# Patient Record
Sex: Male | Born: 1950 | Race: Black or African American | Hispanic: No | Marital: Married | Smoking: Former smoker
Health system: Southern US, Community
[De-identification: ages and names within clinical notes are randomized; demographics above are authoritative.]

## PROBLEM LIST (undated history)

## (undated) DIAGNOSIS — N400 Enlarged prostate without lower urinary tract symptoms: Secondary | ICD-10-CM

## (undated) DIAGNOSIS — K219 Gastro-esophageal reflux disease without esophagitis: Secondary | ICD-10-CM

## (undated) DIAGNOSIS — M109 Gout, unspecified: Secondary | ICD-10-CM

## (undated) DIAGNOSIS — F32A Depression, unspecified: Secondary | ICD-10-CM

## (undated) DIAGNOSIS — M1990 Unspecified osteoarthritis, unspecified site: Secondary | ICD-10-CM

## (undated) DIAGNOSIS — I10 Essential (primary) hypertension: Secondary | ICD-10-CM

## (undated) DIAGNOSIS — G4730 Sleep apnea, unspecified: Secondary | ICD-10-CM

## (undated) DIAGNOSIS — F329 Major depressive disorder, single episode, unspecified: Secondary | ICD-10-CM

## (undated) DIAGNOSIS — I8390 Asymptomatic varicose veins of unspecified lower extremity: Secondary | ICD-10-CM

## (undated) DIAGNOSIS — E7800 Pure hypercholesterolemia, unspecified: Secondary | ICD-10-CM

## (undated) DIAGNOSIS — C801 Malignant (primary) neoplasm, unspecified: Secondary | ICD-10-CM

## (undated) DIAGNOSIS — R42 Dizziness and giddiness: Secondary | ICD-10-CM

## (undated) HISTORY — DX: Depression, unspecified: F32.A

## (undated) HISTORY — PX: CATARACT EXTRACTION: SUR2

## (undated) HISTORY — PX: BLADDER SURGERY: SHX569

## (undated) HISTORY — DX: Gastro-esophageal reflux disease without esophagitis: K21.9

## (undated) HISTORY — PX: CARPAL TUNNEL RELEASE: SHX101

## (undated) HISTORY — DX: Major depressive disorder, single episode, unspecified: F32.9

## (undated) HISTORY — PX: COLONOSCOPY: SHX174

---

## 2003-08-19 ENCOUNTER — Emergency Department (HOSPITAL_COMMUNITY): Admission: EM | Admit: 2003-08-19 | Discharge: 2003-08-19 | Payer: Self-pay | Admitting: *Deleted

## 2012-11-11 ENCOUNTER — Emergency Department (HOSPITAL_COMMUNITY): Payer: Federal, State, Local not specified - PPO

## 2012-11-11 ENCOUNTER — Encounter (HOSPITAL_COMMUNITY): Payer: Self-pay | Admitting: *Deleted

## 2012-11-11 ENCOUNTER — Emergency Department (HOSPITAL_COMMUNITY)
Admission: EM | Admit: 2012-11-11 | Discharge: 2012-11-11 | Disposition: A | Discharge status: Home / Self Care | Payer: Federal, State, Local not specified - PPO | Attending: Emergency Medicine | Admitting: Emergency Medicine

## 2012-11-11 DIAGNOSIS — K219 Gastro-esophageal reflux disease without esophagitis: Secondary | ICD-10-CM | POA: Insufficient documentation

## 2012-11-11 DIAGNOSIS — Z862 Personal history of diseases of the blood and blood-forming organs and certain disorders involving the immune mechanism: Secondary | ICD-10-CM | POA: Insufficient documentation

## 2012-11-11 DIAGNOSIS — Z79899 Other long term (current) drug therapy: Secondary | ICD-10-CM | POA: Insufficient documentation

## 2012-11-11 DIAGNOSIS — Z8669 Personal history of other diseases of the nervous system and sense organs: Secondary | ICD-10-CM | POA: Insufficient documentation

## 2012-11-11 DIAGNOSIS — Z8639 Personal history of other endocrine, nutritional and metabolic disease: Secondary | ICD-10-CM | POA: Insufficient documentation

## 2012-11-11 DIAGNOSIS — I10 Essential (primary) hypertension: Secondary | ICD-10-CM | POA: Insufficient documentation

## 2012-11-11 DIAGNOSIS — Z87891 Personal history of nicotine dependence: Secondary | ICD-10-CM | POA: Insufficient documentation

## 2012-11-11 HISTORY — DX: Essential (primary) hypertension: I10

## 2012-11-11 HISTORY — DX: Pure hypercholesterolemia, unspecified: E78.00

## 2012-11-11 HISTORY — DX: Sleep apnea, unspecified: G47.30

## 2012-11-11 LAB — COMPREHENSIVE METABOLIC PANEL
ALT: <5 U/L (ref 0–53)
AST: 15 U/L (ref 0–37)
Albumin: 3.3 g/dL — ABNORMAL LOW (ref 3.5–5.2)
Alkaline Phosphatase: 96 U/L (ref 39–117)
BUN: 11 mg/dL (ref 6–23)
CO2: 22 mEq/L (ref 19–32)
Calcium: 9.3 mg/dL (ref 8.4–10.5)
Chloride: 100 mEq/L (ref 96–112)
Creatinine, Ser: 1.05 mg/dL (ref 0.50–1.35)
GFR calc Af Amer: 87 mL/min — ABNORMAL LOW (ref >90)
GFR calc non Af Amer: 75 mL/min — ABNORMAL LOW (ref >90)
Glucose, Bld: 111 mg/dL — ABNORMAL HIGH (ref 70–99)
Potassium: 4.4 mEq/L (ref 3.5–5.1)
Sodium: 135 mEq/L (ref 135–145)
Total Bilirubin: 0.3 mg/dL (ref 0.3–1.2)
Total Protein: 7.7 g/dL (ref 6.0–8.3)

## 2012-11-11 LAB — CBC WITH DIFFERENTIAL/PLATELET
Basophils Absolute: 0 10*3/uL (ref 0.0–0.1)
Basophils Relative: 0 % (ref 0–1)
Eosinophils Absolute: 0.2 10*3/uL (ref 0.0–0.7)
Eosinophils Relative: 1 % (ref 0–5)
HCT: 40 % (ref 39.0–52.0)
Hemoglobin: 13.7 g/dL (ref 13.0–17.0)
Lymphocytes Relative: 16 % (ref 12–46)
Lymphs Abs: 2 10*3/uL (ref 0.7–4.0)
MCH: 30 pg (ref 26.0–34.0)
MCHC: 34.3 g/dL (ref 30.0–36.0)
MCV: 87.7 fL (ref 78.0–100.0)
Monocytes Absolute: 0.9 10*3/uL (ref 0.1–1.0)
Monocytes Relative: 7 % (ref 3–12)
Neutro Abs: 9.7 10*3/uL — ABNORMAL HIGH (ref 1.7–7.7)
Neutrophils Relative %: 75 % (ref 43–77)
Platelets: 270 10*3/uL (ref 150–400)
RBC: 4.56 MIL/uL (ref 4.22–5.81)
RDW: 13.8 % (ref 11.5–15.5)
WBC: 12.9 10*3/uL — ABNORMAL HIGH (ref 4.0–10.5)

## 2012-11-11 LAB — LIPASE, BLOOD: Lipase: 46 U/L (ref 11–59)

## 2012-11-11 MED ORDER — SODIUM CHLORIDE 0.9 % IV BOLUS (SEPSIS)
500.0000 mL | Freq: Once | INTRAVENOUS | Status: AC
  Administered 2012-11-11: 500 mL via INTRAVENOUS

## 2012-11-11 MED ORDER — GI COCKTAIL ~~LOC~~
30.0000 mL | Freq: Once | ORAL | Status: AC
  Administered 2012-11-11: 30 mL via ORAL
  Filled 2012-11-11: qty 30

## 2012-11-11 MED ORDER — SUCRALFATE 1 G PO TABS: 1.0000 g | ORAL_TABLET | Freq: Four times a day (QID) | ORAL | Status: DC

## 2012-11-11 MED ORDER — PANTOPRAZOLE SODIUM 40 MG IV SOLR
40.0000 mg | Freq: Once | INTRAVENOUS | Status: AC
  Administered 2012-11-11: 40 mg via INTRAVENOUS
  Filled 2012-11-11: qty 40

## 2012-11-11 MED ORDER — FENTANYL CITRATE 0.05 MG/ML IJ SOLN
100.0000 ug | Freq: Once | INTRAMUSCULAR | Status: AC
  Administered 2012-11-11: 100 ug via INTRAVENOUS
  Filled 2012-11-11: qty 2

## 2012-11-11 MED ORDER — SUCRALFATE 1 G PO TABS
1.0000 g | ORAL_TABLET | Freq: Once | ORAL | Status: AC
  Administered 2012-11-11: 1 g via ORAL
  Filled 2012-11-11: qty 1

## 2012-11-11 MED ORDER — PANTOPRAZOLE SODIUM 20 MG PO TBEC: 40.0000 mg | DELAYED_RELEASE_TABLET | Freq: Every day | ORAL | Status: DC

## 2012-11-11 NOTE — ED Provider Notes (Signed)
CSN: [id number]     Arrival date & time 11/11/12  0208 History     First MD Initiated Contact with Patient 11/11/12 [phone]     Chief Complaint  Patient presents with  . Heartburn   (Consider location/radiation/quality/duration/timing/severity/associated sxs/prior Treatment) HPI This is a 62 year old male with a one-week history of epigastric pain that has worsened over the past 2 days. He rates the pain an 8/10 at rest but worse with movement or palpation. His pain is improved with supine, worse when sitting upright. It is exacerbated by eating or drinking anything, even water. He has not been able to eat or drink anything for the past 2 days. He took laxatives with successful purging of his bowels without relief of his pain. He describes the pain as a burning pain and states he has a history of similar pain due to gastroesophageal reflux. He has not been nauseated or vomiting.  Past Medical History  Diagnosis Date  . Hypertension   . High cholesterol   . Sleep apnea    History reviewed. No pertinent past surgical history. History reviewed. No pertinent family history. History  Substance Use Topics  . Smoking status: Former Games developer  . Smokeless tobacco: Not on file  . Alcohol Use: No    Review of Systems  All other systems reviewed and are negative.    Allergies  Review of patient's allergies indicates no known allergies.  Home Medications   Current Outpatient Rx  Name  Route  Sig  Dispense  Refill  . amLODipine-benazepril (LOTREL) 5-10 MG per capsule   Oral   Take 1 capsule by mouth daily.         Marland Kitchen FLUoxetine (PROZAC) 40 MG capsule   Oral   Take 40 mg by mouth every morning.         . furosemide (LASIX) 40 MG tablet   Oral   Take 40 mg by mouth daily.         . tamsulosin (FLOMAX) 0.4 MG CAPS capsule   Oral   Take 0.4 mg by mouth daily after supper.         . venlafaxine (EFFEXOR) 37.5 MG tablet   Oral   Take 75 mg by mouth daily.          BP  131/73  Pulse 71  Temp(Src) 98.9 F (37.2 C) (Oral)  Resp 15  SpO2 95%  Physical Exam General: Well-developed, well-nourished male in no acute distress; appearance consistent with age of record HENT: normocephalic, atraumatic Eyes: pupils equal round and reactive to light; extraocular muscles intact Neck: supple Heart: regular rate and rhythm Lungs: clear to auscultation bilaterally Abdomen: soft; nondistended; upper abdominal tenderness most prominent in the epigastrium; no masses or hepatosplenomegaly; bowel sounds present Extremities: No deformity; full range of motion Neurologic: Awake, alert and oriented; motor function intact in all extremities and symmetric; no facial droop Skin: Warm and dry Psychiatric: Normal mood and affect    ED Course   Procedures (including critical care time)    MDM   Nursing notes and vitals signs, including pulse oximetry, reviewed.  Summary of this visit's results, reviewed by myself:  Labs:  Results for orders placed during the hospital encounter of 11/11/12 (from the past 24 hour(s))  COMPREHENSIVE METABOLIC PANEL     Status: Abnormal   Collection Time    11/11/12  3:44 AM      Result Value Range   Sodium 135  135 - 145 mEq/L   Potassium  4.4  3.5 - 5.1 mEq/L   Chloride 100  96 - 112 mEq/L   CO2 22  19 - 32 mEq/L   Glucose, Bld 111 (*) 70 - 99 mg/dL   BUN 11  6 - 23 mg/dL   Creatinine, Ser 0.86  0.50 - 1.35 mg/dL   Calcium 9.3  8.4 - 57.8 mg/dL   Total Protein 7.7  6.0 - 8.3 g/dL   Albumin 3.3 (*) 3.5 - 5.2 g/dL   AST 15  0 - 37 U/L   ALT <5  0 - 53 U/L   Alkaline Phosphatase 96  39 - 117 U/L   Total Bilirubin 0.3  0.3 - 1.2 mg/dL   GFR calc non Af Amer 75 (*) >90 mL/min   GFR calc Af Amer 87 (*) >90 mL/min  LIPASE, BLOOD     Status: None   Collection Time    11/11/12  3:44 AM      Result Value Range   Lipase 46  11 - 59 U/L  CBC WITH DIFFERENTIAL     Status: Abnormal   Collection Time    11/11/12  3:44 AM      Result  Value Range   WBC 12.9 (*) 4.0 - 10.5 K/uL   RBC 4.56  4.22 - 5.81 MIL/uL   Hemoglobin 13.7  13.0 - 17.0 g/dL   HCT 46.9  62.9 - 52.8 %   MCV 87.7  78.0 - 100.0 fL   MCH 30.0  26.0 - 34.0 pg   MCHC 34.3  30.0 - 36.0 g/dL   RDW 41.3  24.4 - 01.0 %   Platelets 270  150 - 400 K/uL   Neutrophils Relative % 75  43 - 77 %   Neutro Abs 9.7 (*) 1.7 - 7.7 K/uL   Lymphocytes Relative 16  12 - 46 %   Lymphs Abs 2.0  0.7 - 4.0 K/uL   Monocytes Relative 7  3 - 12 %   Monocytes Absolute 0.9  0.1 - 1.0 K/uL   Eosinophils Relative 1  0 - 5 %   Eosinophils Absolute 0.2  0.0 - 0.7 K/uL   Basophils Relative 0  0 - 1 %   Basophils Absolute 0.0  0.0 - 0.1 K/uL    Imaging Studies: Dg Chest 2 View  11/11/2012   *RADIOLOGY REPORT*  Clinical Data: Chest pain.  CHEST - 2 VIEW  Comparison: No priors.  Findings: Lung volumes are normal.  No consolidative airspace disease.  No pleural effusions.  No pneumothorax.  No pulmonary nodule or mass noted.  Pulmonary vasculature and the cardiomediastinal silhouette are within normal limits. Atherosclerosis in the thoracic aorta.  IMPRESSION: 1. No radiographic evidence of acute cardiopulmonary disease. 2.  Atherosclerosis.   Original Report Authenticated By: Trudie Reed, M.D.   6:48 AM Patient feeling better after IV medications and sucralfate and GI cocktail. The patient's history is consistent with gastroesophageal reflux disease. He states he was supposed to have an upper endoscopy year ago but was lost to followup. We will start him on a proton pump inhibitor and Carafate and have him followup with Dr. Loreta Ave, his gastroenterologist.   Hanley Seamen, MD 11/11/12 [phone]

## 2012-11-11 NOTE — ED Notes (Signed)
Pt states that he is having pain in his epigastric region for the past week. Pt states that he cannot drink water without having pain. Pt states that the pain wraps around under his ribs.

## 2012-11-12 ENCOUNTER — Other Ambulatory Visit: Payer: Self-pay | Admitting: Gastroenterology

## 2012-11-12 DIAGNOSIS — R1013 Epigastric pain: Secondary | ICD-10-CM

## 2012-11-13 ENCOUNTER — Ambulatory Visit (HOSPITAL_COMMUNITY)
Admission: RE | Admit: 2012-11-13 | Discharge: 2012-11-13 | Disposition: A | Discharge status: Home / Self Care | Payer: Federal, State, Local not specified - PPO | Source: Ambulatory Visit | Attending: Gastroenterology | Admitting: Gastroenterology

## 2012-11-13 ENCOUNTER — Other Ambulatory Visit (HOSPITAL_COMMUNITY): Payer: Self-pay | Admitting: Cardiology

## 2012-11-13 DIAGNOSIS — R079 Chest pain, unspecified: Secondary | ICD-10-CM

## 2012-11-13 DIAGNOSIS — K8020 Calculus of gallbladder without cholecystitis without obstruction: Secondary | ICD-10-CM | POA: Insufficient documentation

## 2012-11-13 DIAGNOSIS — R1013 Epigastric pain: Secondary | ICD-10-CM | POA: Insufficient documentation

## 2012-11-14 ENCOUNTER — Encounter (INDEPENDENT_AMBULATORY_CARE_PROVIDER_SITE_OTHER): Payer: Self-pay

## 2012-11-14 ENCOUNTER — Encounter (HOSPITAL_COMMUNITY)
Admission: RE | Admit: 2012-11-14 | Discharge: 2012-11-14 | Disposition: A | Discharge status: Home / Self Care | Payer: Federal, State, Local not specified - PPO | Source: Ambulatory Visit | Attending: Cardiology | Admitting: Cardiology

## 2012-11-14 DIAGNOSIS — R079 Chest pain, unspecified: Secondary | ICD-10-CM | POA: Insufficient documentation

## 2012-11-14 LAB — BASIC METABOLIC PANEL
BUN: 11 mg/dL (ref 6–23)
CO2: 26 mEq/L (ref 19–32)
Calcium: 9.8 mg/dL (ref 8.4–10.5)
Chloride: 101 mEq/L (ref 96–112)
Creatinine, Ser: 1.08 mg/dL (ref 0.50–1.35)
GFR calc Af Amer: 84 mL/min — ABNORMAL LOW (ref >90)
GFR calc non Af Amer: 73 mL/min — ABNORMAL LOW (ref >90)
Glucose, Bld: 99 mg/dL (ref 70–99)
Potassium: 4.3 mEq/L (ref 3.5–5.1)
Sodium: 138 mEq/L (ref 135–145)

## 2012-11-14 LAB — HEPATIC FUNCTION PANEL
ALT: <5 U/L (ref 0–53)
AST: 14 U/L (ref 0–37)
Albumin: 3.6 g/dL (ref 3.5–5.2)
Alkaline Phosphatase: 96 U/L (ref 39–117)
Bilirubin, Direct: <0.1 mg/dL (ref 0.0–0.3)
Total Bilirubin: 0.2 mg/dL — ABNORMAL LOW (ref 0.3–1.2)
Total Protein: 7.5 g/dL (ref 6.0–8.3)

## 2012-11-14 LAB — LIPID PANEL
Cholesterol: 204 mg/dL — ABNORMAL HIGH (ref 0–200)
HDL: 56 mg/dL (ref >39)
LDL Cholesterol: 127 mg/dL — ABNORMAL HIGH (ref 0–99)
Total CHOL/HDL Ratio: 3.6 RATIO
Triglycerides: 107 mg/dL (ref <150)
VLDL: 21 mg/dL (ref 0–40)

## 2012-11-15 ENCOUNTER — Other Ambulatory Visit: Payer: Self-pay

## 2012-11-15 ENCOUNTER — Encounter (HOSPITAL_COMMUNITY): Payer: Self-pay | Admitting: Pharmacy Technician

## 2012-11-15 ENCOUNTER — Encounter (INDEPENDENT_AMBULATORY_CARE_PROVIDER_SITE_OTHER): Payer: Self-pay | Admitting: General Surgery

## 2012-11-15 ENCOUNTER — Encounter (HOSPITAL_COMMUNITY)
Admission: RE | Admit: 2012-11-15 | Discharge: 2012-11-15 | Disposition: A | Discharge status: Home / Self Care | Payer: Federal, State, Local not specified - PPO | Source: Ambulatory Visit | Attending: Cardiology | Admitting: Cardiology

## 2012-11-15 ENCOUNTER — Ambulatory Visit (INDEPENDENT_AMBULATORY_CARE_PROVIDER_SITE_OTHER): Payer: Federal, State, Local not specified - PPO | Admitting: General Surgery

## 2012-11-15 VITALS — BP 135/66 | HR 88

## 2012-11-15 VITALS — BP 138/82 | HR 82 | Resp 18 | Ht 67.0 in | Wt 323.0 lb

## 2012-11-15 DIAGNOSIS — K8020 Calculus of gallbladder without cholecystitis without obstruction: Secondary | ICD-10-CM

## 2012-11-15 DIAGNOSIS — R079 Chest pain, unspecified: Secondary | ICD-10-CM

## 2012-11-15 MED ORDER — TECHNETIUM TC 99M SESTAMIBI GENERIC - CARDIOLITE: 30.0000 | Freq: Once | INTRAVENOUS | Status: AC | PRN

## 2012-11-15 MED ORDER — TECHNETIUM TC 99M SESTAMIBI GENERIC - CARDIOLITE
30.0000 | Freq: Once | INTRAVENOUS | Status: AC | PRN
  Administered 2012-11-15: 30 via INTRAVENOUS

## 2012-11-15 MED ORDER — REGADENOSON 0.4 MG/5ML IV SOLN
0.4000 mg | Freq: Once | INTRAVENOUS | Status: AC
  Administered 2012-11-15: 0.4 mg via INTRAVENOUS

## 2012-11-15 NOTE — H&P (Signed)
Eric Cruz is an 62 y.o. male.   Chief Complaint: Lower chest wall pain and upper abdominal pain to the right side. HPI: The patient states that he's had this discomfort over the last several years worsening over the last 2-3 weeks. He went to the emergency room approximately 5 days ago when he was seen early in the morning for this upper abdominal and lower chest wall pain. There diagnoses at that time was ulcer disease. He was referred to his gastroenterologist and subsequently felt as though the symptoms mimic those of patients with gallbladder disease. Because of these findings the patient was sent for an ultrasound which demonstrated that the patient does have gallstones but no evidence of acute cholecystitis. With these findings the patient was referred to surgery for consideration of cholecystectomy.  The patient describes the symptoms as postprandial after things like pizza, hamburgers, and fried chicken. He describes the pain as initiating as food is going down his esophagus and seems to get hung up in the mid chest and the pain starts. This sounds more like some type of distal esophageal discomfort however there are certain foods that make it worse than others. The pain does seem to calm on the right side and occur after eating foods.  Past Medical History  Diagnosis Date  . Hypertension   . High cholesterol   . Sleep apnea   . Depression   . GERD (gastroesophageal reflux disease)     History reviewed. No pertinent past surgical history.  History reviewed. No pertinent family history. Social History:  reports that he has quit smoking. He does not have any smokeless tobacco history on file. He reports that he does not drink alcohol or use illicit drugs.  Allergies: No Known Allergies   (Not in a hospital admission)  Results for orders placed during the hospital encounter of 11/14/12 (from the past 48 hour(s))  LIPID PANEL     Status: Abnormal   Collection Time    11/14/12  12:08 PM      Result Value Range   Cholesterol 204 (*) 0 - 200 mg/dL   Triglycerides 161  <096 mg/dL   HDL 56  >04 mg/dL   Total CHOL/HDL Ratio 3.6     VLDL 21  0 - 40 mg/dL   LDL Cholesterol 540 (*) 0 - 99 mg/dL   Comment:            Total Cholesterol/HDL:CHD Risk     Coronary Heart Disease Risk Table                         Men   Women      1/2 Average Risk   3.4   3.3      Average Risk       5.0   4.4      2 X Average Risk   9.6   7.1      3 X Average Risk  23.4   11.0                Use the calculated Patient Ratio     above and the CHD Risk Table     to determine the patient's CHD Risk.                ATP III CLASSIFICATION (LDL):      <100     mg/dL   Optimal      981-191  mg/dL   Near  or Above                        Optimal      130-159  mg/dL   Borderline      782-956  mg/dL   High      >213     mg/dL   Very High  HEPATIC FUNCTION PANEL     Status: Abnormal   Collection Time    11/14/12 12:08 PM      Result Value Range   Total Protein 7.5  6.0 - 8.3 g/dL   Albumin 3.6  3.5 - 5.2 g/dL   AST 14  0 - 37 U/L   ALT <5  0 - 53 U/L   Alkaline Phosphatase 96  39 - 117 U/L   Total Bilirubin 0.2 (*) 0.3 - 1.2 mg/dL   Bilirubin, Direct <0.8  0.0 - 0.3 mg/dL   Indirect Bilirubin NOT CALCULATED  0.3 - 0.9 mg/dL  BASIC METABOLIC PANEL     Status: Abnormal   Collection Time    11/14/12 12:08 PM      Result Value Range   Sodium 138  135 - 145 mEq/L   Potassium 4.3  3.5 - 5.1 mEq/L   Chloride 101  96 - 112 mEq/L   CO2 26  19 - 32 mEq/L   Glucose, Bld 99  70 - 99 mg/dL   BUN 11  6 - 23 mg/dL   Creatinine, Ser 6.57  0.50 - 1.35 mg/dL   Calcium 9.8  8.4 - 84.6 mg/dL   GFR calc non Af Amer 73 (*) >90 mL/min   GFR calc Af Amer 84 (*) >90 mL/min   Comment: (NOTE)     The eGFR has been calculated using the CKD EPI equation.     This calculation has not been validated in all clinical situations.     eGFR's persistently <90 mL/min signify possible Chronic Kidney     Disease.    Nm Myocar Multi W/spect W/wall Motion / Ef  11/15/2012   *RADIOLOGY REPORT*  Clinical Data:  Chest pain.  MYOCARDIAL IMAGING WITH SPECT (REST AND PHARMACOLOGIC-STRESS - 2 DAY PROTOCOL) GATED LEFT VENTRICULAR WALL MOTION STUDY LEFT VENTRICULAR EJECTION FRACTION  Technique:  Standard myocardial SPECT imaging was performed after intravenous injection of 10 mCi Tc-33m sestamibi at rest.  On a different day, intravenous infusion of  regadenoson was performed under supervision of the Cardiology staff.  At peak effect of the drug, 30 mCi Tc-50m sestamibi was injected intravenously and standard myocardial SPECT imaging was performed.  Quantitative gated imaging was also performed to evaluate left ventricular wall motion and estimate left ventricular ejection fraction.  Comparison:  None.  Findings: The left ventricle myocardial perfusion is within normal limits.  There is slightly decreased uptake along the base of the inferior wall on the stress images but this is thought to be related to adjacent gut activity.  The wall motion is normal.  The calculated ejection fraction is 63%.  Estimated diastolic volume is 107 ml and estimated systolic volume is 40 ml.  IMPRESSION: No evidence for pharmacologically induced ischemia.  Calculated ejection fraction is 63%.   Original Report Authenticated By: Richarda Overlie, M.D.    Review of Systems  Constitutional: Negative for fever and chills.  HENT: Negative.   Eyes: Negative.   Respiratory: Negative.   Cardiovascular: Positive for chest pain (lower chest wall with eating). Negative for palpitations.  Gastrointestinal: Positive for heartburn,  nausea, abdominal pain and diarrhea (incontinence).  Genitourinary: Positive for urgency (incontinence).  Musculoskeletal: Negative.   Skin: Negative.   Neurological: Negative.   Endo/Heme/Allergies: Negative.   Psychiatric/Behavioral: Negative.     Blood pressure 138/82, pulse 82, resp. rate 18, height 5\' 7"  (1.702 m), weight  323 lb (146.512 kg). Physical Exam  Constitutional: He is oriented to person, place, and time. He appears well-nourished.  Obese  HENT:  Head: Normocephalic and atraumatic.  Eyes: Conjunctivae and EOM are normal. Pupils are equal, round, and reactive to light.  Neck: Normal range of motion. Neck supple.  Cardiovascular: Normal rate, regular rhythm and normal heart sounds.   Respiratory: Effort normal and breath sounds normal.  GI: Soft. Bowel sounds are normal. There is tenderness (mild RUQ tenderness).  Musculoskeletal: Normal range of motion.  Neurological: He is alert and oriented to person, place, and time. He has normal reflexes.  Skin: Skin is warm and dry.  Psychiatric: He has a normal mood and affect. His behavior is normal. Judgment and thought content normal.     Assessment/Plan Symptomatic cholelithiasis, but patient may have other problems giving him his lower chest wall pain.  It hurts when you push on his lower chest wall.  He had a stress test which is pending.  Unless his stress test comes back positive I will go ahead with Lap Chole with IOC.  He should get an EGD in the future--his last one was in 2011  Cherylynn Ridges 11/15/2012, 2:15 PM

## 2012-11-15 NOTE — Progress Notes (Signed)
This patient's office visit was dictated and recorded as a preoperative history and physical for future surgery.

## 2012-11-18 ENCOUNTER — Encounter (HOSPITAL_COMMUNITY): Payer: Federal, State, Local not specified - PPO

## 2012-11-20 NOTE — Pre-Procedure Instructions (Signed)
Eric Cruz  11/20/2012   Your procedure is scheduled on: Friday, November 22, 2012  Report to [facility] at 5:30 AM.  Call this number if you have problems the morning of surgery: [phone]   Remember:   Do not eat food or drink liquids after midnight.   Take these medicines the morning of surgery with A SIP OF WATER: FLUoxetine (PROZAC) 40 MG capsule, pantoprazole (PROTONIX) 20 MG tablet, sucralfate (CARAFATE) 1 G tablet, venlafaxine (EFFEXOR) 37.5 MG tablet     Do not wear jewelry, make-up or nail polish.  Do not wear lotions, powders, or perfumes. You may wear deodorant.  Do not shave 48 hours prior to surgery. Men may shave face and neck.  Do not bring valuables to the hospital.  [facility] is not responsible for any belongings or valuables.  Contacts, dentures or bridgework may not be worn into surgery.  Leave suitcase in the car. After surgery it may be brought to your room.  For patients admitted to the hospital, checkout time is 11:00 AM the day of discharge.   Patients discharged the day of surgery will not be allowed to drive home.    Special Instructions: Shower using CHG 2 nights before surgery and the night before surgery.  If you shower the day of surgery use CHG.  Use special wash - you have one bottle of CHG for all showers.  You should use approximately 1/3 of the bottle for each shower.   Please read over the following fact sheets that you were given: Pain Booklet, Coughing and Deep Breathing and Surgical Site Infection Prevention

## 2012-11-21 ENCOUNTER — Encounter (HOSPITAL_COMMUNITY): Payer: Self-pay

## 2012-11-21 ENCOUNTER — Encounter (HOSPITAL_COMMUNITY)
Admission: RE | Admit: 2012-11-21 | Discharge: 2012-11-21 | Disposition: A | Discharge status: Home / Self Care | Payer: Federal, State, Local not specified - PPO | Source: Ambulatory Visit | Attending: General Surgery | Admitting: General Surgery

## 2012-11-21 HISTORY — DX: Gout, unspecified: M10.9

## 2012-11-21 HISTORY — DX: Dizziness and giddiness: R42

## 2012-11-21 HISTORY — DX: Benign prostatic hyperplasia without lower urinary tract symptoms: N40.0

## 2012-11-21 HISTORY — DX: Unspecified osteoarthritis, unspecified site: M19.90

## 2012-11-21 LAB — CBC WITH DIFFERENTIAL/PLATELET
Basophils Absolute: 0 10*3/uL (ref 0.0–0.1)
Basophils Relative: 0 % (ref 0–1)
Eosinophils Absolute: 0.1 10*3/uL (ref 0.0–0.7)
Eosinophils Relative: 1 % (ref 0–5)
HCT: 38.9 % — ABNORMAL LOW (ref 39.0–52.0)
Hemoglobin: 13.2 g/dL (ref 13.0–17.0)
Lymphocytes Relative: 22 % (ref 12–46)
Lymphs Abs: 1.9 10*3/uL (ref 0.7–4.0)
MCH: 29.4 pg (ref 26.0–34.0)
MCHC: 33.9 g/dL (ref 30.0–36.0)
MCV: 86.6 fL (ref 78.0–100.0)
Monocytes Absolute: 0.7 10*3/uL (ref 0.1–1.0)
Monocytes Relative: 8 % (ref 3–12)
Neutro Abs: 5.8 10*3/uL (ref 1.7–7.7)
Neutrophils Relative %: 69 % (ref 43–77)
Platelets: 263 10*3/uL (ref 150–400)
RBC: 4.49 MIL/uL (ref 4.22–5.81)
RDW: 13.8 % (ref 11.5–15.5)
WBC: 8.4 10*3/uL (ref 4.0–10.5)

## 2012-11-21 LAB — COMPREHENSIVE METABOLIC PANEL
ALT: 5 U/L (ref 0–53)
AST: 12 U/L (ref 0–37)
Albumin: 3.4 g/dL — ABNORMAL LOW (ref 3.5–5.2)
Alkaline Phosphatase: 77 U/L (ref 39–117)
BUN: 13 mg/dL (ref 6–23)
CO2: 25 mEq/L (ref 19–32)
Calcium: 9.7 mg/dL (ref 8.4–10.5)
Chloride: 102 mEq/L (ref 96–112)
Creatinine, Ser: 0.98 mg/dL (ref 0.50–1.35)
GFR calc Af Amer: >90 mL/min (ref >90)
GFR calc non Af Amer: 88 mL/min — ABNORMAL LOW (ref >90)
Glucose, Bld: 94 mg/dL (ref 70–99)
Potassium: 3.8 mEq/L (ref 3.5–5.1)
Sodium: 137 mEq/L (ref 135–145)
Total Bilirubin: 0.2 mg/dL — ABNORMAL LOW (ref 0.3–1.2)
Total Protein: 7.5 g/dL (ref 6.0–8.3)

## 2012-11-21 MED ORDER — DEXTROSE 5 % IV SOLN: 2.0000 g | INTRAVENOUS | Status: DC

## 2012-11-21 MED ORDER — CEFOXITIN SODIUM 2 G IV SOLR
2.0000 g | INTRAVENOUS | Status: AC
  Administered 2012-11-22: 2 g via INTRAVENOUS
  Filled 2012-11-21: qty 2

## 2012-11-21 NOTE — Progress Notes (Signed)
Primary physician - Dr. Nehemiah Settle Cardiologist - dr. Sharyn Lull Stress test, ekg in epic from aug 2014

## 2012-11-22 ENCOUNTER — Encounter (HOSPITAL_COMMUNITY): Payer: Self-pay | Admitting: Anesthesiology

## 2012-11-22 ENCOUNTER — Encounter (HOSPITAL_COMMUNITY): Payer: Self-pay | Admitting: *Deleted

## 2012-11-22 ENCOUNTER — Ambulatory Visit (HOSPITAL_COMMUNITY): Payer: Federal, State, Local not specified - PPO | Admitting: Anesthesiology

## 2012-11-22 ENCOUNTER — Encounter (HOSPITAL_COMMUNITY)
Admission: RE | Disposition: A | Discharge status: Home / Self Care | Payer: Self-pay | Source: Ambulatory Visit | Attending: General Surgery

## 2012-11-22 ENCOUNTER — Ambulatory Visit (HOSPITAL_COMMUNITY)
Admission: RE | Admit: 2012-11-22 | Discharge: 2012-11-23 | Disposition: A | Discharge status: Home / Self Care | Payer: Federal, State, Local not specified - PPO | Source: Ambulatory Visit | Attending: General Surgery | Admitting: General Surgery

## 2012-11-22 DIAGNOSIS — F329 Major depressive disorder, single episode, unspecified: Secondary | ICD-10-CM | POA: Insufficient documentation

## 2012-11-22 DIAGNOSIS — G4730 Sleep apnea, unspecified: Secondary | ICD-10-CM | POA: Insufficient documentation

## 2012-11-22 DIAGNOSIS — Z01812 Encounter for preprocedural laboratory examination: Secondary | ICD-10-CM | POA: Insufficient documentation

## 2012-11-22 DIAGNOSIS — K219 Gastro-esophageal reflux disease without esophagitis: Secondary | ICD-10-CM | POA: Insufficient documentation

## 2012-11-22 DIAGNOSIS — K8010 Calculus of gallbladder with chronic cholecystitis without obstruction: Secondary | ICD-10-CM

## 2012-11-22 DIAGNOSIS — E785 Hyperlipidemia, unspecified: Secondary | ICD-10-CM | POA: Insufficient documentation

## 2012-11-22 DIAGNOSIS — F3289 Other specified depressive episodes: Secondary | ICD-10-CM | POA: Insufficient documentation

## 2012-11-22 DIAGNOSIS — K8020 Calculus of gallbladder without cholecystitis without obstruction: Secondary | ICD-10-CM

## 2012-11-22 DIAGNOSIS — I10 Essential (primary) hypertension: Secondary | ICD-10-CM | POA: Insufficient documentation

## 2012-11-22 DIAGNOSIS — Z79899 Other long term (current) drug therapy: Secondary | ICD-10-CM | POA: Insufficient documentation

## 2012-11-22 HISTORY — DX: Asymptomatic varicose veins of unspecified lower extremity: I83.90

## 2012-11-22 HISTORY — PX: CHOLECYSTECTOMY: SHX55

## 2012-11-22 SURGERY — LAPAROSCOPIC CHOLECYSTECTOMY
Anesthesia: General | Site: Abdomen | Wound class: Clean Contaminated

## 2012-11-22 MED ORDER — NEOSTIGMINE METHYLSULFATE 1 MG/ML IJ SOLN
INTRAMUSCULAR | Status: DC | PRN
  Administered 2012-11-22: 4 mg via INTRAVENOUS

## 2012-11-22 MED ORDER — TAMSULOSIN HCL 0.4 MG PO CAPS
0.4000 mg | ORAL_CAPSULE | Freq: Every day | ORAL | Status: DC
  Filled 2012-11-22: qty 1

## 2012-11-22 MED ORDER — BENAZEPRIL HCL 10 MG PO TABS
10.0000 mg | ORAL_TABLET | Freq: Every day | ORAL | Status: DC
  Filled 2012-11-22 (×2): qty 1

## 2012-11-22 MED ORDER — ONDANSETRON HCL 4 MG/2ML IJ SOLN: 4.0000 mg | Freq: Once | INTRAMUSCULAR | Status: DC | PRN

## 2012-11-22 MED ORDER — GLYCOPYRROLATE 0.2 MG/ML IJ SOLN
INTRAMUSCULAR | Status: DC | PRN
  Administered 2012-11-22: .8 mg via INTRAVENOUS

## 2012-11-22 MED ORDER — FLUOXETINE HCL 40 MG PO CAPS: 40.0000 mg | ORAL_CAPSULE | Freq: Every morning | ORAL | Status: DC

## 2012-11-22 MED ORDER — KCL IN DEXTROSE-NACL 20-5-0.45 MEQ/L-%-% IV SOLN
INTRAVENOUS | Status: DC
  Administered 2012-11-22: 12:00:00 via INTRAVENOUS
  Filled 2012-11-22 (×4): qty 1000

## 2012-11-22 MED ORDER — PANTOPRAZOLE SODIUM 40 MG PO TBEC
40.0000 mg | DELAYED_RELEASE_TABLET | Freq: Every day | ORAL | Status: DC
Start: 2012-11-22 — End: 2012-11-23
  Filled 2012-11-22 (×2): qty 1

## 2012-11-22 MED ORDER — FLUOXETINE HCL 20 MG PO CAPS
40.0000 mg | ORAL_CAPSULE | Freq: Every day | ORAL | Status: DC
  Filled 2012-11-22 (×2): qty 2

## 2012-11-22 MED ORDER — OXYCODONE-ACETAMINOPHEN 5-325 MG PO TABS: 1.0000 | ORAL_TABLET | ORAL | Status: DC | PRN

## 2012-11-22 MED ORDER — ACETAMINOPHEN 325 MG PO TABS: 650.0000 mg | ORAL_TABLET | ORAL | Status: DC | PRN

## 2012-11-22 MED ORDER — SUCRALFATE 1 G PO TABS
1.0000 g | ORAL_TABLET | Freq: Four times a day (QID) | ORAL | Status: DC
  Administered 2012-11-22 (×2): 1 g via ORAL
  Filled 2012-11-22 (×6): qty 1

## 2012-11-22 MED ORDER — OXYCODONE HCL 5 MG PO TABS: 5.0000 mg | ORAL_TABLET | Freq: Once | ORAL | Status: DC | PRN

## 2012-11-22 MED ORDER — PROPOFOL 10 MG/ML IV BOLUS
INTRAVENOUS | Status: DC | PRN
  Administered 2012-11-22: 200 mg via INTRAVENOUS

## 2012-11-22 MED ORDER — ROCURONIUM BROMIDE 100 MG/10ML IV SOLN
INTRAVENOUS | Status: DC | PRN
  Administered 2012-11-22: 50 mg via INTRAVENOUS

## 2012-11-22 MED ORDER — HYDROMORPHONE HCL PF 1 MG/ML IJ SOLN
INTRAMUSCULAR | Status: AC
  Administered 2012-11-22: 0.25 mg via INTRAVENOUS
  Filled 2012-11-22: qty 1

## 2012-11-22 MED ORDER — MIDAZOLAM HCL 5 MG/5ML IJ SOLN
INTRAMUSCULAR | Status: DC | PRN
  Administered 2012-11-22 (×2): 1 mg via INTRAVENOUS

## 2012-11-22 MED ORDER — AMLODIPINE BESY-BENAZEPRIL HCL 5-10 MG PO CAPS: 1.0000 | ORAL_CAPSULE | Freq: Every day | ORAL | Status: DC

## 2012-11-22 MED ORDER — HYDROMORPHONE HCL PF 1 MG/ML IJ SOLN
0.2500 mg | INTRAMUSCULAR | Status: DC | PRN
  Administered 2012-11-22: 0.25 mg via INTRAVENOUS

## 2012-11-22 MED ORDER — ENOXAPARIN SODIUM 40 MG/0.4ML ~~LOC~~ SOLN
40.0000 mg | SUBCUTANEOUS | Status: DC
  Administered 2012-11-23: 40 mg via SUBCUTANEOUS
  Filled 2012-11-22 (×2): qty 0.4

## 2012-11-22 MED ORDER — LIDOCAINE HCL (CARDIAC) 20 MG/ML IV SOLN
INTRAVENOUS | Status: DC | PRN
  Administered 2012-11-22: 100 mg via INTRAVENOUS

## 2012-11-22 MED ORDER — LACTATED RINGERS IV SOLN
INTRAVENOUS | Status: DC | PRN
  Administered 2012-11-22 (×2): via INTRAVENOUS

## 2012-11-22 MED ORDER — ONDANSETRON HCL 4 MG PO TABS: 4.0000 mg | ORAL_TABLET | Freq: Four times a day (QID) | ORAL | Status: DC | PRN

## 2012-11-22 MED ORDER — AMLODIPINE BESYLATE 5 MG PO TABS
5.0000 mg | ORAL_TABLET | Freq: Every day | ORAL | Status: DC
  Filled 2012-11-22 (×2): qty 1

## 2012-11-22 MED ORDER — BUPIVACAINE-EPINEPHRINE PF 0.25-1:200000 % IJ SOLN
INTRAMUSCULAR | Status: AC
  Filled 2012-11-22: qty 30

## 2012-11-22 MED ORDER — OXYCODONE HCL 5 MG/5ML PO SOLN: 5.0000 mg | Freq: Once | ORAL | Status: DC | PRN

## 2012-11-22 MED ORDER — FUROSEMIDE 40 MG PO TABS
40.0000 mg | ORAL_TABLET | Freq: Every day | ORAL | Status: DC
  Filled 2012-11-22 (×2): qty 1

## 2012-11-22 MED ORDER — 0.9 % SODIUM CHLORIDE (POUR BTL) OPTIME
TOPICAL | Status: DC | PRN
  Administered 2012-11-22: 1000 mL

## 2012-11-22 MED ORDER — VENLAFAXINE HCL 75 MG PO TABS
75.0000 mg | ORAL_TABLET | Freq: Every day | ORAL | Status: DC
  Filled 2012-11-22 (×2): qty 1

## 2012-11-22 MED ORDER — SODIUM CHLORIDE 0.9 % IR SOLN
Status: DC | PRN
  Administered 2012-11-22: 1

## 2012-11-22 MED ORDER — OXYCODONE-ACETAMINOPHEN 5-325 MG PO TABS
1.0000 | ORAL_TABLET | ORAL | Status: DC | PRN
  Administered 2012-11-22: 2 via ORAL
  Filled 2012-11-22: qty 2

## 2012-11-22 MED ORDER — LABETALOL HCL 5 MG/ML IV SOLN
INTRAVENOUS | Status: DC | PRN
  Administered 2012-11-22 (×4): 2.5 mg via INTRAVENOUS

## 2012-11-22 MED ORDER — BUPIVACAINE-EPINEPHRINE 0.25% -1:200000 IJ SOLN
INTRAMUSCULAR | Status: DC | PRN
  Administered 2012-11-22: 18 mL

## 2012-11-22 MED ORDER — ONDANSETRON HCL 4 MG/2ML IJ SOLN: 4.0000 mg | Freq: Four times a day (QID) | INTRAMUSCULAR | Status: DC | PRN

## 2012-11-22 MED ORDER — ONDANSETRON HCL 4 MG/2ML IJ SOLN
INTRAMUSCULAR | Status: DC | PRN
  Administered 2012-11-22: 4 mg via INTRAVENOUS

## 2012-11-22 MED ORDER — FENTANYL CITRATE 0.05 MG/ML IJ SOLN
INTRAMUSCULAR | Status: DC | PRN
  Administered 2012-11-22 (×7): 50 ug via INTRAVENOUS

## 2012-11-22 MED ORDER — HYDROMORPHONE HCL PF 1 MG/ML IJ SOLN: 1.0000 mg | INTRAMUSCULAR | Status: DC | PRN

## 2012-11-22 SURGICAL SUPPLY — 46 items
APPLIER CLIP 5 13 M/L LIGAMAX5 (MISCELLANEOUS) ×3
APPLIER CLIP ROT 10 11.4 M/L (STAPLE)
BLADE SURG ROTATE 9660 (MISCELLANEOUS) IMPLANT
CANISTER SUCTION 2500CC (MISCELLANEOUS) ×3 IMPLANT
CHLORAPREP W/TINT 26ML (MISCELLANEOUS) ×3 IMPLANT
CLIP APPLIE 5 13 M/L LIGAMAX5 (MISCELLANEOUS) ×2 IMPLANT
CLIP APPLIE ROT 10 11.4 M/L (STAPLE) IMPLANT
CLOTH BEACON ORANGE TIMEOUT ST (SAFETY) ×3 IMPLANT
COVER MAYO STAND STRL (DRAPES) ×3 IMPLANT
COVER SURGICAL LIGHT HANDLE (MISCELLANEOUS) ×3 IMPLANT
DECANTER SPIKE VIAL GLASS SM (MISCELLANEOUS) ×6 IMPLANT
DERMABOND ADVANCED (GAUZE/BANDAGES/DRESSINGS) ×1
DERMABOND ADVANCED .7 DNX12 (GAUZE/BANDAGES/DRESSINGS) ×2 IMPLANT
DRAPE C-ARM 42X72 X-RAY (DRAPES) ×3 IMPLANT
DRAPE UTILITY 15X26 W/TAPE STR (DRAPE) ×6 IMPLANT
ELECT REM PT RETURN 9FT ADLT (ELECTROSURGICAL) ×3
ELECTRODE REM PT RTRN 9FT ADLT (ELECTROSURGICAL) ×2 IMPLANT
GLOVE BIOGEL PI IND STRL 7.0 (GLOVE) ×4 IMPLANT
GLOVE BIOGEL PI IND STRL 7.5 (GLOVE) ×2 IMPLANT
GLOVE BIOGEL PI IND STRL 8 (GLOVE) ×2 IMPLANT
GLOVE BIOGEL PI INDICATOR 7.0 (GLOVE) ×2
GLOVE BIOGEL PI INDICATOR 7.5 (GLOVE) ×1
GLOVE BIOGEL PI INDICATOR 8 (GLOVE) ×1
GLOVE ECLIPSE 7.5 STRL STRAW (GLOVE) ×3 IMPLANT
GLOVE SURG SS PI 6.5 STRL IVOR (GLOVE) ×3 IMPLANT
GLOVE SURG SS PI 7.0 STRL IVOR (GLOVE) ×3 IMPLANT
GLOVE SURG SS PI 7.5 STRL IVOR (GLOVE) ×3 IMPLANT
GOWN STRL NON-REIN LRG LVL3 (GOWN DISPOSABLE) ×6 IMPLANT
KIT BASIN OR (CUSTOM PROCEDURE TRAY) ×3 IMPLANT
KIT ROOM TURNOVER OR (KITS) ×3 IMPLANT
NS IRRIG 1000ML POUR BTL (IV SOLUTION) ×3 IMPLANT
PAD ARMBOARD 7.5X6 YLW CONV (MISCELLANEOUS) ×6 IMPLANT
POUCH SPECIMEN RETRIEVAL 10MM (ENDOMECHANICALS) IMPLANT
SCISSORS LAP 5X35 DISP (ENDOMECHANICALS) IMPLANT
SET CHOLANGIOGRAPH 5 50 .035 (SET/KITS/TRAYS/PACK) ×3 IMPLANT
SET IRRIG TUBING LAPAROSCOPIC (IRRIGATION / IRRIGATOR) ×3 IMPLANT
SLEEVE ENDOPATH XCEL 5M (ENDOMECHANICALS) ×6 IMPLANT
SPECIMEN JAR SMALL (MISCELLANEOUS) ×3 IMPLANT
SUT MNCRL AB 4-0 PS2 18 (SUTURE) ×3 IMPLANT
TOWEL OR 17X24 6PK STRL BLUE (TOWEL DISPOSABLE) ×3 IMPLANT
TOWEL OR 17X26 10 PK STRL BLUE (TOWEL DISPOSABLE) ×3 IMPLANT
TRAY LAPAROSCOPIC (CUSTOM PROCEDURE TRAY) ×3 IMPLANT
TROCAR XCEL BLUNT TIP 100MML (ENDOMECHANICALS) ×3 IMPLANT
TROCAR XCEL NON-BLD 11X100MML (ENDOMECHANICALS) IMPLANT
TROCAR XCEL NON-BLD 5MMX100MML (ENDOMECHANICALS) ×3 IMPLANT
WATER STERILE IRR 1000ML POUR (IV SOLUTION) IMPLANT

## 2012-11-22 NOTE — Op Note (Signed)
OPERATIVE REPORT  DATE OF OPERATION: 11/22/2012  PATIENT:  Eric Cruz  62 y.o. male  PRE-OPERATIVE DIAGNOSIS:  symptomatic gallstones  POST-OPERATIVE DIAGNOSIS:  symptomatic gallstones  PROCEDURE:  Procedure(s): LAPAROSCOPIC CHOLECYSTECTOMY  SURGEON:  Surgeon(s): Cherylynn Ridges, MD  ASSISTANT: None  ANESTHESIA:   general  EBL: <20 ml  BLOOD ADMINISTERED: none  DRAINS: none   SPECIMEN:  Source of Specimen:  Gallbladder with stones  COUNTS CORRECT:  YES  PROCEDURE DETAILS: The patient was taken to the operating room and placed on the table in the supine position.  After an adequate endotracheal anesthetic was administered, he was prepped with ChloroPrep, and then draped in the usual manner exposing the entire abdomen laterally, inferiorly and up  to the costal margins.  After a proper timeout was performed including identifying the patient and the procedure to be performed, a supra-umbilical 1.5cm midline incision was made using a #15 blade.  This was taken down to the fascia which was then incised with a #15 blade.  The edges of the fascia were tented up with Kocher clamps as the preperitoneal space was penetrated with a Kelly clamp into the peritoneum.  Once this was done, a pursestring suture of 0 Vicryl was passed around the fascial opening.  This was subsequently used to secure the [facility] cannula which was passed into the peritoneal cavity.  Once the [facility] cannula was in place, carbon dioxide gas was insufflated into the peritoneal cavity up to a maximal intra-abdominal pressure of 15mm Hg.The laparoscope, with attached camera and light source, was passed into the peritoneal cavity to visualize the direct insertion of two right upper quadrant 5mm cannulas, and a sup-xiphoid 11mm cannula.  Once all cannulas were in place, the dissection was begun.  Two ratcheted graspers were attached to the dome and infundibulum of the gallbladder and retracted towards the anterior  abdominal wall and the right upper quadrant.  Using cautery attached to a dissecting forceps, the peritoneum overlaying the triangle of Chalot and the hepatoduodenal triangle was dissected away exposing the cystic duct and the cystic artery.  A clip was placed on the gallbladder side of the cystic duct, then a cholecytodochotomy made using the laparoscopic scissors. The cystic duct lumen was very small and would not allow the passage of the [facility] catheter easily.  With the patient having normal LFT, the procedure was abandoned.  The distal cystic duct was clipped multiple times then transected.  The gallbladder was then dissected out of the hepatic bed without event.  It was retrieved from the abdomen using an EndoCatch bag without event.  Once the gallbladder was removed, the bed was inspected for hemostasis.  Once excellent hemostasis was obtained all gas and fluids were aspirated from above the liver, then the cannulas were removed.  The supra-umbilical incision was closed using the pursestring suture which was in place.  0.25% bupivicaine with epinephrine was injected at all sites.  All 10mm or greater cannula sites were close using a running subcuticular stitch of 4-0 Monocryl.  5.7mm cannula sites were closed with Dermabond only.Steri-Strips and Tagaderm were used to complete the dressings at all sites.  At this point all needle, sponge, and instrument counts were correct.The patient was awakened from anesthesia and taken to the PACU in stable condition.  PATIENT DISPOSITION:  PACU - hemodynamically stable.   Cherylynn Ridges 8/22/20148:58 AM

## 2012-11-22 NOTE — Interval H&P Note (Signed)
History and Physical Interval Note:  11/22/2012 7:23 AM  Eric Cruz  has presented today for surgery, with the diagnosis of symptomatic gallstones  The various methods of treatment have been discussed with the patient and family. After consideration of risks, benefits and other options for treatment, the patient has consented to  Procedure(s): LAPAROSCOPIC CHOLECYSTECTOMY WITH INTRAOPERATIVE CHOLANGIOGRAM (N/A) as a surgical intervention .  The patient's history has been reviewed, patient examined, no change in status, stable for surgery.  I have reviewed the patient's chart and labs.  Questions were answered to the patient's satisfaction.    Patient is doing well.  Says he has had more symptoms.  Will proceed with procedure.   Fionnuala Hemmerich, Marta Lamas

## 2012-11-22 NOTE — Anesthesia Procedure Notes (Signed)
Procedure Name: Intubation Date/Time: 11/22/2012 7:51 AM Performed by: Armandina Gemma Pre-anesthesia Checklist: Patient identified, Timeout performed, Emergency Drugs available, Suction available and Patient being monitored Patient Re-evaluated:Patient Re-evaluated prior to inductionOxygen Delivery Method: Circle system utilized Preoxygenation: Pre-oxygenation with 100% oxygen Intubation Type: IV induction Ventilation: Mask ventilation without difficulty Laryngoscope Size: Miller and 2 Grade View: Grade I Tube size: 7.5 mm Number of attempts: 1 Airway Equipment and Method: Stylet Placement Confirmation: ETT inserted through vocal cords under direct vision,  positive ETCO2 and breath sounds checked- equal and bilateral Secured at: 22 cm Tube secured with: Tape Dental Injury: Teeth and Oropharynx as per pre-operative assessment  Comments: IV induction Crews intubation AMM CRNA- chipped front tooth prior to laryngoscopy- intubation atraumatic teeth and mouth as preop

## 2012-11-22 NOTE — Transfer of Care (Signed)
Immediate Anesthesia Transfer of Care Note  Patient: Eric Cruz  Procedure(s) Performed: Procedure(s): LAPAROSCOPIC CHOLECYSTECTOMY  Patient Location: PACU  Anesthesia Type:General  Level of Consciousness: sedated  Airway & Oxygen Therapy: Patient Spontanous Breathing and Patient connected to face mask oxygen  Post-op Assessment: Report given to PACU RN and Post -op Vital signs reviewed and stable  Post vital signs: Reviewed and stable  Complications: No apparent anesthesia complications

## 2012-11-22 NOTE — Discharge Summary (Signed)
Physician Discharge Summary  Patient ID: Eric Cruz MRN: [id number] DOB/AGE: 60-Sep-1952 63 y.o.  Admit date: 11/22/2012 Discharge date: 11/22/2012  Admission Diagnoses:  Discharge Diagnoses:  Active Problems:   * No active hospital problems. *   Discharged Condition: good  Hospital Course: Patient admitted after Laparoscopic cholecystectomy for cardiac monitoring and history of sleep apnea.  Surgery was unremarkable and went well. He was seen in the AM of 8/23 by Dr. Corliss Skains, he is ready for discharge. Consults: None  Significant Diagnostic Studies: None  Treatments: IV hydration, analgesia: acetaminophen and Percocet and surgery: Lap Chole  Discharge Exam: Blood pressure 109/53, pulse 71, temperature 97.7 F (36.5 C), temperature source Oral, resp. rate 18, SpO2 96.00%. General appearance: alert, cooperative, appears stated age and no distress Resp: clear to auscultation bilaterally GI: soft, non-tender; bowel sounds normal; no masses,  no organomegaly  Disposition: 01-Home or Self Care  Discharge Orders   Future Appointments Provider Department Dept Phone   12/10/2012 11:40 AM Cherylynn Ridges, MD [facility], [place] [phone]   Future Orders Complete By Expires   Call MD for:  difficulty breathing, headache or visual disturbances  As directed    Call MD for:  extreme fatigue  As directed    Call MD for:  hives  As directed    Call MD for:  persistant dizziness or light-headedness  As directed    Call MD for:  persistant nausea and vomiting  As directed    Call MD for:  redness, tenderness, or signs of infection (pain, swelling, redness, odor or green/yellow discharge around incision site)  As directed    Call MD for:  severe uncontrolled pain  As directed    Call MD for:  temperature >100.4  As directed    Diet - low sodium heart healthy  As directed    Driving Restrictions  As directed    Comments:     One week   Increase activity slowly  As directed    Leave dressing on - Keep it clean, dry, and intact until clinic visit  As directed    Lifting restrictions  As directed    Comments:     Nothing > 20 pounds for 3 weeks       Medication List         amLODipine-benazepril 5-10 MG per capsule  Commonly known as:  LOTREL  Take 1 capsule by mouth daily.     FLUoxetine 40 MG capsule  Commonly known as:  PROZAC  Take 40 mg by mouth every morning.     furosemide 40 MG tablet  Commonly known as:  LASIX  Take 40 mg by mouth daily.     oxyCODONE-acetaminophen 5-325 MG per tablet  Commonly known as:  PERCOCET/ROXICET  Take 1-2 tablets by mouth every 4 (four) hours as needed.     pantoprazole 20 MG tablet  Commonly known as:  PROTONIX  Take 2 tablets (40 mg total) by mouth daily.     sucralfate 1 G tablet  Commonly known as:  CARAFATE  Take 1 tablet (1 g total) by mouth 4 (four) times daily.     tamsulosin 0.4 MG Caps capsule  Commonly known as:  FLOMAX  Take 0.4 mg by mouth daily after supper.     venlafaxine 37.5 MG tablet  Commonly known as:  EFFEXOR  Take 75 mg by mouth daily.         Signed: Cherylynn Ridges 11/22/2012, 3:26 PM

## 2012-11-22 NOTE — H&P (View-Only) (Signed)
This patient's office visit was dictated and recorded as a preoperative history and physical for future surgery. 

## 2012-11-22 NOTE — Anesthesia Preprocedure Evaluation (Signed)
Anesthesia Evaluation  Patient identified by MRN, date of birth, ID band Patient awake    Reviewed: Allergy & Precautions, H&P , NPO status , Patient's Chart, lab work & pertinent test results  Airway Mallampati: I TM Distance: >3 FB Neck ROM: Full    Dental  (+) Teeth Intact and Dental Advisory Given   Pulmonary sleep apnea ,  breath sounds clear to auscultation        Cardiovascular hypertension, Pt. on medications Rhythm:Regular Rate:Normal     Neuro/Psych    GI/Hepatic GERD-  Medicated,  Endo/Other  Morbid obesity  Renal/GU      Musculoskeletal   Abdominal   Peds  Hematology   Anesthesia Other Findings   Reproductive/Obstetrics                           Anesthesia Physical Anesthesia Plan  ASA: III  Anesthesia Plan: General   Post-op Pain Management:    Induction: Intravenous  Airway Management Planned: Oral ETT  Additional Equipment:   Intra-op Plan:   Post-operative Plan: Extubation in OR  Informed Consent: I have reviewed the patients History and Physical, chart, labs and discussed the procedure including the risks, benefits and alternatives for the proposed anesthesia with the patient or authorized representative who has indicated his/her understanding and acceptance.   Dental advisory given  Plan Discussed with: CRNA, Anesthesiologist and Surgeon  Anesthesia Plan Comments:         Anesthesia Quick Evaluation

## 2012-11-22 NOTE — Preoperative (Signed)
Beta Blockers   Reason not to administer Beta Blockers:Not Applicable 

## 2012-11-22 NOTE — Anesthesia Postprocedure Evaluation (Signed)
  Anesthesia Post-op Note  Patient: Eric Cruz  Procedure(s) Performed: Procedure(s): LAPAROSCOPIC CHOLECYSTECTOMY  Patient Location: PACU  Anesthesia Type:General  Level of Consciousness: awake, alert  and oriented  Airway and Oxygen Therapy: Patient Spontanous Breathing and Patient connected to face mask oxygen  Post-op Pain: mild  Post-op Assessment: Post-op Vital signs reviewed  Post-op Vital Signs: Reviewed  Complications: No apparent anesthesia complications

## 2012-11-23 NOTE — Progress Notes (Signed)
Discharge later today.  Wilmon Arms. Corliss Skains, MD, [facility] Surgery  General/ Trauma Surgery  11/23/2012 9:15 AM

## 2012-11-23 NOTE — Progress Notes (Signed)
1 Day Post-Op  Subjective: Very sore, says he's been up walking.  Ready for breakfast. He says he hasn't taken anything for pain.  Objective: Vital signs in last 24 hours: Temp:  [97.4 F (36.3 C)-98.5 F (36.9 C)] 97.9 F (36.6 C) (08/23 0533) Pulse Rate:  [70-84] 70 (08/23 0533) Resp:  [12-18] 18 (08/23 0533) BP: (109-169)/(53-96) 146/71 mmHg (08/23 0533) SpO2:  [90 %-100 %] 100 % (08/23 0533) Last BM Date: 11/21/12 Regular diet, nothing PO recorded. Afebrile, VSS No labs Intake/Output from previous day: 08/22 0701 - 08/23 0700 In: 2209.3 [I.V.:2209.3] Out: 1540 [Urine:1525; Blood:15] Intake/Output this shift:    General appearance: alert, cooperative and no distress Resp: clear to auscultation bilaterally GI: tender, and sore.  No BS, no BM for a couple days, Incisions look fine.  Lab Results:   Recent Labs  11/21/12 1036  WBC 8.4  HGB 13.2  HCT 38.9*  PLT 263    BMET  Recent Labs  11/21/12 1036  NA 137  K 3.8  CL 102  CO2 25  GLUCOSE 94  BUN 13  CREATININE 0.98  CALCIUM 9.7   PT/INR No results found for this basename: LABPROT, INR,  in the last 72 hours   Recent Labs Lab 11/21/12 1036  AST 12  ALT 5  ALKPHOS 77  BILITOT 0.2*  PROT 7.5  ALBUMIN 3.4*     Lipase     Component Value Date/Time   LIPASE 46 11/11/2012 0344     Studies/Results: No results found.  Medications: . benazepril  10 mg Oral Daily   And  . amLODipine  5 mg Oral Daily  . enoxaparin (LOVENOX) injection  40 mg Subcutaneous Q24H  . FLUoxetine  40 mg Oral Daily  . furosemide  40 mg Oral Daily  . pantoprazole  40 mg Oral Daily  . sucralfate  1 g Oral QID  . tamsulosin  0.4 mg Oral QPC supper  . venlafaxine  75 mg Oral Daily   Prior to Admission medications   Medication Sig Start Date End Date Taking? Authorizing Provider  amLODipine-benazepril (LOTREL) 5-10 MG per capsule Take 1 capsule by mouth daily.   Yes Historical Provider, MD  FLUoxetine (PROZAC) 40 MG  capsule Take 40 mg by mouth every morning.    Historical Provider, MD  furosemide (LASIX) 40 MG tablet Take 40 mg by mouth daily.    Historical Provider, MD  oxyCODONE-acetaminophen (PERCOCET/ROXICET) 5-325 MG per tablet Take 1-2 tablets by mouth every 4 (four) hours as needed. 11/22/12   Cherylynn Ridges, MD  pantoprazole (PROTONIX) 20 MG tablet Take 2 tablets (40 mg total) by mouth daily. 11/11/12   John L Molpus, MD  sucralfate (CARAFATE) 1 G tablet Take 1 tablet (1 g total) by mouth 4 (four) times daily. 11/11/12   Carlisle Beers Molpus, MD  tamsulosin (FLOMAX) 0.4 MG CAPS capsule Take 0.4 mg by mouth daily after supper.    Historical Provider, MD  venlafaxine (EFFEXOR) 37.5 MG tablet Take 75 mg by mouth daily.    Historical Provider, MD     Assessment/Plan symptomatic gallstones S/p LAPAROSCOPIC CHOLECYSTECTOMY, 11/22/2012, Cherylynn Ridges, MD  Hypertension Sleep Apnea Depression GERD  Dyslipidemia  Plan;  I told him to take pain meds, eat breakfast and we can send him home around lunch time.      LOS: 1 day    Eric Cruz 11/23/2012

## 2012-11-26 ENCOUNTER — Encounter (HOSPITAL_COMMUNITY): Payer: Self-pay | Admitting: General Surgery

## 2012-12-10 ENCOUNTER — Ambulatory Visit (INDEPENDENT_AMBULATORY_CARE_PROVIDER_SITE_OTHER): Payer: Federal, State, Local not specified - PPO | Admitting: General Surgery

## 2012-12-10 ENCOUNTER — Encounter (INDEPENDENT_AMBULATORY_CARE_PROVIDER_SITE_OTHER): Payer: Self-pay | Admitting: General Surgery

## 2012-12-10 VITALS — BP 133/85 | HR 78 | Temp 97.0°F | Resp 20 | Ht 67.0 in | Wt 325.8 lb

## 2012-12-10 DIAGNOSIS — Z09 Encounter for follow-up examination after completed treatment for conditions other than malignant neoplasm: Secondary | ICD-10-CM

## 2012-12-10 NOTE — Progress Notes (Signed)
The patient is very happy with the surgical outcome. He is doing well with no evidence of infection. He is eating well. He states that he started better now than he has done in years.  His incisions have healed well with no evidence of infection. He's got good bowel sounds and no abdominal tenderness. He is to return to see me on an as-needed basis.

## 2013-01-28 ENCOUNTER — Encounter (INDEPENDENT_AMBULATORY_CARE_PROVIDER_SITE_OTHER): Payer: Self-pay

## 2015-09-23 DIAGNOSIS — I10 Essential (primary) hypertension: Secondary | ICD-10-CM | POA: Diagnosis not present

## 2015-09-23 DIAGNOSIS — N400 Enlarged prostate without lower urinary tract symptoms: Secondary | ICD-10-CM | POA: Diagnosis not present

## 2015-09-23 DIAGNOSIS — G4730 Sleep apnea, unspecified: Secondary | ICD-10-CM | POA: Diagnosis not present

## 2015-09-23 DIAGNOSIS — E7800 Pure hypercholesterolemia, unspecified: Secondary | ICD-10-CM | POA: Diagnosis not present

## 2015-10-12 DIAGNOSIS — M79609 Pain in unspecified limb: Secondary | ICD-10-CM | POA: Diagnosis not present

## 2015-10-12 DIAGNOSIS — B351 Tinea unguium: Secondary | ICD-10-CM | POA: Diagnosis not present

## 2015-10-12 DIAGNOSIS — B353 Tinea pedis: Secondary | ICD-10-CM | POA: Diagnosis not present

## 2016-04-13 DIAGNOSIS — Z125 Encounter for screening for malignant neoplasm of prostate: Secondary | ICD-10-CM | POA: Diagnosis not present

## 2016-04-13 DIAGNOSIS — Z0000 Encounter for general adult medical examination without abnormal findings: Secondary | ICD-10-CM | POA: Diagnosis not present

## 2016-04-13 DIAGNOSIS — I10 Essential (primary) hypertension: Secondary | ICD-10-CM | POA: Diagnosis not present

## 2016-10-12 DIAGNOSIS — E7800 Pure hypercholesterolemia, unspecified: Secondary | ICD-10-CM | POA: Diagnosis not present

## 2016-10-12 DIAGNOSIS — I10 Essential (primary) hypertension: Secondary | ICD-10-CM | POA: Diagnosis not present

## 2016-10-12 DIAGNOSIS — R972 Elevated prostate specific antigen [PSA]: Secondary | ICD-10-CM | POA: Diagnosis not present

## 2016-10-12 DIAGNOSIS — N400 Enlarged prostate without lower urinary tract symptoms: Secondary | ICD-10-CM | POA: Diagnosis not present

## 2016-10-26 DIAGNOSIS — N401 Enlarged prostate with lower urinary tract symptoms: Secondary | ICD-10-CM | POA: Diagnosis not present

## 2016-10-26 DIAGNOSIS — N3000 Acute cystitis without hematuria: Secondary | ICD-10-CM | POA: Diagnosis not present

## 2016-10-26 DIAGNOSIS — N318 Other neuromuscular dysfunction of bladder: Secondary | ICD-10-CM | POA: Diagnosis not present

## 2016-10-31 DIAGNOSIS — N401 Enlarged prostate with lower urinary tract symptoms: Secondary | ICD-10-CM | POA: Diagnosis not present

## 2016-10-31 DIAGNOSIS — C61 Malignant neoplasm of prostate: Secondary | ICD-10-CM | POA: Diagnosis not present

## 2016-10-31 DIAGNOSIS — R972 Elevated prostate specific antigen [PSA]: Secondary | ICD-10-CM | POA: Diagnosis not present

## 2016-11-07 DIAGNOSIS — C61 Malignant neoplasm of prostate: Secondary | ICD-10-CM | POA: Diagnosis not present

## 2017-02-06 DIAGNOSIS — C61 Malignant neoplasm of prostate: Secondary | ICD-10-CM | POA: Diagnosis not present

## 2017-02-06 DIAGNOSIS — R972 Elevated prostate specific antigen [PSA]: Secondary | ICD-10-CM | POA: Diagnosis not present

## 2017-02-06 DIAGNOSIS — N3020 Other chronic cystitis without hematuria: Secondary | ICD-10-CM | POA: Diagnosis not present

## 2017-03-13 DIAGNOSIS — K080 Exfoliation of teeth due to systemic causes: Secondary | ICD-10-CM | POA: Diagnosis not present

## 2017-04-30 DIAGNOSIS — N400 Enlarged prostate without lower urinary tract symptoms: Secondary | ICD-10-CM | POA: Diagnosis not present

## 2017-04-30 DIAGNOSIS — E7800 Pure hypercholesterolemia, unspecified: Secondary | ICD-10-CM | POA: Diagnosis not present

## 2017-04-30 DIAGNOSIS — G4730 Sleep apnea, unspecified: Secondary | ICD-10-CM | POA: Diagnosis not present

## 2017-04-30 DIAGNOSIS — I10 Essential (primary) hypertension: Secondary | ICD-10-CM | POA: Diagnosis not present

## 2017-04-30 DIAGNOSIS — Z0000 Encounter for general adult medical examination without abnormal findings: Secondary | ICD-10-CM | POA: Diagnosis not present

## 2017-05-08 DIAGNOSIS — N318 Other neuromuscular dysfunction of bladder: Secondary | ICD-10-CM | POA: Diagnosis not present

## 2017-05-08 DIAGNOSIS — N3020 Other chronic cystitis without hematuria: Secondary | ICD-10-CM | POA: Diagnosis not present

## 2017-05-08 DIAGNOSIS — N401 Enlarged prostate with lower urinary tract symptoms: Secondary | ICD-10-CM | POA: Diagnosis not present

## 2017-05-18 DIAGNOSIS — C61 Malignant neoplasm of prostate: Secondary | ICD-10-CM | POA: Diagnosis not present

## 2017-05-30 DIAGNOSIS — L738 Other specified follicular disorders: Secondary | ICD-10-CM | POA: Diagnosis not present

## 2017-05-31 DIAGNOSIS — C61 Malignant neoplasm of prostate: Secondary | ICD-10-CM | POA: Diagnosis not present

## 2017-05-31 DIAGNOSIS — L739 Follicular disorder, unspecified: Secondary | ICD-10-CM | POA: Diagnosis not present

## 2017-05-31 DIAGNOSIS — L0292 Furuncle, unspecified: Secondary | ICD-10-CM | POA: Diagnosis not present

## 2017-05-31 DIAGNOSIS — D72829 Elevated white blood cell count, unspecified: Secondary | ICD-10-CM | POA: Diagnosis not present

## 2017-06-01 ENCOUNTER — Other Ambulatory Visit: Payer: Self-pay | Admitting: Internal Medicine

## 2017-06-01 DIAGNOSIS — L0292 Furuncle, unspecified: Secondary | ICD-10-CM

## 2017-06-01 DIAGNOSIS — D72829 Elevated white blood cell count, unspecified: Secondary | ICD-10-CM

## 2017-06-01 DIAGNOSIS — L739 Follicular disorder, unspecified: Secondary | ICD-10-CM

## 2017-06-07 ENCOUNTER — Other Ambulatory Visit: Payer: Self-pay

## 2017-06-07 ENCOUNTER — Ambulatory Visit (HOSPITAL_COMMUNITY): Payer: Federal, State, Local not specified - PPO | Attending: Cardiology

## 2017-06-07 DIAGNOSIS — G4733 Obstructive sleep apnea (adult) (pediatric): Secondary | ICD-10-CM | POA: Diagnosis not present

## 2017-06-07 DIAGNOSIS — Z79899 Other long term (current) drug therapy: Secondary | ICD-10-CM | POA: Diagnosis not present

## 2017-06-07 DIAGNOSIS — L739 Follicular disorder, unspecified: Secondary | ICD-10-CM

## 2017-06-07 DIAGNOSIS — I10 Essential (primary) hypertension: Secondary | ICD-10-CM | POA: Insufficient documentation

## 2017-06-07 DIAGNOSIS — Z8249 Family history of ischemic heart disease and other diseases of the circulatory system: Secondary | ICD-10-CM | POA: Insufficient documentation

## 2017-06-07 DIAGNOSIS — C61 Malignant neoplasm of prostate: Secondary | ICD-10-CM | POA: Diagnosis not present

## 2017-06-07 DIAGNOSIS — Z6841 Body Mass Index (BMI) 40.0 and over, adult: Secondary | ICD-10-CM | POA: Insufficient documentation

## 2017-06-07 DIAGNOSIS — E669 Obesity, unspecified: Secondary | ICD-10-CM | POA: Diagnosis not present

## 2017-06-07 DIAGNOSIS — Z9049 Acquired absence of other specified parts of digestive tract: Secondary | ICD-10-CM | POA: Insufficient documentation

## 2017-06-07 DIAGNOSIS — E785 Hyperlipidemia, unspecified: Secondary | ICD-10-CM | POA: Diagnosis not present

## 2017-06-07 DIAGNOSIS — Z87891 Personal history of nicotine dependence: Secondary | ICD-10-CM | POA: Diagnosis not present

## 2017-06-07 DIAGNOSIS — L0292 Furuncle, unspecified: Secondary | ICD-10-CM

## 2017-06-07 DIAGNOSIS — Z833 Family history of diabetes mellitus: Secondary | ICD-10-CM | POA: Diagnosis not present

## 2017-06-07 DIAGNOSIS — D72829 Elevated white blood cell count, unspecified: Secondary | ICD-10-CM | POA: Insufficient documentation

## 2017-06-07 MED ORDER — PERFLUTREN LIPID MICROSPHERE
1.0000 mL | INTRAVENOUS | Status: AC | PRN
  Administered 2017-06-07: 2 mL via INTRAVENOUS

## 2017-06-08 DIAGNOSIS — R972 Elevated prostate specific antigen [PSA]: Secondary | ICD-10-CM | POA: Diagnosis not present

## 2017-06-15 DIAGNOSIS — L739 Follicular disorder, unspecified: Secondary | ICD-10-CM | POA: Diagnosis not present

## 2017-06-18 DIAGNOSIS — C61 Malignant neoplasm of prostate: Secondary | ICD-10-CM | POA: Diagnosis not present

## 2017-09-07 DIAGNOSIS — R350 Frequency of micturition: Secondary | ICD-10-CM | POA: Diagnosis not present

## 2017-09-07 DIAGNOSIS — R21 Rash and other nonspecific skin eruption: Secondary | ICD-10-CM | POA: Diagnosis not present

## 2017-09-11 DIAGNOSIS — R21 Rash and other nonspecific skin eruption: Secondary | ICD-10-CM | POA: Diagnosis not present

## 2017-09-11 DIAGNOSIS — R350 Frequency of micturition: Secondary | ICD-10-CM | POA: Diagnosis not present

## 2017-10-29 DIAGNOSIS — N400 Enlarged prostate without lower urinary tract symptoms: Secondary | ICD-10-CM | POA: Diagnosis not present

## 2017-10-29 DIAGNOSIS — L739 Follicular disorder, unspecified: Secondary | ICD-10-CM | POA: Diagnosis not present

## 2017-10-29 DIAGNOSIS — Z6841 Body Mass Index (BMI) 40.0 and over, adult: Secondary | ICD-10-CM | POA: Diagnosis not present

## 2017-10-29 DIAGNOSIS — R21 Rash and other nonspecific skin eruption: Secondary | ICD-10-CM | POA: Diagnosis not present

## 2017-11-16 DIAGNOSIS — L739 Follicular disorder, unspecified: Secondary | ICD-10-CM | POA: Diagnosis not present

## 2017-11-16 DIAGNOSIS — R21 Rash and other nonspecific skin eruption: Secondary | ICD-10-CM | POA: Diagnosis not present

## 2017-12-18 DIAGNOSIS — N318 Other neuromuscular dysfunction of bladder: Secondary | ICD-10-CM | POA: Diagnosis not present

## 2017-12-18 DIAGNOSIS — N3020 Other chronic cystitis without hematuria: Secondary | ICD-10-CM | POA: Diagnosis not present

## 2017-12-18 DIAGNOSIS — C61 Malignant neoplasm of prostate: Secondary | ICD-10-CM | POA: Diagnosis not present

## 2018-01-10 ENCOUNTER — Emergency Department (HOSPITAL_BASED_OUTPATIENT_CLINIC_OR_DEPARTMENT_OTHER)
Admission: EM | Admit: 2018-01-10 | Discharge: 2018-01-11 | Disposition: A | Discharge status: Home / Self Care | Payer: Federal, State, Local not specified - PPO | Attending: Emergency Medicine | Admitting: Emergency Medicine

## 2018-01-10 ENCOUNTER — Emergency Department (HOSPITAL_BASED_OUTPATIENT_CLINIC_OR_DEPARTMENT_OTHER): Payer: Federal, State, Local not specified - PPO

## 2018-01-10 ENCOUNTER — Encounter (HOSPITAL_BASED_OUTPATIENT_CLINIC_OR_DEPARTMENT_OTHER): Payer: Self-pay | Admitting: *Deleted

## 2018-01-10 ENCOUNTER — Other Ambulatory Visit: Payer: Self-pay

## 2018-01-10 DIAGNOSIS — R079 Chest pain, unspecified: Secondary | ICD-10-CM | POA: Diagnosis not present

## 2018-01-10 DIAGNOSIS — M7989 Other specified soft tissue disorders: Secondary | ICD-10-CM | POA: Diagnosis not present

## 2018-01-10 DIAGNOSIS — I10 Essential (primary) hypertension: Secondary | ICD-10-CM | POA: Insufficient documentation

## 2018-01-10 DIAGNOSIS — S299XXA Unspecified injury of thorax, initial encounter: Secondary | ICD-10-CM | POA: Diagnosis not present

## 2018-01-10 DIAGNOSIS — S6991XA Unspecified injury of right wrist, hand and finger(s), initial encounter: Secondary | ICD-10-CM | POA: Diagnosis not present

## 2018-01-10 DIAGNOSIS — Z79899 Other long term (current) drug therapy: Secondary | ICD-10-CM | POA: Insufficient documentation

## 2018-01-10 DIAGNOSIS — R52 Pain, unspecified: Secondary | ICD-10-CM

## 2018-01-10 DIAGNOSIS — M542 Cervicalgia: Secondary | ICD-10-CM | POA: Diagnosis not present

## 2018-01-10 DIAGNOSIS — F17200 Nicotine dependence, unspecified, uncomplicated: Secondary | ICD-10-CM | POA: Insufficient documentation

## 2018-01-10 DIAGNOSIS — M4802 Spinal stenosis, cervical region: Secondary | ICD-10-CM

## 2018-01-10 DIAGNOSIS — R509 Fever, unspecified: Secondary | ICD-10-CM | POA: Diagnosis not present

## 2018-01-10 DIAGNOSIS — M79642 Pain in left hand: Secondary | ICD-10-CM | POA: Diagnosis not present

## 2018-01-10 DIAGNOSIS — S6992XA Unspecified injury of left wrist, hand and finger(s), initial encounter: Secondary | ICD-10-CM | POA: Diagnosis not present

## 2018-01-10 DIAGNOSIS — M79641 Pain in right hand: Secondary | ICD-10-CM | POA: Diagnosis not present

## 2018-01-10 DIAGNOSIS — M609 Myositis, unspecified: Secondary | ICD-10-CM | POA: Diagnosis not present

## 2018-01-10 LAB — TROPONIN I
Troponin I: <0.03 ng/mL (ref <0.03)
Troponin I: <0.03 ng/mL (ref <0.03)

## 2018-01-10 LAB — BASIC METABOLIC PANEL
Anion gap: 10 (ref 5–15)
BUN: 15 mg/dL (ref 8–23)
CO2: 25 mmol/L (ref 22–32)
Calcium: 9.2 mg/dL (ref 8.9–10.3)
Chloride: 100 mmol/L (ref 98–111)
Creatinine, Ser: 1.09 mg/dL (ref 0.61–1.24)
GFR calc Af Amer: >60 mL/min (ref >60)
GFR calc non Af Amer: >60 mL/min (ref >60)
Glucose, Bld: 103 mg/dL — ABNORMAL HIGH (ref 70–99)
Potassium: 4.2 mmol/L (ref 3.5–5.1)
Sodium: 135 mmol/L (ref 135–145)

## 2018-01-10 LAB — CBC
HCT: 34.7 % — ABNORMAL LOW (ref 39.0–52.0)
Hemoglobin: 11 g/dL — ABNORMAL LOW (ref 13.0–17.0)
MCH: 28.8 pg (ref 26.0–34.0)
MCHC: 31.7 g/dL (ref 30.0–36.0)
MCV: 90.8 fL (ref 80.0–100.0)
Platelets: 349 10*3/uL (ref 150–400)
RBC: 3.82 MIL/uL — ABNORMAL LOW (ref 4.22–5.81)
RDW: 14.7 % (ref 11.5–15.5)
WBC: 14.2 10*3/uL — ABNORMAL HIGH (ref 4.0–10.5)
nRBC: 0 % (ref 0.0–0.2)

## 2018-01-10 MED ORDER — DEXAMETHASONE SODIUM PHOSPHATE 10 MG/ML IJ SOLN
10.0000 mg | Freq: Once | INTRAMUSCULAR | Status: AC
  Administered 2018-01-10: 10 mg via INTRAVENOUS
  Filled 2018-01-10: qty 1

## 2018-01-10 MED ORDER — DIAZEPAM 5 MG/ML IJ SOLN
5.0000 mg | Freq: Once | INTRAMUSCULAR | Status: AC
  Administered 2018-01-10: 5 mg via INTRAVENOUS
  Filled 2018-01-10: qty 2

## 2018-01-10 MED ORDER — CYCLOBENZAPRINE HCL 10 MG PO TABS
10.0000 mg | ORAL_TABLET | Freq: Once | ORAL | Status: AC
  Administered 2018-01-10: 10 mg via ORAL
  Filled 2018-01-10: qty 1

## 2018-01-10 MED ORDER — MORPHINE SULFATE (PF) 4 MG/ML IV SOLN
4.0000 mg | Freq: Once | INTRAVENOUS | Status: AC
  Administered 2018-01-10: 4 mg via INTRAVENOUS
  Filled 2018-01-10: qty 1

## 2018-01-10 MED ORDER — SODIUM CHLORIDE 0.9 % IV BOLUS
500.0000 mL | Freq: Once | INTRAVENOUS | Status: AC
Start: 2018-01-10 — End: 2018-01-10
  Administered 2018-01-10: 500 mL via INTRAVENOUS

## 2018-01-10 MED ORDER — MORPHINE SULFATE (PF) 2 MG/ML IV SOLN
2.0000 mg | Freq: Once | INTRAVENOUS | Status: AC
  Administered 2018-01-10: 2 mg via INTRAVENOUS
  Filled 2018-01-10: qty 1

## 2018-01-10 NOTE — ED Notes (Signed)
Attempted report to [facility] x1, will call back.

## 2018-01-10 NOTE — ED Provider Notes (Signed)
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  Patient reports pain radiating from his neck down his arms and into his hands, with weakness and pain in his bilateral hands, and severe pain with neck movements.  Of note, patient also reported pain with swallowing.  EKG was done within normal limits, 2 troponins negative, have low suspicion for ACS.  Patient has strong pulses, no sign of mediastinal widening on x-ray, and history is not consistent with dissection or PE.  Do not feel swelling in his bilateral hands is consistent with DVT.  He has strong pulses bilaterally, no sign of arterial thrombus.  Largest concern I have at this time is for history of patient's radicular pain with apparent weakness and paresthesias on exam, concern for possible central cord syndrome or other significant cervical nerve impingement.  Will give patient Decadron and Valium.  Will transfer to [facility] for MRI cervical spine.     Gareth Morgan, MD 01/10/18 2236

## 2018-01-10 NOTE — ED Notes (Signed)
Lifted steel 12 days ago  Then started having back , shoulder pain radiating to arms and hands,  w hand swelling and redness  Tight feeling in throat

## 2018-01-10 NOTE — ED Notes (Signed)
Patient transported to X-ray 

## 2018-01-10 NOTE — ED Triage Notes (Signed)
Pt sent here from [facility] for eval joint swelling x 2 weeks and fever

## 2018-01-10 NOTE — ED Notes (Signed)
Pt reporting no change in pain

## 2018-01-10 NOTE — ED Notes (Signed)
[facility] notified Maudie Mercury) - patient to be transported to ED for MRI

## 2018-01-10 NOTE — ED Provider Notes (Signed)
[facility] EMERGENCY DEPARTMENT Provider Note   CSN: [id number] Arrival date & time: 01/10/18  1733     History   Chief Complaint Chief Complaint  Patient presents with  . Joint Swelling    HPI Eric Cruz is a 67 y.o. male presenting for 2 weeks of bilateral hand pain.  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  Patient states that his hands gradually increased and swelling and pain over the next day with mild erythema to the left hand.  Patient describes his pain is a sharp severe pain that is constant and worsened with any movement of the hand or touching of the hand.  Patient states that he has been using [employer] on his hands without relief. Patient states that pain occasionally radiates from one hand to the other hand.  Additionally during examination patient mentions that he has been having a mild sharp pain to the center of his chest whenever he takes a deep breath or swallows.  States that this has been intermittent for the past 3 days.  Patient states that the pain is only present when he takes a very deep breath.  Of note patient with gout listed on his past medical history however he adamantly denies ever being diagnosed or treated for gout. HPI  Past Medical History:  Diagnosis Date  . Arthritis   . BPH (benign prostatic hyperplasia)   . Depression   . Dizziness   . GERD (gastroesophageal reflux disease)   . Gout   . High cholesterol   . Hypertension   . Sleep apnea    has cpap - not currently wearing  . Varicose veins     Patient Active Problem List   Diagnosis Date Noted  . Postop check 12/10/2012  . Symptomatic cholelithiasis 11/15/2012    Past Surgical History:  Procedure Laterality Date  . BLADDER SURGERY    . CHOLECYSTECTOMY  11/22/2012  . CHOLECYSTECTOMY  11/22/2012   Procedure: LAPAROSCOPIC CHOLECYSTECTOMY;  Surgeon: Gwenyth Ober, MD;  Location: [facility];   Service: General;;        Home Medications    Prior to Admission medications   Medication Sig Start Date End Date Taking? Authorizing Provider  amLODipine-benazepril (LOTREL) 5-10 MG per capsule Take 1 capsule by mouth daily.    [provider]  FLUoxetine (PROZAC) 40 MG capsule Take 40 mg by mouth every morning.    [provider]  furosemide (LASIX) 40 MG tablet Take 40 mg by mouth daily.    [provider]  oxyCODONE-acetaminophen (PERCOCET/ROXICET) 5-325 MG per tablet Take 1-2 tablets by mouth every 4 (four) hours as needed. 11/22/12   Judeth Horn, MD  pantoprazole (PROTONIX) 20 MG tablet Take 2 tablets (40 mg total) by mouth daily. 11/11/12   Molpus, John, MD  sucralfate (CARAFATE) 1 G tablet Take 1 tablet (1 g total) by mouth 4 (four) times daily. 11/11/12   Molpus, John, MD  tamsulosin (FLOMAX) 0.4 MG CAPS capsule Take 0.4 mg by mouth daily after supper.    [provider]  venlafaxine (EFFEXOR) 37.5 MG tablet Take 75 mg by mouth daily.    [provider]    Family History History reviewed. No pertinent family history.  Social History Social History   Tobacco Use  . Smoking status: Former Smoker    Last attempt to quit: 04/22/1998    Years since quitting: 19.7  .  Smokeless tobacco: Never Used  Substance Use Topics  . Alcohol use: No  . Drug use: No     Allergies   Patient has no known allergies.   Review of Systems Review of Systems  Constitutional: Negative.  Negative for chills and fever.  HENT: Negative.  Negative for rhinorrhea and sore throat.   Eyes: Negative.  Negative for visual disturbance.  Respiratory: Negative.  Negative for cough and shortness of breath.   Cardiovascular: Positive for chest pain. Negative for leg swelling.  Gastrointestinal: Negative.  Negative for abdominal pain, blood in stool, diarrhea, nausea and vomiting.  Genitourinary: Negative.  Negative for dysuria and hematuria.    Musculoskeletal: Positive for arthralgias and joint swelling. Negative for myalgias.  Skin: Positive for color change. Negative for rash.  Neurological: Negative.  Negative for dizziness, syncope, weakness and headaches.   Physical Exam Updated Vital Signs BP 123/85   Pulse 99   Temp 98.9 F (37.2 C) (Oral)   Resp 18   Ht 5\' 7"  (1.702 m)   Wt (!) 154.7 kg   SpO2 96%   BMI 53.41 kg/m   Physical Exam  Constitutional: He is oriented to person, place, and time. He appears well-developed and well-nourished. No distress.  Obese  HENT:  Head: Normocephalic and atraumatic.  Right Ear: External ear normal.  Left Ear: External ear normal.  Nose: Nose normal.  Eyes: Pupils are equal, round, and reactive to light. EOM are normal.  Neck: Trachea normal and normal range of motion. No tracheal deviation present.  Cardiovascular: Normal rate, regular rhythm, normal heart sounds and intact distal pulses.  Pulses:      Radial pulses are 2+ on the right side, and 2+ on the left side.       Dorsalis pedis pulses are 2+ on the right side, and 2+ on the left side.       Posterior tibial pulses are 2+ on the right side, and 2+ on the left side.  Pulmonary/Chest: Effort normal and breath sounds normal. No respiratory distress. He has no wheezes. He exhibits no tenderness and no deformity.  Abdominal: Soft. Bowel sounds are normal. There is no tenderness. There is no rigidity, no rebound and no guarding.  Musculoskeletal: Normal range of motion. He exhibits tenderness.       Right wrist: He exhibits tenderness. He exhibits normal range of motion and no swelling.       Left wrist: He exhibits tenderness. He exhibits normal range of motion and no swelling.       Right hand: He exhibits tenderness. He exhibits normal capillary refill, no deformity and no swelling. Normal sensation noted.       Left hand: He exhibits tenderness and swelling. He exhibits normal capillary refill and no deformity. Normal  sensation noted.       Right lower leg: Normal.       Left lower leg: Normal.       Right foot: Normal.       Left foot: Normal.  Left hand: No gross deformities, skin intact. Fingers with mild swelling, difficult to assess due to body habitus. Mild erythema along dorsal sides of 3rd and 4th digits.  Tenderness to palpation over entire hand however primarily over all MCP joints.  Patient refusing to move fingers due to pain.  ROM to flexion/extension at wrist.  Radial artery 2+ with <2sec cap refill in all fingers.  Sensation intact to light-tough in median/ulnar/radial distributions.   Right hand: No gross  deformities, skin intact. Fingers with mild swelling difficult to assess due to body habitus.  Tenderness to palpation over entire hand however primarily over MCP joints..  Patient refuses to move fingers due to pain. Full active and resisted ROM to flexion/extension at wrist..  Radial artery 2+ with <2sec cap refill in all fingers.  Sensation intact to light-tough in median/ulnar/radial distributions.   Feet:  Right Foot:  Protective Sensation: 3 sites tested. 3 sites sensed.  Left Foot:  Protective Sensation: 3 sites tested. 3 sites sensed.  Neurological: He is alert and oriented to person, place, and time. No sensory deficit. GCS eye subscore is 4. GCS verbal subscore is 5. GCS motor subscore is 6.  Speech is clear and goal oriented, follows commands Major Cranial nerves without deficit, no facial droop Normal strength in upper and lower extremities bilaterally including dorsiflexion and plantar flexion, patient reluctant to test grip due to pain. Sensation normal to light touch Moves extremities without ataxia, coordination intact  Skin: Skin is warm and dry.     Psychiatric: He has a normal mood and affect. His behavior is normal.     ED Treatments / Results  Labs (all labs ordered are listed, but only abnormal results are displayed) Labs Reviewed  BASIC METABOLIC  PANEL - Abnormal; Notable for the following components:      Result Value   Glucose, Bld 103 (*)    All other components within normal limits  CBC - Abnormal; Notable for the following components:   WBC 14.2 (*)    RBC 3.82 (*)    Hemoglobin 11.0 (*)    HCT 34.7 (*)    All other components within normal limits  TROPONIN I    EKG EKG Interpretation  Date/Time:  Thursday January 10 2018 19:14:09 EDT Ventricular Rate:  84 PR Interval:    QRS Duration: 81 QT Interval:  360 QTC Calculation: 426 R Axis:   19 Text Interpretation:  Sinus rhythm No significant change since last tracing Confirmed by Gareth Morgan [phone]) on 01/10/2018 8:22:42 PM   Radiology No results found.  Procedures Procedures (including critical care time)  Medications Ordered in ED Medications  morphine 4 MG/ML injection 4 mg (has no administration in time range)  sodium chloride 0.9 % bolus 500 mL (500 mLs Intravenous New Bag/Given 01/10/18 1910)  morphine 2 MG/ML injection 2 mg (2 mg Intravenous Given 01/10/18 1918)     Initial Impression / Assessment and Plan / ED Course  I have reviewed the triage vital signs and the nursing notes.  Pertinent labs & imaging results that were available during my care of the patient were reviewed by me and considered in my medical decision making (see chart for details).  Clinical Course as of Jan 11 2131  Thu Jan 10, 2018  2023 Dr. Billy Fischer to see   [BM]  2048 Patient reevaluated, denying chest pain. Only endorsing bilateral hand pain.   [BM]    Clinical Course User Index [BM] Deliah Boston, PA-C   67 year old male presenting for 2 weeks of bilateral hand pain, pain appears out of proportion, differential includes but not limited to gout vs infection.    Additionally with intermittent chest pain.  Plain films of bilateral hands negative Chest xray negative for acute findings. CBC shows elevated white blood cell count of 14.2 BMP nonacute Initial  troponin negative Second troponin to be collected 3 hours after initial  Patient afebrile, not tachycardic, not hypotensive well-appearing and in no acute distress.  Not tachypneic and not hypoxic on room air.  Patient reevaluated and only endorsing bilateral hand pain, denying chest pain upon reevaluation.   Patient's case has been discussed with Dr. Billy Fischer. Care handoff has been given to Dr. Billy Fischer at shift change.    Note: Portions of this report may have been transcribed using voice recognition software. Every effort was made to ensure accuracy; however, inadvertent computerized transcription errors may still be present.  Final Clinical Impressions(s) / ED Diagnoses   Final diagnoses:  Pain    ED Discharge Orders    None       Gari Crown 01/10/18 2138    Gareth Morgan, MD 01/11/18 1012

## 2018-01-11 ENCOUNTER — Emergency Department (HOSPITAL_COMMUNITY): Payer: Federal, State, Local not specified - PPO

## 2018-01-11 DIAGNOSIS — F17200 Nicotine dependence, unspecified, uncomplicated: Secondary | ICD-10-CM | POA: Diagnosis not present

## 2018-01-11 DIAGNOSIS — M542 Cervicalgia: Secondary | ICD-10-CM | POA: Diagnosis not present

## 2018-01-11 DIAGNOSIS — M4802 Spinal stenosis, cervical region: Secondary | ICD-10-CM | POA: Diagnosis not present

## 2018-01-11 DIAGNOSIS — Z79899 Other long term (current) drug therapy: Secondary | ICD-10-CM | POA: Diagnosis not present

## 2018-01-11 DIAGNOSIS — I10 Essential (primary) hypertension: Secondary | ICD-10-CM | POA: Diagnosis not present

## 2018-01-11 MED ORDER — HYDROCODONE-ACETAMINOPHEN 5-325 MG PO TABS: 1.0000 | ORAL_TABLET | Freq: Four times a day (QID) | ORAL | 0 refills | Status: DC | PRN

## 2018-01-11 MED ORDER — FENTANYL CITRATE (PF) 100 MCG/2ML IJ SOLN
100.0000 ug | Freq: Once | INTRAMUSCULAR | Status: AC
  Administered 2018-01-11: 100 ug via INTRAVENOUS
  Filled 2018-01-11: qty 2

## 2018-01-11 MED ORDER — PREDNISONE 50 MG PO TABS: ORAL_TABLET | ORAL | 0 refills | Status: DC

## 2018-01-11 NOTE — Discharge Instructions (Signed)
You have neck pain, possibly from a cervical strain and/or pinched nerve.  ° °SEEK IMMEDIATE MEDICAL ATTENTION IF: °You develop difficulties swallowing or breathing.  °You have new or worse numbness, weakness, tingling, or movement problems in your arms or legs.  °You develop increasing pain which is uncontrolled with medications.  °You have change in bowel or bladder function, or other concerns. ° ° ° °

## 2018-01-11 NOTE — ED Provider Notes (Signed)
MRI results reviewed, discussed with Dr. Christella Noa with neurosurgery Plan will be to place patient on pain medications and burst of steroids.  Patient is awake and alert and he is ambulating in the department He is able to move both arms, but the right arm has more pain which limits movement He has no emergent requirement for surgery at this time.  Plan will be to follow-up with neurosurgery next week. We discussed strict return precautions.   Narcotic database reviewed and considered in decision making    Ripley Fraise, MD 01/11/18 [phone]

## 2018-01-11 NOTE — ED Notes (Signed)
Dc reviewed with patient, encouraged to pickup prescription and adhere as prescribed, encouraged to call [facility] and make an appointment as ordered.

## 2018-01-11 NOTE — ED Provider Notes (Signed)
I assumed care of patient after he arrived for MRI of the C-spine.  In brief, he was transferred from [facility] for evaluation of neck pain, to evaluate for possible central cord syndrome or other cervical myelopathy/radiculopathy. History is very challenging as patient is sedated from pain medications.  However it appears that he has been having neck pain, weakness in his arms, pain in his arms recently.  Minimal touch of either arm results in significant pain.  No signs of trauma to his extremities.  Labs and imaging results have been reviewed.  EKG reviewed. Currently he is hemodynamically appropriate Will order MRI C-spine.   Ripley Fraise, MD 01/11/18 0127

## 2018-01-15 DIAGNOSIS — M5000 Cervical disc disorder with myelopathy, unspecified cervical region: Secondary | ICD-10-CM | POA: Diagnosis not present

## 2018-01-15 DIAGNOSIS — I10 Essential (primary) hypertension: Secondary | ICD-10-CM | POA: Diagnosis not present

## 2018-01-15 DIAGNOSIS — Z6841 Body Mass Index (BMI) 40.0 and over, adult: Secondary | ICD-10-CM | POA: Diagnosis not present

## 2018-01-16 ENCOUNTER — Other Ambulatory Visit: Payer: Self-pay | Admitting: Neurosurgery

## 2018-01-17 NOTE — Pre-Procedure Instructions (Signed)
Eric Cruz  01/17/2018      [id number] [address], [place] [address] Phone: [phone] Fax: [phone]    Your procedure is scheduled on Fri. Oct. 25, 2019 from 4:07pm-6:59pm  Report to [facility] [place] Entrance "A" at 2:00pm  Call this number if you have problems the morning of surgery:  [phone]   Remember:  Do not eat or drink after midnight on Oct. 24th    Take these medicines the morning of surgery with A SIP OF WATER: FLUoxetine (PROZAC), Pantoprazole (PROTONIX), and Venlafaxine [facility])  If needed HYDROcodone-acetaminophen (NORCO/VICODIN)  As of today, stop taking all Other Aspirin Products, Vitamins, Fish oils, and Herbal medications. Also stop all NSAIDS i.e. Advil, Ibuprofen, Motrin, Aleve, Anaprox, Naproxen, BC, Goody Powders, and all Supplements.   Do not wear jewelry.  Do not wear lotions, powders, colognes, or deodorant.  Do not shave 48 hours prior to surgery.  Men may shave face.  Do not bring valuables to the hospital.  [facility] is not responsible for any belongings or valuables.  Contacts, dentures or bridgework may not be worn into surgery.  Leave your suitcase in the car.  After surgery it may be brought to your room.  For patients admitted to the hospital, discharge time will be determined by your treatment team.  Patients discharged the day of surgery will not be allowed to drive home.   Special instructions:  [facility]- Preparing For Surgery  Before surgery, you can play an important role. Because skin is not sterile, your skin needs to be as free of germs as possible. You can reduce the number of germs on your skin by washing with CHG (chlorahexidine gluconate) Soap before surgery.  CHG is an antiseptic cleaner which kills germs and bonds with the skin to continue killing germs even after washing.    Oral Hygiene is also important to reduce your risk of  infection.  Remember - BRUSH YOUR TEETH THE MORNING OF SURGERY WITH YOUR REGULAR TOOTHPASTE  Please do not use if you have an allergy to CHG or antibacterial soaps. If your skin becomes reddened/irritated stop using the CHG.  Do not shave (including legs and underarms) for at least 48 hours prior to first CHG shower. It is OK to shave your face.  Please follow these instructions carefully.   1. Shower the NIGHT BEFORE SURGERY and the MORNING OF SURGERY with CHG.   2. If you chose to wash your hair, wash your hair first as usual with your normal shampoo.  3. After you shampoo, rinse your hair and body thoroughly to remove the shampoo.  4. Use CHG as you would any other liquid soap. You can apply CHG directly to the skin and wash gently with a scrungie or a clean washcloth.   5. Apply the CHG Soap to your body ONLY FROM THE NECK DOWN.  Do not use on open wounds or open sores. Avoid contact with your eyes, ears, mouth and genitals (private parts). Wash Face and genitals (private parts)  with your normal soap.  6. Wash thoroughly, paying special attention to the area where your surgery will be performed.  7. Thoroughly rinse your body with warm water from the neck down.  8. DO NOT shower/wash with your normal soap after using and rinsing off the CHG Soap.  9. Pat yourself dry with a CLEAN TOWEL.  10. Wear CLEAN PAJAMAS to bed the  night before surgery, wear comfortable clothes the morning of surgery  11. Place CLEAN SHEETS on your bed the night of your first shower and DO NOT SLEEP WITH PETS.  Day of Surgery:  Do not apply any deodorants/lotions.  Please wear clean clothes to the hospital/surgery center.   Remember to brush your teeth WITH YOUR REGULAR TOOTHPASTE.  Please read over the following fact sheets that you were given. Pain Booklet, Coughing and Deep Breathing, MRSA Information and Surgical Site Infection Prevention

## 2018-01-18 ENCOUNTER — Other Ambulatory Visit: Payer: Self-pay

## 2018-01-18 ENCOUNTER — Ambulatory Visit (HOSPITAL_COMMUNITY)
Admission: EM | Admit: 2018-01-18 | Discharge: 2018-01-18 | Disposition: A | Discharge status: Home / Self Care | Payer: Federal, State, Local not specified - PPO | Attending: Family Medicine | Admitting: Family Medicine

## 2018-01-18 ENCOUNTER — Encounter (HOSPITAL_COMMUNITY): Payer: Self-pay

## 2018-01-18 ENCOUNTER — Encounter (HOSPITAL_COMMUNITY)
Admission: RE | Admit: 2018-01-18 | Discharge: 2018-01-18 | Disposition: A | Discharge status: Home / Self Care | Payer: Federal, State, Local not specified - PPO | Source: Ambulatory Visit | Attending: Neurosurgery | Admitting: Neurosurgery

## 2018-01-18 ENCOUNTER — Encounter (HOSPITAL_COMMUNITY): Payer: Self-pay | Admitting: Emergency Medicine

## 2018-01-18 ENCOUNTER — Encounter (HOSPITAL_COMMUNITY): Payer: Self-pay | Admitting: Physician Assistant

## 2018-01-18 DIAGNOSIS — Z888 Allergy status to other drugs, medicaments and biological substances status: Secondary | ICD-10-CM | POA: Insufficient documentation

## 2018-01-18 DIAGNOSIS — M109 Gout, unspecified: Secondary | ICD-10-CM | POA: Insufficient documentation

## 2018-01-18 DIAGNOSIS — Z79899 Other long term (current) drug therapy: Secondary | ICD-10-CM | POA: Insufficient documentation

## 2018-01-18 DIAGNOSIS — I10 Essential (primary) hypertension: Secondary | ICD-10-CM | POA: Insufficient documentation

## 2018-01-18 DIAGNOSIS — L0211 Cutaneous abscess of neck: Secondary | ICD-10-CM | POA: Diagnosis not present

## 2018-01-18 DIAGNOSIS — N400 Enlarged prostate without lower urinary tract symptoms: Secondary | ICD-10-CM | POA: Diagnosis not present

## 2018-01-18 DIAGNOSIS — Z87891 Personal history of nicotine dependence: Secondary | ICD-10-CM | POA: Diagnosis not present

## 2018-01-18 DIAGNOSIS — K219 Gastro-esophageal reflux disease without esophagitis: Secondary | ICD-10-CM | POA: Insufficient documentation

## 2018-01-18 DIAGNOSIS — E7800 Pure hypercholesterolemia, unspecified: Secondary | ICD-10-CM | POA: Diagnosis not present

## 2018-01-18 DIAGNOSIS — G4730 Sleep apnea, unspecified: Secondary | ICD-10-CM | POA: Diagnosis not present

## 2018-01-18 HISTORY — DX: Malignant (primary) neoplasm, unspecified: C80.1

## 2018-01-18 LAB — TYPE AND SCREEN
ABO/RH(D): B POS
Antibody Screen: NEGATIVE

## 2018-01-18 LAB — CBC
HCT: 38.9 % — ABNORMAL LOW (ref 39.0–52.0)
Hemoglobin: 12.2 g/dL — ABNORMAL LOW (ref 13.0–17.0)
MCH: 28.2 pg (ref 26.0–34.0)
MCHC: 31.4 g/dL (ref 30.0–36.0)
MCV: 90 fL (ref 80.0–100.0)
Platelets: 421 10*3/uL — ABNORMAL HIGH (ref 150–400)
RBC: 4.32 MIL/uL (ref 4.22–5.81)
RDW: 14.8 % (ref 11.5–15.5)
WBC: 16.7 10*3/uL — ABNORMAL HIGH (ref 4.0–10.5)
nRBC: 0 % (ref 0.0–0.2)

## 2018-01-18 LAB — BASIC METABOLIC PANEL
Anion gap: 10 (ref 5–15)
BUN: 19 mg/dL (ref 8–23)
CO2: 25 mmol/L (ref 22–32)
Calcium: 9.5 mg/dL (ref 8.9–10.3)
Chloride: 98 mmol/L (ref 98–111)
Creatinine, Ser: 1.06 mg/dL (ref 0.61–1.24)
GFR calc Af Amer: >60 mL/min (ref >60)
GFR calc non Af Amer: >60 mL/min (ref >60)
Glucose, Bld: 109 mg/dL — ABNORMAL HIGH (ref 70–99)
Potassium: 4 mmol/L (ref 3.5–5.1)
Sodium: 133 mmol/L — ABNORMAL LOW (ref 135–145)

## 2018-01-18 LAB — ABO/RH: ABO/RH(D): B POS

## 2018-01-18 LAB — SURGICAL PCR SCREEN
MRSA, PCR: NEGATIVE
Staphylococcus aureus: NEGATIVE

## 2018-01-18 MED ORDER — DOXYCYCLINE HYCLATE 100 MG PO TABS: 100.0000 mg | ORAL_TABLET | Freq: Two times a day (BID) | ORAL | 0 refills | Status: DC

## 2018-01-18 NOTE — Discharge Instructions (Addendum)
Name massage the abscess site several times a day after applying soapy warm water to the area.  Start the antibiotics tonight.  Ask your doctor to recheck this when you are planning the surgical care for the disc.

## 2018-01-18 NOTE — Progress Notes (Addendum)
PCP - Seward Carol MD  Chest x-ray - 01/10/18 EKG - 01/10/18 Stress Test - 2014 ECHO - 2019  CPAP - doesn't use it  Blood Thinner Instructions: N/A Aspirin Instructions: N/A  Anesthesia review: Jeneen Rinks, not sure if you wanted to follow up on him after seeing him.  Patient denies shortness of breath, fever, cough and chest pain at PAT appointment   Patient verbalized understanding of instructions that were given to them at the PAT appointment. Patient was also instructed that they will need to review over the PAT instructions again at home before surgery.

## 2018-01-18 NOTE — Progress Notes (Addendum)
Anesthesia Chart Review:  Case:  [id number] Date/Time:  01/25/18 1552   Procedure:  Cervical 5-6 Cervical 6-7 Anterior cervical decompression/discectomy/fusion (N/A ) - Cervical 5-6 Cervical 6-7 Anterior cervical decompression/discectomy/fusion   Anesthesia type:  General   Pre-op diagnosis:  Herniated nucleus pulposus with myelopathy, Cervical region   Location:  [facility] OR ROOM 18 / [place] OR   Surgeon:  Ashok Pall, MD      DISCUSSION: 67 yo male former smoker. Pertinent hx includes HTN, GERD, Gout, OSA not currently using CPAP, Depression.  I saw Eric Cruz at his PAT appt today for evaluation of new swelling on left neck. He says about 3 days ago he noticed a small painful area on the left side of his neck that was initially about the size of a pin head.  Over the last 3 days it has increased in size and become more painful. On exam today there is an area of induration just below the left mandible that is about the size of an olive.  It is exquisitely TTP.  There is no drainage.  He denies any fever or signs of systemic illness.  Symptoms and exam are concerning for superficial skin infection.  As this infection is close proximity to surgical site for his upcoming ACDF he will need to have this treated expeditiously.  I spoke with his primary care provider Dr. Delfina Redwood at [place] to see if he could be worked in today but unfortunately he had no availability.  Patient plans to go to [facility] urgent care to be evaluated.  I spoke with Dr. Lacy Duverney scheduler Janett Billow to make her aware of the situation.  Unfortunately Dr. Christella Noa is out of the office until Thursday.  The patient will call their office on Monday to update on his condition.  He understands that if this is not improving his surgery may need to be postponed.  Ultimately the decision will be made based on his response to treatment and input from Dr. Christella Noa.   VS: BP 104/75   Pulse 100   Temp 36.8 C   Resp 18   Ht 5\' 8"  (1.727 m)   Wt (!) 155.1 kg    SpO2 97%   BMI 51.99 kg/m   PROVIDERS: Seward Carol, MD is PCP   LABS: Mild leukocytosis. Will be evaluated at urgent care for possible superficial skin infection. (all labs ordered are listed, but only abnormal results are displayed)  Labs Reviewed  CBC - Abnormal; Notable for the following components:      Result Value   WBC 16.7 (*)    Hemoglobin 12.2 (*)    HCT 38.9 (*)    Platelets 421 (*)    All other components within normal limits  BASIC METABOLIC PANEL - Abnormal; Notable for the following components:   Sodium 133 (*)    Glucose, Bld 109 (*)    All other components within normal limits  SURGICAL PCR SCREEN  TYPE AND SCREEN  ABO/RH     IMAGES: MRI Cspine 01/11/2018: IMPRESSION: 1. Moderate C6-7 spinal canal stenosis secondary to intermediate central disc extrusion that indents the ventral spinal cord. No cord signal change. 2. Moderate left C5-6 neural foraminal stenosis. Mild spinal canal stenosis also at this level.  CHEST - 2 VIEW 01/10/18  COMPARISON:  11/11/2012  FINDINGS: Atherosclerotic calcification of the aortic arch. Heart size within normal limits. The lungs appear clear.  Poor visualization of the thoracic vertebra due to underpenetration and body habitus. No obvious fracture or subluxation.  IMPRESSION: 1.  Aortic Atherosclerosis (ICD10-I70.0). 2.  Otherwise, no significant abnormalities are observed.   EKG: 01/10/2018: Sinus rhythm. Rate 84  CV: TTE 06/07/2017: Study Conclusions  - Left ventricle: The cavity size was normal. Systolic function was   normal. The estimated ejection fraction was in the range of 60%   to 65%. Wall motion was normal; there were no regional wall   motion abnormalities. Left ventricular diastolic function   parameters were normal. - Aortic valve: Trileaflet; normal thickness leaflets. There was no   regurgitation. - Mitral valve: There was trivial regurgitation. - Left atrium: The atrium was  moderately dilated. - Right ventricle: The cavity size was normal. Wall thickness was   normal. Systolic function was normal. - Right atrium: The atrium was normal in size. - Tricuspid valve: There was trivial regurgitation. - Pulmonic valve: There was no regurgitation. - Pulmonary arteries: Systolic pressure was mildly increased. - Inferior vena cava: The vessel was normal in size. - Pericardium, extracardiac: There was no pericardial effusion.  Past Medical History:  Diagnosis Date  . Arthritis   . BPH (benign prostatic hyperplasia)   . Cancer [facility])    Prostate: states he had a positive biopsy but had another one a year later and it was gone.   . Depression   . Dizziness   . GERD (gastroesophageal reflux disease)   . Gout   . High cholesterol   . Hypertension   . Sleep apnea    has cpap - not currently wearing  . Varicose veins     Past Surgical History:  Procedure Laterality Date  . BLADDER SURGERY    . CHOLECYSTECTOMY  11/22/2012  . CHOLECYSTECTOMY  11/22/2012   Procedure: LAPAROSCOPIC CHOLECYSTECTOMY;  Surgeon: Gwenyth Ober, MD;  Location: [facility] OR;  Service: General;;  . COLONOSCOPY      MEDICATIONS: . amLODipine-benazepril (LOTREL) 5-10 MG per capsule  . finasteride (PROSCAR) 5 MG tablet  . furosemide (LASIX) 40 MG tablet  . HYDROcodone-acetaminophen (NORCO/VICODIN) 5-325 MG tablet  . pantoprazole (PROTONIX) 20 MG tablet  . tamsulosin (FLOMAX) 0.4 MG CAPS capsule   No current facility-administered medications for this encounter.      Wynonia Musty [facility] Short Stay Center/Anesthesiology Phone [phone] 01/18/2018 4:55 PM

## 2018-01-18 NOTE — ED Provider Notes (Signed)
[place]    CSN: [id number] Arrival date & time: 01/18/18  1544     History   Chief Complaint Chief Complaint  Patient presents with  . Abscess    left neck    HPI Eric Cruz is a 67 y.o. male.   Is a 67 year old man who is planning cervical spine surgery in a week.  He has been having a small area of swelling in his left neck over the last 3 weeks and has been trying to get somebody to attend to it.  This area is becoming more more tender.  The anesthesiologist in the preop visit sent him here today.     Past Medical History:  Diagnosis Date  . Arthritis   . BPH (benign prostatic hyperplasia)   . Cancer [facility])    Prostate: states he had a positive biopsy but had another one a year later and it was gone.   . Depression   . Dizziness   . GERD (gastroesophageal reflux disease)   . Gout   . High cholesterol   . Hypertension   . Sleep apnea    has cpap - not currently wearing  . Varicose veins     Patient Active Problem List   Diagnosis Date Noted  . Postop check 12/10/2012  . Symptomatic cholelithiasis 11/15/2012    Past Surgical History:  Procedure Laterality Date  . BLADDER SURGERY    . CHOLECYSTECTOMY  11/22/2012  . CHOLECYSTECTOMY  11/22/2012   Procedure: LAPAROSCOPIC CHOLECYSTECTOMY;  Surgeon: Gwenyth Ober, MD;  Location: [place];  Service: General;;  . COLONOSCOPY         Home Medications    Prior to Admission medications   Medication Sig Start Date End Date Taking? Authorizing Provider  amLODipine-benazepril (LOTREL) 5-10 MG per capsule Take 1 capsule by mouth daily.   Yes [provider]  finasteride (PROSCAR) 5 MG tablet Take 5 mg by mouth daily.   Yes [provider]  furosemide (LASIX) 40 MG tablet Take 40 mg by mouth daily.   Yes [provider]  HYDROcodone-acetaminophen (NORCO/VICODIN) 5-325 MG tablet Take 1 tablet by mouth every 6 (six) hours as needed for severe pain. 01/11/18  Yes Ripley Fraise, MD  tamsulosin (FLOMAX) 0.4 MG CAPS capsule Take 0.4 mg by mouth daily after supper.   Yes [provider]  doxycycline (VIBRA-TABS) 100 MG tablet Take 1 tablet (100 mg total) by mouth 2 (two) times daily. 01/18/18   Robyn Haber, MD  pantoprazole (PROTONIX) 20 MG tablet Take 2 tablets (40 mg total) by mouth daily. 11/11/12   Molpus, Jenny Reichmann, MD    Family History History reviewed. No pertinent family history.  Social History Social History   Tobacco Use  . Smoking status: Former Smoker    Last attempt to quit: 04/22/1998    Years since quitting: 19.7  . Smokeless tobacco: Never Used  Substance Use Topics  . Alcohol use: No  . Drug use: No     Allergies   Statins and Zocor [simvastatin]   Review of Systems Review of Systems   Physical Exam Triage Vital Signs ED Triage Vitals  Enc Vitals Group     BP 01/18/18 1616 128/75     Pulse Rate 01/18/18 1616 (!) 104     Resp 01/18/18 1616 20     Temp 01/18/18 1616 98.7 F (37.1 C)     Temp Source 01/18/18 1616 Oral     SpO2 01/18/18 1616 100 %  Weight --      Height --      Head Circumference --      Peak Flow --      Pain Score 01/18/18 1620 7     Pain Loc --      Pain Edu? --      Excl. in [place]? --    No data found.  Updated Vital Signs BP 128/75 (BP Location: Left Arm)   Pulse (!) 104   Temp 98.7 F (37.1 C) (Oral)   Resp 20   SpO2 100%    Physical Exam  Constitutional: He is oriented to person, place, and time. He appears well-developed and well-nourished.  Neck: Normal range of motion. Neck supple.  Obvious 1 cm abscess in the left neck  Pulmonary/Chest: Effort normal.  Musculoskeletal: Normal range of motion.  Neurological: He is alert and oriented to person, place, and time.  Skin: Skin is warm and dry.  Nursing note and vitals reviewed.    UC Treatments / Results  Labs (all labs ordered are listed, but only abnormal results are displayed) Labs Reviewed  AEROBIC CULTURE  (SUPERFICIAL SPECIMEN)    EKG None  Radiology No results found.  Procedures Incision and Drainage Date/Time: 01/18/2018 4:40 PM Performed by: Robyn Haber, MD Authorized by: Robyn Haber, MD   Consent:    Consent obtained:  Verbal   Consent given by:  Patient and spouse   Risks discussed:  Incomplete drainage and infection   Alternatives discussed:  No treatment Location:    Type:  Abscess   Location:  Neck   Neck location:  L anterior Pre-procedure details:    Skin preparation:  Betadine Anesthesia (see MAR for exact dosages):    Anesthesia method:  Local infiltration   Local anesthetic:  Lidocaine 2% WITH epi Procedure type:    Complexity:  Simple Procedure details:    Needle aspiration: no     Incision types:  Stab incision   Incision depth:  Subcutaneous   Scalpel blade:  11   Wound management:  Probed and deloculated   Drainage:  Purulent   Drainage amount:  Moderate   Wound treatment:  Wound left open   Packing materials:  None Post-procedure details:    Patient tolerance of procedure:  Tolerated well, no immediate complications   (including critical care time)  Medications Ordered in UC Medications - No data to display  Initial Impression / Assessment and Plan / UC Course  I have reviewed the triage vital signs and the nursing notes.  Pertinent labs & imaging results that were available during my care of the patient were reviewed by me and considered in my medical decision making (see chart for details).    Final Clinical Impressions(s) / UC Diagnoses   Final diagnoses:  Abscess of skin of neck     Discharge Instructions     Name massage the abscess site several times a day after applying soapy warm water to the area.  Start the antibiotics tonight.  Ask your doctor to recheck this when you are planning the surgical care for the disc.   ED Prescriptions    Medication Sig Dispense Auth. Provider   doxycycline (VIBRA-TABS) 100 MG  tablet Take 1 tablet (100 mg total) by mouth 2 (two) times daily. 20 tablet Robyn Haber, MD     Controlled Substance Prescriptions [place] Controlled Substance Registry consulted? Not Applicable   Robyn Haber, MD 01/18/18 [phone]

## 2018-01-18 NOTE — ED Triage Notes (Signed)
Pt here for an abscess to his left neck that he first noticed 3 days ago.  Pt was seen in Pre-Op today, scheduled for spinal surgery next Friday.  The abscess is red, swollen and very painful.  Pt has a WBC count of 16.7 and reports a fever of 101.7 a few days ago.  It has been explained to the pt that we will be happy to see him, and his treatment will be based on what the provider decides after they see him.  I am unable to let him know exactly what they will be able to do for him.

## 2018-01-20 LAB — AEROBIC CULTURE W GRAM STAIN (SUPERFICIAL SPECIMEN)

## 2018-01-21 ENCOUNTER — Telehealth: Payer: Self-pay | Admitting: Emergency Medicine

## 2018-01-21 NOTE — Telephone Encounter (Signed)
Called patient, demographics verified.  Results communicated and patient verbalized understanding.  Patient states he has developed another area and would like to return to see Dr. Carlean Jews. Patient advised Dr. Carlean Jews will be in the clinic at [facility] and back at 10am on 10/23.  Patient states he wants to wait unit the 23rd to see if the medicine is going to work.  States if he is no better by Wednesday he will return to [facility] at that time. Patient very appreciative of the information. nmw

## 2018-01-23 ENCOUNTER — Ambulatory Visit (HOSPITAL_COMMUNITY)
Admission: EM | Admit: 2018-01-23 | Discharge: 2018-01-23 | Disposition: A | Discharge status: Home / Self Care | Payer: Medicare Other | Attending: Family Medicine | Admitting: Family Medicine

## 2018-01-23 ENCOUNTER — Encounter (HOSPITAL_COMMUNITY): Payer: Self-pay | Admitting: Emergency Medicine

## 2018-01-23 DIAGNOSIS — B9562 Methicillin resistant Staphylococcus aureus infection as the cause of diseases classified elsewhere: Secondary | ICD-10-CM | POA: Diagnosis not present

## 2018-01-23 DIAGNOSIS — L02439 Carbuncle of limb, unspecified: Secondary | ICD-10-CM

## 2018-01-23 DIAGNOSIS — L02436 Carbuncle of left lower limb: Secondary | ICD-10-CM | POA: Diagnosis not present

## 2018-01-23 DIAGNOSIS — Z22322 Carrier or suspected carrier of Methicillin resistant Staphylococcus aureus: Secondary | ICD-10-CM

## 2018-01-23 MED ORDER — HYDROCODONE-ACETAMINOPHEN 5-325 MG PO TABS: 1.0000 | ORAL_TABLET | Freq: Four times a day (QID) | ORAL | 0 refills | Status: DC | PRN

## 2018-01-23 MED ORDER — SULFAMETHOXAZOLE-TRIMETHOPRIM 800-160 MG PO TABS: 1.0000 | ORAL_TABLET | Freq: Two times a day (BID) | ORAL | 0 refills | Status: AC

## 2018-01-23 NOTE — ED Provider Notes (Signed)
[place]    CSN: [id number] Arrival date & time: 01/23/18  2725     History   Chief Complaint Chief Complaint  Patient presents with  . Follow-up    HPI Eric Cruz is a 67 y.o. male.   This very nice 67 year old man comes in for follow-up of an abscess in his left neck.  This was drained 5 days ago and the wound is healing nicely.  Nevertheless, his culture came back MRSA, resistant to tetracycline.  He is noted to new carbuncles, one on his left knee which is draining, and the other one on his left posterior thigh which is tender but dry.  Patient is anticipating cervical spine surgery with Dr. Christella Noa.  He has a appointment with Dr. Christella Noa tomorrow to discuss planning.     Past Medical History:  Diagnosis Date  . Arthritis   . BPH (benign prostatic hyperplasia)   . Cancer [facility])    Prostate: states he had a positive biopsy but had another one a year later and it was gone.   . Depression   . Dizziness   . GERD (gastroesophageal reflux disease)   . Gout   . High cholesterol   . Hypertension   . Sleep apnea    has cpap - not currently wearing  . Varicose veins     Patient Active Problem List   Diagnosis Date Noted  . Postop check 12/10/2012  . Symptomatic cholelithiasis 11/15/2012    Past Surgical History:  Procedure Laterality Date  . BLADDER SURGERY    . CHOLECYSTECTOMY  11/22/2012  . CHOLECYSTECTOMY  11/22/2012   Procedure: LAPAROSCOPIC CHOLECYSTECTOMY;  Surgeon: Gwenyth Ober, MD;  Location: [facility];  Service: General;;  . COLONOSCOPY         Home Medications    Prior to Admission medications   Medication Sig Start Date End Date Taking? Authorizing Provider  amLODipine-benazepril (LOTREL) 5-10 MG per capsule Take 1 capsule by mouth daily.   Yes [provider]  finasteride (PROSCAR) 5 MG tablet Take 5 mg by mouth daily.   Yes [provider]  furosemide (LASIX) 40 MG tablet Take 40 mg by mouth daily.   Yes  [provider]  pantoprazole (PROTONIX) 20 MG tablet Take 2 tablets (40 mg total) by mouth daily. 11/11/12  Yes Molpus, John, MD  tamsulosin (FLOMAX) 0.4 MG CAPS capsule Take 0.4 mg by mouth daily after supper.   Yes [provider]  HYDROcodone-acetaminophen (NORCO/VICODIN) 5-325 MG tablet Take 1 tablet by mouth every 6 (six) hours as needed for severe pain. 01/23/18   Robyn Haber, MD  sulfamethoxazole-trimethoprim (BACTRIM DS,SEPTRA DS) 800-160 MG tablet Take 1 tablet by mouth 2 (two) times daily for 14 days. 01/23/18 02/06/18  Robyn Haber, MD    Family History History reviewed. No pertinent family history.  Social History Social History   Tobacco Use  . Smoking status: Former Smoker    Last attempt to quit: 04/22/1998    Years since quitting: 19.7  . Smokeless tobacco: Never Used  Substance Use Topics  . Alcohol use: No  . Drug use: No     Allergies   Statins and Zocor [simvastatin]   Review of Systems Review of Systems  Constitutional: Negative.   Skin: Positive for rash and wound.     Physical Exam Triage Vital Signs ED Triage Vitals  Enc Vitals Group     BP --      Pulse Rate 01/23/18 1037 87  Resp --      Temp 01/23/18 1037 98.1 F (36.7 C)     Temp Source 01/23/18 1037 Oral     SpO2 01/23/18 1037 95 %     Weight --      Height --      Head Circumference --      Peak Flow --      Pain Score 01/23/18 1046 0     Pain Loc --      Pain Edu? --      Excl. in [place]? --    No data found.  Updated Vital Signs Pulse 87   Temp 98.1 F (36.7 C) (Oral)   SpO2 95%    Physical Exam  Constitutional: He is oriented to person, place, and time. He appears well-developed and well-nourished.  HENT:  Right Ear: External ear normal.  Left Ear: External ear normal.  Mouth/Throat: Oropharynx is clear and moist.  Eyes: Pupils are equal, round, and reactive to light. Conjunctivae are normal.  Neck: Normal range of motion. Neck supple.    Pulmonary/Chest: Effort normal.  Neurological: He is alert and oriented to person, place, and time.  Skin: Skin is warm.  Draining 1/2 cm carbuncle on left knee, superior patella  1-1/2 cm swollen subcutaneous lesion posterior right mid thigh  Dry incision with some induration, 6 mm, left neck.  Nursing note and vitals reviewed.    UC Treatments / Results  Labs (all labs ordered are listed, but only abnormal results are displayed) Labs Reviewed - No data to display  EKG None  Radiology No results found.  Procedures Procedures (including critical care time)  Medications Ordered in UC Medications - No data to display  Initial Impression / Assessment and Plan / UC Course  I have reviewed the triage vital signs and the nursing notes.  Pertinent labs & imaging results that were available during my care of the patient were reviewed by me and considered in my medical decision making (see chart for details).    Final Clinical Impressions(s) / UC Diagnoses   Final diagnoses:  MRSA (methicillin resistant staph aureus) culture positive  Carbuncle of knee   Discharge Instructions   None    ED Prescriptions    Medication Sig Dispense Auth. Provider   HYDROcodone-acetaminophen (NORCO/VICODIN) 5-325 MG tablet Take 1 tablet by mouth every 6 (six) hours as needed for severe pain. 20 tablet Robyn Haber, MD   sulfamethoxazole-trimethoprim (BACTRIM DS,SEPTRA DS) 800-160 MG tablet Take 1 tablet by mouth 2 (two) times daily for 14 days. 14 tablet Robyn Haber, MD     Controlled Substance Prescriptions [place] Controlled Substance Registry consulted? Not Applicable   Robyn Haber, MD 01/23/18 1210

## 2018-01-23 NOTE — ED Triage Notes (Signed)
Pt here for follow up of treatment for abscess on his left neck.  Pt states it drained significantly on Monday morning. On Saturday, he reports developing new abscesses in his right axilla and behind his right knee.  The axilla abscess drained on its own and the one behind his knee has been shrinking.  Pt tested positive for MRSA from his wound culture on Friday, but has continued to take his Doxycyline as prescribed.    Pt needs a repeat CBC to check his WBC for improvement.

## 2018-01-25 ENCOUNTER — Ambulatory Visit (HOSPITAL_COMMUNITY)
Admission: RE | Admit: 2018-01-25 | Payer: Federal, State, Local not specified - PPO | Source: Ambulatory Visit | Admitting: Neurosurgery

## 2018-01-25 ENCOUNTER — Encounter (HOSPITAL_COMMUNITY): Admission: RE | Payer: Self-pay | Source: Ambulatory Visit

## 2018-01-25 SURGERY — ANTERIOR CERVICAL DECOMPRESSION/DISCECTOMY FUSION 2 LEVELS
Anesthesia: General

## 2018-01-29 ENCOUNTER — Telehealth (HOSPITAL_COMMUNITY): Payer: Self-pay

## 2018-02-12 ENCOUNTER — Other Ambulatory Visit: Payer: Self-pay | Admitting: Neurosurgery

## 2018-02-18 NOTE — Pre-Procedure Instructions (Signed)
Eric Cruz  02/18/2018      [facility] [address], [place] [address] Phone: [phone] Fax: [phone]    Your procedure is scheduled on November 21st.  Report to [facility] Admitting at 1200 P.M.  Call this number if you have problems the morning of surgery:  [phone]   Remember:  Do not eat or drink after midnight.    Take these medicines the morning of surgery with A SIP OF WATER   amLODipine-benazepril (LOTREL)   finasteride (PROSCAR)   7 days prior to surgery STOP taking any Aspirin(unless otherwise instructed by your surgeon), Aleve, Naproxen, Ibuprofen, Motrin, Advil, Goody's, BC's, all herbal medications, fish oil, and all vitamins     Do not wear jewelry.  Do not wear lotions, powders, or colognes, or deodorant.  Men may shave face and neck.  Do not bring valuables to the hospital.  [facility] is not responsible for any belongings or valuables.  Contacts, dentures or bridgework may not be worn into surgery.  Leave your suitcase in the car.  After surgery it may be brought to your room.  For patients admitted to the hospital, discharge time will be determined by your treatment team.  Patients discharged the day of surgery will not be allowed to drive home.    [facility]- Preparing For Surgery  Before surgery, you can play an important role. Because skin is not sterile, your skin needs to be as free of germs as possible. You can reduce the number of germs on your skin by washing with CHG (chlorahexidine gluconate) Soap before surgery.  CHG is an antiseptic cleaner which kills germs and bonds with the skin to continue killing germs even after washing.    Oral Hygiene is also important to reduce your risk of infection.  Remember - BRUSH YOUR TEETH THE MORNING OF SURGERY WITH YOUR REGULAR TOOTHPASTE  Please do not use if you have an allergy to CHG or  antibacterial soaps. If your skin becomes reddened/irritated stop using the CHG.  Do not shave (including legs and underarms) for at least 48 hours prior to first CHG shower. It is OK to shave your face.  Please follow these instructions carefully.   1. Shower the NIGHT BEFORE SURGERY and the MORNING OF SURGERY with CHG.   2. If you chose to wash your hair, wash your hair first as usual with your normal shampoo.  3. After you shampoo, rinse your hair and body thoroughly to remove the shampoo.  4. Use CHG as you would any other liquid soap. You can apply CHG directly to the skin and wash gently with a scrungie or a clean washcloth.   5. Apply the CHG Soap to your body ONLY FROM THE NECK DOWN.  Do not use on open wounds or open sores. Avoid contact with your eyes, ears, mouth and genitals (private parts). Wash Face and genitals (private parts)  with your normal soap.  6. Wash thoroughly, paying special attention to the area where your surgery will be performed.  7. Thoroughly rinse your body with warm water from the neck down.  8. DO NOT shower/wash with your normal soap after using and rinsing off the CHG Soap.  9. Pat yourself dry with a CLEAN TOWEL.  10. Wear CLEAN PAJAMAS to bed the night before surgery, wear comfortable clothes the morning of surgery  11. Place CLEAN SHEETS on your  bed the night of your first shower and DO NOT SLEEP WITH PETS.    Day of Surgery:  Do not apply any deodorants/lotions.  Please wear clean clothes to the hospital/surgery center.   Remember to brush your teeth WITH YOUR REGULAR TOOTHPASTE.    Please read over the following fact sheets that you were given.

## 2018-02-19 ENCOUNTER — Encounter (HOSPITAL_COMMUNITY): Payer: Self-pay

## 2018-02-19 ENCOUNTER — Encounter (HOSPITAL_COMMUNITY)
Admission: RE | Admit: 2018-02-19 | Discharge: 2018-02-19 | Disposition: A | Discharge status: Home / Self Care | Payer: Federal, State, Local not specified - PPO | Source: Ambulatory Visit | Attending: Neurosurgery | Admitting: Neurosurgery

## 2018-02-19 ENCOUNTER — Other Ambulatory Visit: Payer: Self-pay

## 2018-02-19 DIAGNOSIS — Z01812 Encounter for preprocedural laboratory examination: Secondary | ICD-10-CM | POA: Insufficient documentation

## 2018-02-19 LAB — BASIC METABOLIC PANEL
Anion gap: 6 (ref 5–15)
BUN: 13 mg/dL (ref 8–23)
CO2: 26 mmol/L (ref 22–32)
Calcium: 9.3 mg/dL (ref 8.9–10.3)
Chloride: 105 mmol/L (ref 98–111)
Creatinine, Ser: 1.02 mg/dL (ref 0.61–1.24)
GFR calc Af Amer: >60 mL/min (ref >60)
GFR calc non Af Amer: >60 mL/min (ref >60)
Glucose, Bld: 104 mg/dL — ABNORMAL HIGH (ref 70–99)
Potassium: 3.9 mmol/L (ref 3.5–5.1)
Sodium: 137 mmol/L (ref 135–145)

## 2018-02-19 LAB — TYPE AND SCREEN
ABO/RH(D): B POS
Antibody Screen: NEGATIVE

## 2018-02-19 LAB — CBC
HCT: 34.2 % — ABNORMAL LOW (ref 39.0–52.0)
Hemoglobin: 10.3 g/dL — ABNORMAL LOW (ref 13.0–17.0)
MCH: 27.3 pg (ref 26.0–34.0)
MCHC: 30.1 g/dL (ref 30.0–36.0)
MCV: 90.7 fL (ref 80.0–100.0)
Platelets: 379 10*3/uL (ref 150–400)
RBC: 3.77 MIL/uL — ABNORMAL LOW (ref 4.22–5.81)
RDW: 15.1 % (ref 11.5–15.5)
WBC: 12.5 10*3/uL — ABNORMAL HIGH (ref 4.0–10.5)
nRBC: 0 % (ref 0.0–0.2)

## 2018-02-19 LAB — SURGICAL PCR SCREEN
MRSA, PCR: NEGATIVE
Staphylococcus aureus: NEGATIVE

## 2018-02-19 NOTE — Pre-Procedure Instructions (Signed)
Eric Cruz  02/19/2018      [facility] - [place], [place] [address] Phone: [phone] Fax: [phone]    Your procedure is scheduled on November 21st.  Report to [facility] Admitting at 1200 P.M.  Call this number if you have problems the morning of surgery:  [phone]   Remember:  Do not eat or drink after midnight.    Take these medicines the morning of surgery with A SIP OF WATER   finasteride (PROSCAR)   7 days prior to surgery STOP taking any Aspirin(unless otherwise instructed by your surgeon), Aleve, Naproxen, Ibuprofen, Motrin, Advil, Goody's, BC's, all herbal medications, fish oil, and all vitamins     Do not wear jewelry.  Do not wear lotions, powders, or colognes, or deodorant.  Men may shave face and neck.  Do not bring valuables to the hospital.  [facility] is not responsible for any belongings or valuables.  Contacts, dentures or bridgework may not be worn into surgery.  Leave your suitcase in the car.  After surgery it may be brought to your room.  For patients admitted to the hospital, discharge time will be determined by your treatment team.  Patients discharged the day of surgery will not be allowed to drive home.    [facility] Preparing For Surgery  Before surgery, you can play an important role. Because skin is not sterile, your skin needs to be as free of germs as possible. You can reduce the number of germs on your skin by washing with CHG (chlorahexidine gluconate) Soap before surgery.  CHG is an antiseptic cleaner which kills germs and bonds with the skin to continue killing germs even after washing.    Oral Hygiene is also important to reduce your risk of infection.  Remember - BRUSH YOUR TEETH THE MORNING OF SURGERY WITH YOUR REGULAR TOOTHPASTE  Please do not use if you have an allergy to CHG or antibacterial soaps. If your skin becomes  reddened/irritated stop using the CHG.  Do not shave (including legs and underarms) for at least 48 hours prior to first CHG shower. It is OK to shave your face.  Please follow these instructions carefully.   1. Shower the NIGHT BEFORE SURGERY and the MORNING OF SURGERY with CHG.   2. If you chose to wash your hair, wash your hair first as usual with your normal shampoo.  3. After you shampoo, rinse your hair and body thoroughly to remove the shampoo.  4. Use CHG as you would any other liquid soap. You can apply CHG directly to the skin and wash gently with a scrungie or a clean washcloth.   5. Apply the CHG Soap to your body ONLY FROM THE NECK DOWN.  Do not use on open wounds or open sores. Avoid contact with your eyes, ears, mouth and genitals (private parts). Wash Face and genitals (private parts)  with your normal soap.  6. Wash thoroughly, paying special attention to the area where your surgery will be performed.  7. Thoroughly rinse your body with warm water from the neck down.  8. DO NOT shower/wash with your normal soap after using and rinsing off the CHG Soap.  9. Pat yourself dry with a CLEAN TOWEL.  10. Wear CLEAN PAJAMAS to bed the night before surgery, wear comfortable clothes the morning of surgery  11. Place CLEAN SHEETS on your bed the night of  your first shower and DO NOT SLEEP WITH PETS.    Day of Surgery:  Do not apply any deodorants/lotions.  Please wear clean clothes to the hospital/surgery center.   Remember to brush your teeth WITH YOUR REGULAR TOOTHPASTE.    Please read over the following fact sheets that you were given.

## 2018-02-19 NOTE — Progress Notes (Addendum)
PCP - Seward Carol Cardiologist - denies  Chest x-ray - 01/10/18 EKG - 01/10/18 Stress Test - 2014 ECHO - 06/07/17 Cardiac Cath - denies  Sleep Study - OSA positive; denies use of CPAP nightly  Aspirin Instructions: N/A  Anesthesia review: Yes, see note from Marty on 01/18/18.   Patient denies shortness of breath, fever, cough and chest pain at PAT appointment   Patient verbalized understanding of instructions that were given to them at the PAT appointment. Patient was also instructed that they will need to review over the PAT instructions again at home before surgery.

## 2018-02-20 NOTE — Anesthesia Preprocedure Evaluation (Addendum)
Anesthesia Evaluation  Patient identified by MRN, date of birth, ID band Patient awake    Reviewed: Allergy & Precautions, NPO status , Patient's Chart, lab work & pertinent test results  Airway Mallampati: II  TM Distance: >3 FB     Dental  (+) Dental Advisory Given   Pulmonary sleep apnea , former smoker,    breath sounds clear to auscultation       Cardiovascular hypertension, Pt. on medications  Rhythm:Regular Rate:Normal  CV: TTE 06/07/2017: Study Conclusions  - Left ventricle: The cavity size was normal. Systolic function was normal. The estimated ejection fraction was in the range of 60% to 65%. Wall motion was normal; there were no regional wall motion abnormalities. Left ventricular diastolic function parameters were normal. - Aortic valve: Trileaflet; normal thickness leaflets. There was no regurgitation. - Mitral valve: There was trivial regurgitation. - Left atrium: The atrium was moderately dilated. - Right ventricle: The cavity size was normal. Wall thickness was normal. Systolic function was normal. - Right atrium: The atrium was normal in size. - Tricuspid valve: There was trivial regurgitation. - Pulmonic valve: There was no regurgitation. - Pulmonary arteries: Systolic pressure was mildly increased. - Inferior vena cava: The vessel was normal in size. - Pericardium, extracardiac: There was no pericardial effusion.   Neuro/Psych Depression negative neurological ROS     GI/Hepatic Neg liver ROS, GERD  ,  Endo/Other  negative endocrine ROS  Renal/GU negative Renal ROS     Musculoskeletal  (+) Arthritis ,   Abdominal   Peds  Hematology  (+) anemia ,   Anesthesia Other Findings   Reproductive/Obstetrics                           Lab Results  Component Value Date   WBC 12.5 (H) 02/19/2018   HGB 10.3 (L) 02/19/2018   HCT 34.2 (L) 02/19/2018   MCV 90.7  02/19/2018   PLT 379 02/19/2018   Lab Results  Component Value Date   CREATININE 1.02 02/19/2018   BUN 13 02/19/2018   NA 137 02/19/2018   K 3.9 02/19/2018   CL 105 02/19/2018   CO2 26 02/19/2018    Anesthesia Physical Anesthesia Plan  ASA: III  Anesthesia Plan: General   Post-op Pain Management:    Induction: Intravenous  PONV Risk Score and Plan: 2 and Ondansetron, Dexamethasone and Treatment may vary due to age or medical condition  Airway Management Planned: Oral ETT and Video Laryngoscope Planned  Additional Equipment:   Intra-op Plan:   Post-operative Plan: Extubation in OR  Informed Consent: I have reviewed the patients History and Physical, chart, labs and discussed the procedure including the risks, benefits and alternatives for the proposed anesthesia with the patient or authorized representative who has indicated his/her understanding and acceptance.   Dental advisory given  Plan Discussed with: CRNA  Anesthesia Plan Comments: ( )       Anesthesia Quick Evaluation

## 2018-02-21 ENCOUNTER — Ambulatory Visit (HOSPITAL_COMMUNITY): Payer: Federal, State, Local not specified - PPO | Admitting: Anesthesiology

## 2018-02-21 ENCOUNTER — Ambulatory Visit (HOSPITAL_COMMUNITY): Payer: Federal, State, Local not specified - PPO

## 2018-02-21 ENCOUNTER — Ambulatory Visit (HOSPITAL_COMMUNITY): Payer: Federal, State, Local not specified - PPO | Admitting: Physician Assistant

## 2018-02-21 ENCOUNTER — Inpatient Hospital Stay (HOSPITAL_COMMUNITY)
Admission: AD | Disposition: A | Discharge status: Home / Self Care | Payer: Self-pay | Source: Home / Self Care | Attending: Neurosurgery

## 2018-02-21 ENCOUNTER — Encounter (HOSPITAL_COMMUNITY): Payer: Self-pay

## 2018-02-21 ENCOUNTER — Observation Stay (HOSPITAL_COMMUNITY)
Admission: AD | Admit: 2018-02-21 | Discharge: 2018-02-24 | DRG: 473 | Disposition: A | Discharge status: Home / Self Care | Payer: Federal, State, Local not specified - PPO | Attending: Neurosurgery | Admitting: Neurosurgery

## 2018-02-21 DIAGNOSIS — M5000 Cervical disc disorder with myelopathy, unspecified cervical region: Secondary | ICD-10-CM | POA: Diagnosis present

## 2018-02-21 DIAGNOSIS — Z981 Arthrodesis status: Secondary | ICD-10-CM | POA: Diagnosis not present

## 2018-02-21 DIAGNOSIS — Z87891 Personal history of nicotine dependence: Secondary | ICD-10-CM | POA: Insufficient documentation

## 2018-02-21 DIAGNOSIS — K219 Gastro-esophageal reflux disease without esophagitis: Secondary | ICD-10-CM | POA: Insufficient documentation

## 2018-02-21 DIAGNOSIS — M50023 Cervical disc disorder at C6-C7 level with myelopathy: Secondary | ICD-10-CM | POA: Diagnosis not present

## 2018-02-21 DIAGNOSIS — I10 Essential (primary) hypertension: Secondary | ICD-10-CM | POA: Diagnosis not present

## 2018-02-21 DIAGNOSIS — Z8546 Personal history of malignant neoplasm of prostate: Secondary | ICD-10-CM | POA: Insufficient documentation

## 2018-02-21 DIAGNOSIS — M4802 Spinal stenosis, cervical region: Secondary | ICD-10-CM | POA: Diagnosis not present

## 2018-02-21 DIAGNOSIS — M50022 Cervical disc disorder at C5-C6 level with myelopathy: Principal | ICD-10-CM | POA: Insufficient documentation

## 2018-02-21 DIAGNOSIS — Z419 Encounter for procedure for purposes other than remedying health state, unspecified: Secondary | ICD-10-CM

## 2018-02-21 DIAGNOSIS — Z79899 Other long term (current) drug therapy: Secondary | ICD-10-CM | POA: Insufficient documentation

## 2018-02-21 HISTORY — PX: ANTERIOR CERVICAL DECOMP/DISCECTOMY FUSION: SHX1161

## 2018-02-21 SURGERY — ANTERIOR CERVICAL DECOMPRESSION/DISCECTOMY FUSION 2 LEVELS
Anesthesia: General

## 2018-02-21 MED ORDER — DEXAMETHASONE SODIUM PHOSPHATE 4 MG/ML IJ SOLN: 4.0000 mg | Freq: Four times a day (QID) | INTRAMUSCULAR | Status: AC

## 2018-02-21 MED ORDER — ROCURONIUM BROMIDE 50 MG/5ML IV SOSY
PREFILLED_SYRINGE | INTRAVENOUS | Status: AC
  Filled 2018-02-21: qty 10

## 2018-02-21 MED ORDER — FENTANYL CITRATE (PF) 250 MCG/5ML IJ SOLN
INTRAMUSCULAR | Status: AC
  Filled 2018-02-21: qty 5

## 2018-02-21 MED ORDER — SUCCINYLCHOLINE CHLORIDE 200 MG/10ML IV SOSY
PREFILLED_SYRINGE | INTRAVENOUS | Status: DC | PRN
  Administered 2018-02-21: 160 mg via INTRAVENOUS

## 2018-02-21 MED ORDER — ROCURONIUM BROMIDE 10 MG/ML (PF) SYRINGE
PREFILLED_SYRINGE | INTRAVENOUS | Status: DC | PRN
  Administered 2018-02-21 (×3): 20 mg via INTRAVENOUS
  Administered 2018-02-21: 80 mg via INTRAVENOUS

## 2018-02-21 MED ORDER — CHLORHEXIDINE GLUCONATE CLOTH 2 % EX PADS: 6.0000 | MEDICATED_PAD | Freq: Once | CUTANEOUS | Status: DC

## 2018-02-21 MED ORDER — PROPOFOL 10 MG/ML IV BOLUS
INTRAVENOUS | Status: DC | PRN
  Administered 2018-02-21: 250 mg via INTRAVENOUS

## 2018-02-21 MED ORDER — DOCUSATE SODIUM 100 MG PO CAPS
100.0000 mg | ORAL_CAPSULE | Freq: Two times a day (BID) | ORAL | Status: DC
  Administered 2018-02-22 – 2018-02-24 (×5): 100 mg via ORAL
  Filled 2018-02-21 (×5): qty 1

## 2018-02-21 MED ORDER — MORPHINE SULFATE (PF) 2 MG/ML IV SOLN
2.0000 mg | INTRAVENOUS | Status: DC | PRN
  Administered 2018-02-22: 2 mg via INTRAVENOUS
  Filled 2018-02-21: qty 1

## 2018-02-21 MED ORDER — SODIUM CHLORIDE 0.9 % IV SOLN: 250.0000 mL | INTRAVENOUS | Status: DC

## 2018-02-21 MED ORDER — ACETAMINOPHEN 650 MG RE SUPP: 650.0000 mg | RECTAL | Status: DC | PRN

## 2018-02-21 MED ORDER — OXYCODONE HCL 5 MG PO TABS
10.0000 mg | ORAL_TABLET | ORAL | Status: DC | PRN
  Administered 2018-02-22 (×2): 10 mg via ORAL
  Filled 2018-02-21 (×2): qty 2

## 2018-02-21 MED ORDER — BISACODYL 5 MG PO TBEC
5.0000 mg | DELAYED_RELEASE_TABLET | Freq: Every day | ORAL | Status: DC | PRN
  Administered 2018-02-23: 5 mg via ORAL
  Filled 2018-02-21: qty 1

## 2018-02-21 MED ORDER — THROMBIN (RECOMBINANT) 5000 UNITS EX SOLR
CUTANEOUS | Status: AC
  Filled 2018-02-21: qty 5000

## 2018-02-21 MED ORDER — DEXAMETHASONE SODIUM PHOSPHATE 10 MG/ML IJ SOLN
INTRAMUSCULAR | Status: DC | PRN
  Administered 2018-02-21: 10 mg via INTRAVENOUS

## 2018-02-21 MED ORDER — OXYCODONE HCL 5 MG PO TABS
5.0000 mg | ORAL_TABLET | ORAL | Status: DC | PRN
  Administered 2018-02-23: 5 mg via ORAL
  Filled 2018-02-21: qty 1

## 2018-02-21 MED ORDER — FENTANYL CITRATE (PF) 100 MCG/2ML IJ SOLN
INTRAMUSCULAR | Status: AC
  Filled 2018-02-21: qty 2

## 2018-02-21 MED ORDER — LIDOCAINE 2% (20 MG/ML) 5 ML SYRINGE
INTRAMUSCULAR | Status: DC | PRN
  Administered 2018-02-21: 100 mg via INTRAVENOUS

## 2018-02-21 MED ORDER — GABAPENTIN 300 MG PO CAPS
300.0000 mg | ORAL_CAPSULE | Freq: Three times a day (TID) | ORAL | Status: DC
  Administered 2018-02-21 – 2018-02-24 (×9): 300 mg via ORAL
  Filled 2018-02-21 (×9): qty 1

## 2018-02-21 MED ORDER — DIAZEPAM 5 MG PO TABS
5.0000 mg | ORAL_TABLET | Freq: Four times a day (QID) | ORAL | Status: DC | PRN
  Administered 2018-02-23: 5 mg via ORAL
  Filled 2018-02-21: qty 1

## 2018-02-21 MED ORDER — SODIUM CHLORIDE 0.9% FLUSH: 3.0000 mL | INTRAVENOUS | Status: DC | PRN

## 2018-02-21 MED ORDER — POTASSIUM CHLORIDE IN NACL 20-0.9 MEQ/L-% IV SOLN
INTRAVENOUS | Status: DC
  Administered 2018-02-21 – 2018-02-22 (×2): via INTRAVENOUS
  Filled 2018-02-21 (×2): qty 1000

## 2018-02-21 MED ORDER — SUCCINYLCHOLINE CHLORIDE 200 MG/10ML IV SOSY
PREFILLED_SYRINGE | INTRAVENOUS | Status: AC
  Filled 2018-02-21: qty 10

## 2018-02-21 MED ORDER — BENAZEPRIL HCL 20 MG PO TABS
10.0000 mg | ORAL_TABLET | Freq: Every day | ORAL | Status: DC
  Administered 2018-02-21 – 2018-02-24 (×4): 10 mg via ORAL
  Filled 2018-02-21 (×4): qty 1

## 2018-02-21 MED ORDER — LACTATED RINGERS IV SOLN
INTRAVENOUS | Status: DC | PRN
  Administered 2018-02-21 (×2): via INTRAVENOUS

## 2018-02-21 MED ORDER — HEMOSTATIC AGENTS (NO CHARGE) OPTIME
TOPICAL | Status: DC | PRN
  Administered 2018-02-21: 1 via TOPICAL

## 2018-02-21 MED ORDER — LIDOCAINE 2% (20 MG/ML) 5 ML SYRINGE
INTRAMUSCULAR | Status: AC
  Filled 2018-02-21: qty 5

## 2018-02-21 MED ORDER — THROMBIN 5000 UNITS EX SOLR
CUTANEOUS | Status: AC
  Filled 2018-02-21: qty 10000

## 2018-02-21 MED ORDER — FUROSEMIDE 40 MG PO TABS
40.0000 mg | ORAL_TABLET | Freq: Every day | ORAL | Status: DC
  Administered 2018-02-22 – 2018-02-24 (×3): 40 mg via ORAL
  Filled 2018-02-21 (×3): qty 1

## 2018-02-21 MED ORDER — SUGAMMADEX SODIUM 500 MG/5ML IV SOLN
INTRAVENOUS | Status: AC
  Filled 2018-02-21: qty 5

## 2018-02-21 MED ORDER — PROPOFOL 10 MG/ML IV BOLUS
INTRAVENOUS | Status: AC
  Filled 2018-02-21: qty 20

## 2018-02-21 MED ORDER — SUGAMMADEX SODIUM 200 MG/2ML IV SOLN
INTRAVENOUS | Status: DC | PRN
  Administered 2018-02-21: 500 mg via INTRAVENOUS

## 2018-02-21 MED ORDER — SENNOSIDES-DOCUSATE SODIUM 8.6-50 MG PO TABS
1.0000 | ORAL_TABLET | Freq: Every evening | ORAL | Status: DC | PRN
  Administered 2018-02-22: 1 via ORAL
  Filled 2018-02-21: qty 1

## 2018-02-21 MED ORDER — ONDANSETRON HCL 4 MG/2ML IJ SOLN
INTRAMUSCULAR | Status: DC | PRN
  Administered 2018-02-21: 4 mg via INTRAVENOUS

## 2018-02-21 MED ORDER — FENTANYL CITRATE (PF) 250 MCG/5ML IJ SOLN
INTRAMUSCULAR | Status: DC | PRN
  Administered 2018-02-21 (×3): 50 ug via INTRAVENOUS
  Administered 2018-02-21: 100 ug via INTRAVENOUS
  Administered 2018-02-21: 50 ug via INTRAVENOUS

## 2018-02-21 MED ORDER — OXYCODONE HCL ER 10 MG PO T12A
10.0000 mg | EXTENDED_RELEASE_TABLET | Freq: Two times a day (BID) | ORAL | Status: DC
  Administered 2018-02-21 – 2018-02-24 (×6): 10 mg via ORAL
  Filled 2018-02-21 (×6): qty 1

## 2018-02-21 MED ORDER — DEXTROSE 5 % IV SOLN
3.0000 g | INTRAVENOUS | Status: AC
  Administered 2018-02-21: 3 g via INTRAVENOUS
  Filled 2018-02-21: qty 3

## 2018-02-21 MED ORDER — SODIUM CHLORIDE 0.9% FLUSH
3.0000 mL | Freq: Two times a day (BID) | INTRAVENOUS | Status: DC
  Administered 2018-02-21 – 2018-02-24 (×4): 3 mL via INTRAVENOUS

## 2018-02-21 MED ORDER — AMLODIPINE BESY-BENAZEPRIL HCL 5-10 MG PO CAPS: 1.0000 | ORAL_CAPSULE | Freq: Every day | ORAL | Status: DC

## 2018-02-21 MED ORDER — ONDANSETRON HCL 4 MG/2ML IJ SOLN
INTRAMUSCULAR | Status: AC
  Filled 2018-02-21: qty 2

## 2018-02-21 MED ORDER — CELECOXIB 200 MG PO CAPS
200.0000 mg | ORAL_CAPSULE | Freq: Two times a day (BID) | ORAL | Status: DC
  Administered 2018-02-21 – 2018-02-24 (×6): 200 mg via ORAL
  Filled 2018-02-21 (×6): qty 1

## 2018-02-21 MED ORDER — 0.9 % SODIUM CHLORIDE (POUR BTL) OPTIME
TOPICAL | Status: DC | PRN
  Administered 2018-02-21: 1000 mL

## 2018-02-21 MED ORDER — MENTHOL 3 MG MT LOZG: 1.0000 | LOZENGE | OROMUCOSAL | Status: DC | PRN

## 2018-02-21 MED ORDER — THROMBIN 5000 UNITS EX SOLR
CUTANEOUS | Status: DC | PRN
  Administered 2018-02-21 (×2): 5000 [IU] via TOPICAL

## 2018-02-21 MED ORDER — LIDOCAINE-EPINEPHRINE 0.5 %-1:200000 IJ SOLN
INTRAMUSCULAR | Status: DC | PRN
  Administered 2018-02-21: 4 mL

## 2018-02-21 MED ORDER — MAGNESIUM CITRATE PO SOLN: 1.0000 | Freq: Once | ORAL | Status: DC | PRN

## 2018-02-21 MED ORDER — DEXAMETHASONE SODIUM PHOSPHATE 10 MG/ML IJ SOLN
INTRAMUSCULAR | Status: AC
  Filled 2018-02-21: qty 1

## 2018-02-21 MED ORDER — ONDANSETRON HCL 4 MG PO TABS: 4.0000 mg | ORAL_TABLET | Freq: Four times a day (QID) | ORAL | Status: DC | PRN

## 2018-02-21 MED ORDER — PHENOL 1.4 % MT LIQD: 1.0000 | OROMUCOSAL | Status: DC | PRN

## 2018-02-21 MED ORDER — ONDANSETRON HCL 4 MG/2ML IJ SOLN: 4.0000 mg | Freq: Once | INTRAMUSCULAR | Status: DC | PRN

## 2018-02-21 MED ORDER — LIDOCAINE-EPINEPHRINE 0.5 %-1:200000 IJ SOLN
INTRAMUSCULAR | Status: AC
  Filled 2018-02-21: qty 1

## 2018-02-21 MED ORDER — ONDANSETRON HCL 4 MG/2ML IJ SOLN: 4.0000 mg | Freq: Four times a day (QID) | INTRAMUSCULAR | Status: DC | PRN

## 2018-02-21 MED ORDER — FENTANYL CITRATE (PF) 100 MCG/2ML IJ SOLN
25.0000 ug | INTRAMUSCULAR | Status: DC | PRN
  Administered 2018-02-21 (×3): 50 ug via INTRAVENOUS

## 2018-02-21 MED ORDER — ACETAMINOPHEN 325 MG PO TABS
650.0000 mg | ORAL_TABLET | ORAL | Status: DC | PRN
  Administered 2018-02-23: 650 mg via ORAL
  Filled 2018-02-21: qty 2

## 2018-02-21 MED ORDER — THROMBIN 5000 UNITS EX SOLR
OROMUCOSAL | Status: DC | PRN
  Administered 2018-02-21: 18:00:00

## 2018-02-21 MED ORDER — FINASTERIDE 5 MG PO TABS
5.0000 mg | ORAL_TABLET | Freq: Every day | ORAL | Status: DC
  Administered 2018-02-22 – 2018-02-24 (×3): 5 mg via ORAL
  Filled 2018-02-21 (×3): qty 1

## 2018-02-21 MED ORDER — TAMSULOSIN HCL 0.4 MG PO CAPS
0.4000 mg | ORAL_CAPSULE | Freq: Every day | ORAL | Status: DC
  Administered 2018-02-21 – 2018-02-24 (×4): 0.4 mg via ORAL
  Filled 2018-02-21 (×4): qty 1

## 2018-02-21 MED ORDER — MIDAZOLAM HCL 2 MG/2ML IJ SOLN
INTRAMUSCULAR | Status: DC | PRN
  Administered 2018-02-21: 2 mg via INTRAVENOUS

## 2018-02-21 MED ORDER — DEXAMETHASONE 4 MG PO TABS
4.0000 mg | ORAL_TABLET | Freq: Four times a day (QID) | ORAL | Status: AC
  Administered 2018-02-22 (×3): 4 mg via ORAL
  Filled 2018-02-21 (×3): qty 1

## 2018-02-21 MED ORDER — AMLODIPINE BESYLATE 5 MG PO TABS
5.0000 mg | ORAL_TABLET | Freq: Every day | ORAL | Status: DC
  Administered 2018-02-21 – 2018-02-24 (×4): 5 mg via ORAL
  Filled 2018-02-21 (×4): qty 1

## 2018-02-21 MED ORDER — MIDAZOLAM HCL 2 MG/2ML IJ SOLN
INTRAMUSCULAR | Status: AC
  Filled 2018-02-21: qty 2

## 2018-02-21 SURGICAL SUPPLY — 48 items
BUR DRUM 4.0 (BURR) ×2 IMPLANT
BUR MATCHSTICK NEURO 3.0 LAGG (BURR) ×2 IMPLANT
CANISTER SUCT 3000ML PPV (MISCELLANEOUS) ×2 IMPLANT
CARTRIDGE OIL MAESTRO DRILL (MISCELLANEOUS) ×1 IMPLANT
COVER WAND RF STERILE (DRAPES) ×2 IMPLANT
DECANTER SPIKE VIAL GLASS SM (MISCELLANEOUS) ×2 IMPLANT
DERMABOND ADVANCED (GAUZE/BANDAGES/DRESSINGS) ×1
DERMABOND ADVANCED .7 DNX12 (GAUZE/BANDAGES/DRESSINGS) ×1 IMPLANT
DIFFUSER DRILL AIR PNEUMATIC (MISCELLANEOUS) ×2 IMPLANT
DRAPE C-ARM 35X43 STRL (DRAPES) ×4 IMPLANT
DRAPE HALF SHEET 40X57 (DRAPES) ×2 IMPLANT
DRAPE LAPAROTOMY 100X72 PEDS (DRAPES) ×2 IMPLANT
DRAPE MICROSCOPE LEICA (MISCELLANEOUS) ×2 IMPLANT
DURAPREP 6ML APPLICATOR 50/CS (WOUND CARE) ×2 IMPLANT
ELECT COATED BLADE 2.86 ST (ELECTRODE) ×2 IMPLANT
ELECT REM PT RETURN 9FT ADLT (ELECTROSURGICAL) ×2
ELECTRODE REM PT RTRN 9FT ADLT (ELECTROSURGICAL) ×1 IMPLANT
GAUZE 4X4 16PLY RFD (DISPOSABLE) IMPLANT
GLOVE ECLIPSE 6.5 STRL STRAW (GLOVE) ×4 IMPLANT
GLOVE EXAM NITRILE XL STR (GLOVE) IMPLANT
GLOVE SURG SS PI 6.5 STRL IVOR (GLOVE) ×4 IMPLANT
GOWN STRL REUS W/ TWL LRG LVL3 (GOWN DISPOSABLE) ×3 IMPLANT
GOWN STRL REUS W/ TWL XL LVL3 (GOWN DISPOSABLE) IMPLANT
GOWN STRL REUS W/TWL 2XL LVL3 (GOWN DISPOSABLE) IMPLANT
GOWN STRL REUS W/TWL LRG LVL3 (GOWN DISPOSABLE) ×3
GOWN STRL REUS W/TWL XL LVL3 (GOWN DISPOSABLE)
KIT BASIN OR (CUSTOM PROCEDURE TRAY) ×2 IMPLANT
KIT TURNOVER KIT B (KITS) ×2 IMPLANT
NEEDLE HYPO 25X1 1.5 SAFETY (NEEDLE) ×2 IMPLANT
NEEDLE SPNL 22GX3.5 QUINCKE BK (NEEDLE) ×6 IMPLANT
NS IRRIG 1000ML POUR BTL (IV SOLUTION) ×2 IMPLANT
OIL CARTRIDGE MAESTRO DRILL (MISCELLANEOUS) ×2
PACK LAMINECTOMY NEURO (CUSTOM PROCEDURE TRAY) ×2 IMPLANT
PAD ARMBOARD 7.5X6 YLW CONV (MISCELLANEOUS) ×6 IMPLANT
PLATE HELIX R 42MM 2LVL (Plate) ×2 IMPLANT
RUBBERBAND STERILE (MISCELLANEOUS) ×4 IMPLANT
SCREW 4.0X13 (Screw) ×6 IMPLANT
SCREW 4.0X13MM (Screw) ×6 IMPLANT
SPACER ACF PARALLEL 7MM (Bone Implant) ×2 IMPLANT
SPACER CC-ACF 8MM PARALLEL (Bone Implant) ×2 IMPLANT
SPONGE INTESTINAL PEANUT (DISPOSABLE) ×2 IMPLANT
SPONGE SURGIFOAM ABS GEL SZ50 (HEMOSTASIS) ×2 IMPLANT
SUT VIC AB 0 CT1 27 (SUTURE)
SUT VIC AB 0 CT1 27XBRD ANTBC (SUTURE) IMPLANT
SUT VIC AB 3-0 SH 8-18 (SUTURE) ×4 IMPLANT
TOWEL GREEN STERILE (TOWEL DISPOSABLE) ×2 IMPLANT
TOWEL GREEN STERILE FF (TOWEL DISPOSABLE) ×2 IMPLANT
WATER STERILE IRR 1000ML POUR (IV SOLUTION) ×2 IMPLANT

## 2018-02-21 NOTE — H&P (Signed)
BP (!) 147/71   Pulse 70   Temp 98.8 F (37.1 C) (Oral)   Resp 20   Ht 5\' 8"  (1.727 m)   Wt (!) 153.6 kg   SpO2 98%   BMI 51.49 kg/m   Eric Cruz has been having problems with pain in his upper extremities and neck now for 2 weeks.  He said he was lifting some steel from his pickup and then he had acute onset of pain in his neck and left upper extremity, which then extended into the right upper extremity.  He is 67 years old and states that he has had problems with his neck ever since he was wrestling in high school.  He has been seen by a chiropractor in the past, but that was of no great benefit.  But he also was not in severe pain as he has been, however, over the last 2 weeks.  He describes the pain as slightly hyperpathic.  He says he can barely stand the right hand and right upper extremity being touched.  He is 5 feet 7 inches.  His weighs 349 pounds.  Temperature is 96.4, blood pressure is 130/75, pulse is 86.  He has had a history of prostate cancer, hypertension.  He used to smoke, currently no longer.  He is a retired Cabin crew, right-handed.  He is not taking any medication that he lists.  His mother is 28.  She has cancer.  Father is deceased, he had diabetes.  He says the pain is worse now.   REVIEW OF SYSTEMS: Positive for fever, night sweats, infections, balance problems, sinus problems, sore throat, mouth sores, hypercholesterolemia, swelling in the feet, leg pain with walking, neck pain, arm weakness, leg weakness, back pain, arm pain, leg pain, joint pain, depression, excessive thirst.   EXAM: He is alert, oriented by 4, answering all questions appropriately.  Memory, language, attention span, and fund of knowledge normal.  Speech is clear, it is also fluent.  Hearing is intact to voice bilaterally.  Uvula elevates in midline.  Shoulder shrug is normal.  Tongue protrudes in midline.  He is hyperreflexic at the knees, ankles, biceps, triceps, and brachioradialis.  He has spread  with finger flexion on the brachioradialis reflex.  Mild Hoffmann sign bilaterally.  No clonus elicited.  Romberg is negative.  He has some difficulties tandem walking.      On MRI, he has two levels, C5-6 and C6-7 where there is canal compromise to a degree. At C6-7 he has a large disc eccentric to the left side and I would presume that this is the reason for the left upper extremity pain.  He is myelopathic and this has only been progressing.  The discs, however, do not look that big, but you cannot deny the physical exam.      He would simply like to get this treated.  Given that, we can take him to the operating room next week Friday for an ACDF at C5-6 and 6-7.  Risks and benefits, bleeding, infection, paralysis, fusion failure, hardware failure, degenerative changes at the adjacent levels were discussed along with other risks.  He received a detailed instruction sheet and knows to contact me about any further questions he has.  But, as of now, we will plan on Friday of next week.

## 2018-02-21 NOTE — Op Note (Signed)
02/21/2018  6:56 PM  PATIENT:  Eric Cruz  67 y.o. 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 He was positioned supine with his head in slight extension on a horseshoe headrest. The neck was prepped and draped in a sterile manner. I infiltrated 4 cc's 1/2%lidocaine/1:200,000 strength epinephrine into the planned incision starting from the midline to the medial border of the left sternocleidomastoid muscle. I opened the incision with a 10 blade and dissected sharply through soft tissue to the platysma. I dissected in the plane superior to the platysma both rostrally and caudally. I then opened the platysma in a horizontal fashion with Metzenbaum scissors, and dissected in the inferior plane rostrally and caudally. With both blunt and sharp technique I created an avascular corridor to the cervical spine. I placed a spinal needle(s) in the disc space at C4/5 . I then reflected the longus colli from C5 to C7 and placed self retaining retractors. I opened the disc space(s) at C5/6,6/7 with a 15 blade. I removed disc with curettes,  Kerrison punches, and the drill. Using the drill I removed osteophytes and prepared for the decompression.  I decompressed the spinal canal and the C6, and 7 root(s) with the drill, Kerrison punches, and the curettes. I used the microscope to aid in microdissection. I removed the posterior longitudinal ligament to fully expose and decompress the thecal sac. I exposed the roots laterally taking down the 5/6,6/7 uncovertebral joints. With the decompression complete we moved on to the arthrodesis. We used the drill to level the surfaces of C5,6,and 7. I removed soft tissue to prepare the disc space and the bony surfaces. I measured the space and placed a 75mm structural allograft into the C5/6 disc space, and an 30mm graft at C6/7.  We then placed the anterior instrumentation. I placed 2 screws in each vertebral body through the plate. I locked the screws into place. Intraoperative xray showed the graft, plate, and screws to be in good position. I irrigated the wound, achieved hemostasis, and closed the wound in layers. I approximated the platysma, and the subcuticular plane with vicryl sutures. I used Dermabond for a sterile dressing.   PLAN OF CARE: Admit to inpatient   PATIENT DISPOSITION:  PACU - hemodynamically stable.   Delay start of Pharmacological VTE agent (>24hrs) due to surgical blood loss or risk of bleeding:  yes

## 2018-02-21 NOTE — Anesthesia Procedure Notes (Signed)
Procedure Name: Intubation Date/Time: 02/21/2018 3:56 PM Performed by: Teressa Lower., CRNA Pre-anesthesia Checklist: Patient identified, Emergency Drugs available, Suction available, Patient being monitored and Timeout performed Patient Re-evaluated:Patient Re-evaluated prior to induction Oxygen Delivery Method: Circle system utilized Preoxygenation: Pre-oxygenation with 100% oxygen Induction Type: IV induction Ventilation: Mask ventilation without difficulty Laryngoscope Size: Glidescope and 4 Grade View: Grade I Tube type: Oral Tube size: 7.5 mm Number of attempts: 1 Airway Equipment and Method: Stylet,  Video-laryngoscopy and Rigid stylet Placement Confirmation: ETT inserted through vocal cords under direct vision,  positive ETCO2 and breath sounds checked- equal and bilateral Secured at: 23 cm Tube secured with: Tape Dental Injury: Teeth and Oropharynx as per pre-operative assessment

## 2018-02-21 NOTE — Progress Notes (Signed)
Report received from PACU RN. Awaiting pt transfer from PACU to 4NP03.

## 2018-02-21 NOTE — Transfer of Care (Signed)
Immediate Anesthesia Transfer of Care Note  Patient: Eric Cruz  Procedure(s) Performed: Cervical Five-Six Cervical Six-Seven Anterior cervical decompression/discectomy/fusion (N/A )  Patient Location: PACU  Anesthesia Type:General  Level of Consciousness: awake, alert  and oriented  Airway & Oxygen Therapy: Patient Spontanous Breathing and Patient connected to face mask oxygen  Post-op Assessment: Report given to RN and Post -op Vital signs reviewed and stable  Post vital signs: Reviewed and stable  Last Vitals:  Vitals Value Taken Time  BP 166/78 02/21/2018  7:01 PM  Temp    Pulse 108 02/21/2018  7:01 PM  Resp 25 02/21/2018  7:02 PM  SpO2 98 % 02/21/2018  7:01 PM  Vitals shown include unvalidated device data.  Last Pain:  Vitals:   02/21/18 1229  TempSrc:   PainSc: 0-No pain      Patients Stated Pain Goal: 3 (15/05/69 7948)  Complications: No apparent anesthesia complications

## 2018-02-21 NOTE — Anesthesia Postprocedure Evaluation (Signed)
Anesthesia Post Note  Patient: Eric Cruz  Procedure(s) Performed: Cervical Five-Six Cervical Six-Seven Anterior cervical decompression/discectomy/fusion (N/A )     Patient location during evaluation: PACU Anesthesia Type: General Level of consciousness: sedated and patient cooperative Pain management: pain level controlled Vital Signs Assessment: post-procedure vital signs reviewed and stable Respiratory status: spontaneous breathing Cardiovascular status: stable Anesthetic complications: no    Last Vitals:  Vitals:   02/21/18 2100 02/21/18 2115  BP: (!) 158/76 (!) 160/84  Pulse: 87 87  Resp: 18 (!) 23  Temp:    SpO2: 96% 97%    Last Pain:  Vitals:   02/21/18 2005  TempSrc:   PainSc: [place]

## 2018-02-21 NOTE — Progress Notes (Signed)
Pt transfer from PACU to 4NP03 in bed on cardiac monitor, A/Ox4, 1 PIV in place with NSw/ 20 K infusing. Pt reporting pain at incision site. Care assumed at this time.

## 2018-02-22 ENCOUNTER — Encounter (HOSPITAL_COMMUNITY): Payer: Self-pay | Admitting: Neurosurgery

## 2018-02-22 ENCOUNTER — Other Ambulatory Visit: Payer: Self-pay

## 2018-02-22 DIAGNOSIS — M50022 Cervical disc disorder at C5-C6 level with myelopathy: Secondary | ICD-10-CM | POA: Diagnosis not present

## 2018-02-22 DIAGNOSIS — K219 Gastro-esophageal reflux disease without esophagitis: Secondary | ICD-10-CM | POA: Diagnosis not present

## 2018-02-22 DIAGNOSIS — I10 Essential (primary) hypertension: Secondary | ICD-10-CM | POA: Diagnosis not present

## 2018-02-22 DIAGNOSIS — M4802 Spinal stenosis, cervical region: Secondary | ICD-10-CM | POA: Diagnosis not present

## 2018-02-22 DIAGNOSIS — Z8546 Personal history of malignant neoplasm of prostate: Secondary | ICD-10-CM | POA: Diagnosis not present

## 2018-02-22 DIAGNOSIS — Z87891 Personal history of nicotine dependence: Secondary | ICD-10-CM | POA: Diagnosis not present

## 2018-02-22 MED ORDER — FAMOTIDINE 20 MG PO TABS
20.0000 mg | ORAL_TABLET | Freq: Two times a day (BID) | ORAL | Status: DC
  Administered 2018-02-22 – 2018-02-24 (×4): 20 mg via ORAL
  Filled 2018-02-22 (×4): qty 1

## 2018-02-22 MED FILL — Thrombin (Recombinant) For Soln 5000 Unit: CUTANEOUS | Qty: 5000 | Status: AC

## 2018-02-22 NOTE — Progress Notes (Signed)
Patient ID: Eric Cruz, male   DOB: [birth year], 67 y.o.   MRN: [id number] BP 126/78 (BP Location: Left Arm)   Pulse 68   Temp 97.7 F (36.5 C) (Oral)   Resp (!) 23   Ht 5\' 8"  (1.727 m)   Wt (!) 154.2 kg   SpO2 98%   BMI 51.69 kg/m  Alert and oriented x 4, speech is clear and fluent Moving all extremities well Painful swallowing, will start pepcid Wound is clean dry, no signs of infection

## 2018-02-22 NOTE — Evaluation (Signed)
Occupational Therapy Evaluation Patient Details Name: Eric Cruz MRN: [id number] DOB: [birth year] Today's Date: 02/22/2018    History of Present Illness 67 yo admitted with LUE and neck pain for ACDF C5-7   Clinical Impression   PTA Pt independent in ADL and mobility (reports pre-op BUE weakness and decreased function). 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 Pt is currently reporting improved strength and function in BUE strength grossly 4/5 - able to perform sink level grooming at supervision level. Pt educated on cervical biomechanics and precautions - however required mod cues throughout session to maintain. Pt will benefit from skilled OT in the acute setting prior to dc home with intermittent supervision to maximize safety and independence in ADL and functional transfers.     Follow Up Recommendations  Supervision - Intermittent;No OT follow up    Equipment Recommendations  Other (comment)(AE - sock donner (provided))    Recommendations for Other Services       Precautions / Restrictions Precautions Precautions: Cervical Precaution Booklet Issued: Yes (comment) Precaution Comments: reviewed handout with ADL application Restrictions Weight Bearing Restrictions: No      Mobility Bed Mobility               General bed mobility comments: NT this session; Pt OOB in recliner at beginning and end of session  Transfers Overall transfer level: Needs assistance Equipment used: None Transfers: Sit to/from Stand Sit to Stand: Supervision         General transfer comment: good hand placement, supervision for safety    Balance Overall balance assessment: Mild deficits observed, not formally tested                                         ADL either performed or assessed with clinical judgement   ADL Overall ADL's : Needs assistance/impaired Eating/Feeding: Independent   Grooming: Min guard;Standing;Wash/dry hands Grooming Details  (indicate cue type and reason): cues for cervical precautions during functional grooming tasks - education provided for compensatory strategies Upper Body Bathing: Set up;Standing   Lower Body Bathing: Minimal assistance;Sit to/from stand   Upper Body Dressing : Set up;Sitting   Lower Body Dressing: Moderate assistance;With adaptive equipment;Sit to/from stand Lower Body Dressing Details (indicate cue type and reason): able to perform sit<>stand without assist, educated on sock donner - Pt provided with bariatric. Pt has a long handle shoe horn at home that he uses Toilet Transfer: Supervision/safety;Ambulation   Toileting- Clothing Manipulation and Hygiene: Supervision/safety;Sit to/from stand Toileting - Clothing Manipulation Details (indicate cue type and reason): Pt reports that he applies cream to his rear peri area due to skin sensitivity due to decreased skin integrity Tub/ Shower Transfer: Supervision/safety;Ambulation   Functional mobility during ADLs: Supervision/safety(functional distances within room) General ADL Comments: decreased safety awareness for precautions - requiring mod cues. Pt educated on compensatory strategies for ADL to maintain cervical precautions     Vision Patient Visual Report: No change from baseline       Perception     Praxis      Pertinent Vitals/Pain Pain Assessment: Faces Faces Pain Scale: Hurts a little bit Pain Location: sore throat Pain Descriptors / Indicators: Sore Pain Intervention(s): Limited activity within patient's tolerance;Monitored during session     Hand Dominance Right   Extremity/Trunk Assessment Upper Extremity Assessment Upper Extremity Assessment: Generalized weakness(grossly 4/5 for BUE - Pt states this is improved from  PTA)   Lower Extremity Assessment Lower Extremity Assessment: Defer to PT evaluation   Cervical / Trunk Assessment Cervical / Trunk Assessment: Other exceptions Cervical / Trunk Exceptions: post  cervical fusion   Communication Communication Communication: No difficulties   Cognition Arousal/Alertness: Awake/alert Behavior During Therapy: WFL for tasks assessed/performed Overall Cognitive Status: Within Functional Limits for tasks assessed                                     General Comments  Pt on 2L O2 when OT arrived, removed [place] and Pt's saturations remains above 92 throughout    Exercises     Shoulder Instructions      Home Living Family/patient expects to be discharged to:: Private residence Living Arrangements: Parent(performs IADL for mother ) Available Help at Discharge: Family;Available PRN/intermittently Type of Home: House Home Access: Level entry     Home Layout: One level     Bathroom Shower/Tub: Teacher, early years/pre: Handicapped height     Home Equipment: Environmental consultant - 2 wheels;Cane - single point;Crutches;Bedside commode          Prior Functioning/Environment Level of Independence: Independent        Comments: pt is caregiver 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        OT Problem List: Decreased activity tolerance;Impaired balance (sitting and/or standing);Decreased safety awareness;Decreased knowledge of use of DME or AE;Decreased knowledge of precautions;Obesity;Pain;Impaired UE functional use      OT Treatment/Interventions: Self-care/ADL training;DME and/or AE instruction;Therapeutic activities;Patient/family education;Balance training    OT Goals(Current goals can be found in the care plan section) Acute Rehab OT Goals Patient Stated Goal: to get independent to take care of mother again` OT Goal Formulation: With patient Time For Goal Achievement: 03/08/18 Potential to Achieve Goals: Good ADL Goals Pt Will Perform Grooming: with modified independence;standing Pt Will Perform Upper Body Dressing: with modified independence;standing Pt Will Perform Lower Body Dressing: with modified independence;sit to/from stand Pt Will  Perform Tub/Shower Transfer: with modified independence;ambulating;Tub transfer Additional ADL Goal #1: Pt will perform bed mobility at mod I level prior to engaging in ADL activity  OT Frequency: Min 2X/week   Barriers to D/C:            Co-evaluation              AM-PAC PT "6 Clicks" Daily Activity     Outcome Measure Help from another person eating meals?: None Help from another person taking care of personal grooming?: A Little Help from another person toileting, which includes using toliet, bedpan, or urinal?: A Little Help from another person bathing (including washing, rinsing, drying)?: A Little Help from another person to put on and taking off regular upper body clothing?: None Help from another person to put on and taking off regular lower body clothing?: A Little 6 Click Score: 20   End of Session Nurse Communication: Mobility status;Other (comment)(O2 removed and remained off due to good saturations)  Activity Tolerance: Patient tolerated treatment well Patient left: in chair;with call bell/phone within reach  OT Visit Diagnosis: Unsteadiness on feet (R26.81);Pain Pain - part of body: (cervical/neck)                Time: 1501-1530 OT Time Calculation (min): 29 min Charges:  OT General Charges $OT Visit: 1 Visit OT Evaluation $OT Eval Moderate Complexity: 1 Mod OT Treatments $Self Care/Home Management : 8-22 mins  Hulda Humphrey  OTR/L Acute Rehabilitation Services Pager: [phone] Office: [place] 02/22/2018, 4:04 PM

## 2018-02-22 NOTE — Evaluation (Signed)
Clinical/Bedside Swallow Evaluation Patient Details  Name: Eric Cruz MRN: [id number] Date of Birth: 08/13/1950  Today's Date: 02/22/2018 Time: SLP Start Time (ACUTE ONLY): 0930 SLP Stop Time (ACUTE ONLY): 1020 SLP Time Calculation (min) (ACUTE ONLY): 50 min  Past Medical History:  Past Medical History:  Diagnosis Date  . Arthritis   . BPH (benign prostatic hyperplasia)   . Cancer [facility])    Prostate: states he had a positive biopsy but had another one a year later and it was gone.   . Depression   . Dizziness   . GERD (gastroesophageal reflux disease)   . Gout   . High cholesterol   . Hypertension   . Sleep apnea    has cpap - not currently wearing  . Varicose veins    Past Surgical History:  Past Surgical History:  Procedure Laterality Date  . BLADDER SURGERY    . CHOLECYSTECTOMY  11/22/2012  . CHOLECYSTECTOMY  11/22/2012   Procedure: LAPAROSCOPIC CHOLECYSTECTOMY;  Surgeon: Gwenyth Ober, MD;  Location: [place];  Service: General;;  . COLONOSCOPY     HPI:  Eric Cruz  67 y.o. 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 Dx of herniated nucleus pulposus with myelopathy. Underwent ACDF on C5-7, arthrodesis C5/6, 6/7 with 7 mmstructural allograft at C5/6, 65mm at C6/7, anterior instrumentation C5/7 on 11/21. Dr. Lacy Duverney order states "Patient states he had problems swallowing before surgery, c/o something being stuck in throat, stated it's 2x worse after surgery and states he can't swallow."   Assessment / Plan / Recommendation Clinical Impression  Pt demonstrates adequate oral function, given subjective assessment, pharyngeal function also appears adequate; there are no signs of residue or aspiration. Pt does report pain in throat with swallow, which is to be expected given surgical site.   Primary problem appears to be a baseline, quite significant, report of GER. Pt is not taking anything more than Tums 3x a day despite report of  burning in his chest, food getting stuck, waking up coughing on fluid coming up into his mouth at night. Pt is at high risk of post prandial aspiration and aspiration of night-time reflux given obesity and sleep apnea. For now, we discussed avoiding dry, difficult to chew foods until his throat feels better. Otherwise pt would benefit from medical management of GER/GERD to reduce discomfort and reduce risk of complications. Referral to GI may be appropriate given severity. No acute SLP interventions needed at this time.    SLP Visit Diagnosis: Dysphagia, unspecified (R13.10)    Aspiration Risk  Moderate aspiration risk    Diet Recommendation Regular;Thin liquid   Liquid Administration via: Cup;Straw Medication Administration: Whole meds with liquid Supervision: Patient able to self feed Compensations: Follow solids with liquid Postural Changes: Seated upright at 90 degrees;Remain upright for at least 30 minutes after po intake    Other  Recommendations Recommended Consults: Consider GI evaluation;Consider esophageal assessment Oral Care Recommendations: Oral care BID   Follow up Recommendations        Frequency and Duration            Prognosis        Swallow Study   General HPI: Eric Cruz  67 y.o. 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 Dx of herniated nucleus pulposus with myelopathy. Underwent ACDF on C5-7, arthrodesis C5/6, 6/7 with 7 mmstructural allograft at C5/6, 31mm at C6/7, anterior instrumentation C5/7 on  11/21. Dr. Lacy Duverney order states "Patient states he had problems swallowing before surgery, c/o something being stuck in throat, stated it's 2x worse after surgery and states he can't swallow." Type of Study: Bedside Swallow Evaluation Previous Swallow Assessment: none Diet Prior to this Study: Thin liquids Temperature Spikes Noted: No Respiratory Status: Room air History of Recent Intubation: No Behavior/Cognition:  Alert;Cooperative;Pleasant mood Oral Cavity Assessment: Within Functional Limits Oral Care Completed by SLP: No Oral Cavity - Dentition: Missing dentition Vision: Functional for self-feeding Self-Feeding Abilities: Able to feed self Patient Positioning: Upright in chair Baseline Vocal Quality: Low vocal intensity;Hoarse Volitional Cough: Strong Volitional Swallow: Able to elicit    Oral/Motor/Sensory Function Overall Oral Motor/Sensory Function: Within functional limits   Ice Chips     Thin Liquid Thin Liquid: Within functional limits Presentation: Cup;Straw;Self Fed    Nectar Thick Nectar Thick Liquid: Not tested   Honey Thick Honey Thick Liquid: Not tested   Puree Puree: Within functional limits   Solid     Other Comments: globus, pain     Eric [place], MA [place] Pager [phone] Office [phone]  Katniss Weedman, Katherene Ponto 02/22/2018,10:37 AM

## 2018-02-22 NOTE — Evaluation (Signed)
Physical Therapy Evaluation/ Discharge Patient Details Name: Eric Cruz MRN: [id number] DOB: 25-Nov-1950 Today's Date: 02/22/2018   History of Present Illness  67 yo admitted with LUE and neck pain for ACDF C5-7  Clinical Impression  Pt very pleasant and reports neck pain with difficulty swallowing. Pt able to grip with 5/5 strength, lift bil UE to shoulder height and perform basic mobility and gait. Pt reports bil UE pain and weakness and will defer to OT . 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 Pt educated for all precautions, encouraged to walk daily with nursing and perform side<> with bed mobility. Pt verbalized understanding of education and agreeable to no further needs at this time.     Follow Up Recommendations No PT follow up    Equipment Recommendations  None recommended by PT    Recommendations for Other Services OT consult     Precautions / Restrictions Precautions Precautions: Cervical Precaution Booklet Issued: Yes (comment)      Mobility  Bed Mobility Overal bed mobility: Needs Assistance Bed Mobility: Rolling;Sidelying to Sit Rolling: Min assist Sidelying to sit: Min assist       General bed mobility comments: cues for sequence with assist to elevate trunk   Transfers Overall transfer level: Modified independent                  Ambulation/Gait Ambulation/Gait assistance: Modified independent (Device/Increase time) Gait Distance (Feet): 600 Feet Assistive device: None Gait Pattern/deviations: Step-through pattern;Wide base of support   Gait velocity interpretation: >4.37 ft/sec, indicative of normal walking speed General Gait Details: wide BOS but stable with gait with good speed  Stairs            Wheelchair Mobility    Modified Rankin (Stroke Patients Only)       Balance Overall balance assessment: Mild deficits observed, not formally tested                                            Pertinent Vitals/Pain Pain Assessment: 0-10 Pain Score: 4  Pain Location: sore throat Pain Descriptors / Indicators: Sore Pain Intervention(s): Limited activity within patient's tolerance;Repositioned;Monitored during session    Home Living Family/patient expects to be discharged to:: Private residence Living Arrangements: Parent Available Help at Discharge: Family;Available PRN/intermittently Type of Home: House Home Access: Level entry     Home Layout: One level Home Equipment: Walker - 2 wheels;Cane - single point;Crutches;Bedside commode      Prior Function Level of Independence: Independent         Comments: 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     Hand Dominance        Extremity/Trunk Assessment   Upper Extremity Assessment Upper Extremity Assessment: Defer to OT evaluation    Lower Extremity Assessment Lower Extremity Assessment: Overall WFL for tasks assessed    Cervical / Trunk Assessment Cervical / Trunk Assessment: Other exceptions Cervical / Trunk Exceptions: post cervical fusion  Communication   Communication: No difficulties  Cognition Arousal/Alertness: Awake/alert Behavior During Therapy: WFL for tasks assessed/performed Overall Cognitive Status: Within Functional Limits for tasks assessed                                        General Comments  Exercises     Assessment/Plan    PT Assessment Patent does not need any further PT services(will defer to OT)  PT Problem List         PT Treatment Interventions      PT Goals (Current goals can be found in the Care Plan section)  Acute Rehab PT Goals PT Goal Formulation: All assessment and education complete, DC therapy    Frequency     Barriers to discharge        Co-evaluation               AM-PAC PT "6 Clicks" Daily Activity  Outcome Measure Difficulty turning over in bed (including adjusting bedclothes, sheets and blankets)?: A  Lot Difficulty moving from lying on back to sitting on the side of the bed? : Unable Difficulty sitting down on and standing up from a chair with arms (e.g., wheelchair, bedside commode, etc,.)?: A Little Help needed moving to and from a bed to chair (including a wheelchair)?: A Little Help needed walking in hospital room?: None Help needed climbing 3-5 steps with a railing? : None 6 Click Score: 17    End of Session Equipment Utilized During Treatment: Gait belt Activity Tolerance: Patient tolerated treatment well Patient left: in chair;with call bell/phone within reach Nurse Communication: Mobility status;Precautions PT Visit Diagnosis: Other abnormalities of gait and mobility (R26.89)    Time: 1188-6773 PT Time Calculation (min) (ACUTE ONLY): 23 min   Charges:   PT Evaluation $PT Eval Moderate Complexity: [place] Pager: [phone] Office: [phone]   Nachelle Negrette B Kollen Armenti 02/22/2018, 9:23 AM

## 2018-02-23 DIAGNOSIS — Z8546 Personal history of malignant neoplasm of prostate: Secondary | ICD-10-CM | POA: Diagnosis not present

## 2018-02-23 DIAGNOSIS — M4802 Spinal stenosis, cervical region: Secondary | ICD-10-CM | POA: Diagnosis not present

## 2018-02-23 DIAGNOSIS — M50022 Cervical disc disorder at C5-C6 level with myelopathy: Secondary | ICD-10-CM | POA: Diagnosis not present

## 2018-02-23 DIAGNOSIS — Z87891 Personal history of nicotine dependence: Secondary | ICD-10-CM | POA: Diagnosis not present

## 2018-02-23 DIAGNOSIS — K219 Gastro-esophageal reflux disease without esophagitis: Secondary | ICD-10-CM | POA: Diagnosis not present

## 2018-02-23 DIAGNOSIS — I10 Essential (primary) hypertension: Secondary | ICD-10-CM | POA: Diagnosis not present

## 2018-02-23 NOTE — Progress Notes (Signed)
Patient ID: Eric Cruz, male   DOB: 11-12-1950, 67 y.o.   MRN: [id number] Doing well.  Walking well.  Not much pain.  Dressing dry.  Incision flat.  Good strength throughout.  Doing well with therapy.

## 2018-02-24 DIAGNOSIS — M4802 Spinal stenosis, cervical region: Secondary | ICD-10-CM | POA: Diagnosis not present

## 2018-02-24 DIAGNOSIS — Z8546 Personal history of malignant neoplasm of prostate: Secondary | ICD-10-CM | POA: Diagnosis not present

## 2018-02-24 DIAGNOSIS — Z87891 Personal history of nicotine dependence: Secondary | ICD-10-CM | POA: Diagnosis not present

## 2018-02-24 DIAGNOSIS — K219 Gastro-esophageal reflux disease without esophagitis: Secondary | ICD-10-CM | POA: Diagnosis not present

## 2018-02-24 DIAGNOSIS — I10 Essential (primary) hypertension: Secondary | ICD-10-CM | POA: Diagnosis not present

## 2018-02-24 DIAGNOSIS — M50022 Cervical disc disorder at C5-C6 level with myelopathy: Secondary | ICD-10-CM | POA: Diagnosis not present

## 2018-02-24 MED ORDER — OXYCODONE HCL 5 MG PO TABS: 5.0000 mg | ORAL_TABLET | ORAL | 0 refills | Status: DC | PRN

## 2018-02-24 MED ORDER — FLEET ENEMA 7-19 GM/118ML RE ENEM: 1.0000 | ENEMA | Freq: Every day | RECTAL | Status: DC | PRN

## 2018-02-24 MED ORDER — METHOCARBAMOL 500 MG PO TABS: 500.0000 mg | ORAL_TABLET | Freq: Four times a day (QID) | ORAL | 0 refills | Status: DC

## 2018-02-24 NOTE — Progress Notes (Signed)
Discharge instructions given to patient. All questions answered. Discharged home with family. Lianne Bushy RN BSN.

## 2018-02-24 NOTE — Discharge Instructions (Signed)

## 2018-02-24 NOTE — Discharge Summary (Signed)
Physician Discharge Summary  Patient ID: Eric Cruz MRN: [id number] DOB/AGE: 08/03/1950 67 y.o.  Admit date: 02/21/2018 Discharge date: 02/24/2018  Admission Diagnoses:  Discharge Diagnoses:  Active Problems:   HNP (herniated nucleus pulposus) with myelopathy, cervical   Discharged Condition: good  Hospital Course: Patient admitted to the hospital where he underwent uncomplicated anterior cervical decompression and fusion.  Postoperative patient doing well.  Preoperative neck and upper extremity symptoms improved.  Standing and ambulating without difficulty.  Patient ready for discharge home.  Consults:   Significant Diagnostic Studies:   Treatments:   Discharge Exam: Blood pressure 136/78, pulse 72, temperature 97.7 F (36.5 C), temperature source Oral, resp. rate (!) 23, height 5\' 8"  (1.727 m), weight (!) 154.2 kg, SpO2 97 %. Awake and alert.  Oriented and appropriate.  Speech fluent.  Neck soft.  Wound clean and dry.  Motor and sensory function stable.  Chest and abdomen benign.  Disposition: Discharge disposition: 01-Home or Self Care        Allergies as of 02/24/2018      Reactions   Other    Can only tolerate oxygen on a low level   Statins    Arms ache      Medication List    STOP taking these medications   HYDROcodone-acetaminophen 5-325 MG tablet Commonly known as:  NORCO/VICODIN     TAKE these medications   amLODipine-benazepril 5-10 MG capsule Commonly known as:  LOTREL Take 1 capsule by mouth daily.   finasteride 5 MG tablet Commonly known as:  PROSCAR Take 5 mg by mouth daily.   furosemide 40 MG tablet Commonly known as:  LASIX Take 40 mg by mouth daily.   methocarbamol 500 MG tablet Commonly known as:  ROBAXIN Take 1 tablet (500 mg total) by mouth 4 (four) times daily.   oxyCODONE 5 MG immediate release tablet Commonly known as:  Oxy IR/ROXICODONE Take 1 tablet (5 mg total) by mouth every 3 (three) hours as needed for  moderate pain ((score 4 to 6)).   pantoprazole 20 MG tablet Commonly known as:  PROTONIX Take 2 tablets (40 mg total) by mouth daily.   tamsulosin 0.4 MG Caps capsule Commonly known as:  FLOMAX Take 0.4 mg by mouth daily after supper.        Signed: Cooper Render Nahla Lukin 02/24/2018, 10:13 AM

## 2018-03-10 ENCOUNTER — Encounter (HOSPITAL_COMMUNITY): Payer: Self-pay

## 2018-03-10 ENCOUNTER — Emergency Department (HOSPITAL_COMMUNITY)
Admission: EM | Admit: 2018-03-10 | Discharge: 2018-03-11 | Disposition: A | Discharge status: Home / Self Care | Payer: Federal, State, Local not specified - PPO | Attending: Emergency Medicine | Admitting: Emergency Medicine

## 2018-03-10 ENCOUNTER — Other Ambulatory Visit: Payer: Self-pay

## 2018-03-10 DIAGNOSIS — Z87891 Personal history of nicotine dependence: Secondary | ICD-10-CM | POA: Diagnosis not present

## 2018-03-10 DIAGNOSIS — R600 Localized edema: Secondary | ICD-10-CM | POA: Insufficient documentation

## 2018-03-10 DIAGNOSIS — J984 Other disorders of lung: Secondary | ICD-10-CM | POA: Diagnosis not present

## 2018-03-10 DIAGNOSIS — Z9889 Other specified postprocedural states: Secondary | ICD-10-CM | POA: Diagnosis not present

## 2018-03-10 DIAGNOSIS — L02415 Cutaneous abscess of right lower limb: Secondary | ICD-10-CM | POA: Diagnosis not present

## 2018-03-10 DIAGNOSIS — Z79899 Other long term (current) drug therapy: Secondary | ICD-10-CM | POA: Insufficient documentation

## 2018-03-10 DIAGNOSIS — L0291 Cutaneous abscess, unspecified: Secondary | ICD-10-CM

## 2018-03-10 DIAGNOSIS — Z8614 Personal history of Methicillin resistant Staphylococcus aureus infection: Secondary | ICD-10-CM | POA: Diagnosis not present

## 2018-03-10 DIAGNOSIS — I10 Essential (primary) hypertension: Secondary | ICD-10-CM | POA: Diagnosis not present

## 2018-03-10 NOTE — ED Provider Notes (Signed)
[facility] EMERGENCY DEPARTMENT Provider Note   CSN: [id number] Arrival date & time: 03/10/18  2139     History   Chief Complaint Chief Complaint  Patient presents with  . Abscess    HPI Eric Cruz is a 67 y.o. male presenting with an abscess on his right lateral thigh onset yesterday morning. Patient reports pain is intermittent and describes pain as pressure. Patient states he has not tried anything for the pain. Patient reports he also noticed bilateral leg edema today. Patient reports he has had abdominal distention for a year. Patient states he has had abscess in the past and has a history of MRSA. Patient states he had cervical spine surgery 3 weeks ago. Patient states he has been ambulating without difficulty.   HPI  Past Medical History:  Diagnosis Date  . Arthritis   . BPH (benign prostatic hyperplasia)   . Cancer [facility])    Prostate: states he had a positive biopsy but had another one a year later and it was gone.   . Depression   . Dizziness   . GERD (gastroesophageal reflux disease)   . Gout   . High cholesterol   . Hypertension   . Sleep apnea    has cpap - not currently wearing  . Varicose veins     Patient Active Problem List   Diagnosis Date Noted  . HNP (herniated nucleus pulposus) with myelopathy, cervical 02/21/2018  . Postop check 12/10/2012  . Symptomatic cholelithiasis 11/15/2012    Past Surgical History:  Procedure Laterality Date  . ANTERIOR CERVICAL DECOMP/DISCECTOMY FUSION N/A 02/21/2018   Procedure: Cervical Five-Six Cervical Six-Seven Anterior cervical decompression/discectomy/fusion;  Surgeon: Ashok Pall, MD;  Location: [place];  Service: Neurosurgery;  Laterality: N/A;  Cervical Five-Six Cervical Six-Seven Anterior cervical decompression/discectomy/fusion  . BLADDER SURGERY    . CHOLECYSTECTOMY  11/22/2012  . CHOLECYSTECTOMY  11/22/2012   Procedure: LAPAROSCOPIC CHOLECYSTECTOMY;  Surgeon: Gwenyth Ober, MD;   Location: [place];  Service: General;;  . COLONOSCOPY          Home Medications    Prior to Admission medications   Medication Sig Start Date End Date Taking? Authorizing Provider  amLODipine-benazepril (LOTREL) 5-10 MG per capsule Take 1 capsule by mouth daily.   Yes [provider]  finasteride (PROSCAR) 5 MG tablet Take 5 mg by mouth daily.   Yes [provider]  furosemide (LASIX) 40 MG tablet Take 40 mg by mouth daily.   Yes [provider]  methocarbamol (ROBAXIN) 500 MG tablet Take 1 tablet (500 mg total) by mouth 4 (four) times daily. 02/24/18  Yes Pool, Mallie Mussel, MD  oxyCODONE (OXY IR/ROXICODONE) 5 MG immediate release tablet Take 1 tablet (5 mg total) by mouth every 3 (three) hours as needed for moderate pain ((score 4 to 6)). 02/24/18  Yes Pool, Mallie Mussel, MD  tamsulosin (FLOMAX) 0.4 MG CAPS capsule Take 0.4 mg by mouth daily after supper.   Yes [provider]  Vitamins A & D (VITAMIN A & D) ointment Apply 1 application topically daily as needed for dry skin.   Yes [provider]  doxycycline (VIBRAMYCIN) 100 MG capsule Take 1 capsule (100 mg total) by mouth 2 (two) times daily. 03/11/18   Darlin Drop P, PA-C  pantoprazole (PROTONIX) 20 MG tablet Take 2 tablets (40 mg total) by mouth daily. Patient not taking: Reported on 02/13/2018 11/11/12   Molpus, Jenny Reichmann, MD    Family History History reviewed. No pertinent family  history.  Social History Social History   Tobacco Use  . Smoking status: Former Smoker    Last attempt to quit: 04/22/1998    Years since quitting: 19.8  . Smokeless tobacco: Never Used  Substance Use Topics  . Alcohol use: No  . Drug use: No     Allergies   Other and Statins   Review of Systems Review of Systems  Constitutional: Negative for chills, diaphoresis and fever.  Respiratory: Negative for cough and shortness of breath.   Cardiovascular: Positive for leg swelling. Negative for chest pain.    Gastrointestinal: Negative for abdominal pain, nausea and vomiting.  Endocrine: Negative for cold intolerance and heat intolerance.  Genitourinary: Negative for dysuria.  Musculoskeletal: Negative for back pain.  Skin: Positive for color change. Negative for rash.       Pt reports abscess on right thigh.  Allergic/Immunologic: Negative for immunocompromised state.  Hematological: Negative for adenopathy.     Physical Exam Updated Vital Signs BP 126/69   Pulse 84   Temp 98.9 F (37.2 C) (Oral)   Resp 14   Ht 5\' 9"  (1.753 m)   Wt (!) 154.2 kg   SpO2 97%   BMI 50.20 kg/m   Physical Exam  Constitutional: He appears well-developed and well-nourished. No distress.  HENT:  Head: Normocephalic and atraumatic.  Cardiovascular: Normal rate, regular rhythm, normal heart sounds and intact distal pulses. Exam reveals no gallop and no friction rub.  No murmur heard. Pulmonary/Chest: Effort normal and breath sounds normal. No respiratory distress. He has no wheezes. He has no rales.  Abdominal: Soft. He exhibits distension. There is no tenderness. There is no guarding.  Musculoskeletal: Normal range of motion.  Neurological: He is alert.  Skin: Skin is warm. No rash noted. He is not diaphoretic. There is erythema.     Psychiatric: He has a normal mood and affect.  Nursing note and vitals reviewed.    ED Treatments / Results  Labs (all labs ordered are listed, but only abnormal results are displayed) Labs Reviewed  BASIC METABOLIC PANEL - Abnormal; Notable for the following components:      Result Value   Glucose, Bld 117 (*)    All other components within normal limits  CBC WITH DIFFERENTIAL/PLATELET - Abnormal; Notable for the following components:   WBC 11.4 (*)    RBC 3.44 (*)    Hemoglobin 9.6 (*)    HCT 31.2 (*)    RDW 15.9 (*)    Neutro Abs 8.6 (*)    All other components within normal limits  HEPATIC FUNCTION PANEL - Abnormal; Notable for the following components:    Albumin 2.9 (*)    AST 11 (*)    All other components within normal limits  BRAIN NATRIURETIC PEPTIDE    EKG EKG Interpretation  Date/Time:  Monday March 11 2018 01:22:09 EST Ventricular Rate:  77 PR Interval:    QRS Duration: 82 QT Interval:  389 QTC Calculation: 441 R Axis:   21 Text Interpretation:  Sinus rhythm Abnormal R-wave progression, early transition Baseline wander in lead(s) I II aVR No significant change was found Confirmed by Ezequiel Essex [phone]) on 03/11/2018 1:27:19 AM   Radiology Dg Chest 2 View  Result Date: 03/11/2018 CLINICAL DATA:  Bilateral leg swelling. EXAM: CHEST - 2 VIEW COMPARISON:  01/10/2018. FINDINGS: Normal sized heart. Clear lungs with normal vascularity. Mild diffuse peribronchial thickening. Thoracic spine degenerative changes and cervical spine fixation hardware. Cholecystectomy clips. IMPRESSION: No acute abnormality.  Mild chronic bronchitic changes. Electronically Signed   By: Claudie Revering M.D.   On: 03/11/2018 01:14    Procedures .Marland KitchenIncision and Drainage Date/Time: 03/11/2018 2:33 AM Performed by: Arville Lime, PA-C Authorized by: Arville Lime, PA-C   Consent:    Consent obtained:  Verbal   Consent given by:  Patient   Risks discussed:  Bleeding, incomplete drainage and infection   Alternatives discussed:  No treatment Location:    Type:  Abscess   Location:  Lower extremity   Lower extremity location: Thigh. Pre-procedure details:    Skin preparation:  Betadine Anesthesia (see MAR for exact dosages):    Anesthesia method:  Local infiltration   Local anesthetic:  Lidocaine 1% w/o epi Procedure type:    Complexity:  Simple Procedure details:    Needle aspiration: no     Incision types:  Stab incision   Incision depth:  Dermal   Scalpel blade:  11   Wound management:  Irrigated with saline   Drainage:  Bloody   Drainage amount:  Moderate   Wound treatment:  Wound left open   Packing materials:   None Post-procedure details:    Patient tolerance of procedure:  Tolerated well, no immediate complications   (including critical care time)  EMERGENCY DEPARTMENT US SOFT TISSUE INTERPRETATION "Study: Limited Soft Tissue Ultrasound"  INDICATIONS: Soft tissue infection Multiple views of the body part were obtained in real-time with a multi-frequency linear probe  PERFORMED BY: Myself IMAGES ARCHIVED?: Yes SIDE:Right  BODY PART:thigh INTERPRETATION:  Cellulitis present and small abscess noted.   Medications Ordered in ED Medications  lidocaine (PF) (XYLOCAINE) 1 % injection 30 mL (30 mLs Infiltration Given by Other 03/11/18 0216)     Initial Impression / Assessment and Plan / ED Course  I have reviewed the triage vital signs and the nursing notes.  Pertinent labs & imaging results that were available during my care of the patient were reviewed by me and considered in my medical decision making (see chart for details).  Clinical Course as of Mar 11 357  Mon Mar 11, 2018  0123 No acute abnormality noted on CXR.   DG Chest 2 View [AH]  0124 Leukocytosis noted at 11.4 likely due to skin abscess and cellulitis on right thigh.   WBC(!): 11.4 [AH]    Clinical Course User Index [AH] Arville Lime, PA-C   Patient presents with complaint of an abscess. Patient nontoxic appearing, in no apparent distress, vitals WNL, stable.  Assessment/Plan: Ordered CXR, BNP, and EKG to evaluate for heart failure due to fluid retention. Findings were negative. Provided I&D of abscess. Will prescribe doxycycline due to history of MRSA for cellulitis. Do not suspect DVT at this time since patient has been ambulating without difficulty and bedside ultrasound revealed patent femoral veins bilaterally.  Discussed findings with patient and he agrees with plan. Instructed patient to return to ER if he experiences new or worsening symptoms.   Doubt need for further emergent work up at this time. I  discussed results, treatment plan, need for PCP follow-up, and return precautions to return to the ER including for any other new or worsening symptoms with the patient. Provided opportunity for questions, patient confirmed understanding and is in agreement with plan. I have answered their questions. Discharge instructions concerning home care and prescriptions have been given. The patient is STABLE and is discharged to home in good condition. Encouraged patient to follow up with PCP and have PCP obtain results of  this visit in 2 or sooner if needed.   Findings and plan of care discussed with supervising physician Dr. Leonette Monarch who personally evaluated and examined this patient.  Final Clinical Impressions(s) / ED Diagnoses   Final diagnoses:  Abscess    ED Discharge Orders         Ordered    sulfamethoxazole-trimethoprim (BACTRIM DS,SEPTRA DS) 800-160 MG tablet  2 times daily,   Status:  Discontinued     03/11/18 0255    doxycycline (VIBRAMYCIN) 100 MG capsule  2 times daily     03/11/18 0330           Arville Lime, PA-C 03/11/18 0358    Fatima Blank, MD 03/11/18 [phone]

## 2018-03-10 NOTE — ED Notes (Signed)
Pt c/o abscess to back of right thigh.  Onset earlier today.  Pt denies drainage

## 2018-03-10 NOTE — ED Triage Notes (Signed)
Pt from home w/ a c/o of pain to the posterior side of his thigh. The pain is constant and began today. It does not increase with movement. Pt had recent surgery (3 weeks ago) on his cervical spine where C5-C7 were removed and a plate and screws were placed. Pt contacted the hospital, spoke with a RN who instructed him to come to the hospital for [place] and evaluation of a blood clot.

## 2018-03-11 ENCOUNTER — Emergency Department (HOSPITAL_COMMUNITY): Payer: Federal, State, Local not specified - PPO

## 2018-03-11 DIAGNOSIS — J984 Other disorders of lung: Secondary | ICD-10-CM | POA: Diagnosis not present

## 2018-03-11 DIAGNOSIS — L02415 Cutaneous abscess of right lower limb: Secondary | ICD-10-CM | POA: Diagnosis not present

## 2018-03-11 LAB — CBC WITH DIFFERENTIAL/PLATELET
Abs Immature Granulocytes: 0.05 10*3/uL (ref 0.00–0.07)
Basophils Absolute: 0 10*3/uL (ref 0.0–0.1)
Basophils Relative: 0 %
Eosinophils Absolute: 0.5 10*3/uL (ref 0.0–0.5)
Eosinophils Relative: 4 %
HCT: 31.2 % — ABNORMAL LOW (ref 39.0–52.0)
Hemoglobin: 9.6 g/dL — ABNORMAL LOW (ref 13.0–17.0)
Immature Granulocytes: 0 %
Lymphocytes Relative: 14 %
Lymphs Abs: 1.6 10*3/uL (ref 0.7–4.0)
MCH: 27.9 pg (ref 26.0–34.0)
MCHC: 30.8 g/dL (ref 30.0–36.0)
MCV: 90.7 fL (ref 80.0–100.0)
Monocytes Absolute: 0.7 10*3/uL (ref 0.1–1.0)
Monocytes Relative: 6 %
Neutro Abs: 8.6 10*3/uL — ABNORMAL HIGH (ref 1.7–7.7)
Neutrophils Relative %: 76 %
Platelets: 233 10*3/uL (ref 150–400)
RBC: 3.44 MIL/uL — ABNORMAL LOW (ref 4.22–5.81)
RDW: 15.9 % — ABNORMAL HIGH (ref 11.5–15.5)
WBC: 11.4 10*3/uL — ABNORMAL HIGH (ref 4.0–10.5)
nRBC: 0 % (ref 0.0–0.2)

## 2018-03-11 LAB — BASIC METABOLIC PANEL
Anion gap: 10 (ref 5–15)
BUN: 10 mg/dL (ref 8–23)
CO2: 25 mmol/L (ref 22–32)
Calcium: 9 mg/dL (ref 8.9–10.3)
Chloride: 101 mmol/L (ref 98–111)
Creatinine, Ser: 0.99 mg/dL (ref 0.61–1.24)
GFR calc Af Amer: >60 mL/min (ref >60)
GFR calc non Af Amer: >60 mL/min (ref >60)
Glucose, Bld: 117 mg/dL — ABNORMAL HIGH (ref 70–99)
Potassium: 4 mmol/L (ref 3.5–5.1)
Sodium: 136 mmol/L (ref 135–145)

## 2018-03-11 LAB — HEPATIC FUNCTION PANEL
ALT: 8 U/L (ref 0–44)
AST: 11 U/L — ABNORMAL LOW (ref 15–41)
Albumin: 2.9 g/dL — ABNORMAL LOW (ref 3.5–5.0)
Alkaline Phosphatase: 75 U/L (ref 38–126)
Bilirubin, Direct: <0.1 mg/dL (ref 0.0–0.2)
Total Bilirubin: 0.4 mg/dL (ref 0.3–1.2)
Total Protein: 7.6 g/dL (ref 6.5–8.1)

## 2018-03-11 LAB — BRAIN NATRIURETIC PEPTIDE: B Natriuretic Peptide: 19.9 pg/mL (ref 0.0–100.0)

## 2018-03-11 MED ORDER — SULFAMETHOXAZOLE-TRIMETHOPRIM 800-160 MG PO TABS: 1.0000 | ORAL_TABLET | Freq: Two times a day (BID) | ORAL | 0 refills | Status: DC

## 2018-03-11 MED ORDER — LIDOCAINE HCL (PF) 1 % IJ SOLN
30.0000 mL | Freq: Once | INTRAMUSCULAR | Status: AC
  Administered 2018-03-11: 30 mL
  Filled 2018-03-11: qty 30

## 2018-03-11 MED ORDER — DOXYCYCLINE HYCLATE 100 MG PO CAPS: 100.0000 mg | ORAL_CAPSULE | Freq: Two times a day (BID) | ORAL | 0 refills | Status: DC

## 2018-03-11 NOTE — Discharge Instructions (Addendum)
You have been seen today for an abscess. Please read and follow all provided instructions.   1. Medications: doxycycline (antibiotic) for skin infection, usual home medications 2. Treatment: rest, drink plenty of fluids 3. Follow Up: Please follow up with your primary doctor in 2 days for discussion of your diagnoses and further evaluation after today's visit; if you do not have a primary care doctor use the resource guide provided to find one; Please return to the ER for any new or worsening symptoms. Please obtain all of your results from medical records or have your doctors office obtain the results - share them with your doctor - you should be seen at your doctors office. Call today to arrange your follow up.   Take medications as prescribed. Please review all of the medicines and only take them if you do not have an allergy to them. Return to the emergency room for worsening condition or new concerning symptoms. Follow up with your regular doctor. If you don't have a regular doctor use one of the numbers below to establish a primary care doctor.  Please be aware that if you are taking birth control pills, taking other prescriptions, ESPECIALLY ANTIBIOTICS may make the birth control ineffective - if this is the case, either do not engage in sexual activity or use alternative methods of birth control such as condoms until you have finished the medicine and your family doctor says it is OK to restart them. If you are on a blood thinner such as COUMADIN, be aware that any other medicine that you take may cause the coumadin to either work too much, or not enough - you should have your coumadin level rechecked in next 7 days if this is the case.  ?  It is also a possibility that you have an allergic reaction to any of the medicines that you have been prescribed - Everybody reacts differently to medications and while MOST people have no trouble with most medicines, you may have a reaction such as nausea,  vomiting, rash, swelling, shortness of breath. If this is the case, please stop taking the medicine immediately and contact your physician.  ?  You should return to the ER if you develop severe or worsening symptoms.   Emergency Department Resource Guide 1) Find a Doctor and Pay Out of Pocket Although you won't have to find out who is covered by your insurance plan, it is a good idea to ask around and get recommendations. You will then need to call the office and see if the doctor you have chosen will accept you as a new patient and what types of options they offer for patients who are self-pay. Some doctors offer discounts or will set up payment plans for their patients who do not have insurance, but you will need to ask so you aren't surprised when you get to your appointment.  2) Contact Your Local Health Department Not all health departments have doctors that can see patients for sick visits, but many do, so it is worth a call to see if yours does. If you don't know where your local health department is, you can check in your phone book. The CDC also has a tool to help you locate your state's health department, and many state websites also have listings of all of their local health departments.  3) Find a [facility] If your illness is not likely to be very severe or complicated, you may want to try a walk in clinic. These  are popping up all over the country in pharmacies, drugstores, and shopping centers. They're usually staffed by nurse practitioners or physician assistants that have been trained to treat common illnesses and complaints. They're usually fairly quick and inexpensive. However, if you have serious medical issues or chronic medical problems, these are probably not your best option.  No Primary Care Doctor: Call Health Connect at  [phone] - they can help you locate a primary care doctor that  accepts your insurance, provides certain services, etc. Physician Referral [phone]  Emergency Department Resource Guide 1) Find a Doctor and Pay Out of Pocket Although you won't have to find out who is covered by your insurance plan, it is a good idea to ask around and get recommendations. You will then need to call the office and see if the doctor you have chosen will accept you as a new patient and what types of options they offer for patients who are self-pay. Some doctors offer discounts or will set up payment plans for their patients who do not have insurance, but you will need to ask so you aren't surprised when you get to your appointment.  2) Contact Your Local Health Department Not all health departments have doctors that can see patients for sick visits, but many do, so it is worth a call to see if yours does. If you don't know where your local health department is, you can check in your phone book. The CDC also has a tool to help you locate your state's health department, and many state websites also have listings of all of their local health departments.  3) Find a [facility] If your illness is not likely to be very severe or complicated, you may want to try a walk in clinic. These are popping up all over the country in pharmacies, drugstores, and shopping centers. They're usually staffed by nurse practitioners or physician assistants that have been trained to treat common illnesses and complaints. They're usually fairly quick and inexpensive. However, if you have serious medical issues or chronic medical problems, these are probably not your best option.  No Primary Care Doctor: Call Health Connect at  [phone] - they can help you locate a primary care doctor that  accepts your insurance, provides certain services, etc. Physician Referral Service- [phone]  Chronic Pain Problems: Organization         Address  Phone   Notes  [facility]  [phone] Patients need to be referred by their primary care doctor.    Medication Assistance: Organization         Address  Phone   Notes  [facility] Medication [facility] [address] [place], [place] [address] [phone] --Must be a resident of [facility] -- Must have NO insurance coverage whatsoever (no Medicaid/ Medicare, etc.) -- The pt. MUST have a primary care doctor that directs their care regularly and follows them in the community   [facility]  [phone]   [employer]  [phone]    Agencies that provide inexpensive medical care: Organization         Address  Phone   Notes  [place]  [phone]   [facility]    [phone]   [facility] [place],  [phone]   [place] [address] [place] [phone]   [facility]anned Parenthood    (  [phone]   [facility]    [phone]   [facility]  [address], [employer] [place] Phone:  [phone], Fax:  [phone] Hours of Operation:  9 am - 6 pm, M-F.  Also accepts Medicaid/Medicare and self-pay.  [facility] for [place], [address], [place] Phone: [phone], Fax: [phone]. Hours of Operation:  8:30 am - 5:30 pm, M-F.  Also accepts Medicaid and self-pay.  [facility] [place] [address], [place] Phone: [phone]   [place], [place], [place] [phone], Ext. [phone] Mondays & Thursdays: 7-9 AM.  First 15 patients are seen on a first come, first serve basis.    [place] Providers:  Organization         Address  Phone   Notes  [facility] [address], [place] [phone] Also accepts self-pay patients.  [facility] [address], [place]  [phone]   [place], [address], [place] [phone]   [facility] [address] [place] [phone]   Lucianne Lei [address], [place]   [phone] Only accepts [place] [facility] [place] patients after they have their name applied to their card.   Self-Pay (no insurance) in [facility]:  Organization         Address  Phone   Notes  Sickle Cell Patients, [facility] [place] [phone]   [facility] [place] [phone]   [facility] [place]  [place], [place], [place] [phone]   [facility]/Dr. Osei-Bonsu  [address], [place] or [address], [place] [phone] Phone number for both [place] and [place] locations is the same.  [facility] [address], [place] [phone]   [facility] [address] [place] or [address] [phone]   [facility] [address] [place] [phone], phone; [phone], fax Sees patients 1st and 3rd Saturday of every month.  Must not qualify for public or private insurance (i.e. Medicaid, Medicare, [place] Health Choice, Veterans' Benefits)  Household income should be no more than 200% of the poverty level The clinic cannot treat you if you are pregnant or think you are pregnant  Sexually transmitted diseases are not treated at the clinic.

## 2018-03-11 NOTE — ED Notes (Signed)
Patient transported to X-ray 

## 2018-03-11 NOTE — ED Notes (Signed)
ED Provider at bedside. 

## 2018-03-14 DIAGNOSIS — M5000 Cervical disc disorder with myelopathy, unspecified cervical region: Secondary | ICD-10-CM | POA: Diagnosis not present

## 2018-03-22 DIAGNOSIS — M5000 Cervical disc disorder with myelopathy, unspecified cervical region: Secondary | ICD-10-CM | POA: Diagnosis not present

## 2018-03-22 DIAGNOSIS — E7800 Pure hypercholesterolemia, unspecified: Secondary | ICD-10-CM | POA: Diagnosis not present

## 2018-03-22 DIAGNOSIS — I10 Essential (primary) hypertension: Secondary | ICD-10-CM | POA: Diagnosis not present

## 2019-02-11 ENCOUNTER — Ambulatory Visit (HOSPITAL_COMMUNITY): Payer: Medicare Other | Admitting: Anesthesiology

## 2019-02-11 ENCOUNTER — Observation Stay (HOSPITAL_COMMUNITY)
Admission: RE | Admit: 2019-02-11 | Discharge: 2019-02-12 | Disposition: A | Discharge status: Home / Self Care | Payer: Medicare Other | Source: Other Acute Inpatient Hospital | Attending: Neurosurgery | Admitting: Neurosurgery

## 2019-02-11 ENCOUNTER — Other Ambulatory Visit (HOSPITAL_COMMUNITY)
Admission: RE | Admit: 2019-02-11 | Discharge: 2019-02-11 | Disposition: A | Discharge status: Home / Self Care | Payer: Medicare Other | Source: Ambulatory Visit | Attending: Neurosurgery | Admitting: Neurosurgery

## 2019-02-11 ENCOUNTER — Encounter (HOSPITAL_COMMUNITY): Admission: RE | Disposition: A | Discharge status: Home / Self Care | Payer: Self-pay | Attending: Neurosurgery

## 2019-02-11 ENCOUNTER — Other Ambulatory Visit: Payer: Self-pay | Admitting: Neurosurgery

## 2019-02-11 DIAGNOSIS — Z1629 Resistance to other single specified antibiotic: Secondary | ICD-10-CM | POA: Diagnosis not present

## 2019-02-11 DIAGNOSIS — Z87891 Personal history of nicotine dependence: Secondary | ICD-10-CM | POA: Diagnosis not present

## 2019-02-11 DIAGNOSIS — F329 Major depressive disorder, single episode, unspecified: Secondary | ICD-10-CM | POA: Diagnosis not present

## 2019-02-11 DIAGNOSIS — Z888 Allergy status to other drugs, medicaments and biological substances status: Secondary | ICD-10-CM | POA: Diagnosis not present

## 2019-02-11 DIAGNOSIS — N400 Enlarged prostate without lower urinary tract symptoms: Secondary | ICD-10-CM | POA: Diagnosis not present

## 2019-02-11 DIAGNOSIS — M1990 Unspecified osteoarthritis, unspecified site: Secondary | ICD-10-CM | POA: Diagnosis not present

## 2019-02-11 DIAGNOSIS — Z1611 Resistance to penicillins: Secondary | ICD-10-CM | POA: Insufficient documentation

## 2019-02-11 DIAGNOSIS — Z20828 Contact with and (suspected) exposure to other viral communicable diseases: Secondary | ICD-10-CM | POA: Diagnosis not present

## 2019-02-11 DIAGNOSIS — E7800 Pure hypercholesterolemia, unspecified: Secondary | ICD-10-CM | POA: Insufficient documentation

## 2019-02-11 DIAGNOSIS — G5601 Carpal tunnel syndrome, right upper limb: Secondary | ICD-10-CM | POA: Diagnosis present

## 2019-02-11 DIAGNOSIS — T8149XA Infection following a procedure, other surgical site, initial encounter: Secondary | ICD-10-CM

## 2019-02-11 DIAGNOSIS — M109 Gout, unspecified: Secondary | ICD-10-CM | POA: Diagnosis not present

## 2019-02-11 DIAGNOSIS — T8141XA Infection following a procedure, superficial incisional surgical site, initial encounter: Principal | ICD-10-CM | POA: Insufficient documentation

## 2019-02-11 DIAGNOSIS — X58XXXA Exposure to other specified factors, initial encounter: Secondary | ICD-10-CM | POA: Insufficient documentation

## 2019-02-11 DIAGNOSIS — G4730 Sleep apnea, unspecified: Secondary | ICD-10-CM | POA: Diagnosis not present

## 2019-02-11 DIAGNOSIS — K219 Gastro-esophageal reflux disease without esophagitis: Secondary | ICD-10-CM | POA: Diagnosis not present

## 2019-02-11 DIAGNOSIS — Z79899 Other long term (current) drug therapy: Secondary | ICD-10-CM | POA: Diagnosis not present

## 2019-02-11 DIAGNOSIS — I10 Essential (primary) hypertension: Secondary | ICD-10-CM | POA: Insufficient documentation

## 2019-02-11 DIAGNOSIS — B9562 Methicillin resistant Staphylococcus aureus infection as the cause of diseases classified elsewhere: Secondary | ICD-10-CM | POA: Diagnosis not present

## 2019-02-11 DIAGNOSIS — Z981 Arthrodesis status: Secondary | ICD-10-CM | POA: Diagnosis not present

## 2019-02-11 DIAGNOSIS — Z8546 Personal history of malignant neoplasm of prostate: Secondary | ICD-10-CM | POA: Diagnosis not present

## 2019-02-11 DIAGNOSIS — Z9049 Acquired absence of other specified parts of digestive tract: Secondary | ICD-10-CM | POA: Diagnosis not present

## 2019-02-11 HISTORY — PX: CARPAL TUNNEL RELEASE: SHX101

## 2019-02-11 LAB — BASIC METABOLIC PANEL
Anion gap: 11 (ref 5–15)
BUN: 13 mg/dL (ref 8–23)
CO2: 22 mmol/L (ref 22–32)
Calcium: 9.2 mg/dL (ref 8.9–10.3)
Chloride: 102 mmol/L (ref 98–111)
Creatinine, Ser: 1.11 mg/dL (ref 0.61–1.24)
GFR calc Af Amer: >60 mL/min (ref >60)
GFR calc non Af Amer: >60 mL/min (ref >60)
Glucose, Bld: 103 mg/dL — ABNORMAL HIGH (ref 70–99)
Potassium: 4.2 mmol/L (ref 3.5–5.1)
Sodium: 135 mmol/L (ref 135–145)

## 2019-02-11 LAB — SARS CORONAVIRUS 2 BY RT PCR (HOSPITAL ORDER, PERFORMED IN ~~LOC~~ HOSPITAL LAB): SARS Coronavirus 2: NEGATIVE

## 2019-02-11 LAB — CBC
HCT: 30.4 % — ABNORMAL LOW (ref 39.0–52.0)
Hemoglobin: 9.2 g/dL — ABNORMAL LOW (ref 13.0–17.0)
MCH: 26.7 pg (ref 26.0–34.0)
MCHC: 30.3 g/dL (ref 30.0–36.0)
MCV: 88.1 fL (ref 80.0–100.0)
Platelets: 375 10*3/uL (ref 150–400)
RBC: 3.45 MIL/uL — ABNORMAL LOW (ref 4.22–5.81)
RDW: 17.8 % — ABNORMAL HIGH (ref 11.5–15.5)
WBC: 10.8 10*3/uL — ABNORMAL HIGH (ref 4.0–10.5)
nRBC: 0 % (ref 0.0–0.2)

## 2019-02-11 LAB — SURGICAL PCR SCREEN
MRSA, PCR: POSITIVE — AB
Staphylococcus aureus: POSITIVE — AB

## 2019-02-11 SURGERY — CARPAL TUNNEL RELEASE
Anesthesia: Regional | Site: Wrist | Laterality: Right

## 2019-02-11 MED ORDER — LIDOCAINE 2% (20 MG/ML) 5 ML SYRINGE
INTRAMUSCULAR | Status: AC
  Filled 2019-02-11: qty 5

## 2019-02-11 MED ORDER — PROPOFOL 500 MG/50ML IV EMUL
INTRAVENOUS | Status: DC | PRN
  Administered 2019-02-11: 50 ug/kg/min via INTRAVENOUS

## 2019-02-11 MED ORDER — ACETAMINOPHEN 500 MG PO TABS: 1000.0000 mg | ORAL_TABLET | Freq: Once | ORAL | Status: DC

## 2019-02-11 MED ORDER — KETAMINE HCL 10 MG/ML IJ SOLN
INTRAMUSCULAR | Status: DC | PRN
  Administered 2019-02-11: 10 mg via INTRAVENOUS
  Administered 2019-02-11: 15 mg via INTRAVENOUS
  Administered 2019-02-11: 25 mg via INTRAVENOUS

## 2019-02-11 MED ORDER — LIDOCAINE-EPINEPHRINE (PF) 1.5 %-1:200000 IJ SOLN
INTRAMUSCULAR | Status: DC | PRN
  Administered 2019-02-11: 30 mL via PERINEURAL

## 2019-02-11 MED ORDER — PROPOFOL 10 MG/ML IV BOLUS
INTRAVENOUS | Status: DC | PRN
  Administered 2019-02-11: 50 mg via INTRAVENOUS
  Administered 2019-02-11 (×2): 20 mg via INTRAVENOUS

## 2019-02-11 MED ORDER — CEFAZOLIN SODIUM-DEXTROSE 2-4 GM/100ML-% IV SOLN
INTRAVENOUS | Status: AC
  Filled 2019-02-11: qty 100

## 2019-02-11 MED ORDER — MIDAZOLAM HCL 2 MG/2ML IJ SOLN
INTRAMUSCULAR | Status: AC
  Filled 2019-02-11: qty 2

## 2019-02-11 MED ORDER — ONDANSETRON HCL 4 MG/2ML IJ SOLN: 4.0000 mg | Freq: Once | INTRAMUSCULAR | Status: DC | PRN

## 2019-02-11 MED ORDER — PHENYLEPHRINE HCL-NACL 10-0.9 MG/250ML-% IV SOLN
INTRAVENOUS | Status: DC | PRN
  Administered 2019-02-11: 30 ug/min via INTRAVENOUS

## 2019-02-11 MED ORDER — LACTATED RINGERS IV SOLN
INTRAVENOUS | Status: DC
  Administered 2019-02-11: 15:00:00 via INTRAVENOUS

## 2019-02-11 MED ORDER — PHENYLEPHRINE HCL (PRESSORS) 10 MG/ML IV SOLN
INTRAVENOUS | Status: DC | PRN
  Administered 2019-02-11: 80 ug via INTRAVENOUS

## 2019-02-11 MED ORDER — CHLORHEXIDINE GLUCONATE CLOTH 2 % EX PADS: 6.0000 | MEDICATED_PAD | Freq: Once | CUTANEOUS | Status: DC

## 2019-02-11 MED ORDER — KETAMINE HCL 50 MG/5ML IJ SOSY
PREFILLED_SYRINGE | INTRAMUSCULAR | Status: AC
  Filled 2019-02-11: qty 5

## 2019-02-11 MED ORDER — 0.9 % SODIUM CHLORIDE (POUR BTL) OPTIME
TOPICAL | Status: DC | PRN
  Administered 2019-02-11: 1000 mL

## 2019-02-11 MED ORDER — FENTANYL CITRATE (PF) 100 MCG/2ML IJ SOLN: 25.0000 ug | INTRAMUSCULAR | Status: DC | PRN

## 2019-02-11 MED ORDER — GLYCOPYRROLATE PF 0.2 MG/ML IJ SOSY
PREFILLED_SYRINGE | INTRAMUSCULAR | Status: AC
  Filled 2019-02-11: qty 1

## 2019-02-11 MED ORDER — LIDOCAINE-EPINEPHRINE 0.5 %-1:200000 IJ SOLN
INTRAMUSCULAR | Status: AC
  Filled 2019-02-11: qty 1

## 2019-02-11 MED ORDER — FENTANYL CITRATE (PF) 250 MCG/5ML IJ SOLN
INTRAMUSCULAR | Status: AC
  Filled 2019-02-11: qty 5

## 2019-02-11 MED ORDER — CEFAZOLIN SODIUM-DEXTROSE 2-4 GM/100ML-% IV SOLN
2.0000 g | Freq: Three times a day (TID) | INTRAVENOUS | Status: DC
  Administered 2019-02-12 (×2): 2 g via INTRAVENOUS
  Filled 2019-02-11 (×3): qty 100

## 2019-02-11 MED ORDER — BACITRACIN ZINC 500 UNIT/GM EX OINT
TOPICAL_OINTMENT | CUTANEOUS | Status: AC
  Filled 2019-02-11: qty 28.35

## 2019-02-11 MED ORDER — CEFAZOLIN SODIUM-DEXTROSE 2-4 GM/100ML-% IV SOLN: 2.0000 g | INTRAVENOUS | Status: DC

## 2019-02-11 MED ORDER — MUPIROCIN 2 % EX OINT
1.0000 "application " | TOPICAL_OINTMENT | Freq: Two times a day (BID) | CUTANEOUS | Status: DC
  Administered 2019-02-11 (×2): 1 via NASAL
  Filled 2019-02-11: qty 22

## 2019-02-11 MED ORDER — GLYCOPYRROLATE 0.2 MG/ML IJ SOLN
INTRAMUSCULAR | Status: DC | PRN
  Administered 2019-02-11: .1 mg via INTRAVENOUS

## 2019-02-11 MED ORDER — FENTANYL CITRATE (PF) 250 MCG/5ML IJ SOLN
INTRAMUSCULAR | Status: DC | PRN
  Administered 2019-02-11: 50 ug via INTRAVENOUS
  Administered 2019-02-11: 25 ug via INTRAVENOUS

## 2019-02-11 MED ORDER — PROPOFOL 10 MG/ML IV BOLUS
INTRAVENOUS | Status: AC
  Filled 2019-02-11: qty 40

## 2019-02-11 MED ORDER — CEFAZOLIN SODIUM 1 G IJ SOLR
INTRAMUSCULAR | Status: AC
  Filled 2019-02-11: qty 10

## 2019-02-11 MED ORDER — MIDAZOLAM HCL 5 MG/5ML IJ SOLN
INTRAMUSCULAR | Status: DC | PRN
  Administered 2019-02-11 (×2): 1 mg via INTRAVENOUS

## 2019-02-11 MED ORDER — PHENYLEPHRINE 40 MCG/ML (10ML) SYRINGE FOR IV PUSH (FOR BLOOD PRESSURE SUPPORT)
PREFILLED_SYRINGE | INTRAVENOUS | Status: AC
  Filled 2019-02-11: qty 10

## 2019-02-11 MED ORDER — MUPIROCIN 2 % EX OINT
TOPICAL_OINTMENT | CUTANEOUS | Status: AC
  Administered 2019-02-11: 1 via NASAL
  Filled 2019-02-11: qty 22

## 2019-02-11 MED ORDER — LIDOCAINE 2% (20 MG/ML) 5 ML SYRINGE
INTRAMUSCULAR | Status: DC | PRN
  Administered 2019-02-11: 20 mg via INTRAVENOUS

## 2019-02-11 MED ORDER — CEFAZOLIN SODIUM-DEXTROSE 2-3 GM-%(50ML) IV SOLR
INTRAVENOUS | Status: DC | PRN
  Administered 2019-02-11: 3 g via INTRAVENOUS

## 2019-02-11 MED ORDER — SODIUM CHLORIDE (PF) 0.9 % IJ SOLN
INTRAMUSCULAR | Status: AC
  Filled 2019-02-11: qty 10

## 2019-02-11 SURGICAL SUPPLY — 31 items
BLADE SURG 15 STRL LF DISP TIS (BLADE) ×1 IMPLANT
BLADE SURG 15 STRL SS (BLADE) ×1
BNDG ELASTIC 3X5.8 VLCR STR LF (GAUZE/BANDAGES/DRESSINGS) IMPLANT
BNDG GAUZE ELAST 4 BULKY (GAUZE/BANDAGES/DRESSINGS) IMPLANT
CABLE BIPOLOR RESECTION CORD (MISCELLANEOUS) ×2 IMPLANT
DRAPE EXTREMITY T 121X128X90 (DISPOSABLE) ×2 IMPLANT
DRAPE HALF SHEET 40X57 (DRAPES) ×2 IMPLANT
DRSG OPSITE POSTOP 3X4 (GAUZE/BANDAGES/DRESSINGS) ×2 IMPLANT
DURAPREP 26ML APPLICATOR (WOUND CARE) ×2 IMPLANT
GAUZE 4X4 16PLY RFD (DISPOSABLE) ×2 IMPLANT
GAUZE PACKING IODOFORM 1/4X15 (GAUZE/BANDAGES/DRESSINGS) ×2 IMPLANT
GLOVE ECLIPSE 6.5 STRL STRAW (GLOVE) ×2 IMPLANT
GOWN STRL REUS W/ TWL LRG LVL3 (GOWN DISPOSABLE) ×2 IMPLANT
GOWN STRL REUS W/ TWL XL LVL3 (GOWN DISPOSABLE) ×1 IMPLANT
GOWN STRL REUS W/TWL 2XL LVL3 (GOWN DISPOSABLE) IMPLANT
GOWN STRL REUS W/TWL LRG LVL3 (GOWN DISPOSABLE) ×2
GOWN STRL REUS W/TWL XL LVL3 (GOWN DISPOSABLE) ×1
KIT BASIN OR (CUSTOM PROCEDURE TRAY) ×2 IMPLANT
KIT TURNOVER KIT B (KITS) ×2 IMPLANT
NEEDLE HYPO 25X1 1.5 SAFETY (NEEDLE) ×2 IMPLANT
NS IRRIG 1000ML POUR BTL (IV SOLUTION) ×2 IMPLANT
PACK SURGICAL SETUP 50X90 (CUSTOM PROCEDURE TRAY) ×2 IMPLANT
STOCKINETTE 4X48 STRL (DRAPES) ×2 IMPLANT
SUT ETHILON 3 0 PS 1 (SUTURE) ×2 IMPLANT
SYR BULB 3OZ (MISCELLANEOUS) ×2 IMPLANT
SYR CONTROL 10ML LL (SYRINGE) ×2 IMPLANT
TOWEL GREEN STERILE (TOWEL DISPOSABLE) ×2 IMPLANT
TOWEL GREEN STERILE FF (TOWEL DISPOSABLE) ×2 IMPLANT
TUBE CONNECTING 12X1/4 (SUCTIONS) ×2 IMPLANT
UNDERPAD 30X30 (UNDERPADS AND DIAPERS) ×2 IMPLANT
WATER STERILE IRR 1000ML POUR (IV SOLUTION) ×2 IMPLANT

## 2019-02-11 NOTE — H&P (Signed)
Eric Cruz is an 68 y.o. male.   Chief Complaint: wound infection, status post carpal tunnel release HPI: Eric Cruz had an uncomplicated right carpal tunnel release. I removed the sutures last week. He came to the office today with a draining wound. I recommended he undergo operative debridement of the wound.   Past Medical History:  Diagnosis Date  . Arthritis   . BPH (benign prostatic hyperplasia)   . Cancer [facility])    Prostate: states he had a positive biopsy but had another one a year later and it was gone.   . Depression   . Dizziness   . GERD (gastroesophageal reflux disease)   . Gout   . High cholesterol   . Hypertension   . Sleep apnea    has cpap - not currently wearing  . Varicose veins     Past Surgical History:  Procedure Laterality Date  . ANTERIOR CERVICAL DECOMP/DISCECTOMY FUSION N/A 02/21/2018   Procedure: Cervical Five-Six Cervical Six-Seven Anterior cervical decompression/discectomy/fusion;  Surgeon: Ashok Pall, MD;  Location: [place];  Service: Neurosurgery;  Laterality: N/A;  Cervical Five-Six Cervical Six-Seven Anterior cervical decompression/discectomy/fusion  . BLADDER SURGERY    . CHOLECYSTECTOMY  11/22/2012  . CHOLECYSTECTOMY  11/22/2012   Procedure: LAPAROSCOPIC CHOLECYSTECTOMY;  Surgeon: Gwenyth Ober, MD;  Location: [place];  Service: General;;  . COLONOSCOPY      No family history on file. Social History:  reports that he quit smoking about 20 years ago. He has never used smokeless tobacco. He reports that he does not drink alcohol or use drugs.  Allergies:  Allergies  Allergen Reactions  . Other     Can only tolerate oxygen on a low level  . Statins     Arms ache    Medications Prior to Admission  Medication Sig Dispense Refill  . finasteride (PROSCAR) 5 MG tablet Take 5 mg by mouth daily.    . furosemide (LASIX) 40 MG tablet Take 40 mg by mouth daily.    [place] Kitchen oxyCODONE (OXY IR/ROXICODONE) 5 MG immediate release tablet Take 1 tablet (5  mg total) by mouth every 3 (three) hours as needed for moderate pain ((score 4 to 6)). 30 tablet 0  . tamsulosin (FLOMAX) 0.4 MG CAPS capsule Take 0.4 mg by mouth daily after supper.    . Vitamins A & D (VITAMIN A & D) ointment Apply 1 application topically daily as needed for dry skin.      Results for orders placed or performed during the hospital encounter of 02/11/19 (from the past 48 hour(s))  Basic metabolic panel     Status: Abnormal   Collection Time: 02/11/19  2:29 PM  Result Value Ref Range   Sodium 135 135 - 145 mmol/L   Potassium 4.2 3.5 - 5.1 mmol/L   Chloride 102 98 - 111 mmol/L   CO2 22 22 - 32 mmol/L   Glucose, Bld 103 (H) 70 - 99 mg/dL   BUN 13 8 - 23 mg/dL   Creatinine, Ser 1.11 0.61 - 1.24 mg/dL   Calcium 9.2 8.9 - 10.3 mg/dL   GFR calc non Af Amer >60 >60 mL/min   GFR calc Af Amer >60 >60 mL/min   Anion gap 11 5 - 15    Comment: Performed at [facility] [address] [place], [place] [address]  CBC     Status: Abnormal   Collection Time: 02/11/19  2:29 PM  Result Value Ref Range   WBC 10.8 (  H) 4.0 - 10.5 K/uL   RBC 3.45 (L) 4.22 - 5.81 MIL/uL   Hemoglobin 9.2 (L) 13.0 - 17.0 g/dL   HCT 30.4 (L) 39.0 - 52.0 %   MCV 88.1 80.0 - 100.0 fL   MCH 26.7 26.0 - 34.0 pg   MCHC 30.3 30.0 - 36.0 g/dL   RDW 17.8 (H) 11.5 - 15.5 %   Platelets 375 150 - 400 K/uL   nRBC 0.0 0.0 - 0.2 %    Comment: Performed at [facility] [address] [place], [place] [address]  Surgical PCR screen     Status: Abnormal   Collection Time: 02/11/19  2:29 PM   Specimen: Nasal Mucosa; Nasal Swab  Result Value Ref Range   MRSA, PCR POSITIVE (A) NEGATIVE   Staphylococcus aureus POSITIVE (A) NEGATIVE    Comment: (NOTE) The Xpert SA Assay (FDA approved for NASAL specimens in patients 83 years of age and older), is one component of a comprehensive surveillance program. It is not intended to diagnose infection nor to guide or monitor treatment. Performed at [facility], [place] [address] [place], [place] [address]   SARS Coronavirus 2 by RT PCR (hospital order, performed in [facility] hospital lab) Nasopharyngeal Nasopharyngeal Swab     Status: None   Collection Time: 02/11/19  2:35 PM   Specimen: Nasopharyngeal Swab  Result Value Ref Range   SARS Coronavirus 2 NEGATIVE NEGATIVE    Comment: (NOTE) If result is NEGATIVE SARS-CoV-2 target nucleic acids are NOT DETECTED. The SARS-CoV-2 RNA is generally detectable in upper and lower  respiratory specimens during the acute phase of infection. The lowest  concentration of SARS-CoV-2 viral copies this assay can detect is 250  copies / mL. A negative result does not preclude SARS-CoV-2 infection  and should not be used as the sole basis for treatment or other  patient management decisions.  A negative result may occur with  improper specimen collection / handling, submission of specimen other  than nasopharyngeal swab, presence of viral mutation(s) within the  areas targeted by this assay, and inadequate number of viral copies  (<250 copies / mL). A negative result must be combined with clinical  observations, patient history, and epidemiological information. If result is POSITIVE SARS-CoV-2 target nucleic acids are DETECTED. The SARS-CoV-2 RNA is generally detectable in upper and lower  respiratory specimens dur ing the acute phase of infection.  Positive  results are indicative of active infection with SARS-CoV-2.  Clinical  correlation with patient history and other diagnostic information is  necessary to determine patient infection status.  Positive results do  not rule out bacterial infection or co-infection with other viruses. If result is PRESUMPTIVE POSTIVE SARS-CoV-2 nucleic acids MAY BE PRESENT.   A presumptive positive result was obtained on the submitted specimen  and confirmed on repeat testing.  While 2019 novel coronavirus  (SARS-CoV-2) nucleic acids may be present in the submitted  sample  additional confirmatory testing may be necessary for epidemiological  and / or clinical management purposes  to differentiate between  SARS-CoV-2 and other Sarbecovirus currently known to infect humans.  If clinically indicated additional testing with an alternate test  methodology [phone]) is advised. The SARS-CoV-2 RNA is generally  detectable in upper and lower respiratory sp ecimens during the acute  phase of infection. The expected result is Negative. Fact Sheet for Patients:  StrictlyIdeas.no Fact Sheet for Healthcare Providers: BankingDealers.co.za This test is not yet approved or cleared by  the [employer] and has been authorized for detection and/or diagnosis of SARS-CoV-2 by FDA under an Emergency Use Authorization (EUA).  This EUA will remain in effect (meaning this test can be used) for the duration of the COVID-19 declaration under Section 564(b)(1) of the Act, 21 U.S.C. section 360bbb-3(b)(1), unless the authorization is terminated or revoked sooner. Performed at [facility], [place] [address] [place], [place] [address]    No results found.  Review of Systems  Constitutional: Negative.   HENT: Negative.   Eyes: Negative.   Respiratory: Negative.   Cardiovascular: Negative.   Genitourinary: Negative.   Musculoskeletal: Negative.   Skin:       Open wound right carpal tunnel release  Neurological: Negative.   Endo/Heme/Allergies: Negative.   Psychiatric/Behavioral: Negative.     Blood pressure 131/83, pulse 93, temperature 98.9 F (37.2 C), temperature source Oral, resp. rate 20, height 5\' 8"  (1.727 m), weight (!) 136.7 kg, SpO2 96 %. Physical Exam  Constitutional: He is oriented to person, place, and time. He appears well-developed and well-nourished.  HENT:  Head: Normocephalic and atraumatic.  Eyes: Pupils are equal, round, and reactive to light. Conjunctivae and EOM are normal.   Neck: Normal range of motion. Neck supple.  Cardiovascular: Normal rate, regular rhythm and normal heart sounds.  Respiratory: Effort normal and breath sounds normal.  GI: Soft. Bowel sounds are normal.  Neurological: He is alert and oriented to person, place, and time. He has normal reflexes. He displays normal reflexes. No cranial nerve deficit. He exhibits normal muscle tone. Coordination normal.  Skin:  Open wound  Psychiatric: He has a normal mood and affect. His behavior is normal. Judgment and thought content normal.     Assessment/Plan Operative debridement right carpal tunnel wound. Risks and benefits explained including continued infection, nerve damage, weakness in hand, numbness in hand, no improvement. He understands and wishes to proceed  Ashok Pall, MD 02/11/2019, 5:26 PM

## 2019-02-11 NOTE — Anesthesia Procedure Notes (Signed)
Anesthesia Regional Block: Supraclavicular block   Pre-Anesthetic Checklist: ,, timeout performed, Correct Patient, Correct Site, Correct Laterality, Correct Procedure, Correct Position, site marked, Risks and benefits discussed,  Surgical consent,  Pre-op evaluation,  At surgeon's request and post-op pain management  Laterality: Right  Prep: chloraprep       Needles:  Injection technique: Single-shot  Needle Type: Echogenic Stimulator Needle     Needle Length: 10cm  Needle Gauge: 21     Additional Needles:   Procedures:,,,, ultrasound used (permanent image in chart),,,,  Narrative:  Start time: 02/11/2019 5:20 PM End time: 02/11/2019 5:30 PM Injection made incrementally with aspirations every 5 mL.  Performed by: Personally  Anesthesiologist: Murvin Natal, MD  Additional Notes: Functioning IV was confirmed and monitors were applied.  A timeout was performed. Sterile prep, hand hygiene and sterile gloves were used. A 138mm 21ga [place] echogenic stimulator needle was used. Negative aspiration and negative test dose prior to incremental administration of local anesthetic. The patient tolerated the procedure well.  Ultrasound guidance: relevent anatomy identified, needle position confirmed, local anesthetic spread visualized around nerve(s), vascular puncture avoided.  Image printed for medical record.

## 2019-02-11 NOTE — Anesthesia Preprocedure Evaluation (Addendum)
Anesthesia Evaluation  Patient identified by MRN, date of birth, ID band Patient awake    Reviewed: Allergy & Precautions, NPO status , Patient's Chart, lab work & pertinent test results  Airway Mallampati: III  TM Distance: >3 FB Neck ROM: Full    Dental  (+) Missing   Pulmonary sleep apnea , former smoker,    Pulmonary exam normal breath sounds clear to auscultation       Cardiovascular hypertension, Pt. on medications Normal cardiovascular exam Rhythm:Regular Rate:Normal  ECG: SR, rate 77  ECHO: Left ventricle: The cavity size was normal. Systolic function was normal. The estimated ejection fraction was in the range of 60% to 65%. Wall motion was normal; there were no regional wall motion abnormalities. Left ventricular diastolic function parameters were normal. Aortic valve: Trileaflet; normal thickness leaflets. There was no regurgitation. Mitral valve: There was trivial regurgitation. Left atrium: The atrium was moderately dilated. Right ventricle: The cavity size was normal. Wall thickness was normal. Systolic function was normal. Right atrium: The atrium was normal in size. Tricuspid valve: There was trivial regurgitation. Pulmonic valve: There was no regurgitation. Pulmonary arteries: Systolic pressure was mildly increased. Inferior vena cava: The vessel was normal in size. Pericardium, extracardiac: There was no pericardial effusion.   Neuro/Psych PSYCHIATRIC DISORDERS Depression negative neurological ROS     GI/Hepatic Neg liver ROS, GERD  Medicated and Controlled,  Endo/Other  Morbid obesity  Renal/GU negative Renal ROS     Musculoskeletal Gout   Abdominal (+) + obese,   Peds  Hematology  (+) anemia ,   Anesthesia Other Findings Carpal tunnel wound infection  Reproductive/Obstetrics                            Anesthesia Physical Anesthesia Plan  ASA: III  Anesthesia Plan:  Regional   Post-op Pain Management:    Induction: Intravenous  PONV Risk Score and Plan: 1 and Ondansetron, Dexamethasone, Midazolam and Treatment may vary due to age or medical condition  Airway Management Planned: Simple Face Mask  Additional Equipment:   Intra-op Plan:   Post-operative Plan:   Informed Consent: I have reviewed the patients History and Physical, chart, labs and discussed the procedure including the risks, benefits and alternatives for the proposed anesthesia with the patient or authorized representative who has indicated his/her understanding and acceptance.     Dental advisory given  Plan Discussed with: CRNA  Anesthesia Plan Comments:         Anesthesia Quick Evaluation

## 2019-02-11 NOTE — Anesthesia Postprocedure Evaluation (Signed)
Anesthesia Post Note  Patient: Eric Cruz  Procedure(s) Performed: Right Carpal Tunnel Wound Irrigation (Right Wrist)     Patient location during evaluation: PACU Anesthesia Type: Regional Level of consciousness: awake and alert Pain management: pain level controlled Vital Signs Assessment: post-procedure vital signs reviewed and stable Respiratory status: spontaneous breathing, nonlabored ventilation, respiratory function stable and patient connected to nasal cannula oxygen Cardiovascular status: stable and blood pressure returned to baseline Postop Assessment: no apparent nausea or vomiting Anesthetic complications: no    Last Vitals:  Vitals:   02/11/19 1834 02/11/19 1849  BP: 126/71 126/73  Pulse: 80 80  Resp: 19 19  Temp:    SpO2: 100% 99%    Last Pain:  Vitals:   02/11/19 1849  TempSrc:   PainSc: 0-No pain                 Coralynn Gaona DANIEL

## 2019-02-11 NOTE — Op Note (Signed)
   6:39 PM  PATIENT:  Eric Cruz  68 y.o. male  PRE-OPERATIVE DIAGNOSIS:  right Carpal Tunnel Syndrome, wound infection  POST-OPERATIVE DIAGNOSIS:  right Carpal Tunnel Syndrome, wound infection  PROCEDURE:  Procedure(s): Right Carpal Tunnel Wound Irrigation  SURGEON: Surgeon(s): Ashok Pall, MD  ANESTHESIA:   regional and IV sedation  EBL:  Total I/O In: 700 [I.V.:700] Out: 1 [Blood:1]  COUNT:correct  DICTATION: Eric Cruz was taken to the operating room, given IV sedation, and positioned on the operating room table. male had their right upper extremity prepped and draped in a sterile manner. I opened the skin with a hemostat and expressed a small amount of purulent material through the suture holes. I probed the incision and there was no collection of fluid, nor abscess.  I used the bipolar cautery to control the subcutaneous bleeding.  I irrigated the wound. I packed the wound with packing tape, and covered it with an occlusive bandage.  PLAN OF CARE: observation to the floor  PATIENT DISPOSITION:  PACU - hemodynamically stable.   Delay start of Pharmacological VTE agent (>24hrs) due to surgical blood loss or risk of bleeding:  yes

## 2019-02-11 NOTE — Transfer of Care (Signed)
Immediate Anesthesia Transfer of Care Note  Patient: Eric Cruz  Procedure(s) Performed: Right Carpal tunnel wound exploration/wash out (Right )  Patient Location: PACU  Anesthesia Type:MAC combined with regional for post-op pain  Level of Consciousness: awake, alert , oriented and patient cooperative  Airway & Oxygen Therapy: Patient Spontanous Breathing and Patient connected to face mask oxygen  Post-op Assessment: Report given to RN and Post -op Vital signs reviewed and stable  Post vital signs: Reviewed and stable  Last Vitals:  Vitals Value Taken Time  BP 116/64 02/11/19 1819  Temp    Pulse 79 02/11/19 1820  Resp 17 02/11/19 1820  SpO2 100 % 02/11/19 1820  Vitals shown include unvalidated device data.  Last Pain:  Vitals:   02/11/19 1503  TempSrc:   PainSc: 0-No pain      Patients Stated Pain Goal: 3 (86/16/83 7290)  Complications: No apparent anesthesia complications

## 2019-02-12 ENCOUNTER — Encounter (HOSPITAL_COMMUNITY): Payer: Self-pay | Admitting: Neurosurgery

## 2019-02-12 DIAGNOSIS — T8141XA Infection following a procedure, superficial incisional surgical site, initial encounter: Secondary | ICD-10-CM | POA: Diagnosis not present

## 2019-02-12 LAB — HIV ANTIBODY (ROUTINE TESTING W REFLEX): HIV Screen 4th Generation wRfx: NONREACTIVE

## 2019-02-12 MED ORDER — POTASSIUM CHLORIDE IN NACL 20-0.9 MEQ/L-% IV SOLN
INTRAVENOUS | Status: DC
  Administered 2019-02-12: 01:00:00 via INTRAVENOUS
  Filled 2019-02-12 (×2): qty 1000

## 2019-02-12 MED ORDER — HYDROCODONE-ACETAMINOPHEN 7.5-325 MG PO TABS: 1.0000 | ORAL_TABLET | ORAL | Status: DC | PRN

## 2019-02-12 MED ORDER — HYDROCODONE-ACETAMINOPHEN 5-325 MG PO TABS: 1.0000 | ORAL_TABLET | ORAL | Status: DC | PRN

## 2019-02-12 MED ORDER — FUROSEMIDE 40 MG PO TABS
40.0000 mg | ORAL_TABLET | Freq: Every day | ORAL | Status: DC
  Administered 2019-02-12: 40 mg via ORAL
  Filled 2019-02-12: qty 1

## 2019-02-12 MED ORDER — ONDANSETRON HCL 4 MG PO TABS: 4.0000 mg | ORAL_TABLET | Freq: Four times a day (QID) | ORAL | Status: DC | PRN

## 2019-02-12 MED ORDER — TRAMADOL HCL 50 MG PO TABS
50.0000 mg | ORAL_TABLET | Freq: Four times a day (QID) | ORAL | Status: DC
  Administered 2019-02-12 (×3): 50 mg via ORAL
  Filled 2019-02-12 (×3): qty 1

## 2019-02-12 MED ORDER — OXYCODONE HCL 5 MG PO TABS: 5.0000 mg | ORAL_TABLET | ORAL | Status: DC | PRN

## 2019-02-12 MED ORDER — VITAMINS A & D EX OINT
1.0000 "application " | TOPICAL_OINTMENT | Freq: Every day | CUTANEOUS | Status: DC | PRN
  Filled 2019-02-12: qty 56.7

## 2019-02-12 MED ORDER — KETOROLAC TROMETHAMINE 15 MG/ML IJ SOLN
7.5000 mg | Freq: Four times a day (QID) | INTRAMUSCULAR | Status: DC
  Administered 2019-02-12 (×3): 7.5 mg via INTRAVENOUS
  Filled 2019-02-12 (×3): qty 1

## 2019-02-12 MED ORDER — ACETAMINOPHEN 500 MG PO TABS
500.0000 mg | ORAL_TABLET | Freq: Four times a day (QID) | ORAL | Status: DC
  Administered 2019-02-12 (×3): 500 mg via ORAL
  Filled 2019-02-12 (×3): qty 1

## 2019-02-12 MED ORDER — CIPROFLOXACIN HCL 500 MG PO TABS: 500.0000 mg | ORAL_TABLET | Freq: Two times a day (BID) | ORAL | 0 refills | Status: AC

## 2019-02-12 MED ORDER — ZOLPIDEM TARTRATE 5 MG PO TABS: 5.0000 mg | ORAL_TABLET | Freq: Every evening | ORAL | Status: DC | PRN

## 2019-02-12 MED ORDER — ACETAMINOPHEN 325 MG PO TABS: 325.0000 mg | ORAL_TABLET | Freq: Four times a day (QID) | ORAL | Status: DC | PRN

## 2019-02-12 MED ORDER — ONDANSETRON HCL 4 MG/2ML IJ SOLN: 4.0000 mg | Freq: Four times a day (QID) | INTRAMUSCULAR | Status: DC | PRN

## 2019-02-12 MED ORDER — FINASTERIDE 5 MG PO TABS
5.0000 mg | ORAL_TABLET | Freq: Every day | ORAL | Status: DC
  Administered 2019-02-12: 5 mg via ORAL
  Filled 2019-02-12: qty 1

## 2019-02-12 MED ORDER — MORPHINE SULFATE (PF) 2 MG/ML IV SOLN: 0.5000 mg | INTRAVENOUS | Status: DC | PRN

## 2019-02-12 MED ORDER — TAMSULOSIN HCL 0.4 MG PO CAPS: 0.4000 mg | ORAL_CAPSULE | Freq: Every day | ORAL | Status: DC

## 2019-02-12 MED ORDER — TRAMADOL HCL 50 MG PO TABS: 50.0000 mg | ORAL_TABLET | Freq: Four times a day (QID) | ORAL | 0 refills | Status: DC

## 2019-02-12 MED ORDER — GABAPENTIN 300 MG PO CAPS
300.0000 mg | ORAL_CAPSULE | Freq: Three times a day (TID) | ORAL | Status: DC
  Administered 2019-02-12: 300 mg via ORAL
  Filled 2019-02-12: qty 1

## 2019-02-12 MED ORDER — HEPARIN SODIUM (PORCINE) 5000 UNIT/ML IJ SOLN
5000.0000 [IU] | Freq: Three times a day (TID) | INTRAMUSCULAR | Status: DC
  Filled 2019-02-12: qty 1

## 2019-02-12 NOTE — Plan of Care (Signed)
  Problem: Education: Goal: Required Educational Video(s) Outcome: Adequate for Discharge   Problem: Clinical Measurements: Goal: Postoperative complications will be avoided or minimized Outcome: Adequate for Discharge   Problem: Skin Integrity: Goal: Demonstration of wound healing without infection will improve Outcome: Adequate for Discharge   

## 2019-02-12 NOTE — Discharge Summary (Signed)
Physician Discharge Summary  Patient ID: Eric Cruz MRN: [id number] DOB/AGE: [birth year] 68 y.o.  Admit date: 02/11/2019 Discharge date: 02/12/2019  Admission Diagnoses: Wound infection after surgery  Discharge Diagnoses:  Active Problems:   Wound infection after surgery   Discharged Condition: good  Hospital Course: Patient was admitted on 02/11/2019 following a right carpal tunnel wound irrigation by Dr. Christella Noa. He has done well since surgery. He is ready to discharge home.  Consults: None  Significant Diagnostic Studies: labs:   Results for orders placed or performed during the hospital encounter of 02/11/19 (from the past 24 hour(s))  Basic metabolic panel     Status: Abnormal   Collection Time: 02/11/19  2:29 PM  Result Value Ref Range   Sodium 135 135 - 145 mmol/L   Potassium 4.2 3.5 - 5.1 mmol/L   Chloride 102 98 - 111 mmol/L   CO2 22 22 - 32 mmol/L   Glucose, Bld 103 (H) 70 - 99 mg/dL   BUN 13 8 - 23 mg/dL   Creatinine, Ser 1.11 0.61 - 1.24 mg/dL   Calcium 9.2 8.9 - 10.3 mg/dL   GFR calc non Af Amer >60 >60 mL/min   GFR calc Af Amer >60 >60 mL/min   Anion gap 11 5 - 15  CBC     Status: Abnormal   Collection Time: 02/11/19  2:29 PM  Result Value Ref Range   WBC 10.8 (H) 4.0 - 10.5 K/uL   RBC 3.45 (L) 4.22 - 5.81 MIL/uL   Hemoglobin 9.2 (L) 13.0 - 17.0 g/dL   HCT 30.4 (L) 39.0 - 52.0 %   MCV 88.1 80.0 - 100.0 fL   MCH 26.7 26.0 - 34.0 pg   MCHC 30.3 30.0 - 36.0 g/dL   RDW 17.8 (H) 11.5 - 15.5 %   Platelets 375 150 - 400 K/uL   nRBC 0.0 0.0 - 0.2 %  Surgical PCR screen     Status: Abnormal   Collection Time: 02/11/19  2:29 PM   Specimen: Nasal Mucosa; Nasal Swab  Result Value Ref Range   MRSA, PCR POSITIVE (A) NEGATIVE   Staphylococcus aureus POSITIVE (A) NEGATIVE  SARS Coronavirus 2 by RT PCR (hospital order, performed in [place] hospital lab) Nasopharyngeal Nasopharyngeal Swab     Status: None   Collection Time: 02/11/19  2:35 PM   Specimen:  Nasopharyngeal Swab  Result Value Ref Range   SARS Coronavirus 2 NEGATIVE NEGATIVE  Aerobic/Anaerobic Culture (surgical/deep wound)     Status: None (Preliminary result)   Collection Time: 02/11/19  6:23 PM   Specimen: Wound  Result Value Ref Range   Specimen Description WOUND    Special Requests CARPAL TUNNEL    Gram Stain      RARE WBC PRESENT, PREDOMINANTLY PMN FEW GRAM POSITIVE COCCI Performed at [facility], [place] [address] [place], [place] [address]    Culture PENDING    Report Status PENDING   HIV Antibody (routine testing w rflx)     Status: None   Collection Time: 02/12/19  3:15 AM  Result Value Ref Range   HIV Screen 4th Generation wRfx NON REACTIVE NON REACTIVE     Treatments: surgery: right carpal tunnel wound irrigation  Discharge Exam: Blood pressure 97/60, pulse 68, temperature 97.8 F (36.6 C), temperature source Oral, resp. rate 16, height 5\' 8"  (1.727 m), weight (!) 138.8 kg, SpO2 98 %.   Alert and oriented x 4 PERRLA CN II-XII grossly intact MAE, Strength and  sensation intact Incision is covered with Honeycomb dressing; Dressing is with sanguinous drainage   Disposition: Discharge disposition: 01-Home or Self Care       Discharge Instructions    Face-to-face encounter (required for Medicare/Medicaid patients)   Complete by: As directed    I Patricia Nettle certify that this patient is under my care and that I, or a nurse practitioner or physician's assistant working with me, had a face-to-face encounter that meets the physician face-to-face encounter requirements with this patient on 02/12/2019. The encounter with the patient was in whole, or in part for the following medical condition(s) which is the primary reason for home health care (List medical condition): Right carpal tunnel release wound infection   The encounter with the patient was in whole, or in part, for the following medical condition, which is the primary reason for home health  care: Right carpal tunnel release wound infection   I certify that, based on my findings, the following services are medically necessary home health services: Nursing   Reason for Medically Necessary Home Health Services: Skilled Nursing- Post-Surgical Wound Assessment and Care   My clinical findings support the need for the above services: Unable to leave home safely without assistance and/or assistive device   Further, I certify that my clinical findings support that this patient is homebound due to: Unable to leave home safely without assistance   Home Health   Complete by: As directed    To provide the following care/treatments: RN     Allergies as of 02/12/2019      Reactions   Other    Can only tolerate oxygen on a low level   Statins    Arms ache      Medication List    TAKE these medications   ciprofloxacin 500 MG tablet Commonly known as: Cipro Take 1 tablet (500 mg total) by mouth 2 (two) times daily for 10 days.   finasteride 5 MG tablet Commonly known as: PROSCAR Take 5 mg by mouth daily.   furosemide 40 MG tablet Commonly known as: LASIX Take 40 mg by mouth daily.   oxyCODONE 5 MG immediate release tablet Commonly known as: Oxy IR/ROXICODONE Take 1 tablet (5 mg total) by mouth every 3 (three) hours as needed for moderate pain ((score 4 to 6)).   tamsulosin 0.4 MG Caps capsule Commonly known as: FLOMAX Take 0.4 mg by mouth daily after supper.   traMADol 50 MG tablet Commonly known as: ULTRAM Take 1 tablet (50 mg total) by mouth every 6 (six) hours.   vitamin A & D ointment Apply 1 application topically daily as needed for dry skin.      Follow-up Information    Ashok Pall, MD. Schedule an appointment as soon as possible for a visit in 10 day(s).   Specialty: Neurosurgery Contact information: [address] [place] [address] [phone]           Signed: Patricia Nettle 02/12/2019, 10:13 AM

## 2019-02-12 NOTE — Discharge Instructions (Signed)
Home health nurse should change dressing daily with 1/4 inch packing tape and an occlusive bandage.   *No hydrogen peroxide*

## 2019-02-12 NOTE — Progress Notes (Signed)
Wound on the Right hand packed with moistened saline strip with occlusive petroluem dressing applied after that,  followed by dry gauze with tape.  Wife is at the bedside while dressing changed,  RN demonstrated with a written copy of steps on daily dressing change given to paitent's wife.  Home health nurse will be following on dressing change after discharge.

## 2019-02-12 NOTE — TOC Transition Note (Addendum)
Transition of Care [facility]) - CM/SW Discharge Note   Patient Details  Name: Eric Cruz MRN: [id number] Date of Birth: Nov 14, 1950  Transition of Care [facility]) CM/SW Contact:  Pollie Friar, RN Phone Number: 02/12/2019, 10:44 AM   Clinical Narrative:    Pt discharging home with [facility] services through [facility] for wound care. CM has reached out to NP for frequency of dressing changes. Bedside RN instructing pt and wife on dressing changes.  Pt has hospital f/u.  Pt has transportation home.    Final next level of care: Home/Self Care Barriers to Discharge: No Barriers Identified   Patient Goals and CMS Choice        Discharge Placement                       Discharge Plan and Services                                     Social Determinants of Health (SDOH) Interventions     Readmission Risk Interventions No flowsheet data found.

## 2019-02-12 NOTE — Plan of Care (Signed)
  Problem: Education: Goal: Required Educational Video(s) 02/12/2019 1623 by Myriam Forehand, RN Outcome: Completed/Met 02/12/2019 1623 by Myriam Forehand, RN Outcome: Adequate for Discharge   Problem: Clinical Measurements: Goal: Postoperative complications will be avoided or minimized 02/12/2019 1623 by Myriam Forehand, RN Outcome: Completed/Met 02/12/2019 1623 by Myriam Forehand, RN Outcome: Adequate for Discharge   Problem: Skin Integrity: Goal: Demonstration of wound healing without infection will improve 02/12/2019 1623 by Myriam Forehand, RN Outcome: Completed/Met 02/12/2019 1623 by Myriam Forehand, RN Outcome: Adequate for Discharge

## 2019-02-16 LAB — AEROBIC/ANAEROBIC CULTURE W GRAM STAIN (SURGICAL/DEEP WOUND)

## 2019-05-14 ENCOUNTER — Ambulatory Visit: Payer: Self-pay

## 2019-05-14 ENCOUNTER — Other Ambulatory Visit: Payer: Self-pay

## 2019-05-14 ENCOUNTER — Encounter: Payer: Self-pay | Admitting: Orthopaedic Surgery

## 2019-05-14 ENCOUNTER — Ambulatory Visit: Payer: Medicare Other | Admitting: Orthopaedic Surgery

## 2019-05-14 DIAGNOSIS — M25511 Pain in right shoulder: Secondary | ICD-10-CM

## 2019-05-14 DIAGNOSIS — G8929 Other chronic pain: Secondary | ICD-10-CM | POA: Diagnosis not present

## 2019-05-14 DIAGNOSIS — M25512 Pain in left shoulder: Secondary | ICD-10-CM

## 2019-05-14 DIAGNOSIS — M170 Bilateral primary osteoarthritis of knee: Secondary | ICD-10-CM | POA: Diagnosis not present

## 2019-05-14 MED ORDER — METHYLPREDNISOLONE ACETATE 40 MG/ML IJ SUSP
13.3300 mg | INTRAMUSCULAR | Status: AC | PRN
  Administered 2019-05-14: 13.33 mg via INTRA_ARTICULAR

## 2019-05-14 MED ORDER — BUPIVACAINE HCL 0.25 % IJ SOLN
0.6600 mL | INTRAMUSCULAR | Status: AC | PRN
  Administered 2019-05-14: .66 mL via INTRA_ARTICULAR

## 2019-05-14 MED ORDER — LIDOCAINE HCL 1 % IJ SOLN
3.0000 mL | INTRAMUSCULAR | Status: AC | PRN
  Administered 2019-05-14: 3 mL

## 2019-05-14 NOTE — Progress Notes (Signed)
Office Visit Note   Patient: Eric Cruz           Date of Birth: 1950-09-01           MRN: [id number] Visit Date: 05/14/2019              Requested by: Seward Carol, MD [address] [place],   [address] PCP: Seward Carol, MD   Assessment & Plan: Visit Diagnoses:  1. Bilateral primary osteoarthritis of knee   2. Chronic pain of both shoulders     Plan: Impression is bilateral knee osteoarthritis and bilateral shoulder subacromial bursitis as well as total body deconditioning. We proceeded with bilateral knee cortisone injections today. We will follow up with [place] in 1 to 2 weeks for bilateral shoulder subacromial cortisone injections. In the meantime, I have sent in an internal referral for physical therapy. I discussed the entire visit with his wife Stanton Kidney over the phone. Call with concerns or questions in the meantime.   Follow-Up Instructions: Return in about 1 week (around 05/21/2019).   Orders:  Orders Placed This Encounter  Procedures  . Large Joint Inj: bilateral knee  . XR KNEE 3 VIEW LEFT  . XR KNEE 3 VIEW RIGHT  . XR Shoulder Right  . XR Shoulder Left  . Ambulatory referral to Physical Therapy   No orders of the defined types were placed in this encounter.     Procedures: Large Joint Inj: bilateral knee on 05/14/2019 2:31 PM Indications: pain Details: 22 G needle, anterolateral approach Medications (Right): 0.66 mL bupivacaine 0.25 %; 3 mL lidocaine 1 %; 13.33 mg methylPREDNISolone acetate 40 MG/ML Medications (Left): 0.66 mL bupivacaine 0.25 %; 3 mL lidocaine 1 %; 13.33 mg methylPREDNISolone acetate 40 MG/ML      Clinical Data: No additional findings.   Subjective: Chief Complaint  Patient presents with  . Right Knee - Pain  . Left Knee - Pain  . Right Shoulder - Pain  . Left Shoulder - Pain    HPI patient is a pleasant 69 year old gentleman who comes in today for evaluation of bilateral knee and bilateral shoulder  pain. In regards to his knees the left is worse than the right. He has had pain for the past 3 to 4 weeks without any known injury or change in activity. He notes that his legs have been getting weaker and has been ambulating in a wheelchair at times. The pain he has is to the medial and lateral joint lines. He describes this as a constant tooth ache worse with ambulation as well as flexion of the knee. He denies any mechanical symptoms. He has been taking Tylenol with mild relief of symptoms. He does have numbness and tingling to both feet. No previous cortisone injection or surgical intervention to the knees. In regards to his shoulders, his pain is worse on the right. Pain has been ongoing for the past 4 days without any known injury or change in activity. The pain is to the entire shoulder worse when sleeping on either side as well as with any movement of the shoulder. He does note numbness and tingling and burning to all 10 fingers. He is status post bilateral carpal tunnel and cubital tunnel release as well as cervical spine surgery by Dr. Herold Harms. No previous injection or surgical intervention to either shoulder.  Review of Systems as detailed in HPI. All others reviewed and are negative.   Objective: Vital Signs: There were no vitals taken for this  visit.  Physical Exam well-developed well-nourished gentleman in no acute distress. Alert oriented x3.  Ortho Exam examination of both knees shows range of motion of 0 to 90 degrees. Lateral greater than medial joint line tenderness on the left. Medial greater than lateral joint line tenderness on the right. Mild patellofemoral crepitus. Ligaments are stable. He is neurovascular intact distally. Left shoulder exam shows partially 75% active range of motion all planes. Positive empty can. Marked tenderness over the [facility] joint. Right shoulder approximately 50% active range of motion. Positive empty can. No tenderness to the [facility] joint. He has approximately 3  out of 5 strength throughout. His entire body does seem to be fairly deconditioned.  Specialty Comments:  No specialty comments available.  Imaging: XR KNEE 3 VIEW LEFT  Result Date: 05/14/2019 Moderate tricompartmental degenerative changes  XR KNEE 3 VIEW RIGHT  Result Date: 05/14/2019 Moderate tricompartmental degenerative changes  XR Shoulder Left  Result Date: 05/14/2019 Moderate degenerative changes the [facility] joint  XR Shoulder Right  Result Date: 05/14/2019 Moderate degenerative changes the [facility] joint    PMFS History: Patient Active Problem List   Diagnosis Date Noted  . Wound infection after surgery 02/11/2019  . HNP (herniated nucleus pulposus) with myelopathy, cervical 02/21/2018  . Postop check 12/10/2012  . Symptomatic cholelithiasis 11/15/2012   Past Medical History:  Diagnosis Date  . Arthritis   . BPH (benign prostatic hyperplasia)   . Cancer [facility])    Prostate: states he had a positive biopsy but had another one a year later and it was gone.   . Depression   . Dizziness   . GERD (gastroesophageal reflux disease)   . Gout   . High cholesterol   . Hypertension   . Sleep apnea    has cpap - not currently wearing  . Varicose veins     History reviewed. No pertinent family history.  Past Surgical History:  Procedure Laterality Date  . ANTERIOR CERVICAL DECOMP/DISCECTOMY FUSION N/A 02/21/2018   Procedure: Cervical Five-Six Cervical Six-Seven Anterior cervical decompression/discectomy/fusion;  Surgeon: Ashok Pall, MD;  Location: [place];  Service: Neurosurgery;  Laterality: N/A;  Cervical Five-Six Cervical Six-Seven Anterior cervical decompression/discectomy/fusion  . BLADDER SURGERY    . CARPAL TUNNEL RELEASE Right 02/11/2019   Procedure: Right Carpal Tunnel Wound Irrigation;  Surgeon: Ashok Pall, MD;  Location: [place];  Service: Neurosurgery;  Laterality: Right;  Right Carpal tunnel wound exploration/wash out  . CHOLECYSTECTOMY  11/22/2012  .  CHOLECYSTECTOMY  11/22/2012   Procedure: LAPAROSCOPIC CHOLECYSTECTOMY;  Surgeon: Gwenyth Ober, MD;  Location: [place];  Service: General;;  . COLONOSCOPY     Social History   Occupational History  . Not on file  Tobacco Use  . Smoking status: Former Smoker    Quit date: 04/22/1998    Years since quitting: 21.0  . Smokeless tobacco: Never Used  Substance and Sexual Activity  . Alcohol use: No  . Drug use: No  . Sexual activity: Not on file

## 2019-05-28 ENCOUNTER — Encounter: Payer: Self-pay | Admitting: Orthopaedic Surgery

## 2019-05-28 ENCOUNTER — Telehealth: Payer: Self-pay

## 2019-05-28 ENCOUNTER — Other Ambulatory Visit: Payer: Self-pay

## 2019-05-28 ENCOUNTER — Ambulatory Visit: Payer: Medicare Other | Admitting: Orthopaedic Surgery

## 2019-05-28 VITALS — Ht 68.0 in | Wt 306.0 lb

## 2019-05-28 DIAGNOSIS — M25512 Pain in left shoulder: Secondary | ICD-10-CM | POA: Diagnosis not present

## 2019-05-28 DIAGNOSIS — G8929 Other chronic pain: Secondary | ICD-10-CM | POA: Diagnosis not present

## 2019-05-28 DIAGNOSIS — M25511 Pain in right shoulder: Secondary | ICD-10-CM | POA: Diagnosis not present

## 2019-05-28 MED ORDER — METHYLPREDNISOLONE ACETATE 40 MG/ML IJ SUSP
13.3300 mg | INTRAMUSCULAR | Status: AC | PRN
  Administered 2019-05-28: 13.33 mg via INTRA_ARTICULAR

## 2019-05-28 MED ORDER — TRAMADOL HCL 50 MG PO TABS: 50.0000 mg | ORAL_TABLET | Freq: Three times a day (TID) | ORAL | 1 refills | Status: DC | PRN

## 2019-05-28 MED ORDER — BUPIVACAINE HCL 0.25 % IJ SOLN
0.6600 mL | INTRAMUSCULAR | Status: AC | PRN
  Administered 2019-05-28: 16:00:00 .66 mL via INTRA_ARTICULAR

## 2019-05-28 MED ORDER — LIDOCAINE HCL 1 % IJ SOLN
0.5000 mL | INTRAMUSCULAR | Status: AC | PRN
  Administered 2019-05-28: .5 mL

## 2019-05-28 MED ORDER — BUPIVACAINE HCL 0.25 % IJ SOLN
0.6600 mL | INTRAMUSCULAR | Status: AC | PRN
  Administered 2019-05-28: .66 mL via INTRA_ARTICULAR

## 2019-05-28 NOTE — Progress Notes (Signed)
Office Visit Note   Patient: Eric Cruz           Date of Birth: January 12, 1951           MRN: [id number] Visit Date: 05/28/2019              Requested by: Seward Carol, MD [address] [place],  [place] [address] PCP: Seward Carol, MD   Assessment & Plan: Visit Diagnoses:  1. Chronic pain of both shoulders     Plan: Impression is bilateral shoulder subacromial bursitis.  Both shoulders were injected with cortisone today.  He will follow-up with [place] as needed for his shoulders.  Follow-Up Instructions: Return if symptoms worsen or fail to improve.   Orders:  Orders Placed This Encounter  Procedures  . Large Joint Inj: bilateral subacromial bursa   Meds ordered this encounter  Medications  . traMADol (ULTRAM) 50 MG tablet    Sig: Take 1 tablet (50 mg total) by mouth 3 (three) times daily as needed.    Dispense:  30 tablet    Refill:  1      Procedures: Large Joint Inj: bilateral subacromial bursa on 05/28/2019 3:53 PM Indications: pain Details: 22 G needle Medications (Right): 0.5 mL lidocaine 1 %; 0.66 mL bupivacaine 0.25 %; 13.33 mg methylPREDNISolone acetate 40 MG/ML Medications (Left): 0.5 mL lidocaine 1 %; 0.66 mL bupivacaine 0.25 %; 13.33 mg methylPREDNISolone acetate 40 MG/ML Outcome: tolerated well, no immediate complications Patient was prepped and draped in the usual sterile fashion.       Clinical Data: No additional findings.   Subjective: Chief Complaint  Patient presents with  . Right Shoulder - Pain  . Left Shoulder - Pain    HPI patient is a pleasant 69 year old gentleman who comes in today for bilateral shoulder cortisone injection.  He was seen in our office a couple weeks ago for this as well as for bilateral knee osteoarthritis.  At that visit, we injected both knees with cortisone so he wanted to wait a couple weeks before injecting both shoulders.  His shoulders with continued to bother him.     Objective: Vital  Signs: Ht 5\' 8"  (1.727 m)   Wt (!) 306 lb (138.8 kg)   BMI 46.53 kg/m    Ortho Exam stable exam of the shoulders.  Specialty Comments:  No specialty comments available.  Imaging: No new imaging   PMFS History: Patient Active Problem List   Diagnosis Date Noted  . Wound infection after surgery 02/11/2019  . HNP (herniated nucleus pulposus) with myelopathy, cervical 02/21/2018  . Postop check 12/10/2012  . Symptomatic cholelithiasis 11/15/2012   Past Medical History:  Diagnosis Date  . Arthritis   . BPH (benign prostatic hyperplasia)   . Cancer [facility])    Prostate: states he had a positive biopsy but had another one a year later and it was gone.   . Depression   . Dizziness   . GERD (gastroesophageal reflux disease)   . Gout   . High cholesterol   . Hypertension   . Sleep apnea    has cpap - not currently wearing  . Varicose veins     History reviewed. No pertinent family history.  Past Surgical History:  Procedure Laterality Date  . ANTERIOR CERVICAL DECOMP/DISCECTOMY FUSION N/A 02/21/2018   Procedure: Cervical Five-Six Cervical Six-Seven Anterior cervical decompression/discectomy/fusion;  Surgeon: Ashok Pall, MD;  Location: [place];  Service: Neurosurgery;  Laterality: N/A;  Cervical Five-Six Cervical Six-Seven  Anterior cervical decompression/discectomy/fusion  . BLADDER SURGERY    . CARPAL TUNNEL RELEASE Right 02/11/2019   Procedure: Right Carpal Tunnel Wound Irrigation;  Surgeon: Ashok Pall, MD;  Location: [place];  Service: Neurosurgery;  Laterality: Right;  Right Carpal tunnel wound exploration/wash out  . CHOLECYSTECTOMY  11/22/2012  . CHOLECYSTECTOMY  11/22/2012   Procedure: LAPAROSCOPIC CHOLECYSTECTOMY;  Surgeon: Gwenyth Ober, MD;  Location: [place];  Service: General;;  . COLONOSCOPY     Social History   Occupational History  . Not on file  Tobacco Use  . Smoking status: Former Smoker    Quit date: 04/22/1998    Years since quitting: 21.1  . Smokeless  tobacco: Never Used  Substance and Sexual Activity  . Alcohol use: No  . Drug use: No  . Sexual activity: Not on file

## 2019-05-28 NOTE — Telephone Encounter (Signed)
Please get precert for left knee gel injections for patient. This is Dr.Xu's patient. Thanks!

## 2019-05-29 ENCOUNTER — Telehealth: Payer: Self-pay

## 2019-05-29 NOTE — Telephone Encounter (Signed)
Submitted for VOB for Synvisc one- Left knee

## 2019-05-29 NOTE — Telephone Encounter (Signed)
Submitted for VOB for durolane-Left knee

## 2019-05-30 ENCOUNTER — Telehealth: Payer: Self-pay

## 2019-05-30 NOTE — Telephone Encounter (Signed)
Called and scheduled pt for Monday Pt was informed of copay and stated understanding

## 2019-05-30 NOTE — Telephone Encounter (Signed)
Approved for Durolane-Left knee Dr. Frederik Pear and Bill $35 copay [id number] OOP No precert required

## 2019-06-02 ENCOUNTER — Other Ambulatory Visit: Payer: Self-pay

## 2019-06-02 ENCOUNTER — Encounter: Payer: Self-pay | Admitting: Orthopaedic Surgery

## 2019-06-02 ENCOUNTER — Ambulatory Visit: Payer: Medicare Other | Admitting: Orthopaedic Surgery

## 2019-06-02 DIAGNOSIS — M1712 Unilateral primary osteoarthritis, left knee: Secondary | ICD-10-CM | POA: Diagnosis not present

## 2019-06-02 MED ORDER — SODIUM HYALURONATE 60 MG/3ML IX PRSY
60.0000 mg | PREFILLED_SYRINGE | INTRA_ARTICULAR | Status: AC | PRN
  Administered 2019-06-02: 60 mg via INTRA_ARTICULAR

## 2019-06-02 MED ORDER — BUPIVACAINE HCL 0.25 % IJ SOLN
2.0000 mL | INTRAMUSCULAR | Status: AC | PRN
  Administered 2019-06-02: 2 mL via INTRA_ARTICULAR

## 2019-06-02 MED ORDER — LIDOCAINE HCL 1 % IJ SOLN
2.0000 mL | INTRAMUSCULAR | Status: AC | PRN
  Administered 2019-06-02: 2 mL

## 2019-06-02 NOTE — Progress Notes (Signed)
   Procedure Note  Patient: Eric Cruz             Date of Birth: [birth year]           MRN: [id number]             Visit Date: 06/02/2019  Procedures: Visit Diagnoses:  1. Unilateral primary osteoarthritis, left knee     Large Joint Inj: L knee on 06/02/2019 9:47 AM Indications: pain Details: 22 G needle, anterolateral approach Medications: 2 mL lidocaine 1 %; 2 mL bupivacaine 0.25 %; 60 mg Sodium Hyaluronate 60 MG/3ML

## 2019-06-09 ENCOUNTER — Encounter: Payer: Self-pay | Admitting: Physical Therapy

## 2019-06-09 ENCOUNTER — Ambulatory Visit (INDEPENDENT_AMBULATORY_CARE_PROVIDER_SITE_OTHER): Payer: Medicare Other | Admitting: Physical Therapy

## 2019-06-09 DIAGNOSIS — M25512 Pain in left shoulder: Secondary | ICD-10-CM

## 2019-06-09 DIAGNOSIS — R293 Abnormal posture: Secondary | ICD-10-CM

## 2019-06-09 DIAGNOSIS — M25562 Pain in left knee: Secondary | ICD-10-CM

## 2019-06-09 DIAGNOSIS — M6281 Muscle weakness (generalized): Secondary | ICD-10-CM | POA: Diagnosis not present

## 2019-06-09 DIAGNOSIS — M25511 Pain in right shoulder: Secondary | ICD-10-CM

## 2019-06-09 DIAGNOSIS — G8929 Other chronic pain: Secondary | ICD-10-CM

## 2019-06-09 DIAGNOSIS — R2681 Unsteadiness on feet: Secondary | ICD-10-CM

## 2019-06-09 NOTE — Therapy (Signed)
[facility] Physical Therapy [address] [place], [place], [address] Phone: [phone]   Fax:  [phone]  Physical Therapy Evaluation  Patient Details  Name: Eric Cruz MRN: [id number] Date of Birth: 1950/11/12 Referring Provider (PT): Aundra Dubin, [place]   Encounter Date: 06/09/2019  PT End of Session - 06/09/19 1556    Visit Number  1    Number of Visits  16    Date for PT Re-Evaluation  08/04/19    PT Start Time  1500   pt arrived late   PT Stop Time  1550    PT Time Calculation (min)  50 min    Equipment Utilized During Treatment  Gait belt    Activity Tolerance  Patient tolerated treatment well    Behavior During Therapy  [facility] for tasks assessed/performed       Past Medical History:  Diagnosis Date  . Arthritis   . BPH (benign prostatic hyperplasia)   . Cancer [facility])    Prostate: states he had a positive biopsy but had another one a year later and it was gone.   . Depression   . Dizziness   . GERD (gastroesophageal reflux disease)   . Gout   . High cholesterol   . Hypertension   . Sleep apnea    has cpap - not currently wearing  . Varicose veins     Past Surgical History:  Procedure Laterality Date  . ANTERIOR CERVICAL DECOMP/DISCECTOMY FUSION N/A 02/21/2018   Procedure: Cervical Five-Six Cervical Six-Seven Anterior cervical decompression/discectomy/fusion;  Surgeon: Ashok Pall, MD;  Location: [place];  Service: Neurosurgery;  Laterality: N/A;  Cervical Five-Six Cervical Six-Seven Anterior cervical decompression/discectomy/fusion  . BLADDER SURGERY    . CARPAL TUNNEL RELEASE Right 02/11/2019   Procedure: Right Carpal Tunnel Wound Irrigation;  Surgeon: Ashok Pall, MD;  Location: [place];  Service: Neurosurgery;  Laterality: Right;  Right Carpal tunnel wound exploration/wash out  . CHOLECYSTECTOMY  11/22/2012  . CHOLECYSTECTOMY  11/22/2012   Procedure: LAPAROSCOPIC CHOLECYSTECTOMY;  Surgeon: Gwenyth Ober, MD;  Location: [place];   Service: General;;  . COLONOSCOPY      There were no vitals filed for this visit.   Subjective Assessment - 06/09/19 1505    Subjective  Pt is a 69 y/o male who presents to OPPT for bil knee pain and bil shoulder pain.  Pt has had bil knee injections, bil shoulder injections and a Lt knee gel injection within the past 5-6 weeks.  Pt also reports 6 surgeries within the past 13 months.  Prior to surgeries pt was ambulatory.    Patient is accompained by:  Family member    Limitations  Standing;Walking    How long can you walk comfortably?  few steps - with axillary crutches    Patient Stated Goals  improve pain in Lt knee, improve mobility    Currently in Pain?  Yes    Pain Score  7    at best 2/10; up to 12/10   Pain Location  Knee    Pain Orientation  Left    Pain Descriptors / Indicators  Stabbing;Sharp   "like an ice pick"   Pain Type  Chronic pain    Pain Onset  More than a month ago    Pain Frequency  Constant    Aggravating Factors   standing, walking    Pain Relieving Factors  rest, medication         [facility] PT Assessment - 06/09/19 1511  Assessment   Medical Diagnosis  Bilateral knee and shoulder pain as well as total body deconditioning    Referring Provider (PT)  Aundra Dubin, PA-C    Onset Date/Surgical Date  --   ~ 13 months ago   Hand Dominance  Right    Next MD Visit  PRN    Prior Therapy  none      Precautions   Precautions  Fall      Restrictions   Weight Bearing Restrictions  No      Balance Screen   Has the patient fallen in the past 6 months  No    Has the patient had a decrease in activity level because of a fear of falling?   No    Is the patient reluctant to leave their home because of a fear of falling?   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    Leisure  watch TV; no regular exercise  - working on Equities trader   Overall Cognitive Status  Within Functional Limits for tasks assessed      Posture/Postural Control   Posture/Postural Control  Postural limitations    Postural Limitations  Rounded Shoulders;Forward head;Flexed trunk      ROM / Strength   AROM / PROM / Strength  Strength;AROM      AROM   Overall AROM Comments  grossly WFL      Strength   Overall Strength Comments  decreased LE and core strength; tested in sitting RLE 3+/5; LLE 3/5      Transfers   Transfers  Sit to Stand;Stand to Sit    Sit to Stand  3: Mod assist    Sit to Stand Details  Manual facilitation for weight shifting    Stand to Sit  4: Min assist;With upper extremity assist      Ambulation/Gait   Ambulation/Gait  Yes    Ambulation/Gait Assistance  4: Min assist    Ambulation/Gait Assistance Details  cues for posture and sequencing    Ambulation Distance (Feet)  50 Feet    Assistive device  R Forearm Crutch;L Forearm Crutch    Gait Pattern  Decreased stance time - left;Decreased step length - right;Step-to pattern      High Level Balance   High Level Balance Comments  min A to stand unsupported                Objective measurements completed on examination: See above findings.              PT Education - 06/09/19 1556    Education Details  different types of AD, ramp services    Person(s) Educated  Patient;Spouse    Methods  Explanation;Demonstration    Comprehension  Verbalized understanding;Returned demonstration       PT Short Term Goals - 06/09/19 1603      PT SHORT TERM GOAL #1  Title  BERG and gait velocity to be performed with LTG to be written    Status  New    Target Date   06/23/19      PT SHORT TERM GOAL #2   Title  amb > 150' with forearm crutches without increase in pain for improved function    Status  New    Target Date  07/07/19      PT SHORT TERM GOAL #3   Title  report Lt knee pain < 4/10    Status  New    Target Date  07/07/19        PT Long Term Goals - 06/09/19 1604      PT LONG TERM GOAL #1   Title  independent with HEP    Status  New    Target Date  08/04/19      PT LONG TERM GOAL #2   Title  amb > 350' with LRAD without increase in pain for improved function    Status  New    Target Date  08/04/19      PT LONG TERM GOAL #3   Title  negotiate 2-3 stairs without handrail and LRAD with supervision for improved function    Status  New    Target Date  08/04/19             Plan - 06/09/19 1557    Clinical Impression Statement  Pt is a 69 y/o male who presents to OPPT for bil knee pain and shoulder pain as well as general deconditioning.  Pt reports 6 surgeries over past 13 months, most significant ACDF and appears to have some Lt sided residual weakness.  Overall pt's pain is minimal except in Lt knee.  Pt with functional decline due to recent medical issues and demonstrates decreased strength, balance and gait abnormalities affecting functional mobility.  Pt will benefit from PT to address deficits listed.    Personal Factors and Comorbidities  Comorbidity 3+;Transportation    Comorbidities  arthritis, prostate cancer, depression, HTN, ACDF, Rt CTR    Examination-Activity Limitations  Sit;Bed Mobility;Squat;Stairs;Stand;Transfers;Locomotion Level;Lift;Reach Overhead    Examination-Participation Restrictions  Other   hobbies   Stability/Clinical Decision Making  Evolving/Moderate complexity    Clinical Decision Making  Moderate    Rehab Potential  Good    PT Frequency  2x / week    PT Duration  8 weeks    PT Treatment/Interventions  ADLs/Self Care Home Management;Cryotherapy;Electrical Stimulation;Moist Heat;Iontophoresis  4mg /ml Dexamethasone;Balance training;Therapeutic exercise;Therapeutic activities;Functional mobility training;Stair training;Gait training;DME Instruction;Ultrasound;Neuromuscular re-education;Patient/family education;Manual techniques;Vasopneumatic Device;Taping;Dry needling    PT Next Visit Plan  establish HEP for LE/UE strengthening (seated/standing if pt can tolerate), needs a BERG and gait velocity, gait training with forearm crutches    Consulted and Agree with Plan of Care  Patient       Patient will benefit from skilled therapeutic intervention in order to improve the following deficits and impairments:  Abnormal gait, Decreased endurance, Obesity, Decreased knowledge of use of DME, Decreased activity tolerance, Decreased strength, Pain, Impaired UE functional use, Difficulty walking, Decreased mobility, Decreased balance, Postural dysfunction, Impaired flexibility  Visit Diagnosis: Chronic left shoulder pain - Plan: PT plan of care cert/re-cert  Chronic right shoulder pain - Plan: PT plan of care cert/re-cert  Abnormal posture - Plan: PT plan of care cert/re-cert  Muscle weakness (generalized) - Plan: PT plan of care cert/re-cert  Unsteadiness on feet - Plan: PT plan of care cert/re-cert  Chronic pain of left knee -  Plan: PT plan of care cert/re-cert     Problem List Patient Active Problem List   Diagnosis Date Noted  . Wound infection after surgery 02/11/2019  . HNP (herniated nucleus pulposus) with myelopathy, cervical 02/21/2018  . Postop check 12/10/2012  . Symptomatic cholelithiasis 11/15/2012      Laureen Abrahams, PT, DPT 06/09/19 4:07 PM    [facility] Physical Therapy [address] [place], [place], [address] Phone: [phone]   Fax:  [phone]  Name: Eric Cruz MRN: [id number] Date of Birth: [birth year]

## 2019-06-10 ENCOUNTER — Ambulatory Visit: Payer: Medicare Other | Admitting: Physical Therapy

## 2019-06-13 ENCOUNTER — Encounter: Payer: Medicare Other | Admitting: Physical Therapy

## 2019-06-18 ENCOUNTER — Ambulatory Visit: Payer: Medicare Other | Admitting: Physical Therapy

## 2019-06-18 ENCOUNTER — Encounter: Payer: Self-pay | Admitting: Rehabilitative and Restorative Service Providers"

## 2019-06-18 ENCOUNTER — Other Ambulatory Visit: Payer: Self-pay

## 2019-06-18 DIAGNOSIS — G8929 Other chronic pain: Secondary | ICD-10-CM

## 2019-06-18 DIAGNOSIS — M6281 Muscle weakness (generalized): Secondary | ICD-10-CM

## 2019-06-18 DIAGNOSIS — M25512 Pain in left shoulder: Secondary | ICD-10-CM | POA: Diagnosis not present

## 2019-06-18 DIAGNOSIS — R2681 Unsteadiness on feet: Secondary | ICD-10-CM

## 2019-06-18 DIAGNOSIS — M25511 Pain in right shoulder: Secondary | ICD-10-CM | POA: Diagnosis not present

## 2019-06-18 DIAGNOSIS — R293 Abnormal posture: Secondary | ICD-10-CM

## 2019-06-18 DIAGNOSIS — M25562 Pain in left knee: Secondary | ICD-10-CM

## 2019-06-18 NOTE — Therapy (Signed)
[facility] Physical Therapy [address] [place], [place], [address] Phone: [phone]   Fax:  [phone]  Physical Therapy Treatment  Patient Details  Name: Eric Cruz MRN: [id number] Date of Birth: [birth year] Referring Provider (PT): Aundra Dubin, [place]   Encounter Date: 06/18/2019  PT End of Session - 06/18/19 1524    Visit Number  2    Number of Visits  16    Date for PT Re-Evaluation  08/04/19    PT Start Time  [id number]    PT Stop Time  1526    PT Time Calculation (min)  41 min    Equipment Utilized During Treatment  Gait belt    Activity Tolerance  Patient tolerated treatment well    Behavior During Therapy  [facility] for tasks assessed/performed       Past Medical History:  Diagnosis Date  . Arthritis   . BPH (benign prostatic hyperplasia)   . Cancer [facility])    Prostate: states he had a positive biopsy but had another one a year later and it was gone.   . Depression   . Dizziness   . GERD (gastroesophageal reflux disease)   . Gout   . High cholesterol   . Hypertension   . Sleep apnea    has cpap - not currently wearing  . Varicose veins     Past Surgical History:  Procedure Laterality Date  . ANTERIOR CERVICAL DECOMP/DISCECTOMY FUSION N/A 02/21/2018   Procedure: Cervical Five-Six Cervical Six-Seven Anterior cervical decompression/discectomy/fusion;  Surgeon: Ashok Pall, MD;  Location: [place];  Service: Neurosurgery;  Laterality: N/A;  Cervical Five-Six Cervical Six-Seven Anterior cervical decompression/discectomy/fusion  . BLADDER SURGERY    . CARPAL TUNNEL RELEASE Right 02/11/2019   Procedure: Right Carpal Tunnel Wound Irrigation;  Surgeon: Ashok Pall, MD;  Location: [place];  Service: Neurosurgery;  Laterality: Right;  Right Carpal tunnel wound exploration/wash out  . CHOLECYSTECTOMY  11/22/2012  . CHOLECYSTECTOMY  11/22/2012   Procedure: LAPAROSCOPIC CHOLECYSTECTOMY;  Surgeon: Gwenyth Ober, MD;  Location: [place];  Service: General;;   . COLONOSCOPY      There were no vitals filed for this visit.  Subjective Assessment - 06/18/19 1454    Subjective  Pt arriving to therapy reporting fatigue after walking to the clinic using a straight cane. Pt reporting that he has a set of loft strand crutches.    Limitations  Standing;Walking    How long can you walk comfortably?  few steps - with axillary crutches    Patient Stated Goals  improve pain in Lt knee, improve mobility    Currently in Pain?  Yes    Pain Score  8     Pain Location  Knee    Pain Orientation  Left    Pain Descriptors / Indicators  Aching         [facility] PT Assessment - 06/18/19 0001      Standardized Balance Assessment   Standardized Balance Assessment  Berg Balance Test      Berg Balance Test   Sit to Stand  Able to stand using hands after several tries    Standing Unsupported  Able to stand safely 2 minutes    Sitting with Back Unsupported but Feet Supported on Floor or Stool  Able to sit safely and securely 2 minutes    Stand to Sit  Controls descent by using hands    Transfers  Able to transfer safely, definite need of hands    Standing  Unsupported with Eyes Closed  Able to stand 10 seconds with supervision    Standing Unsupported with Feet Together  Able to place feet together independently and stand for 1 minute with supervision    From Standing, Reach Forward with Outstretched Arm  Can reach forward >5 cm safely (2")    From Standing Position, Pick up Object from Floor  Unable to try/needs assist to keep balance    From Standing Position, Turn to Look Behind Over each Shoulder  Turn sideways only but maintains balance    Turn 360 Degrees  Able to turn 360 degrees safely but slowly    Standing Unsupported, Alternately Place Feet on Step/Stool  Able to complete >2 steps/needs minimal assist    Standing Unsupported, One Foot in [place] to take small step independently and hold 30 seconds    Standing on One Leg  Unable to try or needs assist  to prevent fall    Total Score  31                   [facility] Adult PT Treatment/Exercise - 06/18/19 0001      Exercises   Exercises  Knee/Hip      Knee/Hip Exercises: Stretches   Active Hamstring Stretch  Both;2 reps;30 seconds      Knee/Hip Exercises: Aerobic   Nustep  L3 x 6 minutes      Knee/Hip Exercises: Seated   Long Arc Quad  Strengthening;Both;10 reps    Ball Squeeze  x 15 holding 5 seconds    Marching  AROM;Strengthening;Both;15 reps    Sit to [employer]  5 reps;with UE support   sitting on [employer] to add height for easier lift off              PT Short Term Goals - 06/09/19 1603      PT SHORT TERM GOAL #1   Title  BERG and gait velocity to be performed with LTG to be written    Status  New    Target Date  06/23/19      PT SHORT TERM GOAL #2   Title  amb > 150' with forearm crutches without increase in pain for improved function    Status  New    Target Date  07/07/19      PT SHORT TERM GOAL #3   Title  report Lt knee pain < 4/10    Status  New    Target Date  07/07/19        PT Long Term Goals - 06/18/19 1528      PT LONG TERM GOAL #1   Title  independent with HEP    Status  On-going      PT LONG TERM GOAL #2   Title  amb > 350' with LRAD without increase in pain for improved function    Status  On-going      PT LONG TERM GOAL #3   Title  negotiate 2-3 stairs without handrail and LRAD with supervision for improved function    Status  On-going      PT LONG TERM GOAL #4   Title  Pt will improve his BERG balance score from 31/56 to >/= 39/56.    Status  New            Plan - 06/18/19 1525    Clinical Impression Statement  Pt presenting today with pain in L knee of 8/10. BERG balance assessment performed pt with score of  31/56. Discussion of using an assistive device for safety. Pt tolerating sitting exericss well and reports he really likes the Nustep. Continue to progress toward goals set. New BERG balance goal added.     Personal Factors and Comorbidities  Comorbidity 3+;Transportation    Comorbidities  arthritis, prostate cancer, depression, HTN, ACDF, Rt CTR    Examination-Activity Limitations  Sit;Bed Mobility;Squat;Stairs;Stand;Transfers;Locomotion Level;Lift;Reach Overhead    Examination-Participation Restrictions  Other    Stability/Clinical Decision Making  Evolving/Moderate complexity    Rehab Potential  Good    PT Frequency  2x / week    PT Duration  8 weeks    PT Treatment/Interventions  ADLs/Self Care Home Management;Cryotherapy;Electrical Stimulation;Moist Heat;Iontophoresis 4mg /ml Dexamethasone;Balance training;Therapeutic exercise;Therapeutic activities;Functional mobility training;Stair training;Gait training;DME Instruction;Ultrasound;Neuromuscular re-education;Patient/family education;Manual techniques;Vasopneumatic Device;Taping;Dry needling    PT Next Visit Plan  establish HEP for LE/UE strengthening (seated/standing if pt can tolerate), needs a BERG and gait velocity, gait training with forearm crutches    Consulted and Agree with Plan of Care  Patient       Patient will benefit from skilled therapeutic intervention in order to improve the following deficits and impairments:  Abnormal gait, Decreased endurance, Obesity, Decreased knowledge of use of DME, Decreased activity tolerance, Decreased strength, Pain, Impaired UE functional use, Difficulty walking, Decreased mobility, Decreased balance, Postural dysfunction, Impaired flexibility  Visit Diagnosis: Chronic left shoulder pain  Chronic right shoulder pain  Abnormal posture  Muscle weakness (generalized)  Unsteadiness on feet  Chronic pain of left knee     Problem List Patient Active Problem List   Diagnosis Date Noted  . Wound infection after surgery 02/11/2019  . HNP (herniated nucleus pulposus) with myelopathy, cervical 02/21/2018  . Postop check 12/10/2012  . Symptomatic cholelithiasis 11/15/2012    Oretha Caprice, PT 06/18/2019, 3:31 PM  [facility] Physical Therapy [address] [place], [place], [address] Phone: [phone]   Fax:  [phone]  Name: JAWAAN LANFEAR MRN: [id number] Date of Birth: May 18, 1950

## 2019-06-23 ENCOUNTER — Ambulatory Visit: Payer: Medicare Other | Admitting: Physical Therapy

## 2019-06-23 ENCOUNTER — Encounter: Payer: Self-pay | Admitting: Physical Therapy

## 2019-06-23 ENCOUNTER — Other Ambulatory Visit: Payer: Self-pay

## 2019-06-23 DIAGNOSIS — M25562 Pain in left knee: Secondary | ICD-10-CM

## 2019-06-23 DIAGNOSIS — R2681 Unsteadiness on feet: Secondary | ICD-10-CM

## 2019-06-23 DIAGNOSIS — M6281 Muscle weakness (generalized): Secondary | ICD-10-CM

## 2019-06-23 DIAGNOSIS — M25511 Pain in right shoulder: Secondary | ICD-10-CM | POA: Diagnosis not present

## 2019-06-23 DIAGNOSIS — M25512 Pain in left shoulder: Secondary | ICD-10-CM

## 2019-06-23 DIAGNOSIS — R293 Abnormal posture: Secondary | ICD-10-CM

## 2019-06-23 DIAGNOSIS — G8929 Other chronic pain: Secondary | ICD-10-CM

## 2019-06-23 NOTE — Therapy (Signed)
[facility] Physical Therapy [address] [place], [place], [address] Phone: [phone]   Fax:  [phone]  Physical Therapy Treatment  Patient Details  Name: Eric Cruz MRN: [id number] Date of Birth: [birth year] Referring Provider (PT): Aundra Dubin, [place]   Encounter Date: 06/23/2019  PT End of Session - 06/23/19 1618    Visit Number  3    Number of Visits  16    Date for PT Re-Evaluation  08/04/19    PT Start Time  [id number]    PT Stop Time  1610    PT Time Calculation (min)  40 min    Activity Tolerance  Patient tolerated treatment well    Behavior During Therapy  [facility] for tasks assessed/performed       Past Medical History:  Diagnosis Date  . Arthritis   . BPH (benign prostatic hyperplasia)   . Cancer [facility])    Prostate: states he had a positive biopsy but had another one a year later and it was gone.   . Depression   . Dizziness   . GERD (gastroesophageal reflux disease)   . Gout   . High cholesterol   . Hypertension   . Sleep apnea    has cpap - not currently wearing  . Varicose veins     Past Surgical History:  Procedure Laterality Date  . ANTERIOR CERVICAL DECOMP/DISCECTOMY FUSION N/A 02/21/2018   Procedure: Cervical Five-Six Cervical Six-Seven Anterior cervical decompression/discectomy/fusion;  Surgeon: Ashok Pall, MD;  Location: [place];  Service: Neurosurgery;  Laterality: N/A;  Cervical Five-Six Cervical Six-Seven Anterior cervical decompression/discectomy/fusion  . BLADDER SURGERY    . CARPAL TUNNEL RELEASE Right 02/11/2019   Procedure: Right Carpal Tunnel Wound Irrigation;  Surgeon: Ashok Pall, MD;  Location: [place];  Service: Neurosurgery;  Laterality: Right;  Right Carpal tunnel wound exploration/wash out  . CHOLECYSTECTOMY  11/22/2012  . CHOLECYSTECTOMY  11/22/2012   Procedure: LAPAROSCOPIC CHOLECYSTECTOMY;  Surgeon: Gwenyth Ober, MD;  Location: [place];  Service: General;;  . COLONOSCOPY      There were no vitals filed  for this visit.  Subjective Assessment - 06/23/19 1617    Subjective  Pt arriving to therpay reporting 7/10 pain in his left knee. Pt amb with loft strand crutches.    Limitations  Standing;Walking    How long can you walk comfortably?  few steps - with axillary crutches    Patient Stated Goals  improve pain in Lt knee, improve mobility    Currently in Pain?  Yes    Pain Score  7     Pain Location  Knee    Pain Orientation  Left    Pain Descriptors / Indicators  Aching    Pain Type  Chronic pain    Pain Onset  More than a month ago                       [facility] Adult PT Treatment/Exercise - 06/23/19 0001      Ambulation/Gait   Gait Comments  Gait training using loft strands working on sequencing and posture.       Exercises   Exercises  Knee/Hip      Knee/Hip Exercises: Stretches   Active Hamstring Stretch  Both;2 reps;30 seconds      Knee/Hip Exercises: Aerobic   Nustep  L4 x 10 minutes      Knee/Hip Exercises: Seated   Marching  AROM;Strengthening;Both;15 reps    Sit to Sand  5  reps;with UE support   sitting on Airex to add height for easier lift off     Knee/Hip Exercises: Supine   Short Arc Quad Sets  Strengthening;Left;2 sets;10 reps    Heel Slides  AROM;Strengthening;Left;5 reps    Straight Leg Raises Limitations  unable to perform SLR on left    Other Supine Knee/Hip Exercises  ball squeezes x 10     Other Supine Knee/Hip Exercises  clams x 10 using blue theraband               PT Short Term Goals - 06/09/19 1603      PT SHORT TERM GOAL #1   Title  BERG and gait velocity to be performed with LTG to be written    Status  New    Target Date  06/23/19      PT SHORT TERM GOAL #2   Title  amb > 150' with forearm crutches without increase in pain for improved function    Status  New    Target Date  07/07/19      PT SHORT TERM GOAL #3   Title  report Lt knee pain < 4/10    Status  New    Target Date  07/07/19        PT Long Term  Goals - 06/23/19 1625      PT LONG TERM GOAL #1   Title  independent with HEP    Status  On-going      PT LONG TERM GOAL #2   Title  amb > 350' with LRAD without increase in pain for improved function    Status  On-going      PT LONG TERM GOAL #3   Title  negotiate 2-3 stairs without handrail and LRAD with supervision for improved function    Status  On-going      PT LONG TERM GOAL #4   Title  Pt will improve his BERG balance score from 31/56 to >/= 39/56.    Status  New            Plan - 06/23/19 1622    Clinical Impression Statement  Pt tolerating exercises well with no increased pain reproted. Pt was edu in sequencing using loft strand crutches will continue to need further instructions.  Concentration on LE strengthening. Continue skilled PT.    Personal Factors and Comorbidities  Comorbidity 3+;Transportation    Comorbidities  arthritis, prostate cancer, depression, HTN, ACDF, Rt CTR    Examination-Activity Limitations  Sit;Bed Mobility;Squat;Stairs;Stand;Transfers;Locomotion Level;Lift;Reach Overhead    Examination-Participation Restrictions  Other    Stability/Clinical Decision Making  Evolving/Moderate complexity    Rehab Potential  Good    PT Frequency  2x / week    PT Duration  8 weeks    PT Treatment/Interventions  ADLs/Self Care Home Management;Cryotherapy;Electrical Stimulation;Moist Heat;Iontophoresis 4mg /ml Dexamethasone;Balance training;Therapeutic exercise;Therapeutic activities;Functional mobility training;Stair training;Gait training;DME Instruction;Ultrasound;Neuromuscular re-education;Patient/family education;Manual techniques;Vasopneumatic Device;Taping;Dry needling    PT Next Visit Plan  establish HEP for LE/UE strengthening (seated/standing if pt can tolerate), needs a BERG and gait velocity, gait training with forearm crutches    Consulted and Agree with Plan of Care  Patient       Patient will benefit from skilled therapeutic intervention in order to  improve the following deficits and impairments:  Abnormal gait, Decreased endurance, Obesity, Decreased knowledge of use of DME, Decreased activity tolerance, Decreased strength, Pain, Impaired UE functional use, Difficulty walking, Decreased mobility, Decreased balance, Postural dysfunction, Impaired flexibility  Visit  Diagnosis: Chronic left shoulder pain  Chronic right shoulder pain  Abnormal posture  Muscle weakness (generalized)  Unsteadiness on feet  Chronic pain of left knee     Problem List Patient Active Problem List   Diagnosis Date Noted  . Wound infection after surgery 02/11/2019  . HNP (herniated nucleus pulposus) with myelopathy, cervical 02/21/2018  . Postop check 12/10/2012  . Symptomatic cholelithiasis 11/15/2012    Oretha Caprice, MPT 06/23/2019, 4:26 PM  [facility] Physical Therapy [address] [place], [place], [address] Phone: [phone]   Fax:  [phone]  Name: Eric Cruz MRN: [id number] Date of Birth: 05-Nov-1950

## 2019-06-27 ENCOUNTER — Encounter: Payer: Self-pay | Admitting: Rehabilitative and Restorative Service Providers"

## 2019-06-27 ENCOUNTER — Other Ambulatory Visit: Payer: Self-pay

## 2019-06-27 ENCOUNTER — Ambulatory Visit: Payer: Medicare Other | Admitting: Rehabilitative and Restorative Service Providers"

## 2019-06-27 DIAGNOSIS — M25562 Pain in left knee: Secondary | ICD-10-CM

## 2019-06-27 DIAGNOSIS — R293 Abnormal posture: Secondary | ICD-10-CM | POA: Diagnosis not present

## 2019-06-27 DIAGNOSIS — G8929 Other chronic pain: Secondary | ICD-10-CM

## 2019-06-27 DIAGNOSIS — M25511 Pain in right shoulder: Secondary | ICD-10-CM | POA: Diagnosis not present

## 2019-06-27 DIAGNOSIS — R2681 Unsteadiness on feet: Secondary | ICD-10-CM

## 2019-06-27 DIAGNOSIS — M6281 Muscle weakness (generalized): Secondary | ICD-10-CM

## 2019-06-27 DIAGNOSIS — M25512 Pain in left shoulder: Secondary | ICD-10-CM | POA: Diagnosis not present

## 2019-06-27 NOTE — Therapy (Signed)
[facility] Physical Therapy [address] [place], [place], [address] Phone: [phone]   Fax:  [phone]  Physical Therapy Treatment  Patient Details  Name: Eric Cruz MRN: [id number] Date of Birth: 1950-06-30 Referring Provider (PT): Aundra Dubin, [place]   Encounter Date: 06/27/2019  PT End of Session - 06/27/19 1525    Visit Number  4    Number of Visits  16    Date for PT Re-Evaluation  08/04/19    PT Start Time  1525    PT Stop Time  1604    PT Time Calculation (min)  39 min    Activity Tolerance  Patient tolerated treatment well    Behavior During Therapy  [facility] for tasks assessed/performed       Past Medical History:  Diagnosis Date  . Arthritis   . BPH (benign prostatic hyperplasia)   . Cancer [facility])    Prostate: states he had a positive biopsy but had another one a year later and it was gone.   . Depression   . Dizziness   . GERD (gastroesophageal reflux disease)   . Gout   . High cholesterol   . Hypertension   . Sleep apnea    has cpap - not currently wearing  . Varicose veins     Past Surgical History:  Procedure Laterality Date  . ANTERIOR CERVICAL DECOMP/DISCECTOMY FUSION N/A 02/21/2018   Procedure: Cervical Five-Six Cervical Six-Seven Anterior cervical decompression/discectomy/fusion;  Surgeon: Ashok Pall, MD;  Location: [place];  Service: Neurosurgery;  Laterality: N/A;  Cervical Five-Six Cervical Six-Seven Anterior cervical decompression/discectomy/fusion  . BLADDER SURGERY    . CARPAL TUNNEL RELEASE Right 02/11/2019   Procedure: Right Carpal Tunnel Wound Irrigation;  Surgeon: Ashok Pall, MD;  Location: [place];  Service: Neurosurgery;  Laterality: Right;  Right Carpal tunnel wound exploration/wash out  . CHOLECYSTECTOMY  11/22/2012  . CHOLECYSTECTOMY  11/22/2012   Procedure: LAPAROSCOPIC CHOLECYSTECTOMY;  Surgeon: Gwenyth Ober, MD;  Location: [place];  Service: General;;  . COLONOSCOPY      There were no vitals filed  for this visit.  Subjective Assessment - 06/27/19 1529    Subjective  Pt. indicated feeling a bit more today than in last few weeks.    Limitations  Standing;Walking    How long can you walk comfortably?  few steps - with axillary crutches    Patient Stated Goals  improve pain in Lt knee, improve mobility    Currently in Pain?  Yes    Pain Score  7     Pain Location  Knee    Pain Orientation  Left    Pain Onset  More than a month ago                       [facility] Adult PT Treatment/Exercise - 06/27/19 0001      Ambulation/Gait   Gait Comments  Continued ambulation tolerance training c cues at times for improved posture c forearm crutches      Knee/Hip Exercises: Aerobic   Nustep  L4 10 mins      Knee/Hip Exercises: Seated   Sit to Sand  20 reps;without UE support   22 inch table     Knee/Hip Exercises: Supine   Heel Slides  AROM;Strengthening;Left;15 reps    Other Supine Knee/Hip Exercises  supine hip add ball squeeze 5 seconds x 10    Other Supine Knee/Hip Exercises  supine clam blue band 2 x 10 each  Manual Therapy   Manual therapy comments  g2-g3 ap mobs Lt tibia for knee complaints.              PT Education - 06/27/19 1559    Education Details  Intervention cues throughout    [employer]) Educated  Patient    Methods  Explanation;Demonstration;Verbal cues    Comprehension  Verbalized understanding;Returned demonstration       PT Short Term Goals - 06/09/19 1603      PT SHORT TERM GOAL #1   Title  BERG and gait velocity to be performed with LTG to be written    Status  New    Target Date  06/23/19      PT SHORT TERM GOAL #2   Title  amb > 150' with forearm crutches without increase in pain for improved function    Status  New    Target Date  07/07/19      PT SHORT TERM GOAL #3   Title  report Lt knee pain < 4/10    Status  New    Target Date  07/07/19        PT Long Term Goals - 06/23/19 1625      PT LONG TERM GOAL #1   Title   independent with HEP    Status  On-going      PT LONG TERM GOAL #2   Title  amb > 350' with LRAD without increase in pain for improved function    Status  On-going      PT LONG TERM GOAL #3   Title  negotiate 2-3 stairs without handrail and LRAD with supervision for improved function    Status  On-going      PT LONG TERM GOAL #4   Title  Pt will improve his BERG balance score from 31/56 to >/= 39/56.    Status  New            Plan - 06/27/19 1559    Clinical Impression Statement  Lt knee mobility lacking in flexion c pain which impairs many functional movements at this time, as well as pain in WB.  Forearm crutch sequencing still lacking overall.  Continued skilled PT services indicated at this time.    Personal Factors and Comorbidities  Comorbidity 3+;Transportation    Comorbidities  arthritis, prostate cancer, depression, HTN, ACDF, Rt CTR    Examination-Activity Limitations  Sit;Bed Mobility;Squat;Stairs;Stand;Transfers;Locomotion Level;Lift;Reach Overhead    Examination-Participation Restrictions  Other    Stability/Clinical Decision Making  Evolving/Moderate complexity    Rehab Potential  Good    PT Frequency  2x / week    PT Duration  8 weeks    PT Treatment/Interventions  ADLs/Self Care Home Management;Cryotherapy;Electrical Stimulation;Moist Heat;Iontophoresis 4mg /ml Dexamethasone;Balance training;Therapeutic exercise;Therapeutic activities;Functional mobility training;Stair training;Gait training;DME Instruction;Ultrasound;Neuromuscular re-education;Patient/family education;Manual techniques;Vasopneumatic Device;Taping;Dry needling    PT Next Visit Plan  --    Consulted and Agree with Plan of Care  Patient       Patient will benefit from skilled therapeutic intervention in order to improve the following deficits and impairments:  Abnormal gait, Decreased endurance, Obesity, Decreased knowledge of use of DME, Decreased activity tolerance, Decreased strength, Pain,  Impaired UE functional use, Difficulty walking, Decreased mobility, Decreased balance, Postural dysfunction, Impaired flexibility  Visit Diagnosis: Chronic left shoulder pain  Chronic right shoulder pain  Abnormal posture  Muscle weakness (generalized)  Unsteadiness on feet  Chronic pain of left knee     Problem List Patient Active Problem List  Diagnosis Date Noted  . Wound infection after surgery 02/11/2019  . HNP (herniated nucleus pulposus) with myelopathy, cervical 02/21/2018  . Postop check 12/10/2012  . Symptomatic cholelithiasis 11/15/2012   Scot Jun, PT, DPT, OCS, ATC 06/27/19  4:05 PM    [facility] Therapy [address] [place], [place], [address] Phone: [phone]   Fax:  [phone]  Name: JULIENNE BERNSON MRN: [id number] Date of Birth: Jul 21, 1950

## 2019-06-30 ENCOUNTER — Other Ambulatory Visit: Payer: Self-pay

## 2019-06-30 ENCOUNTER — Encounter: Payer: Self-pay | Admitting: Physical Therapy

## 2019-06-30 ENCOUNTER — Ambulatory Visit: Payer: Medicare Other | Admitting: Physical Therapy

## 2019-06-30 DIAGNOSIS — R293 Abnormal posture: Secondary | ICD-10-CM

## 2019-06-30 DIAGNOSIS — M6281 Muscle weakness (generalized): Secondary | ICD-10-CM

## 2019-06-30 DIAGNOSIS — M25512 Pain in left shoulder: Secondary | ICD-10-CM | POA: Diagnosis not present

## 2019-06-30 DIAGNOSIS — M25562 Pain in left knee: Secondary | ICD-10-CM

## 2019-06-30 DIAGNOSIS — R2681 Unsteadiness on feet: Secondary | ICD-10-CM

## 2019-06-30 DIAGNOSIS — M25511 Pain in right shoulder: Secondary | ICD-10-CM

## 2019-06-30 DIAGNOSIS — G8929 Other chronic pain: Secondary | ICD-10-CM

## 2019-06-30 NOTE — Therapy (Signed)
[facility] Physical Therapy [address] [place], [place], [address] Phone: [phone]   Fax:  [phone]  Physical Therapy Treatment  Patient Details  Name: Eric Cruz MRN: [id number] Date of Birth: [birth year] Referring Provider (PT): Aundra Dubin, [place]   Encounter Date: 06/30/2019  PT End of Session - 06/30/19 1446    Visit Number  5    Number of Visits  16    Date for PT Re-Evaluation  08/04/19    PT Start Time  1326   pt arrived late   PT Stop Time  1400    PT Time Calculation (min)  34 min    Activity Tolerance  Patient tolerated treatment well    Behavior During Therapy  [facility] for tasks assessed/performed       Past Medical History:  Diagnosis Date  . Arthritis   . BPH (benign prostatic hyperplasia)   . Cancer [facility])    Prostate: states he had a positive biopsy but had another one a year later and it was gone.   . Depression   . Dizziness   . GERD (gastroesophageal reflux disease)   . Gout   . High cholesterol   . Hypertension   . Sleep apnea    has cpap - not currently wearing  . Varicose veins     Past Surgical History:  Procedure Laterality Date  . ANTERIOR CERVICAL DECOMP/DISCECTOMY FUSION N/A 02/21/2018   Procedure: Cervical Five-Six Cervical Six-Seven Anterior cervical decompression/discectomy/fusion;  Surgeon: Ashok Pall, MD;  Location: [place];  Service: Neurosurgery;  Laterality: N/A;  Cervical Five-Six Cervical Six-Seven Anterior cervical decompression/discectomy/fusion  . BLADDER SURGERY    . CARPAL TUNNEL RELEASE Right 02/11/2019   Procedure: Right Carpal Tunnel Wound Irrigation;  Surgeon: Ashok Pall, MD;  Location: [place];  Service: Neurosurgery;  Laterality: Right;  Right Carpal tunnel wound exploration/wash out  . CHOLECYSTECTOMY  11/22/2012  . CHOLECYSTECTOMY  11/22/2012   Procedure: LAPAROSCOPIC CHOLECYSTECTOMY;  Surgeon: Gwenyth Ober, MD;  Location: [place];  Service: General;;  . COLONOSCOPY      There  were no vitals filed for this visit.  Subjective Assessment - 06/30/19 1328    Subjective  Lt knee "is killing me today."    Limitations  Standing;Walking    How long can you walk comfortably?  few steps - with axillary crutches    Patient Stated Goals  improve pain in Lt knee, improve mobility    Currently in Pain?  Yes    Pain Score  10-Worst pain ever   down to 5/10 on NuStep   Pain Location  Knee    Pain Orientation  Left    Pain Descriptors / Indicators  Aching    Pain Type  Chronic pain    Pain Onset  More than a month ago    Pain Frequency  Constant    Aggravating Factors   standing, walking    Pain Relieving Factors  rest, medication                       [facility] Adult PT Treatment/Exercise - 06/30/19 1329      Ambulation/Gait   Gait velocity  1.05 ft/sec   19m = 31.22 sec   Gait Comments  Continued ambulation tolerance training c cues at times for improved posture c forearm crutches      Knee/Hip Exercises: Aerobic   Nustep  L4 10 mins      Knee/Hip Exercises: Seated  Sit to Sand  --   1 rep from 20" surface - relied on momentum to stand     Knee/Hip Exercises: Supine   Quad Sets  Left;20 reps    Short Arc Quad Sets  Left;20 reps    Hip Adduction Isometric  Both;20 reps    Bridges Limitations  pelvic tilt with gentle glute activation x 20 reps      Modalities   Modalities  Moist Heat      Moist Heat Therapy   Number Minutes Moist Heat  10 Minutes   with supine exercises   Moist Heat Location  Knee               PT Short Term Goals - 06/30/19 1447      PT SHORT TERM GOAL #1   Title  BERG and gait velocity to be performed with LTG to be written    Status  Achieved    Target Date  06/23/19      PT SHORT TERM GOAL #2   Title  amb > 150' with forearm crutches without increase in pain for improved function    Status  On-going    Target Date  07/07/19      PT SHORT TERM GOAL #3   Title  report Lt knee pain < 4/10    Status   On-going    Target Date  07/07/19        PT Long Term Goals - 06/30/19 1447      PT LONG TERM GOAL #1   Title  independent with HEP    Status  On-going    Target Date  08/04/19      PT LONG TERM GOAL #2   Title  amb > 350' with LRAD without increase in pain for improved function    Status  On-going      PT LONG TERM GOAL #3   Title  negotiate 2-3 stairs without handrail and LRAD with supervision for improved function    Status  On-going      PT LONG TERM GOAL #4   Title  Pt will improve his BERG balance score from 31/56 to >/= 39/56.    Status  New      PT LONG TERM GOAL #5   Title  improve gait velocity to > 1.8 ft/sec for decreased fall risk    Status  New    Target Date  08/04/19            Plan - 06/30/19 1534    Clinical Impression Statement  Pt with 1.05 f/tsec gait velocity indicating high fall risk.  Pain decreased with nustep today but any end range flexion increases pain.  Will continue to benefit from PT to maximize function.    Personal Factors and Comorbidities  Comorbidity 3+;Transportation    Comorbidities  arthritis, prostate cancer, depression, HTN, ACDF, Rt CTR    Examination-Activity Limitations  Sit;Bed Mobility;Squat;Stairs;Stand;Transfers;Locomotion Level;Lift;Reach Overhead    Examination-Participation Restrictions  Other    Stability/Clinical Decision Making  Evolving/Moderate complexity    Rehab Potential  Good    PT Frequency  2x / week    PT Duration  8 weeks    PT Treatment/Interventions  ADLs/Self Care Home Management;Cryotherapy;Electrical Stimulation;Moist Heat;Iontophoresis 4mg /ml Dexamethasone;Balance training;Therapeutic exercise;Therapeutic activities;Functional mobility training;Stair training;Gait training;DME Instruction;Ultrasound;Neuromuscular re-education;Patient/family education;Manual techniques;Vasopneumatic Device;Taping;Dry needling    PT Next Visit Plan  needs HEP - has not been given one yet, continue strengthening and  balance/endurance work  Consulted and Agree with Plan of Care  Patient       Patient will benefit from skilled therapeutic intervention in order to improve the following deficits and impairments:  Abnormal gait, Decreased endurance, Obesity, Decreased knowledge of use of DME, Decreased activity tolerance, Decreased strength, Pain, Impaired UE functional use, Difficulty walking, Decreased mobility, Decreased balance, Postural dysfunction, Impaired flexibility  Visit Diagnosis: Chronic left shoulder pain  Chronic right shoulder pain  Muscle weakness (generalized)  Abnormal posture  Unsteadiness on feet  Chronic pain of left knee     Problem List Patient Active Problem List   Diagnosis Date Noted  . Wound infection after surgery 02/11/2019  . HNP (herniated nucleus pulposus) with myelopathy, cervical 02/21/2018  . Postop check 12/10/2012  . Symptomatic cholelithiasis 11/15/2012      Laureen Abrahams, PT, DPT 06/30/19 3:46 PM    [facility] Physical Therapy [address] [place], [place], [address] Phone: [phone]   Fax:  [phone]  Name: Eric Cruz MRN: [id number] Date of Birth: [birth year]

## 2019-07-01 ENCOUNTER — Encounter: Payer: Medicare Other | Admitting: Rehabilitative and Restorative Service Providers"

## 2019-07-03 ENCOUNTER — Ambulatory Visit: Payer: Medicare Other | Admitting: Physical Therapy

## 2019-07-03 ENCOUNTER — Encounter: Payer: Self-pay | Admitting: Physical Therapy

## 2019-07-03 ENCOUNTER — Other Ambulatory Visit: Payer: Self-pay

## 2019-07-03 VITALS — BP 137/87 | HR 126

## 2019-07-03 DIAGNOSIS — M25511 Pain in right shoulder: Secondary | ICD-10-CM

## 2019-07-03 DIAGNOSIS — G8929 Other chronic pain: Secondary | ICD-10-CM

## 2019-07-03 DIAGNOSIS — M25512 Pain in left shoulder: Secondary | ICD-10-CM

## 2019-07-03 DIAGNOSIS — R293 Abnormal posture: Secondary | ICD-10-CM

## 2019-07-03 DIAGNOSIS — R2681 Unsteadiness on feet: Secondary | ICD-10-CM

## 2019-07-03 DIAGNOSIS — M25562 Pain in left knee: Secondary | ICD-10-CM

## 2019-07-03 DIAGNOSIS — M6281 Muscle weakness (generalized): Secondary | ICD-10-CM

## 2019-07-03 NOTE — Therapy (Signed)
[facility] [address] [place], [place], [address] Phone: [phone]   Fax:  [phone]  Physical Therapy Treatment (No charge visit - see below)  Patient Details  Name: Eric Cruz MRN: [id number] Date of Birth: 02/07/1951 Referring Provider (PT): Aundra Dubin, [place]   Encounter Date: 07/03/2019  PT End of Session - 07/03/19 1517    Visit Number  5    Number of Visits  16    Date for PT Re-Evaluation  08/04/19    PT Start Time  [id number]    PT Stop Time  1535    PT Time Calculation (min)  50 min    Activity Tolerance  Patient tolerated treatment well    Behavior During Therapy  [facility] for tasks assessed/performed       Past Medical History:  Diagnosis Date  . Arthritis   . BPH (benign prostatic hyperplasia)   . Cancer [facility])    Prostate: states he had a positive biopsy but had another one a year later and it was gone.   . Depression   . Dizziness   . GERD (gastroesophageal reflux disease)   . Gout   . High cholesterol   . Hypertension   . Sleep apnea    has cpap - not currently wearing  . Varicose veins     Past Surgical History:  Procedure Laterality Date  . ANTERIOR CERVICAL DECOMP/DISCECTOMY FUSION N/A 02/21/2018   Procedure: Cervical Five-Six Cervical Six-Seven Anterior cervical decompression/discectomy/fusion;  Surgeon: Ashok Pall, MD;  Location: [place];  Service: Neurosurgery;  Laterality: N/A;  Cervical Five-Six Cervical Six-Seven Anterior cervical decompression/discectomy/fusion  . BLADDER SURGERY    . CARPAL TUNNEL RELEASE Right 02/11/2019   Procedure: Right Carpal Tunnel Wound Irrigation;  Surgeon: Ashok Pall, MD;  Location: [place];  Service: Neurosurgery;  Laterality: Right;  Right Carpal tunnel wound exploration/wash out  . CHOLECYSTECTOMY  11/22/2012  . CHOLECYSTECTOMY  11/22/2012   Procedure: LAPAROSCOPIC CHOLECYSTECTOMY;  Surgeon: Gwenyth Ober, MD;  Location: [place];  Service: General;;  . COLONOSCOPY       Vitals:   07/03/19 1453  BP: 137/87  Pulse: (!) 126  SpO2: 97%    Subjective Assessment - 07/03/19 1452    Subjective  "I feel like I'm getting worse."  C/o weakness and fatigue.  Lt knee is still very painful.    Limitations  Standing;Walking    How long can you walk comfortably?  few steps - with axillary crutches    Patient Stated Goals  improve pain in Lt knee, improve mobility    Currently in Pain?  Yes    Pain Score  5     Pain Location  Knee    Pain Orientation  Left    Pain Descriptors / Indicators  Aching    Pain Type  Chronic pain    Pain Onset  More than a month ago    Pain Frequency  Constant    Aggravating Factors   standing, walking    Pain Relieving Factors  rest, medication                                 PT Short Term Goals - 06/30/19 1447      PT SHORT TERM GOAL #1   Title  BERG and gait velocity to be performed with LTG to be written    Status  Achieved    Target Date  06/23/19      PT SHORT TERM GOAL #2   Title  amb > 150' with forearm crutches without increase in pain for improved function    Status  On-going    Target Date  07/07/19      PT SHORT TERM GOAL #3   Title  report Lt knee pain < 4/10    Status  On-going    Target Date  07/07/19        PT Long Term Goals - 06/30/19 1447      PT LONG TERM GOAL #1   Title  independent with HEP    Status  On-going    Target Date  08/04/19      PT LONG TERM GOAL #2   Title  amb > 350' with LRAD without increase in pain for improved function    Status  On-going      PT LONG TERM GOAL #3   Title  negotiate 2-3 stairs without handrail and LRAD with supervision for improved function    Status  On-going      PT LONG TERM GOAL #4   Title  Pt will improve his BERG balance score from 31/56 to >/= 39/56.    Status  New      PT LONG TERM GOAL #5   Title  improve gait velocity to > 1.8 ft/sec for decreased fall risk    Status  New    Target Date  08/04/19             Plan - 07/03/19 1610    Clinical Impression Statement  Pt arrived to session with c/o increased fatigue and general malaise.  Vitals assessed and mostly stable with noted elevated resting HR.  Reassessed after 5 min of rest and still elevated at 120 bpm.  After 5 additional min HR 112 bpm.   Walked ~ 30' with HR up to 135 bpm.  Recommended he call PCP, pt declined as he didn't Cruz to potentially go to ED.  Stated if he refused to go, instructed in how to monitor HR at home, and if any change in symptoms or HR remained elevated, he needed to seek medical attention.  Wife instructed as well.    Personal Factors and Comorbidities  Comorbidity 3+;Transportation    Comorbidities  arthritis, prostate cancer, depression, HTN, ACDF, Rt CTR    Examination-Activity Limitations  Sit;Bed Mobility;Squat;Stairs;Stand;Transfers;Locomotion Level;Lift;Reach Overhead    Examination-Participation Restrictions  Other    Stability/Clinical Decision Making  Evolving/Moderate complexity    Rehab Potential  Good    PT Frequency  2x / week    PT Duration  8 weeks    PT Treatment/Interventions  ADLs/Self Care Home Management;Cryotherapy;Electrical Stimulation;Moist Heat;Iontophoresis 4mg /ml Dexamethasone;Balance training;Therapeutic exercise;Therapeutic activities;Functional mobility training;Stair training;Gait training;DME Instruction;Ultrasound;Neuromuscular re-education;Patient/family education;Manual techniques;Vasopneumatic Device;Taping;Dry needling    PT Next Visit Plan  needs HEP - has not been given one yet, continue strengthening and balance/endurance work; see how HR is    Consulted and Agree with Plan of Care  Patient       Patient will benefit from skilled therapeutic intervention in order to improve the following deficits and impairments:  Abnormal gait, Decreased endurance, Obesity, Decreased knowledge of use of DME, Decreased activity tolerance, Decreased strength, Pain, Impaired UE  functional use, Difficulty walking, Decreased mobility, Decreased balance, Postural dysfunction, Impaired flexibility  Visit Diagnosis: Chronic left shoulder pain  Chronic right shoulder pain  Muscle weakness (generalized)  Abnormal posture  Unsteadiness on feet  Chronic pain  of left knee     Problem List Patient Active Problem List   Diagnosis Date Noted  . Wound infection after surgery 02/11/2019  . HNP (herniated nucleus pulposus) with myelopathy, cervical 02/21/2018  . Postop check 12/10/2012  . Symptomatic cholelithiasis 11/15/2012      Laureen Abrahams, PT, DPT 07/03/19 4:13 PM    [facility] [address] [place], [place], [address] Phone: [phone]   Fax:  [phone]  Name: Eric Cruz MRN: [id number] Date of Birth: 03-Aug-1950

## 2019-07-07 ENCOUNTER — Other Ambulatory Visit: Payer: Self-pay

## 2019-07-07 ENCOUNTER — Encounter: Payer: Self-pay | Admitting: Physical Therapy

## 2019-07-07 ENCOUNTER — Ambulatory Visit (INDEPENDENT_AMBULATORY_CARE_PROVIDER_SITE_OTHER): Payer: Medicare Other | Admitting: Physical Therapy

## 2019-07-07 VITALS — BP 125/91 | HR 135

## 2019-07-07 DIAGNOSIS — M6281 Muscle weakness (generalized): Secondary | ICD-10-CM

## 2019-07-07 DIAGNOSIS — M25511 Pain in right shoulder: Secondary | ICD-10-CM

## 2019-07-07 DIAGNOSIS — R293 Abnormal posture: Secondary | ICD-10-CM

## 2019-07-07 DIAGNOSIS — R2681 Unsteadiness on feet: Secondary | ICD-10-CM

## 2019-07-07 DIAGNOSIS — G8929 Other chronic pain: Secondary | ICD-10-CM

## 2019-07-07 DIAGNOSIS — M25562 Pain in left knee: Secondary | ICD-10-CM

## 2019-07-07 DIAGNOSIS — M25512 Pain in left shoulder: Secondary | ICD-10-CM

## 2019-07-07 NOTE — Therapy (Signed)
[facility] [address] [place], [place], [address] Phone: [phone]   Fax:  [phone]  Physical Therapy Note (No charge visit)  Patient Details  Name: Eric Cruz MRN: [id number] Date of Birth: [birth year] Referring Provider (PT): Aundra Dubin, [place]   Encounter Date: 07/07/2019  PT End of Session - 07/07/19 1605    Visit Number  5    Number of Visits  16    Date for PT Re-Evaluation  08/04/19    PT Start Time  1520    PT Stop Time  1600    PT Time Calculation (min)  40 min    Activity Tolerance  Treatment limited secondary to medical complications (Comment)    Behavior During Therapy  [facility] for tasks assessed/performed       Past Medical History:  Diagnosis Date  . Arthritis   . BPH (benign prostatic hyperplasia)   . Cancer [facility])    Prostate: states he had a positive biopsy but had another one a year later and it was gone.   . Depression   . Dizziness   . GERD (gastroesophageal reflux disease)   . Gout   . High cholesterol   . Hypertension   . Sleep apnea    has cpap - not currently wearing  . Varicose veins     Past Surgical History:  Procedure Laterality Date  . ANTERIOR CERVICAL DECOMP/DISCECTOMY FUSION N/A 02/21/2018   Procedure: Cervical Five-Six Cervical Six-Seven Anterior cervical decompression/discectomy/fusion;  Surgeon: Ashok Pall, MD;  Location: [place];  Service: Neurosurgery;  Laterality: N/A;  Cervical Five-Six Cervical Six-Seven Anterior cervical decompression/discectomy/fusion  . BLADDER SURGERY    . CARPAL TUNNEL RELEASE Right 02/11/2019   Procedure: Right Carpal Tunnel Wound Irrigation;  Surgeon: Ashok Pall, MD;  Location: [place];  Service: Neurosurgery;  Laterality: Right;  Right Carpal tunnel wound exploration/wash out  . CHOLECYSTECTOMY  11/22/2012  . CHOLECYSTECTOMY  11/22/2012   Procedure: LAPAROSCOPIC CHOLECYSTECTOMY;  Surgeon: Gwenyth Ober, MD;  Location: [place];  Service: General;;  .  COLONOSCOPY      Vitals:   07/07/19 1531  BP: (!) 125/91  Pulse: (!) 135    Subjective Assessment - 07/07/19 1527    Subjective  Lt knee "is giving me a fit."  over the weekend HR at rest 99-125 bpm    Limitations  Standing;Walking    How long can you walk comfortably?  few steps - with axillary crutches    Patient Stated Goals  improve pain in Lt knee, improve mobility    Pain Onset  More than a month ago                       [facility] Adult PT Treatment/Exercise - 07/07/19 1527      Transfers   Sit to Stand  3: Mod assist;With upper extremity assist;With armrests;From chair/3-in-1    Sit to Stand Details  Manual facilitation for weight shifting    Sit to Stand Details (indicate cue type and reason)  multiple attempts with mod cues for technique               PT Short Term Goals - 07/07/19 1605      PT SHORT TERM GOAL #1   Title  BERG and gait velocity to be performed with LTG to be written    Status  Achieved    Target Date  06/23/19      PT SHORT TERM GOAL #2  Title  amb > 150' with forearm crutches without increase in pain for improved function    Baseline  4/5: unable to assess due to medical complications    Status  On-going    Target Date  07/07/19      PT SHORT TERM GOAL #3   Title  report Lt knee pain < 4/10    Baseline  4/5: unable to assess due to medical complications    Status  On-going    Target Date  07/07/19        PT Long Term Goals - 06/30/19 1447      PT LONG TERM GOAL #1   Title  independent with HEP    Status  On-going    Target Date  08/04/19      PT LONG TERM GOAL #2   Title  amb > 350' with LRAD without increase in pain for improved function    Status  On-going      PT LONG TERM GOAL #3   Title  negotiate 2-3 stairs without handrail and LRAD with supervision for improved function    Status  On-going      PT LONG TERM GOAL #4   Title  Pt will improve his BERG balance score from 31/56 to >/= 39/56.    Status   New      PT LONG TERM GOAL #5   Title  improve gait velocity to > 1.8 ft/sec for decreased fall risk    Status  New    Target Date  08/04/19            Plan - 07/07/19 1606    Clinical Impression Statement  Pt still with elevated HR at rest today, and pt agreeable to call PCP today.  Appt set up for tomorrow 4/6 @ 8:45 am.  Pt and wife verbalized understanding and need for MD clearance to return to PT.    Personal Factors and Comorbidities  Comorbidity 3+;Transportation    Comorbidities  arthritis, prostate cancer, depression, HTN, ACDF, Rt CTR    Examination-Activity Limitations  Sit;Bed Mobility;Squat;Stairs;Stand;Transfers;Locomotion Level;Lift;Reach Overhead    Examination-Participation Restrictions  Other    Stability/Clinical Decision Making  Evolving/Moderate complexity    Rehab Potential  Good    PT Frequency  2x / week    PT Duration  8 weeks    PT Treatment/Interventions  ADLs/Self Care Home Management;Cryotherapy;Electrical Stimulation;Moist Heat;Iontophoresis 4mg /ml Dexamethasone;Balance training;Therapeutic exercise;Therapeutic activities;Functional mobility training;Stair training;Gait training;DME Instruction;Ultrasound;Neuromuscular re-education;Patient/family education;Manual techniques;Vasopneumatic Device;Taping;Dry needling    PT Next Visit Plan  needs HEP - has not been given one yet, continue strengthening and balance/endurance work; see how HR is    Consulted and Agree with Plan of Care  Patient       Patient will benefit from skilled therapeutic intervention in order to improve the following deficits and impairments:  Abnormal gait, Decreased endurance, Obesity, Decreased knowledge of use of DME, Decreased activity tolerance, Decreased strength, Pain, Impaired UE functional use, Difficulty walking, Decreased mobility, Decreased balance, Postural dysfunction, Impaired flexibility  Visit Diagnosis: Chronic left shoulder pain  Chronic right shoulder  pain  Muscle weakness (generalized)  Abnormal posture  Unsteadiness on feet  Chronic pain of left knee     Problem List Patient Active Problem List   Diagnosis Date Noted  . Wound infection after surgery 02/11/2019  . HNP (herniated nucleus pulposus) with myelopathy, cervical 02/21/2018  . Postop check 12/10/2012  . Symptomatic cholelithiasis 11/15/2012      Laureen Abrahams,  PT, DPT 07/07/19 4:08 PM    [facility] [address] [place], [place], [address] Phone: [phone]   Fax:  [phone]  Name: Eric Cruz MRN: [id number] Date of Birth: December 21, 1950

## 2019-07-08 ENCOUNTER — Other Ambulatory Visit: Payer: Self-pay | Admitting: Internal Medicine

## 2019-07-08 ENCOUNTER — Encounter: Payer: Medicare Other | Admitting: Rehabilitative and Restorative Service Providers"

## 2019-07-08 ENCOUNTER — Other Ambulatory Visit: Payer: Medicare Other

## 2019-07-08 DIAGNOSIS — R002 Palpitations: Secondary | ICD-10-CM

## 2019-07-09 ENCOUNTER — Ambulatory Visit
Admission: RE | Admit: 2019-07-09 | Discharge: 2019-07-09 | Disposition: A | Discharge status: Home / Self Care | Payer: Medicare Other | Source: Ambulatory Visit | Attending: Internal Medicine | Admitting: Internal Medicine

## 2019-07-09 DIAGNOSIS — R002 Palpitations: Secondary | ICD-10-CM

## 2019-07-09 MED ORDER — IOPAMIDOL (ISOVUE-370) INJECTION 76%
75.0000 mL | Freq: Once | INTRAVENOUS | Status: AC | PRN
  Administered 2019-07-09: 75 mL via INTRAVENOUS

## 2019-07-10 ENCOUNTER — Encounter: Payer: Self-pay | Admitting: Rehabilitative and Restorative Service Providers"

## 2019-07-10 ENCOUNTER — Ambulatory Visit (INDEPENDENT_AMBULATORY_CARE_PROVIDER_SITE_OTHER): Payer: Medicare Other | Admitting: Rehabilitative and Restorative Service Providers"

## 2019-07-10 ENCOUNTER — Other Ambulatory Visit: Payer: Self-pay

## 2019-07-10 VITALS — HR 121

## 2019-07-10 DIAGNOSIS — M25512 Pain in left shoulder: Secondary | ICD-10-CM | POA: Diagnosis not present

## 2019-07-10 DIAGNOSIS — M6281 Muscle weakness (generalized): Secondary | ICD-10-CM

## 2019-07-10 DIAGNOSIS — R2681 Unsteadiness on feet: Secondary | ICD-10-CM

## 2019-07-10 DIAGNOSIS — M25511 Pain in right shoulder: Secondary | ICD-10-CM | POA: Diagnosis not present

## 2019-07-10 DIAGNOSIS — R293 Abnormal posture: Secondary | ICD-10-CM

## 2019-07-10 DIAGNOSIS — G8929 Other chronic pain: Secondary | ICD-10-CM

## 2019-07-10 DIAGNOSIS — M25562 Pain in left knee: Secondary | ICD-10-CM

## 2019-07-10 NOTE — Therapy (Signed)
[facility] Physical Therapy [address] [place], [place], [address] Phone: [phone]   Fax:  [phone]  Physical Therapy Treatment  Patient Details  Name: Eric Cruz MRN: [id number] Date of Birth: 1951-01-22 Referring Provider (PT): Aundra Dubin, [place]   Encounter Date: 07/10/2019  PT End of Session - 07/10/19 1538    Visit Number  6    Number of Visits  16    Date for PT Re-Evaluation  08/04/19    PT Start Time  1526    PT Stop Time  1606    PT Time Calculation (min)  40 min    Activity Tolerance  Treatment limited secondary to medical complications (Comment)    Behavior During Therapy  [facility] for tasks assessed/performed       Past Medical History:  Diagnosis Date  . Arthritis   . BPH (benign prostatic hyperplasia)   . Cancer [facility])    Prostate: states he had a positive biopsy but had another one a year later and it was gone.   . Depression   . Dizziness   . GERD (gastroesophageal reflux disease)   . Gout   . High cholesterol   . Hypertension   . Sleep apnea    has cpap - not currently wearing  . Varicose veins     Past Surgical History:  Procedure Laterality Date  . ANTERIOR CERVICAL DECOMP/DISCECTOMY FUSION N/A 02/21/2018   Procedure: Cervical Five-Six Cervical Six-Seven Anterior cervical decompression/discectomy/fusion;  Surgeon: Ashok Pall, MD;  Location: [place];  Service: Neurosurgery;  Laterality: N/A;  Cervical Five-Six Cervical Six-Seven Anterior cervical decompression/discectomy/fusion  . BLADDER SURGERY    . CARPAL TUNNEL RELEASE Right 02/11/2019   Procedure: Right Carpal Tunnel Wound Irrigation;  Surgeon: Ashok Pall, MD;  Location: [place];  Service: Neurosurgery;  Laterality: Right;  Right Carpal tunnel wound exploration/wash out  . CHOLECYSTECTOMY  11/22/2012  . CHOLECYSTECTOMY  11/22/2012   Procedure: LAPAROSCOPIC CHOLECYSTECTOMY;  Surgeon: Gwenyth Ober, MD;  Location: [place];  Service: General;;  . COLONOSCOPY       Vitals:   07/10/19 0330  Pulse: (!) 121    Subjective Assessment - 07/10/19 1534    Subjective  Pt. indicated Lt knee continues to hurt moderate to severe at times.  Pt. stated MD tested for PE and was negative.    Limitations  Standing;Walking    How long can you walk comfortably?  few steps - with axillary crutches    Patient Stated Goals  improve pain in Lt knee, improve mobility    Currently in Pain?  Yes    Pain Score  6     Pain Location  Knee    Pain Orientation  Left    Pain Descriptors / Indicators  Aching    Pain Type  Chronic pain    Pain Onset  More than a month ago    Pain Frequency  Constant    Aggravating Factors   standing, walking, squat, transfers    Pain Relieving Factors  avoid those activity, light movement.                       [place] Adult PT Treatment/Exercise - 07/10/19 0001      Ambulation/Gait   Ambulation/Gait  Yes    Ambulation/Gait Assistance  5: Supervision   c cues   Ambulation/Gait Assistance Details  Cues for sequencing of RUE only forearm crutch    Ambulation Distance (Feet)  100 Feet  Gait Comments  Improved gait speed c single crutch but reduced stability noted.      Knee/Hip Exercises: Stretches   Gastroc Stretch  3 reps   15 seconds     Knee/Hip Exercises: Aerobic   Nustep  L4 10 mins      Knee/Hip Exercises: Standing   Other Standing Knee Exercises  church pew fwd/rev postural sway x 20      Knee/Hip Exercises: Seated   Other Seated Knee/Hip Exercises  isometric 5 sec on/off for knee extension Lt in 90 deg, 45 deg      Manual Therapy   Manual therapy comments  seated knee distraction, IR, flexion for mobility             PT Education - 07/10/19 1535    Education Details  Cues for intervention    Person(s) Educated  Patient    Methods  Explanation    Comprehension  Verbalized understanding;Returned demonstration;Verbal cues required       PT Short Term Goals - 07/07/19 1605      PT SHORT  TERM GOAL #1   Title  BERG and gait velocity to be performed with LTG to be written    Status  Achieved    Target Date  06/23/19      PT SHORT TERM GOAL #2   Title  amb > 150' with forearm crutches without increase in pain for improved function    Baseline  4/5: unable to assess due to medical complications    Status  On-going    Target Date  07/07/19      PT SHORT TERM GOAL #3   Title  report Lt knee pain < 4/10    Baseline  4/5: unable to assess due to medical complications    Status  On-going    Target Date  07/07/19        PT Long Term Goals - 06/30/19 1447      PT LONG TERM GOAL #1   Title  independent with HEP    Status  On-going    Target Date  08/04/19      PT LONG TERM GOAL #2   Title  amb > 350' with LRAD without increase in pain for improved function    Status  On-going      PT LONG TERM GOAL #3   Title  negotiate 2-3 stairs without handrail and LRAD with supervision for improved function    Status  On-going      PT LONG TERM GOAL #4   Title  Pt will improve his BERG balance score from 31/56 to >/= 39/56.    Status  New      PT LONG TERM GOAL #5   Title  improve gait velocity to > 1.8 ft/sec for decreased fall risk    Status  New    Target Date  08/04/19            Plan - 07/10/19 1536    Clinical Impression Statement  Presentation today c HR around 115-125 at times with minimal variability in sitting activity.  Lt kne continues to show pain in WB activity, overall BLE weakness that impairs daily activity/functional movement.    Personal Factors and Comorbidities  Comorbidity 3+;Transportation    Comorbidities  arthritis, prostate cancer, depression, HTN, ACDF, Rt CTR    Examination-Activity Limitations  Sit;Bed Mobility;Squat;Stairs;Stand;Transfers;Locomotion Level;Lift;Reach Overhead    Examination-Participation Restrictions  Other    Stability/Clinical Decision Making  Evolving/Moderate complexity  Rehab Potential  Good    PT Frequency  2x /  week    PT Duration  8 weeks    PT Treatment/Interventions  ADLs/Self Care Home Management;Cryotherapy;Electrical Stimulation;Moist Heat;Iontophoresis 4mg /ml Dexamethasone;Balance training;Therapeutic exercise;Therapeutic activities;Functional mobility training;Stair training;Gait training;DME Instruction;Ultrasound;Neuromuscular re-education;Patient/family education;Manual techniques;Vasopneumatic Device;Taping;Dry needling    PT Next Visit Plan  Continue to promote improved Lt knee mobility, strength for functional movement.    Consulted and Agree with Plan of Care  Patient       Patient will benefit from skilled therapeutic intervention in order to improve the following deficits and impairments:  Abnormal gait, Decreased endurance, Obesity, Decreased knowledge of use of DME, Decreased activity tolerance, Decreased strength, Pain, Impaired UE functional use, Difficulty walking, Decreased mobility, Decreased balance, Postural dysfunction, Impaired flexibility  Visit Diagnosis: Chronic left shoulder pain  Chronic right shoulder pain  Muscle weakness (generalized)  Abnormal posture  Unsteadiness on feet  Chronic pain of left knee     Problem List Patient Active Problem List   Diagnosis Date Noted  . Wound infection after surgery 02/11/2019  . HNP (herniated nucleus pulposus) with myelopathy, cervical 02/21/2018  . Postop check 12/10/2012  . Symptomatic cholelithiasis 11/15/2012   Scot Jun, PT, DPT, OCS, ATC 07/10/19  4:07 PM    [facility] [address] [place], [place], [address] Phone: [phone]   Fax:  [phone]  Name: TRUSTON GENTRY MRN: [id number] Date of Birth: 1951-03-23

## 2019-07-18 ENCOUNTER — Encounter: Payer: Self-pay | Admitting: Rehabilitative and Restorative Service Providers"

## 2019-07-18 ENCOUNTER — Ambulatory Visit: Payer: Medicare Other | Admitting: Rehabilitative and Restorative Service Providers"

## 2019-07-18 ENCOUNTER — Other Ambulatory Visit: Payer: Self-pay

## 2019-07-18 DIAGNOSIS — R293 Abnormal posture: Secondary | ICD-10-CM

## 2019-07-18 DIAGNOSIS — M25512 Pain in left shoulder: Secondary | ICD-10-CM

## 2019-07-18 DIAGNOSIS — M6281 Muscle weakness (generalized): Secondary | ICD-10-CM

## 2019-07-18 DIAGNOSIS — M25511 Pain in right shoulder: Secondary | ICD-10-CM | POA: Diagnosis not present

## 2019-07-18 DIAGNOSIS — G8929 Other chronic pain: Secondary | ICD-10-CM

## 2019-07-18 DIAGNOSIS — M25562 Pain in left knee: Secondary | ICD-10-CM

## 2019-07-18 DIAGNOSIS — R2681 Unsteadiness on feet: Secondary | ICD-10-CM

## 2019-07-18 NOTE — Therapy (Addendum)
[facility] [address] [place], [place], [address] Phone: [phone]   Fax:  [phone]  Physical Therapy Treatment  Patient Details  Name: Eric Cruz MRN: [id number] Date of Birth: Jun 05, 1950 Referring Provider (PT): Aundra Dubin, [place]   Encounter Date: 07/18/2019  PT End of Session - 07/18/19 1539    Visit Number  7    Number of Visits  16    Date for PT Re-Evaluation  08/04/19    PT Start Time  [id number]    PT Stop Time  1610    PT Time Calculation (min)  40 min    Activity Tolerance  Treatment limited secondary to medical complications (Comment)    Behavior During Therapy  [facility] for tasks assessed/performed       Past Medical History:  Diagnosis Date  . Arthritis   . BPH (benign prostatic hyperplasia)   . Cancer [facility])    Prostate: states he had a positive biopsy but had another one a year later and it was gone.   . Depression   . Dizziness   . GERD (gastroesophageal reflux disease)   . Gout   . High cholesterol   . Hypertension   . Sleep apnea    has cpap - not currently wearing  . Varicose veins     Past Surgical History:  Procedure Laterality Date  . ANTERIOR CERVICAL DECOMP/DISCECTOMY FUSION N/A 02/21/2018   Procedure: Cervical Five-Six Cervical Six-Seven Anterior cervical decompression/discectomy/fusion;  Surgeon: Ashok Pall, MD;  Location: [place];  Service: Neurosurgery;  Laterality: N/A;  Cervical Five-Six Cervical Six-Seven Anterior cervical decompression/discectomy/fusion  . BLADDER SURGERY    . CARPAL TUNNEL RELEASE Right 02/11/2019   Procedure: Right Carpal Tunnel Wound Irrigation;  Surgeon: Ashok Pall, MD;  Location: [place];  Service: Neurosurgery;  Laterality: Right;  Right Carpal tunnel wound exploration/wash out  . CHOLECYSTECTOMY  11/22/2012  . CHOLECYSTECTOMY  11/22/2012   Procedure: LAPAROSCOPIC CHOLECYSTECTOMY;  Surgeon: Gwenyth Ober, MD;  Location: [place];  Service: General;;  . COLONOSCOPY       There were no vitals filed for this visit.                    [facility] Adult PT Treatment/Exercise - 07/18/19 0001      Ambulation/Gait   Ambulation/Gait  Yes    Ambulation/Gait Assistance  5: Supervision    Ambulation/Gait Assistance Details  Sequencing cues   100 ft x 2     Knee/Hip Exercises: Aerobic   Nustep  lvl 5 12 mins      Knee/Hip Exercises: Standing   Other Standing Knee Exercises  church pew fwd/rev postural sway x 20    Other Standing Knee Exercises  step up/down 6 inch in // bars c CGA c sequencing cues for good leg up, Lt LE down first for household use      Knee/Hip Exercises: Seated   Long Arc Quad  3 sets;10 reps;Left    Other Seated Knee/Hip Exercises  isometric 5 sec on/off for knee extension Lt in 90 deg, 45 deg    Sit to Sand  10 reps   22 inch table      Manual Therapy   Manual therapy comments  seated knee distraction, IR, flexion for mobility               PT Short Term Goals - 07/07/19 1605      PT SHORT TERM GOAL #1   Title  BERG  and gait velocity to be performed with LTG to be written    Status  Achieved    Target Date  06/23/19      PT SHORT TERM GOAL #2   Title  amb > 150' with forearm crutches without increase in pain for improved function    Baseline  4/5: unable to assess due to medical complications    Status  On-going    Target Date  07/07/19      PT SHORT TERM GOAL #3   Title  report Lt knee pain < 4/10    Baseline  4/5: unable to assess due to medical complications    Status  On-going    Target Date  07/07/19        PT Long Term Goals - 06/30/19 1447      PT LONG TERM GOAL #1   Title  independent with HEP    Status  On-going    Target Date  08/04/19      PT LONG TERM GOAL #2   Title  amb > 350' with LRAD without increase in pain for improved function    Status  On-going      PT LONG TERM GOAL #3   Title  negotiate 2-3 stairs without handrail and LRAD with supervision for improved function     Status  On-going      PT LONG TERM GOAL #4   Title  Pt will improve his BERG balance score from 31/56 to >/= 39/56.    Status  New      PT LONG TERM GOAL #5   Title  improve gait velocity to > 1.8 ft/sec for decreased fall risk    Status  New    Target Date  08/04/19          07/18/19 1400  Plan  Clinical Impression Statement Reports of short term relief from manual intervention and mobility training in clinic but still reporting difficulty c chair tranfers (poor technique, strength limitations).   Personal Factors and Comorbidities Comorbidity 3+  Comorbidities arthritis, prostate cancer, depression, HTN, ACDF, Rt CTR  Examination-Activity Limitations Sit;Bed Mobility;Squat;Stairs;Stand;Transfers;Locomotion Level;Lift;Reach Overhead  Pt will benefit from skilled therapeutic intervention in order to improve on the following deficits Abnormal gait;Decreased endurance;Obesity;Decreased knowledge of use of DME;Decreased activity tolerance;Decreased strength;Pain;Impaired UE functional use;Difficulty walking;Decreased mobility;Decreased balance;Postural dysfunction;Impaired flexibility  Stability/Clinical Decision Making Evolving/Moderate complexity  PT Frequency 2x / week  PT Duration 8 weeks  PT Treatment/Interventions ADLs/Self Care Home Management;Cryotherapy;Electrical Stimulation;Moist Heat;Iontophoresis 4mg /ml Dexamethasone;Balance training;Therapeutic exercise;Therapeutic activities;Functional mobility training;Stair training;Gait training;DME Instruction;Ultrasound;Neuromuscular re-education;Patient/family education;Manual techniques;Vasopneumatic Device;Taping;Dry needling  PT Next Visit Plan Mobility training  Consulted and Agree with Plan of Care Patient          Patient will benefit from skilled therapeutic intervention in order to improve the following deficits and impairments:     Visit Diagnosis: Chronic left shoulder pain  Chronic right shoulder pain  Muscle  weakness (generalized)  Abnormal posture  Unsteadiness on feet  Chronic pain of left knee     Problem List Patient Active Problem List   Diagnosis Date Noted  . Wound infection after surgery 02/11/2019  . HNP (herniated nucleus pulposus) with myelopathy, cervical 02/21/2018  . Postop check 12/10/2012  . Symptomatic cholelithiasis 11/15/2012   Scot Jun, PT, DPT, OCS, ATC 07/21/19  2:04 PM      [facility] [address] [place], [place], [address] Phone: [phone]   Fax:  [phone]  Name: Eric Cruz  MRN: [id number] Date of Birth: 11-Aug-1950

## 2019-07-21 ENCOUNTER — Other Ambulatory Visit: Payer: Self-pay

## 2019-07-21 ENCOUNTER — Encounter: Payer: Medicare Other | Admitting: Rehabilitative and Restorative Service Providers"

## 2019-07-21 ENCOUNTER — Ambulatory Visit: Payer: Medicare Other | Admitting: Rehabilitative and Restorative Service Providers"

## 2019-07-21 ENCOUNTER — Encounter: Payer: Self-pay | Admitting: Rehabilitative and Restorative Service Providers"

## 2019-07-21 DIAGNOSIS — R2681 Unsteadiness on feet: Secondary | ICD-10-CM

## 2019-07-21 DIAGNOSIS — M25511 Pain in right shoulder: Secondary | ICD-10-CM | POA: Diagnosis not present

## 2019-07-21 DIAGNOSIS — R293 Abnormal posture: Secondary | ICD-10-CM | POA: Diagnosis not present

## 2019-07-21 DIAGNOSIS — M25562 Pain in left knee: Secondary | ICD-10-CM

## 2019-07-21 DIAGNOSIS — G8929 Other chronic pain: Secondary | ICD-10-CM

## 2019-07-21 DIAGNOSIS — M25512 Pain in left shoulder: Secondary | ICD-10-CM

## 2019-07-21 DIAGNOSIS — M6281 Muscle weakness (generalized): Secondary | ICD-10-CM | POA: Diagnosis not present

## 2019-07-21 NOTE — Therapy (Signed)
[facility] Therapy [address] [place], [place], [address] Phone: [phone]   Fax:  [phone]  Physical Therapy Treatment  Patient Details  Name: Eric Cruz MRN: [id number] Date of Birth: [birth year] Referring Provider (PT): Aundra Dubin, [place]   Encounter Date: 07/21/2019  PT End of Session - 07/21/19 1318    Visit Number  8    Number of Visits  16    Date for PT Re-Evaluation  08/04/19    PT Start Time  1310    PT Stop Time  1353    PT Time Calculation (min)  43 min    Activity Tolerance  Treatment limited secondary to medical complications (Comment)    Behavior During Therapy  [facility] for tasks assessed/performed       Past Medical History:  Diagnosis Date  . Arthritis   . BPH (benign prostatic hyperplasia)   . Cancer [facility])    Prostate: states he had a positive biopsy but had another one a year later and it was gone.   . Depression   . Dizziness   . GERD (gastroesophageal reflux disease)   . Gout   . High cholesterol   . Hypertension   . Sleep apnea    has cpap - not currently wearing  . Varicose veins     Past Surgical History:  Procedure Laterality Date  . ANTERIOR CERVICAL DECOMP/DISCECTOMY FUSION N/A 02/21/2018   Procedure: Cervical Five-Six Cervical Six-Seven Anterior cervical decompression/discectomy/fusion;  Surgeon: Ashok Pall, MD;  Location: [place];  Service: Neurosurgery;  Laterality: N/A;  Cervical Five-Six Cervical Six-Seven Anterior cervical decompression/discectomy/fusion  . BLADDER SURGERY    . CARPAL TUNNEL RELEASE Right 02/11/2019   Procedure: Right Carpal Tunnel Wound Irrigation;  Surgeon: Ashok Pall, MD;  Location: [place];  Service: Neurosurgery;  Laterality: Right;  Right Carpal tunnel wound exploration/wash out  . CHOLECYSTECTOMY  11/22/2012  . CHOLECYSTECTOMY  11/22/2012   Procedure: LAPAROSCOPIC CHOLECYSTECTOMY;  Surgeon: Gwenyth Ober, MD;  Location: [place];  Service: General;;  . COLONOSCOPY       There were no vitals filed for this visit.  Subjective Assessment - 07/21/19 1316    Subjective  Pt. indicated feeling like his knee was temporarily improved for a short time after last visit.  Pt. indicated pain 6-7/10 with walking today.    Limitations  Standing;Walking    How long can you walk comfortably?  few steps - with axillary crutches    Patient Stated Goals  improve pain in Lt knee, improve mobility    Currently in Pain?  Yes    Pain Score  6     Pain Location  Knee    Pain Orientation  Left    Pain Descriptors / Indicators  Aching    Pain Type  Chronic pain    Pain Onset  More than a month ago    Pain Frequency  Intermittent    Aggravating Factors   standing, walking, stairs    Pain Relieving Factors  resting not standing                       [facility] Adult PT Treatment/Exercise - 07/21/19 0001      Ambulation/Gait   Ambulation/Gait  Yes    Ambulation/Gait Assistance  5: Supervision    Ambulation/Gait Assistance Details  occasional sequencing cues.     Gait Comments  100 ft x 2, 50 ft   fatigue noted, marked reduced gait speed  Knee/Hip Exercises: Aerobic   Nustep  lvl 5 12 mins      Knee/Hip Exercises: Standing   Other Standing Knee Exercises  standing hip abd x 15 bilateral c HHA on parallel bars    Other Standing Knee Exercises  step up/down 6 inch in // bars x4  c CGA c sequencing cues for good leg up, Lt LE down first for household use      Knee/Hip Exercises: Seated   Sit to Sand  without UE support;15 reps   22 inch height c UE anterior pull band and min A     Manual Therapy   Manual therapy comments  seated knee distraction, IR, flexion for mobility               PT Short Term Goals - 07/07/19 1605      PT SHORT TERM GOAL #1   Title  BERG and gait velocity to be performed with LTG to be written    Status  Achieved    Target Date  06/23/19      PT SHORT TERM GOAL #2   Title  amb > 150' with forearm crutches without  increase in pain for improved function    Baseline  4/5: unable to assess due to medical complications    Status  On-going    Target Date  07/07/19      PT SHORT TERM GOAL #3   Title  report Lt knee pain < 4/10    Baseline  4/5: unable to assess due to medical complications    Status  On-going    Target Date  07/07/19        PT Long Term Goals - 06/30/19 1447      PT LONG TERM GOAL #1   Title  independent with HEP    Status  On-going    Target Date  08/04/19      PT LONG TERM GOAL #2   Title  amb > 350' with LRAD without increase in pain for improved function    Status  On-going      PT LONG TERM GOAL #3   Title  negotiate 2-3 stairs without handrail and LRAD with supervision for improved function    Status  On-going      PT LONG TERM GOAL #4   Title  Pt will improve his BERG balance score from 31/56 to >/= 39/56.    Status  New      PT LONG TERM GOAL #5   Title  improve gait velocity to > 1.8 ft/sec for decreased fall risk    Status  New    Target Date  08/04/19            Plan - 07/21/19 1354    Clinical Impression Statement  Extensive cues for progressive mobility improvements in transfers including weight shift and positioning.  Pt. still relied on HHA and min A from 22 inches but improved in quality following cues.  Lt knee pain persistent and present in daily progressive mobility, impairing and limiting activity and independence.    Personal Factors and Comorbidities  Comorbidity 3+;Transportation    Comorbidities  arthritis, prostate cancer, depression, HTN, ACDF, Rt CTR    Examination-Activity Limitations  Sit;Bed Mobility;Squat;Stairs;Stand;Transfers;Locomotion Level;Lift;Reach Overhead    Examination-Participation Restrictions  Other    Stability/Clinical Decision Making  Evolving/Moderate complexity    Rehab Potential  Good    PT Frequency  2x / week    PT Duration  8  weeks    PT Treatment/Interventions  ADLs/Self Care Home  Management;Cryotherapy;Electrical Stimulation;Moist Heat;Iontophoresis 4mg /ml Dexamethasone;Balance training;Therapeutic exercise;Therapeutic activities;Functional mobility training;Stair training;Gait training;DME Instruction;Ultrasound;Neuromuscular re-education;Patient/family education;Manual techniques;Vasopneumatic Device;Taping;Dry needling    PT Next Visit Plan  Progressive mobility improvements, Lt knee strength    Consulted and Agree with Plan of Care  Patient       Patient will benefit from skilled therapeutic intervention in order to improve the following deficits and impairments:  Abnormal gait, Decreased endurance, Obesity, Decreased knowledge of use of DME, Decreased activity tolerance, Decreased strength, Pain, Impaired UE functional use, Difficulty walking, Decreased mobility, Decreased balance, Postural dysfunction, Impaired flexibility  Visit Diagnosis: Chronic left shoulder pain  Chronic right shoulder pain  Muscle weakness (generalized)  Abnormal posture  Unsteadiness on feet  Chronic pain of left knee     Problem List Patient Active Problem List   Diagnosis Date Noted  . Wound infection after surgery 02/11/2019  . HNP (herniated nucleus pulposus) with myelopathy, cervical 02/21/2018  . Postop check 12/10/2012  . Symptomatic cholelithiasis 11/15/2012    Scot Jun, PT, DPT, OCS, ATC 07/21/19  1:56 PM    [facility] [address] [place], [place], [address] Phone: [phone]   Fax:  [phone]  Name: Eric Cruz MRN: [id number] Date of Birth: 17-Jun-1950

## 2019-07-23 ENCOUNTER — Encounter: Payer: Medicare Other | Admitting: Physical Therapy

## 2019-07-25 ENCOUNTER — Other Ambulatory Visit: Payer: Self-pay

## 2019-07-25 ENCOUNTER — Encounter: Payer: Self-pay | Admitting: Rehabilitative and Restorative Service Providers"

## 2019-07-25 ENCOUNTER — Ambulatory Visit: Payer: Medicare Other | Admitting: Rehabilitative and Restorative Service Providers"

## 2019-07-25 DIAGNOSIS — M6281 Muscle weakness (generalized): Secondary | ICD-10-CM

## 2019-07-25 DIAGNOSIS — M25512 Pain in left shoulder: Secondary | ICD-10-CM | POA: Diagnosis not present

## 2019-07-25 DIAGNOSIS — R293 Abnormal posture: Secondary | ICD-10-CM | POA: Diagnosis not present

## 2019-07-25 DIAGNOSIS — M25511 Pain in right shoulder: Secondary | ICD-10-CM | POA: Diagnosis not present

## 2019-07-25 DIAGNOSIS — G8929 Other chronic pain: Secondary | ICD-10-CM

## 2019-07-25 DIAGNOSIS — R2681 Unsteadiness on feet: Secondary | ICD-10-CM

## 2019-07-25 DIAGNOSIS — M25562 Pain in left knee: Secondary | ICD-10-CM

## 2019-07-25 NOTE — Therapy (Addendum)
[facility] Physical Therapy [address] [place], [place], [address] Phone: [phone]   Fax:  [phone]  Physical Therapy Treatment/Discharge Summary  Patient Details  Name: Eric Cruz MRN: [id number] Date of Birth: 1950/10/13 Referring Provider (PT): Aundra Dubin, [place]   Encounter Date: 07/25/2019  PT End of Session - 07/25/19 1539    Visit Number  9    Number of Visits  16    Date for PT Re-Evaluation  08/04/19    PT Start Time  7412    PT Stop Time  1605    PT Time Calculation (min)  36 min    Equipment Utilized During Treatment  Gait belt    Activity Tolerance  Treatment limited secondary to medical complications (Comment)    Behavior During Therapy  [facility] for tasks assessed/performed       Past Medical History:  Diagnosis Date  . Arthritis   . BPH (benign prostatic hyperplasia)   . Cancer [facility])    Prostate: states he had a positive biopsy but had another one a year later and it was gone.   . Depression   . Dizziness   . GERD (gastroesophageal reflux disease)   . Gout   . High cholesterol   . Hypertension   . Sleep apnea    has cpap - not currently wearing  . Varicose veins     Past Surgical History:  Procedure Laterality Date  . ANTERIOR CERVICAL DECOMP/DISCECTOMY FUSION N/A 02/21/2018   Procedure: Cervical Five-Six Cervical Six-Seven Anterior cervical decompression/discectomy/fusion;  Surgeon: Ashok Pall, MD;  Location: [place];  Service: Neurosurgery;  Laterality: N/A;  Cervical Five-Six Cervical Six-Seven Anterior cervical decompression/discectomy/fusion  . BLADDER SURGERY    . CARPAL TUNNEL RELEASE Right 02/11/2019   Procedure: Right Carpal Tunnel Wound Irrigation;  Surgeon: Ashok Pall, MD;  Location: [place];  Service: Neurosurgery;  Laterality: Right;  Right Carpal tunnel wound exploration/wash out  . CHOLECYSTECTOMY  11/22/2012  . CHOLECYSTECTOMY  11/22/2012   Procedure: LAPAROSCOPIC CHOLECYSTECTOMY;  Surgeon: Gwenyth Ober, MD;  Location: [place];  Service: General;;  . COLONOSCOPY      There were no vitals filed for this visit.  Subjective Assessment - 07/25/19 1537    Subjective  Pt. indicated continued pain and trouble from Lt knee at this time.  Feeling like he just can't seem to get it better.    Limitations  Standing;Walking    How long can you walk comfortably?  few steps - with axillary crutches    Patient Stated Goals  improve pain in Lt knee, improve mobility    Currently in Pain?  Yes    Pain Score  6     Pain Location  Knee    Pain Orientation  Left    Pain Descriptors / Indicators  Aching    Pain Onset  More than a month ago    Pain Frequency  Constant    Aggravating Factors   any standing, walking, bending of Lt knee    Pain Relieving Factors  rest    Effect of Pain on Daily Activities  Limitation in standing activity in home and community                       [facility] Adult PT Treatment/Exercise - 07/25/19 0001      Ambulation/Gait   Ambulation/Gait  Yes    Ambulation/Gait Assistance  5: Supervision   occasional CGA   Ambulation/Gait Assistance Details  posture upright cues and sequencing    Gait Comments  100 ft, 50 ft x 3      Knee/Hip Exercises: Seated   Sit to Sand  Other (comment)   sit to stand c min A c cues for sequencing/weight shift x 10     Modalities   Modalities  Vasopneumatic      Vasopneumatic   Number Minutes Vasopneumatic   6 minutes    Vasopnuematic Location   Knee    Vasopneumatic Pressure  Low    Vasopneumatic Temperature   34               PT Short Term Goals - 07/07/19 1605      PT SHORT TERM GOAL #1   Title  BERG and gait velocity to be performed with LTG to be written    Status  Achieved    Target Date  06/23/19      PT SHORT TERM GOAL #2   Title  amb > 150' with forearm crutches without increase in pain for improved function    Baseline  4/5: unable to assess due to medical complications    Status  On-going     Target Date  07/07/19      PT SHORT TERM GOAL #3   Title  report Lt knee pain < 4/10    Baseline  4/5: unable to assess due to medical complications    Status  On-going    Target Date  07/07/19        PT Long Term Goals - 06/30/19 1447      PT LONG TERM GOAL #1   Title  independent with HEP    Status  On-going    Target Date  08/04/19      PT LONG TERM GOAL #2   Title  amb > 350' with LRAD without increase in pain for improved function    Status  On-going      PT LONG TERM GOAL #3   Title  negotiate 2-3 stairs without handrail and LRAD with supervision for improved function    Status  On-going      PT LONG TERM GOAL #4   Title  Pt will improve his BERG balance score from 31/56 to >/= 39/56.    Status  New      PT LONG TERM GOAL #5   Title  improve gait velocity to > 1.8 ft/sec for decreased fall risk    Status  New    Target Date  08/04/19            Plan - 07/25/19 1541    Clinical Impression Statement  Standing activity, transfers, and other progressive mobility moderate to severely limited 2/2 Lt knee complaints at this time.  UE complaints partially due to increased WB pressure during transfer and walking activity.  Overall pain level severity and irritability continue to impact ability to perform intervention for strength/mobility gains.    Personal Factors and Comorbidities  Comorbidity 3+;Transportation    Comorbidities  arthritis, prostate cancer, depression, HTN, ACDF, Rt CTR    Examination-Activity Limitations  Sit;Bed Mobility;Squat;Stairs;Stand;Transfers;Locomotion Level;Lift;Reach Overhead    Examination-Participation Restrictions  Other    Stability/Clinical Decision Making  Evolving/Moderate complexity    Rehab Potential  Good    PT Frequency  2x / week    PT Duration  8 weeks    PT Treatment/Interventions  ADLs/Self Care Home Management;Cryotherapy;Electrical Stimulation;Moist Heat;Iontophoresis 92m/ml Dexamethasone;Balance training;Therapeutic  exercise;Therapeutic activities;Functional mobility training;Stair training;Gait training;DME  Instruction;Ultrasound;Neuromuscular re-education;Patient/family education;Manual techniques;Vasopneumatic Device;Taping;Dry needling    PT Next Visit Plan  Recommend follow up with ortho on Lt knee status    Consulted and Agree with Plan of Care  Patient       Patient will benefit from skilled therapeutic intervention in order to improve the following deficits and impairments:  Abnormal gait, Decreased endurance, Obesity, Decreased knowledge of use of DME, Decreased activity tolerance, Decreased strength, Pain, Impaired UE functional use, Difficulty walking, Decreased mobility, Decreased balance, Postural dysfunction, Impaired flexibility  Visit Diagnosis: Chronic left shoulder pain  Chronic right shoulder pain  Muscle weakness (generalized)  Abnormal posture  Unsteadiness on feet  Chronic pain of left knee     Problem List Patient Active Problem List   Diagnosis Date Noted  . Wound infection after surgery 02/11/2019  . HNP (herniated nucleus pulposus) with myelopathy, cervical 02/21/2018  . Postop check 12/10/2012  . Symptomatic cholelithiasis 11/15/2012   Scot Jun, PT, DPT, OCS, ATC 07/25/19  4:03 PM    [facility] [address] [place], [place], [address] Phone: [phone]   Fax:  [phone]  Name: YIDEL TEUSCHER MRN: [id number] Date of Birth: Jan 07, 1951     PHYSICAL THERAPY DISCHARGE SUMMARY  Visits from Start of Care: 9  Current functional level related to goals / functional outcomes: See above   Remaining deficits: See above   Education / Equipment: HEP  Plan: Patient agrees to discharge.  Patient goals were partially met. Patient is being discharged due to a change in medical status.  ?????    Pt referred back to MD due to continued knee pain.  Laureen Abrahams, PT, DPT 09/09/19 9:56 AM  [facility] [address] [place], [place], [address] Phone: [phone]   Fax:  [phone]

## 2019-07-28 ENCOUNTER — Encounter: Payer: Medicare Other | Admitting: Physical Therapy

## 2019-07-30 ENCOUNTER — Ambulatory Visit: Payer: Medicare Other | Admitting: Orthopaedic Surgery

## 2019-07-30 ENCOUNTER — Encounter: Payer: Self-pay | Admitting: Orthopaedic Surgery

## 2019-07-30 ENCOUNTER — Other Ambulatory Visit: Payer: Self-pay

## 2019-07-30 DIAGNOSIS — M1712 Unilateral primary osteoarthritis, left knee: Secondary | ICD-10-CM

## 2019-07-30 NOTE — Progress Notes (Signed)
Office Visit Note   Patient: Eric Cruz           Date of Birth: April 22, 1950           MRN: [id number] Visit Date: 07/30/2019              Requested by: Seward Carol, MD [address] [place],  [place] [address] PCP: Seward Carol, MD   Assessment & Plan: Visit Diagnoses:  1. Unilateral primary osteoarthritis, left knee     Plan: Impression is chronic left knee pain.  At this point we will need to obtain an MRI to rule out structural abnormalities or stress fracture or meniscal pathology.  Follow-up after the MRI.  Follow-Up Instructions: Return in about 2 weeks (around 08/13/2019).   Orders:  Orders Placed This Encounter  Procedures  . MR Knee Left w/o contrast   No orders of the defined types were placed in this encounter.     Procedures: No procedures performed   Clinical Data: No additional findings.   Subjective: Chief Complaint  Patient presents with  . Left Knee - Pain, Follow-up    Mr. Gulbrandsen returns today for continued left knee pain.  He has been going to physical therapy.  Previous Visco injection gave him no relief.  Continues to hurt on the medial side.   Review of Systems   Objective: Vital Signs: There were no vitals taken for this visit.  Physical Exam  Ortho Exam Left knee shows medial sided knee pain.  No joint effusion.  Bony tenderness of the medial side. Specialty Comments:  No specialty comments available.  Imaging: No results found.   PMFS History: Patient Active Problem List   Diagnosis Date Noted  . Wound infection after surgery 02/11/2019  . HNP (herniated nucleus pulposus) with myelopathy, cervical 02/21/2018  . Postop check 12/10/2012  . Symptomatic cholelithiasis 11/15/2012   Past Medical History:  Diagnosis Date  . Arthritis   . BPH (benign prostatic hyperplasia)   . Cancer [facility])    Prostate: states he had a positive biopsy but had another one a year later and it was gone.   . Depression     . Dizziness   . GERD (gastroesophageal reflux disease)   . Gout   . High cholesterol   . Hypertension   . Sleep apnea    has cpap - not currently wearing  . Varicose veins     No family history on file.  Past Surgical History:  Procedure Laterality Date  . ANTERIOR CERVICAL DECOMP/DISCECTOMY FUSION N/A 02/21/2018   Procedure: Cervical Five-Six Cervical Six-Seven Anterior cervical decompression/discectomy/fusion;  Surgeon: Ashok Pall, MD;  Location: [place];  Service: Neurosurgery;  Laterality: N/A;  Cervical Five-Six Cervical Six-Seven Anterior cervical decompression/discectomy/fusion  . BLADDER SURGERY    . CARPAL TUNNEL RELEASE Right 02/11/2019   Procedure: Right Carpal Tunnel Wound Irrigation;  Surgeon: Ashok Pall, MD;  Location: [place];  Service: Neurosurgery;  Laterality: Right;  Right Carpal tunnel wound exploration/wash out  . CHOLECYSTECTOMY  11/22/2012  . CHOLECYSTECTOMY  11/22/2012   Procedure: LAPAROSCOPIC CHOLECYSTECTOMY;  Surgeon: Gwenyth Ober, MD;  Location: [place];  Service: General;;  . COLONOSCOPY     Social History   Occupational History  . Not on file  Tobacco Use  . Smoking status: Former Smoker    Quit date: 04/22/1998    Years since quitting: 21.2  . Smokeless tobacco: Never Used  Substance and Sexual Activity  . Alcohol use:  No  . Drug use: No  . Sexual activity: Not on file

## 2019-08-01 ENCOUNTER — Encounter: Payer: Medicare Other | Admitting: Rehabilitative and Restorative Service Providers"

## 2019-08-04 ENCOUNTER — Encounter: Payer: Medicare Other | Admitting: Rehabilitative and Restorative Service Providers"

## 2019-08-11 ENCOUNTER — Encounter: Payer: Medicare Other | Admitting: Rehabilitative and Restorative Service Providers"

## 2019-08-13 ENCOUNTER — Ambulatory Visit: Payer: Medicare Other | Admitting: Orthopaedic Surgery

## 2019-08-27 ENCOUNTER — Other Ambulatory Visit: Payer: Self-pay | Admitting: Gastroenterology

## 2019-08-28 ENCOUNTER — Ambulatory Visit
Admission: RE | Admit: 2019-08-28 | Discharge: 2019-08-28 | Disposition: A | Discharge status: Home / Self Care | Payer: Medicare Other | Source: Ambulatory Visit | Attending: Orthopaedic Surgery | Admitting: Orthopaedic Surgery

## 2019-08-28 ENCOUNTER — Other Ambulatory Visit: Payer: Self-pay

## 2019-08-28 DIAGNOSIS — M1712 Unilateral primary osteoarthritis, left knee: Secondary | ICD-10-CM

## 2019-08-29 ENCOUNTER — Telehealth: Payer: Self-pay | Admitting: Radiology

## 2019-08-29 NOTE — Telephone Encounter (Signed)
Left another voice mail, this time at the home number, to call back; and, if he does not get this message before 5 pm., we will just see him at his already scheduled appointment with Dr. Erlinda Hong on 09/02/19.

## 2019-08-29 NOTE — Telephone Encounter (Signed)
Received stat call report of MRI knee due to findings. Dr. Erlinda Hong not in office this afternoon, sent copy of report to Dr. Junius Roads for review.

## 2019-08-29 NOTE — Telephone Encounter (Signed)
It appears that the knee has been a problem for a while now, making infection less likely.  Is he having fevers or chills?  If so, we should bring him in this afternoon for joint aspiration and labs.  If he otherwise feels fine, we can get him in next week with Eric Cruz.

## 2019-08-29 NOTE — Telephone Encounter (Signed)
Left message on the mobile voice mail for patient to call me back.

## 2019-08-30 ENCOUNTER — Other Ambulatory Visit: Payer: Medicare Other

## 2019-09-02 ENCOUNTER — Other Ambulatory Visit: Payer: Self-pay

## 2019-09-02 ENCOUNTER — Encounter: Payer: Self-pay | Admitting: Orthopaedic Surgery

## 2019-09-02 ENCOUNTER — Ambulatory Visit: Payer: Medicare Other | Admitting: Orthopaedic Surgery

## 2019-09-02 DIAGNOSIS — M25562 Pain in left knee: Secondary | ICD-10-CM | POA: Diagnosis not present

## 2019-09-02 DIAGNOSIS — G8929 Other chronic pain: Secondary | ICD-10-CM

## 2019-09-02 MED ORDER — METHYLPREDNISOLONE ACETATE 40 MG/ML IJ SUSP
40.0000 mg | INTRAMUSCULAR | Status: AC | PRN
  Administered 2019-09-02: 40 mg via INTRA_ARTICULAR

## 2019-09-02 MED ORDER — BUPIVACAINE HCL 0.5 % IJ SOLN
2.0000 mL | INTRAMUSCULAR | Status: AC | PRN
  Administered 2019-09-02: 2 mL via INTRA_ARTICULAR

## 2019-09-02 MED ORDER — TRAMADOL HCL 50 MG PO TABS: 50.0000 mg | ORAL_TABLET | Freq: Every day | ORAL | 0 refills | Status: DC | PRN

## 2019-09-02 MED ORDER — LIDOCAINE HCL 1 % IJ SOLN
2.0000 mL | INTRAMUSCULAR | Status: AC | PRN
  Administered 2019-09-02: 2 mL

## 2019-09-02 NOTE — Progress Notes (Signed)
Office Visit Note   Patient: Eric Cruz           Date of Birth: [birth year]           MRN: [id number] Visit Date: 09/02/2019              Requested by: Seward Carol, MD [address] [place],  [place] [address] PCP: Seward Carol, MD   Assessment & Plan: Visit Diagnoses:  1. Chronic pain of left knee     Plan: MRI of the left knee shows a severe synovitis consistent with inflammatory changes but cannot rule out infection therefore we aspirated the knee today obtained about 5 cc of blood-tinged fluid without any evidence of infection.  This was sent to the lab for cell count and cultures to rule out infection.  Thus far he has not had any relief from previous cortisone or Visco injections.  We will notify the patient of the fluid results once they are available.  Follow-Up Instructions: Return if symptoms worsen or fail to improve.   Orders:  Orders Placed This Encounter  Procedures  . Anaerobic and Aerobic Culture  . Cell count + diff,  w/ cryst-synvl fld   Meds ordered this encounter  Medications  . traMADol (ULTRAM) 50 MG tablet    Sig: Take 1-2 tablets (50-100 mg total) by mouth daily as needed.    Dispense:  20 tablet    Refill:  0      Procedures: Large Joint Inj: L knee on 09/02/2019 4:56 PM Details: 22 G needle Medications: 2 mL bupivacaine 0.5 %; 2 mL lidocaine 1 %; 40 mg methylPREDNISolone acetate 40 MG/ML Outcome: tolerated well, no immediate complications Patient was prepped and draped in the usual sterile fashion.       Clinical Data: No additional findings.   Subjective: Chief Complaint  Patient presents with  . Left Knee - Pain    Eric Cruz returns today for follow-up of left knee MRI.  He continues to have severe pain.  He would like a refill of his tramadol.  Denies any constitutional symptoms.   Review of Systems   Objective: Vital Signs: There were no vitals taken for this visit.  Physical Exam  Ortho Exam  Left knee shows a trace joint effusion.  No warmth or cellulitis.  Painful range of motion. Specialty Comments:  No specialty comments available.  Imaging: No results found.   PMFS History: Patient Active Problem List   Diagnosis Date Noted  . Wound infection after surgery 02/11/2019  . HNP (herniated nucleus pulposus) with myelopathy, cervical 02/21/2018  . Postop check 12/10/2012  . Symptomatic cholelithiasis 11/15/2012   Past Medical History:  Diagnosis Date  . Arthritis   . BPH (benign prostatic hyperplasia)   . Cancer [facility])    Prostate: states he had a positive biopsy but had another one a year later and it was gone.   . Depression   . Dizziness   . GERD (gastroesophageal reflux disease)   . Gout   . High cholesterol   . Hypertension   . Sleep apnea    has cpap - not currently wearing  . Varicose veins     History reviewed. No pertinent family history.  Past Surgical History:  Procedure Laterality Date  . ANTERIOR CERVICAL DECOMP/DISCECTOMY FUSION N/A 02/21/2018   Procedure: Cervical Five-Six Cervical Six-Seven Anterior cervical decompression/discectomy/fusion;  Surgeon: Ashok Pall, MD;  Location: [place];  Service: Neurosurgery;  Laterality: N/A;  Cervical  Five-Six Cervical Six-Seven Anterior cervical decompression/discectomy/fusion  . BLADDER SURGERY    . CARPAL TUNNEL RELEASE Right 02/11/2019   Procedure: Right Carpal Tunnel Wound Irrigation;  Surgeon: Ashok Pall, MD;  Location: [place];  Service: Neurosurgery;  Laterality: Right;  Right Carpal tunnel wound exploration/wash out  . CHOLECYSTECTOMY  11/22/2012  . CHOLECYSTECTOMY  11/22/2012   Procedure: LAPAROSCOPIC CHOLECYSTECTOMY;  Surgeon: Gwenyth Ober, MD;  Location: [place];  Service: General;;  . COLONOSCOPY     Social History   Occupational History  . Not on file  Tobacco Use  . Smoking status: Former Smoker    Quit date: 04/22/1998    Years since quitting: 21.3  . Smokeless tobacco: Never Used   Substance and Sexual Activity  . Alcohol use: No  . Drug use: No  . Sexual activity: Not on file

## 2019-09-08 LAB — SYNOVIAL CELL COUNT + DIFF, W/ CRYSTALS
Basophils, %: 0 %
Eosinophils-Synovial: 0 % (ref 0–2)
Lymphocytes-Synovial Fld: 16 % (ref 0–74)
Monocyte/Macrophage: 3 % (ref 0–69)
Neutrophil, Synovial: 81 % — ABNORMAL HIGH (ref 0–24)
Synoviocytes, %: 0 % (ref 0–15)
WBC, Synovial: 5097 cells/uL — ABNORMAL HIGH (ref <150)

## 2019-09-08 LAB — ANAEROBIC AND AEROBIC CULTURE
AER RESULT:: NO GROWTH
MICRO NUMBER:: 10538431
MICRO NUMBER:: 10538432
SPECIMEN QUALITY:: ADEQUATE
SPECIMEN QUALITY:: ADEQUATE

## 2019-09-08 NOTE — Progress Notes (Signed)
Please let patient know that the fluid does not show any infection.  He should make another appt with me to discuss treatments

## 2019-09-09 ENCOUNTER — Telehealth: Payer: Self-pay | Admitting: Orthopaedic Surgery

## 2019-09-09 NOTE — Telephone Encounter (Signed)
Patient called. He would like someone to call him with lab results. His call back number is [phone]

## 2019-09-09 NOTE — Telephone Encounter (Signed)
Per Dr Erlinda Hong come back in to discuss treatment options - fluid was normal.   Appt made and mailed out reminder to his house.

## 2019-09-10 NOTE — Progress Notes (Signed)
I spoke with patient and gave results. He has follow up appt for next Tuesday. He asked what was going to be done because the pain is getting worse. I advised Dr. Erlinda Hong will discuss at office visit.

## 2019-09-16 ENCOUNTER — Ambulatory Visit: Payer: Medicare Other | Admitting: Orthopaedic Surgery

## 2019-09-16 DIAGNOSIS — M659 Synovitis and tenosynovitis, unspecified: Secondary | ICD-10-CM

## 2019-09-16 DIAGNOSIS — M65962 Unspecified synovitis and tenosynovitis, left lower leg: Secondary | ICD-10-CM | POA: Insufficient documentation

## 2019-09-16 NOTE — Addendum Note (Signed)
Addended by: Precious Bard on: 09/16/2019 04:46 PM   Modules accepted: Orders

## 2019-09-16 NOTE — Progress Notes (Signed)
Office Visit Note   Patient: Eric Cruz           Date of Birth: 1950/10/06           MRN: [id number] Visit Date: 09/16/2019              Requested by: Seward Carol, MD [address] [place],  [place] [address] PCP: Seward Carol, MD   Assessment & Plan: Visit Diagnoses:  1. Synovitis of left knee     Plan: I reviewed the MRI again in detail with the patient and overall he has a lot of inflammatory changes around his knee.  His knee aspiration was negative for infection.  At this point I like to obtain arthritis panel to evaluate for inflammatory arthritis.  If those results are negative I have recommended arthroscopic synovectomy and partial medial meniscectomy in the operating room.  We will notify the patient of the results shortly  Follow-Up Instructions: Return if symptoms worsen or fail to improve.   Orders:  No orders of the defined types were placed in this encounter.  No orders of the defined types were placed in this encounter.     Procedures: No procedures performed   Clinical Data: No additional findings.   Subjective: Chief Complaint  Patient presents with  . Left Knee - Pain    Mr.Noguchi returns today for chronic left knee pain.  He states the cortisone injection has not given him much relief.  Denies any constitutional symptoms.   Review of Systems   Objective: Vital Signs: There were no vitals taken for this visit.  Physical Exam  Ortho Exam Left knee exam is unchanged. Specialty Comments:  No specialty comments available.  Imaging: No results found.   PMFS History: Patient Active Problem List   Diagnosis Date Noted  . Synovitis of left knee 09/16/2019  . Wound infection after surgery 02/11/2019  . HNP (herniated nucleus pulposus) with myelopathy, cervical 02/21/2018  . Postop check 12/10/2012  . Symptomatic cholelithiasis 11/15/2012   Past Medical History:  Diagnosis Date  . Arthritis   . BPH (benign  prostatic hyperplasia)   . Cancer [facility])    Prostate: states he had a positive biopsy but had another one a year later and it was gone.   . Depression   . Dizziness   . GERD (gastroesophageal reflux disease)   . Gout   . High cholesterol   . Hypertension   . Sleep apnea    has cpap - not currently wearing  . Varicose veins     No family history on file.  Past Surgical History:  Procedure Laterality Date  . ANTERIOR CERVICAL DECOMP/DISCECTOMY FUSION N/A 02/21/2018   Procedure: Cervical Five-Six Cervical Six-Seven Anterior cervical decompression/discectomy/fusion;  Surgeon: Ashok Pall, MD;  Location: [place];  Service: Neurosurgery;  Laterality: N/A;  Cervical Five-Six Cervical Six-Seven Anterior cervical decompression/discectomy/fusion  . BLADDER SURGERY    . CARPAL TUNNEL RELEASE Right 02/11/2019   Procedure: Right Carpal Tunnel Wound Irrigation;  Surgeon: Ashok Pall, MD;  Location: [place];  Service: Neurosurgery;  Laterality: Right;  Right Carpal tunnel wound exploration/wash out  . CHOLECYSTECTOMY  11/22/2012  . CHOLECYSTECTOMY  11/22/2012   Procedure: LAPAROSCOPIC CHOLECYSTECTOMY;  Surgeon: Gwenyth Ober, MD;  Location: [place];  Service: General;;  . COLONOSCOPY     Social History   Occupational History  . Not on file  Tobacco Use  . Smoking status: Former Smoker    Quit date: 04/22/1998  Years since quitting: 21.4  . Smokeless tobacco: Never Used  Vaping Use  . Vaping Use: Never used  Substance and Sexual Activity  . Alcohol use: No  . Drug use: No  . Sexual activity: Not on file

## 2019-09-17 LAB — RHEUMATOID FACTOR: Rheumatoid fact SerPl-aCnc: 140 IU/mL — ABNORMAL HIGH (ref <14)

## 2019-09-17 LAB — URIC ACID: Uric Acid, Serum: 5.6 mg/dL (ref 4.0–8.0)

## 2019-09-17 LAB — SEDIMENTATION RATE: Sed Rate: >130 mm/h — ABNORMAL HIGH (ref 0–20)

## 2019-09-17 LAB — ANA: Anti Nuclear Antibody (ANA): NEGATIVE

## 2019-09-17 NOTE — Progress Notes (Signed)
Please let him know that labwork is concerning of RA.  Please refer to deveshwar.  Thanks.

## 2019-09-18 ENCOUNTER — Other Ambulatory Visit: Payer: Self-pay

## 2019-09-18 DIAGNOSIS — M659 Synovitis and tenosynovitis, unspecified: Secondary | ICD-10-CM

## 2019-09-25 ENCOUNTER — Telehealth: Payer: Self-pay

## 2019-09-25 ENCOUNTER — Other Ambulatory Visit: Payer: Self-pay

## 2019-09-25 DIAGNOSIS — R768 Other specified abnormal immunological findings in serum: Secondary | ICD-10-CM

## 2019-09-25 NOTE — Telephone Encounter (Signed)
His BMI is currently 46.5 and not a surgical candidate.  He will need to lose weight and he would benefit from seeing Deveshwar first to get on the appropriate treatments.

## 2019-09-25 NOTE — Telephone Encounter (Signed)
Would like to proceed with Surgery. Does he need to come in?

## 2019-09-25 NOTE — Telephone Encounter (Signed)
Patient would like to proceed with SU. Explained to him the referral for Rheumatology was for RA. He understands and would like to proceed with referral.

## 2019-09-26 NOTE — Telephone Encounter (Signed)
IC s/w patient at length and advised.  Patient seemed very upset when advised of below per Dr Erlinda Hong.  He is requesting a call from Dr Erlinda Hong personally to discuss this. He states he has been messing around with [place] for 3 months and if he would have known this he could sought treatment elsewhere. I advised patient I would pass information along to you.

## 2019-09-26 NOTE — Telephone Encounter (Signed)
Please schedule for left knee scope synovectomy, chondroplasty at [facility]  [id number].  General.  ASAP>  thanks.

## 2019-09-29 ENCOUNTER — Other Ambulatory Visit: Payer: Self-pay | Admitting: Internal Medicine

## 2019-09-29 DIAGNOSIS — I739 Peripheral vascular disease, unspecified: Secondary | ICD-10-CM

## 2019-10-01 NOTE — Telephone Encounter (Signed)
Spoke with patient about scheduling surgery.  We discussed Dr. Delfina Redwood ordering an u/s for his leg and wanting him to wait on surgery.  Patient doesn't want to wait.  I told him I would call Dr. Lina Sar office to check.  I think we should follow Dr. Lina Sar instruction.  I called Dr. Lina Sar office and left voice mail to inquire about waiting to schedule surgery.

## 2019-10-06 ENCOUNTER — Ambulatory Visit: Payer: Medicare Other | Attending: Internal Medicine

## 2019-10-06 DIAGNOSIS — Z20822 Contact with and (suspected) exposure to covid-19: Secondary | ICD-10-CM

## 2019-10-07 LAB — SARS-COV-2, NAA 2 DAY TAT

## 2019-10-07 LAB — NOVEL CORONAVIRUS, NAA: SARS-CoV-2, NAA: NOT DETECTED

## 2019-10-08 NOTE — H&P (Signed)
History of Present Illness  General:          69 year old male, new patient, is referred for over the by Dr. Delfina Redwood to evaluate iron deficiency anemia and Hemoccult-positive stool. Recent Hemoccult cards were positive x3. Lab work done 08/07/19 showed hemoglobin 10.9, iron 41, iron saturation 17%, ferritin 628.        Patient admits to seeing intermittent mild bright red blood on the tissue after bowel movements. He has mild constipation with occasional small hard stools and straining. He has noticed change in bowel habits with smaller stools. He admits to bilateral lower abdominal cramping 20 or 30 minutes after eating. Denies upper abdominal pain. He admits to having episode of black stool one month ago. Family history significant for maternal cousin who had colon cancer.        Medical history is significant for morbid obesity, BMI 43, obstructive sleep apnea. He is having severe left knee pain and is scheduled for left knee MRI tomorrow, followed by orthopedic. He is walking on crutches. He has limited mobility due to knee pain and obesity. He is requesting to schedule a colonoscopy and EGD in the hospital for safety reasons. He was told he stopped breathing during his last EGD and colonoscopy procedures. Cardiac evaluation several years ago was unrevealing.        Patient had normal EGD and colonoscopy done 02/2009 by Dr. Collene Mares. He is overdue for a 10 year repeat screening colonoscopy.    Current Medications  TakingFinasteride 5 MG Tablet 1 tablet Orally Once a day, Notes: urology    Vitamin A & D - Ointment 1 application as needed for dry skin Externally once a day, Notes: Listed in Epic    MVI 1 tab Oral    Ferrous Sulfate 325 (65 Fe) MG Tablet Delayed Release 1 tablet Orally Once a day    DiscontinuedHydrocodone-Acetaminophen 5-325 MG Tablet (Schedule II Drug) TAKE 1 TABLET BY MOUTH EVERY 6 HOURS AS NEEDED FOR PAIN Oral    Amlodipine Besy-Benazepril HCl 5-10 MG Capsule as directed Orally     Flomax(Tamsulosin HCl) 0.4 MG Capsule 1 capsule 30 minutes after the same meal each day Orally Once a day    Tylenol 8 Hour Arthritis Pain(Acetaminophen ER) 650 MG Tablet Extended Release 2 tablets as needed Orally as needed    Medication List reviewed and reconciled with the patient          Past Medical History       Hypertension, benign.        Dyslipidemia per patient myalgia with zocor and elevated lft with lipitor.        Obesity.        Colon polyp.        Esophageal reflux resolved.        Sleep apnea non compliant with cpap.        Depression off meds since 10/2011.        BPH.        Prostate cancer Dr Comer Locket in [place]. Current tx watchful waiting.        Cervical myelopathy Dr Cyndy Freeze.        OA b/l knee.       Surgical History  patient believes he had his bladder stretched as a child   cholecystectomy 2014  prostate biopsy   anterior cervical decompression and fusion- Dr. Christella Noa 02/2018  right carpal tunnel wound irrigation and elbow - Dr. Jearld Pies by treatment of infection 02/2019  left carpal tunnel  and elbow- Dr Christella Noa 03/2019  Colonoscopy/EGD-recommendations 10 yr repeat 2008/2009      Family History  Father: deceased, ESRD, diabetes mellitus, hypertension, peripheral vascular disease, diagnosed with Diabetes, Hypertension  Mother: alive 78 yrs, diabetes mellitus, hypertension, cancer, breast, cancer, thyroid, Dementia, Diabetes  Brother 1: deceased, killed in fire  Brother2: alive  Sister 1: alive, hypertension  2 brother(s) , 1 sister(s) . 1 son(s) , 1 daughter(s) - healthy.   neg fam colon cancer, polyps, liver disease Dad bad kidney.      Social History  General:   Tobacco use       cigarettes:  Former smoker     Quit in year  quit 21+ yrs ago Jan 10,2000     Tobacco history last updated  08/27/2019     Additional Findings: Tobacco Non-User  Ex-cigarette smoker     Vaping  No no EXPOSURE TO PASSIVE SMOKE, quit.  no  Alcohol.  Caffeine: yes.  no Recreational drug use.     Allergies  Zocor  Lipitor      Hospitalization/Major Diagnostic Procedure  as a child   infection from surgery 02/2019      Review of Systems  GI PROCEDURE:          no Pacemaker/ AICD, no.  no Artificial heart valves.  no MI/heart attack.  no Abnormal heart rhythm.  no Angina.  no CVA.  Hypertension  YES, no meds-controlled.  no Hypotension.  no Asthma, COPD.  Sleep apnea  YES, no CPAP.  no Seizure disorders.  no Artificial joints.  no Severe DJD.  no Diabetes.  Significant headaches  YES, Teenage yrs.  no Vertigo.  no Depression/anxiety.  Abnormal bleeding  YES, rectal.  no Kidney Disease.  no Liver disease, no.  no Blood transfusion.            Vital Signs  Wt 272, Wt change -3 lb, Ht 67, BMI 42.60, Temp 97.2, Pulse sitting 113, BP sitting 115/75.    Examination  Gastroenterology Exam:        GENERAL APPEARANCE: Well developed, well nourished, morbidly obese, no active distress, pleasant, no acute distress; He is walking with crutches and has difficulty walking due to knee pain. He is not able to get onto the exam table.Marland Kitchen         SCLERA: anicteric.         RESPIRATORY Breath sounds clear to auscultation. No wheezes, rales or rhonchi. Respiration even and unlabored.         CARDIOVASCULAR Normal RRR w/o murmers or gallops. No peripheral edema.         ABDOMEN No masses palpated. Liver and spleen not palpated, normal. Bowel sounds normal, Abdomen soft and very obese; No abdominal tenderness.Marland Kitchen         PSYCHIATRIC Alert and oriented x3, mood and affect appear normal..      Assessments    1. Iron deficiency anemia - D50.9 (Primary)  2. Heme positive stool - R19.5  3. Morbid obesity - E66.01, Continue lifestyle modification  4. Sleep apnea, unspecified - G47.30, Patient still remains intermittently compliant with CPAP    Treatment  1. Iron deficiency anemia        LAB: CBC with Diff      LAB: Iron Panel   2. Heme  positive stool        IMAGING: Colonoscopy      IMAGING: EGD Esophagogastroduodenoscopy Notes: I discussed risks and benefits of EGD and colonoscopy procedures to  include risk of bleeding or perforation. Patient expressed understanding and agrees to proceed with procedures.  Do Procedures In Hospital due to Obesity and Per Patient Request.            Labs         Lab: CBC with Diff   Abnormal: Hgb 10.4        Value Reference Range       WBC 11.7 H 4.0-11.0 - K/ul       RBC 3.73 L 4.20-5.80 - M/uL       HGB 10.4 L 13.0-17.0 - g/dL       HCT 31.3 L 39.0-52.0 - %       MCV 84.1  80.0-94.0 - fL       MCH 27.9  27.0-33.0 - pg       MCHC 33.1  32.0-36.0 - g/dL       RDW 18.2 H 11.5-15.5 - %       PLT 403 H 150-400 - K/uL       MPV 8.3  7.5-10.7 - fL       NRBC# 0.01  -        NEUT % 73.8 H 43.3-71.9 - %       NRBC% 0.00  - %       LYMPH% 18.4  16.8-43.5 - %       MONO % 5.7  4.6-12.4 - %       EOS % 1.8  0.0-7.8 - %       BASO % 0.3  0.0-1.0 - %       NEUT # 8.6 H 1.9-7.2 - K/uL       LYMPH# 2.10  1.10-2.70 - K/uL       MONO # 0.7  0.3-0.8 - K/uL       EOS # 0.2  0.0-0.6 - K/uL       BASO # 0.0  0.0-0.1 - K/uL                   Lab: Iron Panel   Low        Value Reference Range       IRON BINDING CAPACITY, TOTAL 250  250-450 - ug/dL       IRON 32 L 50-212 - ug/dL       IRON SATURATION 13 L 20-55 - %       TRANSFERRIN 179 L 203-362 - mg/dL

## 2019-10-09 ENCOUNTER — Ambulatory Visit (HOSPITAL_COMMUNITY): Payer: Medicare Other | Admitting: Certified Registered Nurse Anesthetist

## 2019-10-09 ENCOUNTER — Encounter (HOSPITAL_COMMUNITY)
Admission: RE | Disposition: A | Discharge status: Home / Self Care | Payer: Self-pay | Source: Home / Self Care | Attending: Gastroenterology

## 2019-10-09 ENCOUNTER — Other Ambulatory Visit: Payer: Self-pay

## 2019-10-09 ENCOUNTER — Encounter (HOSPITAL_COMMUNITY): Payer: Self-pay | Admitting: Gastroenterology

## 2019-10-09 ENCOUNTER — Ambulatory Visit (HOSPITAL_COMMUNITY)
Admission: RE | Admit: 2019-10-09 | Discharge: 2019-10-09 | Disposition: A | Discharge status: Home / Self Care | Payer: Medicare Other | Attending: Gastroenterology | Admitting: Gastroenterology

## 2019-10-09 DIAGNOSIS — K648 Other hemorrhoids: Secondary | ICD-10-CM | POA: Diagnosis not present

## 2019-10-09 DIAGNOSIS — Z8249 Family history of ischemic heart disease and other diseases of the circulatory system: Secondary | ICD-10-CM | POA: Diagnosis not present

## 2019-10-09 DIAGNOSIS — D509 Iron deficiency anemia, unspecified: Secondary | ICD-10-CM | POA: Diagnosis not present

## 2019-10-09 DIAGNOSIS — I10 Essential (primary) hypertension: Secondary | ICD-10-CM | POA: Diagnosis not present

## 2019-10-09 DIAGNOSIS — Z6841 Body Mass Index (BMI) 40.0 and over, adult: Secondary | ICD-10-CM | POA: Diagnosis not present

## 2019-10-09 DIAGNOSIS — K5900 Constipation, unspecified: Secondary | ICD-10-CM | POA: Insufficient documentation

## 2019-10-09 DIAGNOSIS — Z79899 Other long term (current) drug therapy: Secondary | ICD-10-CM | POA: Insufficient documentation

## 2019-10-09 DIAGNOSIS — N400 Enlarged prostate without lower urinary tract symptoms: Secondary | ICD-10-CM | POA: Insufficient documentation

## 2019-10-09 DIAGNOSIS — G4733 Obstructive sleep apnea (adult) (pediatric): Secondary | ICD-10-CM | POA: Insufficient documentation

## 2019-10-09 DIAGNOSIS — K2950 Unspecified chronic gastritis without bleeding: Secondary | ICD-10-CM | POA: Diagnosis not present

## 2019-10-09 DIAGNOSIS — K621 Rectal polyp: Secondary | ICD-10-CM | POA: Insufficient documentation

## 2019-10-09 DIAGNOSIS — Z8719 Personal history of other diseases of the digestive system: Secondary | ICD-10-CM | POA: Diagnosis not present

## 2019-10-09 DIAGNOSIS — Z8546 Personal history of malignant neoplasm of prostate: Secondary | ICD-10-CM | POA: Diagnosis not present

## 2019-10-09 DIAGNOSIS — Z9049 Acquired absence of other specified parts of digestive tract: Secondary | ICD-10-CM | POA: Diagnosis not present

## 2019-10-09 DIAGNOSIS — K222 Esophageal obstruction: Secondary | ICD-10-CM | POA: Insufficient documentation

## 2019-10-09 DIAGNOSIS — E785 Hyperlipidemia, unspecified: Secondary | ICD-10-CM | POA: Diagnosis not present

## 2019-10-09 DIAGNOSIS — K449 Diaphragmatic hernia without obstruction or gangrene: Secondary | ICD-10-CM | POA: Diagnosis not present

## 2019-10-09 DIAGNOSIS — Z87891 Personal history of nicotine dependence: Secondary | ICD-10-CM | POA: Diagnosis not present

## 2019-10-09 DIAGNOSIS — R195 Other fecal abnormalities: Secondary | ICD-10-CM | POA: Diagnosis present

## 2019-10-09 HISTORY — PX: COLONOSCOPY WITH PROPOFOL: SHX5780

## 2019-10-09 HISTORY — PX: BIOPSY: SHX5522

## 2019-10-09 HISTORY — PX: POLYPECTOMY: SHX5525

## 2019-10-09 HISTORY — PX: ESOPHAGOGASTRODUODENOSCOPY (EGD) WITH PROPOFOL: SHX5813

## 2019-10-09 SURGERY — COLONOSCOPY WITH PROPOFOL
Anesthesia: Monitor Anesthesia Care

## 2019-10-09 MED ORDER — LACTATED RINGERS IV SOLN: INTRAVENOUS | Status: DC

## 2019-10-09 MED ORDER — SODIUM CHLORIDE 0.9 % IV SOLN: INTRAVENOUS | Status: DC

## 2019-10-09 MED ORDER — PROPOFOL 10 MG/ML IV BOLUS
INTRAVENOUS | Status: AC
  Filled 2019-10-09: qty 20

## 2019-10-09 MED ORDER — PROPOFOL 500 MG/50ML IV EMUL
INTRAVENOUS | Status: DC | PRN
  Administered 2019-10-09: 100 ug/kg/min via INTRAVENOUS

## 2019-10-09 MED ORDER — PROPOFOL 10 MG/ML IV BOLUS
INTRAVENOUS | Status: DC | PRN
  Administered 2019-10-09: 10 mg via INTRAVENOUS
  Administered 2019-10-09: 20 mg via INTRAVENOUS
  Administered 2019-10-09: 10 mg via INTRAVENOUS
  Administered 2019-10-09: 20 mg via INTRAVENOUS
  Administered 2019-10-09: 10 mg via INTRAVENOUS

## 2019-10-09 MED ORDER — PROPOFOL 500 MG/50ML IV EMUL
INTRAVENOUS | Status: AC
  Filled 2019-10-09: qty 50

## 2019-10-09 MED ORDER — LIDOCAINE 2% (20 MG/ML) 5 ML SYRINGE
INTRAMUSCULAR | Status: DC | PRN
  Administered 2019-10-09: 100 mg via INTRAVENOUS

## 2019-10-09 SURGICAL SUPPLY — 24 items

## 2019-10-09 NOTE — Op Note (Signed)
[facility] Patient Name: Eric Cruz Procedure Date: 10/09/2019 MRN: [id number] Attending MD: Ronnette Juniper , MD Date of Birth: 1950-12-07 CSN: [id number] Age: 69 Admit Type: Outpatient Procedure:                Colonoscopy Indications:              Last colonoscopy: 2010, Heme positive stool,                            Unexplained iron deficiency anemia Providers:                Ronnette Juniper, MD, Cleda Daub, RN, Theodora Blow,                            Technician Referring MD:             Seward Carol, MD Medicines:                Monitored Anesthesia Care Complications:            No immediate complications. Estimated blood loss:                            Minimal. Estimated Blood Loss:     Estimated blood loss was minimal. Procedure:                Pre-Anesthesia Assessment:                           - Prior to the procedure, a History and Physical                            was performed, and patient medications and                            allergies were reviewed. The patient's tolerance of                            previous anesthesia was also reviewed. The risks                            and benefits of the procedure and the sedation                            options and risks were discussed with the patient.                            All questions were answered, and informed consent                            was obtained. Prior Anticoagulants: The patient has                            taken no previous anticoagulant or antiplatelet                            agents. ASA Grade Assessment:  III - A patient with                            severe systemic disease. After reviewing the risks                            and benefits, the patient was deemed in                            satisfactory condition to undergo the procedure.                           After obtaining informed consent, the colonoscope                            was passed under direct  vision. Throughout the                            procedure, the patient's blood pressure, pulse, and                            oxygen saturations were monitored continuously. The                            [id number]-H190DL ([id number] Olympus pediatric colonscope                            was introduced through the anus and advanced to the                            the terminal ileum. The colonoscopy was performed                            without difficulty. The patient tolerated the                            procedure well. The quality of the bowel                            preparation was adequate to identify polyps 6 mm                            and larger in size. Scope In: 9:14:02 AM Scope Out: 9:29:55 AM Scope Withdrawal Time: 0 hours 11 minutes 13 seconds  Total Procedure Duration: 0 hours 15 minutes 53 seconds  Findings:      The perianal and digital rectal examinations were normal.      The terminal ileum appeared normal.      A 6 mm polyp was found in the rectum. The polyp was sessile. The polyp       was removed with a cold snare. Resection and retrieval were complete.      Non-bleeding internal hemorrhoids were found during retroflexion. The       hemorrhoids were medium-sized.      The exam was otherwise without abnormality. Impression:               -  The examined portion of the ileum was normal.                           - One 6 mm polyp in the rectum, removed with a cold                            snare. Resected and retrieved.                           - Non-bleeding internal hemorrhoids.                           - The examination was otherwise normal. Moderate Sedation:      Patient did not receive moderate sedation for this procedure, but       instead received monitored anesthesia care. Recommendation:           - Patient has a contact number available for                            emergencies. The signs and symptoms of potential                             delayed complications were discussed with the                            patient. Return to normal activities tomorrow.                            Written discharge instructions were provided to the                            patient.                           - Resume regular diet.                           - Continue present medications.                           - Await pathology results.                           - Repeat colonoscopy for surveillance based on                            pathology results.                           - Consider Small bowel pillcam for further                            evaluation of IDA. Procedure Code(s):        --- Professional ---  45385, Colonoscopy, flexible; with removal of                            tumor(s), polyp(s), or other lesion(s) by snare                            technique Diagnosis Code(s):        --- Professional ---                           K64.8, Other hemorrhoids                           K62.1, Rectal polyp                           R19.5, Other fecal abnormalities                           D50.9, Iron deficiency anemia, unspecified CPT copyright 2019 American Medical Association. All rights reserved. The codes documented in this report are preliminary and upon coder review may  be revised to meet current compliance requirements. Ronnette Juniper, MD 10/09/2019 9:41:27 AM This report has been signed electronically. Number of Addenda: 0

## 2019-10-09 NOTE — Transfer of Care (Signed)
Immediate Anesthesia Transfer of Care Note  Patient: Eric Cruz  Procedure(s) Performed: COLONOSCOPY WITH PROPOFOL (N/A ) ESOPHAGOGASTRODUODENOSCOPY (EGD) WITH PROPOFOL (N/A ) BIOPSY POLYPECTOMY  Patient Location: Endoscopy Unit  Anesthesia Type:MAC  Level of Consciousness: drowsy and patient cooperative  Airway & Oxygen Therapy: Patient Spontanous Breathing  Post-op Assessment: Report given to RN and Post -op Vital signs reviewed and stable  Post vital signs: Reviewed and stable  Last Vitals:  Vitals Value Taken Time  BP    Temp    Pulse 79 10/09/19 0938  Resp 17 10/09/19 0938  SpO2 96 % 10/09/19 0938  Vitals shown include unvalidated device data.  Last Pain:  Vitals:   10/09/19 0820  TempSrc: Oral  PainSc: 8          Complications: No complications documented.

## 2019-10-09 NOTE — Anesthesia Postprocedure Evaluation (Signed)
Anesthesia Post Note  Patient: Eric Cruz  Procedure(s) Performed: COLONOSCOPY WITH PROPOFOL (N/A ) ESOPHAGOGASTRODUODENOSCOPY (EGD) WITH PROPOFOL (N/A ) BIOPSY POLYPECTOMY     Patient location during evaluation: PACU Anesthesia Type: MAC Level of consciousness: awake and alert Pain management: pain level controlled Vital Signs Assessment: post-procedure vital signs reviewed and stable Respiratory status: spontaneous breathing, nonlabored ventilation, respiratory function stable and patient connected to nasal cannula oxygen Cardiovascular status: stable and blood pressure returned to baseline Postop Assessment: no apparent nausea or vomiting Anesthetic complications: no   No complications documented.  Last Vitals:  Vitals:   10/09/19 1000 10/09/19 1010  BP: 133/75 (!) 156/81  Pulse: 69 (!) 58  Resp: 19 20  Temp:    SpO2: 97% 97%    Last Pain:  Vitals:   10/09/19 1000  TempSrc:   PainSc: 0-No pain                 Oz Gammel DAVID

## 2019-10-09 NOTE — Discharge Instructions (Signed)

## 2019-10-09 NOTE — Brief Op Note (Signed)
10/09/2019  9:31 AM  PATIENT:  Delsa Sale  69 y.o. male  PRE-OPERATIVE DIAGNOSIS:  Heme positive stool - R19.5, Morbid obesity - E66.01, Sleep apnea, unspecified - G47.30  POST-OPERATIVE DIAGNOSIS:  gastric nodule  PROCEDURE:  Procedure(s): COLONOSCOPY WITH PROPOFOL (N/A) ESOPHAGOGASTRODUODENOSCOPY (EGD) WITH PROPOFOL (N/A) BIOPSY POLYPECTOMY  SURGEON:  Surgeon(s) and Role:    Ronnette Juniper, MD - Primary  PHYSICIAN ASSISTANT:   ASSISTANTS:Patricia Marijean Bravo, RN, Theodora Blow, Tech  ANESTHESIA:   MAC  EBL:  Minimal  BLOOD ADMINISTERED:none  DRAINS: none   LOCAL MEDICATIONS USED:  NONE  SPECIMEN:  Biopsy / Limited Resection  DISPOSITION OF SPECIMEN:  PATHOLOGY  COUNTS:  YES  TOURNIQUET:  * No tourniquets in log *  DICTATION: .Dragon Dictation  PLAN OF CARE: Discharge to home after PACU  PATIENT DISPOSITION:  PACU - hemodynamically stable.   Delay start of Pharmacological VTE agent (>24hrs) due to surgical blood loss or risk of bleeding: not applicable

## 2019-10-09 NOTE — Interval H&P Note (Signed)
History and Physical Interval Note: 68/male with IDA, FOBT positive stools, BRB on wiping, last colonoscopy in 2010 for an EGD and colonoscopy today.  10/09/2019 8:25 AM  Delsa Sale  has presented today for EGD and colonoscopy, with the diagnosis of Heme positive stool - R19.5, Morbid obesity - E66.01, Sleep apnea, unspecified - G47.30.  The various methods of treatment have been discussed with the patient and family. After consideration of risks, benefits and other options for treatment, the patient has consented to  Procedure(s): COLONOSCOPY WITH PROPOFOL (N/A) ESOPHAGOGASTRODUODENOSCOPY (EGD) WITH PROPOFOL (N/A) as a surgical intervention.  The patient's history has been reviewed, patient examined, no change in status, stable for surgery.  I have reviewed the patient's chart and labs.  Questions were answered to the patient's satisfaction.     Ronnette Juniper

## 2019-10-09 NOTE — Anesthesia Preprocedure Evaluation (Signed)
Anesthesia Evaluation  Patient identified by MRN, date of birth, ID band Patient awake    Reviewed: Allergy & Precautions, NPO status , Patient's Chart, lab work & pertinent test results  Airway Mallampati: I  TM Distance: >3 FB Neck ROM: Full    Dental   Pulmonary sleep apnea , former smoker,    Pulmonary exam normal        Cardiovascular hypertension, Pt. on medications Normal cardiovascular exam     Neuro/Psych Depression    GI/Hepatic GERD  Medicated and Controlled,  Endo/Other    Renal/GU      Musculoskeletal   Abdominal   Peds  Hematology   Anesthesia Other Findings   Reproductive/Obstetrics                             Anesthesia Physical Anesthesia Plan  ASA: III  Anesthesia Plan: MAC   Post-op Pain Management:    Induction: Intravenous  PONV Risk Score and Plan: 1 and Treatment may vary due to age or medical condition  Airway Management Planned: Nasal Cannula  Additional Equipment:   Intra-op Plan:   Post-operative Plan:   Informed Consent: I have reviewed the patients History and Physical, chart, labs and discussed the procedure including the risks, benefits and alternatives for the proposed anesthesia with the patient or authorized representative who has indicated his/her understanding and acceptance.       Plan Discussed with: CRNA and Surgeon  Anesthesia Plan Comments:         Anesthesia Quick Evaluation

## 2019-10-09 NOTE — Op Note (Signed)
[facility] Patient Name: Eric Cruz Procedure Date: 10/09/2019 MRN: [id number] Attending MD: Ronnette Juniper , MD Date of Birth: [birth year] CSN: [id number] Age: 69 Admit Type: Outpatient Procedure:                Upper GI endoscopy Indications:              Unexplained iron deficiency anemia, Occult blood in                            stool Providers:                Ronnette Juniper, MD, Cleda Daub, RN, Theodora Blow,                            Technician Referring MD:             Seward Carol, MD Medicines:                Monitored Anesthesia Care Complications:            No immediate complications. Estimated blood loss:                            Minimal. Estimated Blood Loss:     Estimated blood loss was minimal. Procedure:                Pre-Anesthesia Assessment:                           - Prior to the procedure, a History and Physical                            was performed, and patient medications and                            allergies were reviewed. The patient's tolerance of                            previous anesthesia was also reviewed. The risks                            and benefits of the procedure and the sedation                            options and risks were discussed with the patient.                            All questions were answered, and informed consent                            was obtained. Prior Anticoagulants: The patient has                            taken no previous anticoagulant or antiplatelet                            agents. ASA Grade Assessment:  III - A patient with                            severe systemic disease. After reviewing the risks                            and benefits, the patient was deemed in                            satisfactory condition to undergo the procedure.                           After obtaining informed consent, the endoscope was                            passed under direct vision.  Throughout the                            procedure, the patient's blood pressure, pulse, and                            oxygen saturations were monitored continuously. The                            [id number]-[id number] ([id number] Olympus gastroscope was                            introduced through the mouth, and advanced to the                            second part of duodenum. The upper GI endoscopy was                            accomplished without difficulty. The patient                            tolerated the procedure well. Scope In: Scope Out: Findings:      The upper third of the esophagus, middle third of the esophagus and       lower third of the esophagus were normal.      A widely patent Schatzki ring was found at the gastroesophageal junction.      A small hiatal hernia was present.      A single 5 mm submucosal papule (nodule) with no stigmata of recent       bleeding was found in the gastric antrum. Biopsies were taken with a       cold forceps for histology.      The cardia and gastric fundus were normal on retroflexion.      The examined duodenum was normal. Biopsies for histology were taken with       a cold forceps for evaluation of celiac disease. Impression:               - Normal upper third of esophagus, middle third of  esophagus and lower third of esophagus.                           - Widely patent Schatzki ring.                           - Small hiatal hernia.                           - A single submucosal papule (nodule) found in the                            stomach. Biopsied.                           - Normal examined duodenum. Biopsied. Moderate Sedation:      Patient did not receive moderate sedation for this procedure, but       instead received monitored anesthesia care. Recommendation:           - Patient has a contact number available for                            emergencies. The signs and symptoms of potential                             delayed complications were discussed with the                            patient. Return to normal activities tomorrow.                            Written discharge instructions were provided to the                            patient.                           - Resume regular diet.                           - Perform a colonoscopy today.                           - Await pathology results.                           - Continue present medications. Procedure Code(s):        --- Professional ---                           [phone], Esophagogastroduodenoscopy, flexible,                            transoral; with biopsy, single or multiple Diagnosis Code(s):        --- Professional ---  K22.2, Esophageal obstruction                           K44.9, Diaphragmatic hernia without obstruction or                            gangrene                           K31.89, Other diseases of stomach and duodenum                           D50.9, Iron deficiency anemia, unspecified                           R19.5, Other fecal abnormalities CPT copyright 2019 American Medical Association. All rights reserved. The codes documented in this report are preliminary and upon coder review may  be revised to meet current compliance requirements. Ronnette Juniper, MD 10/09/2019 9:38:38 AM This report has been signed electronically. Number of Addenda: 0

## 2019-10-10 ENCOUNTER — Encounter (HOSPITAL_COMMUNITY): Payer: Self-pay | Admitting: Gastroenterology

## 2019-10-10 LAB — SURGICAL PATHOLOGY

## 2019-10-13 ENCOUNTER — Ambulatory Visit
Admission: RE | Admit: 2019-10-13 | Discharge: 2019-10-13 | Disposition: A | Discharge status: Home / Self Care | Payer: Medicare Other | Source: Ambulatory Visit | Attending: Internal Medicine | Admitting: Internal Medicine

## 2019-10-13 DIAGNOSIS — I739 Peripheral vascular disease, unspecified: Secondary | ICD-10-CM

## 2019-10-21 ENCOUNTER — Other Ambulatory Visit: Payer: Self-pay | Admitting: Orthopaedic Surgery

## 2019-10-29 ENCOUNTER — Telehealth: Payer: Self-pay | Admitting: Orthopedic Surgery

## 2019-10-29 NOTE — Telephone Encounter (Signed)
Ok send him in

## 2019-10-29 NOTE — Telephone Encounter (Signed)
Would you please review this patient's chart?  He has been seeing Dr Erlinda Hong in [place] but requests an appointment with you to take over his care.  He has had many family members see you and they  have spoken very highly of you  He has had fluid removed from the knee, xrays and an MRI.  Please advise if you will see this patient.  Thanks

## 2019-11-03 NOTE — Telephone Encounter (Signed)
10/31/19  I called and left a message for this patient to call the office to schedule an appointment.  11/03/19  I called Eric Cruz at 10:00 this morning and again at 2:00 this afternoon asking him to call the office to schedule an appointment.

## 2019-11-04 ENCOUNTER — Encounter: Payer: Self-pay | Admitting: Orthopedic Surgery

## 2019-11-05 NOTE — Telephone Encounter (Signed)
Patient called back and we have scheduled him per his and his wife's schedule for Tuesday, 12/02/19 at 3:20.  New patient paperwork has been mailed to him.

## 2019-11-06 NOTE — Telephone Encounter (Signed)
We had a opportunity to have Eric Cruz come in on Monday, 11/24/19 so I called him and he is going to come in on Monday, 11/24/19 at 3:20

## 2019-11-24 ENCOUNTER — Ambulatory Visit (INDEPENDENT_AMBULATORY_CARE_PROVIDER_SITE_OTHER): Payer: Medicare Other | Admitting: Orthopedic Surgery

## 2019-11-24 ENCOUNTER — Encounter: Payer: Self-pay | Admitting: Orthopedic Surgery

## 2019-11-24 ENCOUNTER — Other Ambulatory Visit: Payer: Self-pay

## 2019-11-24 VITALS — BP 190/110 | HR 103 | Ht 68.0 in | Wt 271.0 lb

## 2019-11-24 DIAGNOSIS — M069 Rheumatoid arthritis, unspecified: Secondary | ICD-10-CM | POA: Diagnosis not present

## 2019-11-24 DIAGNOSIS — M25562 Pain in left knee: Secondary | ICD-10-CM

## 2019-11-24 DIAGNOSIS — G8929 Other chronic pain: Secondary | ICD-10-CM | POA: Diagnosis not present

## 2019-11-24 MED ORDER — PREDNISONE 10 MG PO TABS: 10.0000 mg | ORAL_TABLET | Freq: Every day | ORAL | 0 refills | Status: DC

## 2019-11-24 MED ORDER — TRAMADOL HCL 50 MG PO TABS: ORAL_TABLET | ORAL | 0 refills | Status: DC

## 2019-11-24 NOTE — Patient Instructions (Signed)
Presumptive diagnosis is rheumatoid arthritis  I am going to start you on prednisone 10 mg a day we can increase it to 3 times a day if we have to but start on the lowest dose to control the synovitis or inflammation in your knee you also have some tramadol that we have reordered for pain  Call us for refills  Schedule appointment for January

## 2019-11-24 NOTE — Progress Notes (Signed)
NEW PROBLEM//OFFICE VISIT  Chief Complaint  Patient presents with  . Knee Pain    left knee pain, comes and goes, some swelling, he was told he has RA.     69 year old male presents for evaluation of his left knee.  Patient complains of severe pain especially at night.  Patient had a work-up including aspiration of the left knee which showed probable rheumatoid arthritis sent for rheumatology referral but not until December  Patient said his pain was too much to wait until that time.  He complains of pain in his left knee medial lateral joint line decreased range of motion intermittent swelling and he is using a cane  He said pain now for over a year  He was previously seen by [facility] [place].  He did have x-ray and MRI MRI showed significant synovitis of the joint  Patient BMI 41   Review of Systems  All other systems reviewed and are negative.    Past Medical History:  Diagnosis Date  . Arthritis   . BPH (benign prostatic hyperplasia)   . Cancer [facility])    Prostate: states he had a positive biopsy but had another one a year later and it was gone.   . Depression   . Dizziness   . GERD (gastroesophageal reflux disease)   . Gout   . High cholesterol   . Hypertension   . Sleep apnea    has cpap - not currently wearing  . Varicose veins     Past Surgical History:  Procedure Laterality Date  . ANTERIOR CERVICAL DECOMP/DISCECTOMY FUSION N/A 02/21/2018   Procedure: Cervical Five-Six Cervical Six-Seven Anterior cervical decompression/discectomy/fusion;  Surgeon: Ashok Pall, MD;  Location: [place];  Service: Neurosurgery;  Laterality: N/A;  Cervical Five-Six Cervical Six-Seven Anterior cervical decompression/discectomy/fusion  . BIOPSY  10/09/2019   Procedure: BIOPSY;  Surgeon: Ronnette Juniper, MD;  Location: [facility];  Service: Gastroenterology;;  . BLADDER SURGERY    . CARPAL TUNNEL RELEASE Right 02/11/2019   Procedure: Right Carpal Tunnel Wound  Irrigation;  Surgeon: Ashok Pall, MD;  Location: [place];  Service: Neurosurgery;  Laterality: Right;  Right Carpal tunnel wound exploration/wash out  . CHOLECYSTECTOMY  11/22/2012  . CHOLECYSTECTOMY  11/22/2012   Procedure: LAPAROSCOPIC CHOLECYSTECTOMY;  Surgeon: Gwenyth Ober, MD;  Location: [place];  Service: General;;  . COLONOSCOPY    . COLONOSCOPY WITH PROPOFOL N/A 10/09/2019   Procedure: COLONOSCOPY WITH PROPOFOL;  Surgeon: Ronnette Juniper, MD;  Location: [facility];  Service: Gastroenterology;  Laterality: N/A;  . ESOPHAGOGASTRODUODENOSCOPY (EGD) WITH PROPOFOL N/A 10/09/2019   Procedure: ESOPHAGOGASTRODUODENOSCOPY (EGD) WITH PROPOFOL;  Surgeon: Ronnette Juniper, MD;  Location: [facility];  Service: Gastroenterology;  Laterality: N/A;  . POLYPECTOMY  10/09/2019   Procedure: POLYPECTOMY;  Surgeon: Ronnette Juniper, MD;  Location: [facility];  Service: Gastroenterology;;    No family history on file. Social History   Tobacco Use  . Smoking status: Former Smoker    Quit date: 04/22/1998    Years since quitting: 21.6  . Smokeless tobacco: Never Used  Vaping Use  . Vaping Use: Never used  Substance Use Topics  . Alcohol use: No  . Drug use: No    Allergies  Allergen Reactions  . Other     Can only tolerate oxygen on a low level  . Statins     Arms ache    Current Meds  Medication Sig  . ascorbic acid (VITAMIN C) 500 MG tablet Take 500  mg by mouth daily.  . ferrous sulfate 325 (65 FE) MG EC tablet Take 325 mg by mouth daily with breakfast.  . finasteride (PROSCAR) 5 MG tablet Take 5 mg by mouth daily.  . Multiple Vitamins-Minerals (MULTIVITAMIN WITH MINERALS) tablet Take 1 tablet by mouth in the morning and at bedtime.  . tamsulosin (FLOMAX) 0.4 MG CAPS capsule Take 0.4 mg by mouth daily after supper.  . traMADol (ULTRAM) 50 MG tablet TAKE 1 TO 2 TABLETS BY MOUTH ONCE DAILY AS NEEDED  . Vitamins A & D (VITAMIN A & D) ointment Apply 1 application topically daily as needed for dry skin.     BP (!) 190/110   Pulse (!) 103   Ht 5\' 8"  (1.727 m)   Wt 271 lb (122.9 kg)   BMI 41.21 kg/m   Physical Exam Constitutional:      General: He is not in acute distress.    Appearance: He is well-developed.  Cardiovascular:     Comments: No peripheral edema Skin:    General: Skin is warm and dry.     Capillary Refill: Capillary refill takes less than 2 seconds.  Neurological:     Mental Status: He is alert and oriented to person, place, and time.     Sensory: No sensory deficit.     Coordination: Coordination normal.     Gait: Gait abnormal.     Deep Tendon Reflexes: Reflexes are normal and symmetric.  Psychiatric:        Mood and Affect: Mood normal.        Thought Content: Thought content normal.     Ortho Exam  Left knee probably an effusion.  Tenderness medial lateral joint line.  He can flex the knee to about 105 degrees not quite full extension otherwise stable meniscal signs negative muscle strength and tone normal.  Skin intact.  MEDICAL DECISION MAKING  A.  Encounter Diagnoses  Name Primary?  . Chronic pain of left knee   . Rheumatoid arthritis involving left knee, unspecified whether rheumatoid factor present ([place]) Yes    B. DATA ANALYSED:   IMAGING: Interpretation of images: Outside x-ray images are reviewed.  X-rays do not show major joint space narrowing.  MRI report  IMPRESSION: 1. Severe inflammatory process involving the knee joint with fluid, edema, inflammatory debris and severe synovitis. Findings could be due to a severe inflammatory arthropathy but septic arthritis would also be a strong consideration. There is also significant associated diffuse marrow edema which is worrisome. Recommend clinical correlation. Patient may require joint aspiration or synovial sampling. 2. Partial thickness radial tear involving the posterior horn of the medial meniscus along with free edge tears along the midbody region. 3. Free edge tears involving  the mid body region of the lateral meniscus. 4. Intact ligamentous structures. Significant thickening and increased signal intensity in the fibular collateral ligament could be due to a grade 1-2 sprain or possibly inflammatory process.  These results will be called to the ordering clinician or representative by the Radiologist Assistant, and communication documented in the PACS or [employer].   Electronically Signed   By: Marijo Sanes M.D.   On: 08/29/2019 12:20 Orders: no  Outside records reviewed: yes Dr Erlinda Hong   C. MANAGEMENT   Medication  Patient education   Meds ordered this encounter  Medications  . predniSONE (DELTASONE) 10 MG tablet    Sig: Take 1 tablet (10 mg total) by mouth daily.    Dispense:  42 tablet  Refill:  0   F/u after Rheumatology visit  Arther Abbott, MD  11/24/2019 3:36 PM

## 2019-12-02 ENCOUNTER — Ambulatory Visit: Payer: Medicare Other | Admitting: Orthopedic Surgery

## 2020-01-15 ENCOUNTER — Other Ambulatory Visit: Payer: Self-pay | Admitting: Orthopedic Surgery

## 2020-01-15 DIAGNOSIS — M25562 Pain in left knee: Secondary | ICD-10-CM

## 2020-01-15 DIAGNOSIS — M069 Rheumatoid arthritis, unspecified: Secondary | ICD-10-CM

## 2020-01-15 DIAGNOSIS — G8929 Other chronic pain: Secondary | ICD-10-CM

## 2020-01-21 ENCOUNTER — Other Ambulatory Visit: Payer: Self-pay | Admitting: Urology

## 2020-01-21 DIAGNOSIS — C61 Malignant neoplasm of prostate: Secondary | ICD-10-CM

## 2020-02-11 ENCOUNTER — Other Ambulatory Visit: Payer: Self-pay | Admitting: Orthopedic Surgery

## 2020-02-11 DIAGNOSIS — G8929 Other chronic pain: Secondary | ICD-10-CM

## 2020-02-11 DIAGNOSIS — M069 Rheumatoid arthritis, unspecified: Secondary | ICD-10-CM

## 2020-02-16 ENCOUNTER — Other Ambulatory Visit: Payer: Self-pay

## 2020-02-16 ENCOUNTER — Ambulatory Visit
Admission: RE | Admit: 2020-02-16 | Discharge: 2020-02-16 | Disposition: A | Discharge status: Home / Self Care | Payer: Medicare Other | Source: Ambulatory Visit | Attending: Urology | Admitting: Urology

## 2020-02-16 DIAGNOSIS — C61 Malignant neoplasm of prostate: Secondary | ICD-10-CM

## 2020-02-16 MED ORDER — GADOBENATE DIMEGLUMINE 529 MG/ML IV SOLN
20.0000 mL | Freq: Once | INTRAVENOUS | Status: AC | PRN
  Administered 2020-02-16: 20 mL via INTRAVENOUS

## 2020-02-20 NOTE — Progress Notes (Signed)
Office Visit Note  Patient: Eric Cruz             Date of Birth: 1950/12/23           MRN: [id number]             PCP: Seward Carol, MD Referring: Leandrew Koyanagi, MD Visit Date: 03/04/2020 Occupation: '@GUAROCC' @  Subjective:  Pain in multiple joints.   History of Present Illness: Eric Cruz is a 69 y.o. male with history of pain in multiple joints and positive rheumatoid factor.  He has been seen in consultation per request of Dr.Xu.  According the patient in 2018 he had C-spine fusion by Dr. Christella Noa.  Soon after that he started having pain and discomfort in multiple joints.  He describes pain and discomfort in his bilateral hands, bilateral wrists, bilateral elbows, bilateral knee joints and his ankles.  He has intermittent swelling in his knees and ankles.  He states he underwent right carpal tunnel release by Dr. Erlinda Hong in November 2020 followed by left carpal tunnel release.  He had infection in his right carpal tunnel and had a repeat surgery.  He states since then he has had several episodes of knee joint swelling and had cortisone injections to his knee joints followed by Visco supplement injections without much relief.  He also had right knee joint aspiration.  The culture was negative.  Also complains of some discomfort in his left hip joint.  He states he has been using handicap devices at home, despite of that he is having difficulty with daily activities and mobility.  There is no family history of rheumatoid arthritis or autoimmune disease.  Activities of Daily Living:  Patient reports morning stiffness for  all day.  Patient Reports nocturnal pain.  Difficulty dressing/grooming: Denies Difficulty climbing stairs: Reports Difficulty getting out of chair: Reports Difficulty using hands for taps, buttons, cutlery, and/or writing: Reports  Review of Systems  Constitutional: Negative for fatigue.  HENT: Negative for mouth sores, mouth dryness and nose dryness.   Eyes:  Negative for pain, itching and dryness.  Respiratory: Negative for shortness of breath and difficulty breathing.   Cardiovascular: Negative for chest pain and palpitations.  Gastrointestinal: Negative for blood in stool, constipation and diarrhea.  Endocrine: Negative for increased urination.  Genitourinary: Positive for involuntary urination. Negative for difficulty urinating.  Musculoskeletal: Positive for arthralgias, joint pain, joint swelling, muscle weakness and morning stiffness. Negative for myalgias, muscle tenderness and myalgias.  Skin: Negative for color change, rash and redness.  Allergic/Immunologic: Negative for susceptible to infections.  Neurological: Negative for dizziness, numbness, headaches, memory loss and weakness.  Hematological: Negative for bruising/bleeding tendency.  Psychiatric/Behavioral: Negative for confusion.    PMFS History:  Patient Active Problem List   Diagnosis Date Noted  . Synovitis of left knee 09/16/2019  . Wound infection after surgery 02/11/2019  . HNP (herniated nucleus pulposus) with myelopathy, cervical 02/21/2018  . Postop check 12/10/2012  . Symptomatic cholelithiasis 11/15/2012    Past Medical History:  Diagnosis Date  . Arthritis   . BPH (benign prostatic hyperplasia)   . [facility])    Prostate: states he had a positive biopsy but had another one a year later and it was gone.   . Depression   . Dizziness   . GERD (gastroesophageal reflux disease)   . Gout   . High cholesterol   . Hypertension   . Sleep apnea    has cpap - not currently wearing  .  Varicose veins     Family History  Problem Relation Age of Onset  . Hypertension Mother   . Diabetes Mother   . Hypertension Father   . Diabetes Father   . Healthy Son   . Healthy Daughter    Past Surgical History:  Procedure Laterality Date  . ANTERIOR CERVICAL DECOMP/DISCECTOMY FUSION N/A 02/21/2018   Procedure: Cervical Five-Six Cervical Six-Seven Anterior cervical  decompression/discectomy/fusion;  Surgeon: Ashok Pall, MD;  Location: [place];  Service: Neurosurgery;  Laterality: N/A;  Cervical Five-Six Cervical Six-Seven Anterior cervical decompression/discectomy/fusion  . BIOPSY  10/09/2019   Procedure: BIOPSY;  Surgeon: Ronnette Juniper, MD;  Location: [facility];  Service: Gastroenterology;;  . BLADDER SURGERY    . CARPAL TUNNEL RELEASE Right 02/11/2019   Procedure: Right Carpal Tunnel Wound Irrigation;  Surgeon: Ashok Pall, MD;  Location: [place];  Service: Neurosurgery;  Laterality: Right;  Right Carpal tunnel wound exploration/wash out  . CARPAL TUNNEL RELEASE Bilateral   . CHOLECYSTECTOMY  11/22/2012  . CHOLECYSTECTOMY  11/22/2012   Procedure: LAPAROSCOPIC CHOLECYSTECTOMY;  Surgeon: Gwenyth Ober, MD;  Location: [place];  Service: General;;  . COLONOSCOPY    . COLONOSCOPY WITH PROPOFOL N/A 10/09/2019   Procedure: COLONOSCOPY WITH PROPOFOL;  Surgeon: Ronnette Juniper, MD;  Location: [facility];  Service: Gastroenterology;  Laterality: N/A;  . ESOPHAGOGASTRODUODENOSCOPY (EGD) WITH PROPOFOL N/A 10/09/2019   Procedure: ESOPHAGOGASTRODUODENOSCOPY (EGD) WITH PROPOFOL;  Surgeon: Ronnette Juniper, MD;  Location: [facility];  Service: Gastroenterology;  Laterality: N/A;  . POLYPECTOMY  10/09/2019   Procedure: POLYPECTOMY;  Surgeon: Ronnette Juniper, MD;  Location: [facility];  Service: Gastroenterology;;   Social History   Social History Narrative  . Not on file    There is no immunization history on file for this patient.   Objective: Vital Signs: BP 109/73 (BP Location: Right Arm, Patient Position: Sitting, Cuff Size: Large)   Pulse 96   Resp 18   Ht '5\' 8"'  (1.727 m)   Wt 281 lb (127.5 kg)   BMI 42.73 kg/m    Physical Exam Vitals and nursing note reviewed.  Constitutional:      Appearance: He is well-developed.  HENT:     Head: Normocephalic and atraumatic.  Eyes:     Conjunctiva/sclera: Conjunctivae normal.     Pupils: Pupils are equal, round, and  reactive to light.  Cardiovascular:     Rate and Rhythm: Normal rate and regular rhythm.     Heart sounds: Normal heart sounds.  Pulmonary:     Effort: Pulmonary effort is normal.     Breath sounds: Normal breath sounds.  Abdominal:     General: Bowel sounds are normal.     Palpations: Abdomen is soft.  Musculoskeletal:     Cervical back: Normal range of motion and neck supple.  Skin:    General: Skin is warm and dry.     Capillary Refill: Capillary refill takes less than 2 seconds.  Neurological:     Mental Status: He is alert and oriented to person, place, and time.  Psychiatric:        Behavior: Behavior normal.      Musculoskeletal Exam: Patient had limited range of motion of his cervical spine.  He has severe pain and discomfort range of motion of his shoulder joints with abduction limited to about 30 degrees.  He had right elbow joint contracture.  He has synovitis over wrist joints as described below.  He had PIP and DIP thickening.  He had painful  range of motion of his left hip joint.  He had discomfort in his bilateral knee joints with swelling over his left knee joint.  He had tenderness over bilateral ankle joints and across his MTP joints.  CDAI Exam: CDAI Score: 10.6  Patient Global: 8 mm; Provider Global: 8 mm Swollen: 5 ; Tender: 16  Joint Exam 03/04/2020      Right  Left  Glenohumeral   Tender   Tender  Elbow  Swollen Tender     Wrist  Swollen Tender   Tender  Cervical Spine   Tender     Hip      Tender  Knee     Swollen Tender  Ankle  Swollen Tender  Swollen Tender  MTP 1   Tender   Tender  MTP 2      Tender  MTP 3   Tender   Tender  MTP 5   Tender        Investigation: No additional findings.  Imaging: MR PROSTATE W WO CONTRAST  Result Date: 02/16/2020 CLINICAL DATA:  prostate cancer.  PSA of 3.7. EXAM: MR PROSTATE WITHOUT AND WITH CONTRAST TECHNIQUE: Multiplanar multisequence MRI images were obtained of the pelvis centered about the prostate.  Pre and post contrast images were obtained. CONTRAST:  10m MULTIHANCE GADOBENATE DIMEGLUMINE 529 MG/ML IV SOLN COMPARISON:  Biopsy resulted 06/09/2017. This describes no evidence of primary malignancy. FINDINGS: Prostate: Demonstrates moderate central gland enlargement and heterogeneity, consistent with benign prostatic hyperplasia. No dominant central gland nodule. Median lobe impression into the urinary bladder. Mild compression of the peripheral zone. No areas of masslike T2 hypointensity, restricted diffusion, or early post-contrast enhancement. Volume: 5.4 x 4.6 x 4.6 cm (volume = 60 cm^3) Transcapsular spread:  Absent Seminal vesicle involvement: Absent Neurovascular bundle involvement: Absent Pelvic adenopathy: Absent Bone metastasis: Absent. Synovial thickening and hyperenhancement about the left hip with subchondral areas of hyperenhancement, including on series 14. Other findings: No significant free fluid. Normal urinary bladder. Aortic atherosclerosis. IMPRESSION: 1. No evidence of high-grade or macroscopic prostate carcinoma. 2. Findings about the left hip which are likely due to osteoarthritis. Electronically Signed   By: KAbigail MiyamotoM.D.   On: 02/16/2020 14:56   XR Foot 2 Views Left  Result Date: 03/04/2020 First MTP, PIP and DIP narrowing was noted.  No significant intertarsal joint space narrowing was noted.  Dorsal spurring was noted.  Subtalar joint space narrowing was noted.  Inferior calcaneal spur was noted. 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  XR Foot 2 Views Right  Result Date: 03/04/2020 First MTP, PIP and DIP narrowing was noted.  No intertarsal, tibiotalar or subtalar joint space narrowing was noted.  Inferior calcaneal spur was noted.  No erosive changes were noted. Impression: These findings are consistent with osteoarthritis of the foot.  XR Hand 2 View Left  Result Date: 03/04/2020 Juxta-articular osteopenia  was noted.  CMC, PIP and DIP narrowing was noted.  Intercarpal joint space narrowing was noted.  No MCP or radiocarpal joint space narrowing was noted. 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  XR Hand 2 View Right  Result Date: 03/04/2020 Juxta-articular osteopenia was noted.  CMC, PIP and DIP narrowing was noted.  No MCP, intercarpal or radiocarpal joint space narrowing was noted.  No erosive changes were noted. 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   Recent Labs: Lab Results  Component Value Date   WBC  10.8 (H) 02/11/2019   HGB 9.2 (L) 02/11/2019   PLT 375 02/11/2019   NA 135 02/11/2019   K 4.2 02/11/2019   CL 102 02/11/2019   CO2 22 02/11/2019   GLUCOSE 103 (H) 02/11/2019   BUN 13 02/11/2019   CREATININE 1.11 02/11/2019   BILITOT 0.4 03/11/2018   ALKPHOS 75 03/11/2018   AST 11 (L) 03/11/2018   ALT 8 03/11/2018   PROT 7.6 03/11/2018   ALBUMIN 2.9 (L) 03/11/2018   CALCIUM 9.2 02/11/2019   GFRAA >60 02/11/2019    Speciality Comments: No specialty comments available.  Procedures:  No procedures performed Allergies: Other and Statins   Assessment / Plan:     Visit Diagnoses: Rheumatoid arthritis with rheumatoid factor of multiple sites without organ or systems involvement ([place]) - 09/02/19: ANA negative, ESR>130, uric acid 6.5, RF 140, synovial fluid: WBC-5,097, neutrophils 81, culture negative -based on the clinical examination, radiographic finding and history of inflammatory arthritis the diagnosis of rheumatoid arthritis is most likely.  He states he was placed on prednisone by Dr. Aline Brochure which helped him a lot.  He is off prednisone right now.  Detailed counseling on rheumatoid arthritis was provided.  Different treatment options were discussed.  He has not had COVID-19 vaccination which he plans to get soon.  I have advised him to get COVID-19 vaccination.   Once he receives the vaccine then we can start him on prednisone taper.  He will contact us after receiving the vaccination.  He should be able to take prednisone 20 mg p.o. daily a week after receiving the COVID-19 vaccination.  I will taper prednisone by 5 mg every week and anticipate to start him on methotrexate once labs are available.  I briefly discussed indications side effects contraindications of methotrexate.  All the information was too much and overwhelming for him.  I will discuss methotrexate at length at the follow-up visit.  Plan: Cyclic citrul peptide antibody, IgG  Chronic pain of both shoulders -complains of chronic pain and discomfort in his bilateral shoulders.  He has difficulty lifting his arms.  May 14, 2019 x-rays done by Dr. Erlinda Hong showed acromioclavicular arthritis.  Contracture of right elbow-I am uncertain for how long he had contracture in his right elbow.  He had some swelling in his right elbow.  Pain in both hands -he had synovitis of his right wrist joint.  He had tenderness across the MCP joints.  Plan: XR Hand 2 View Right, XR Hand 2 View Left, x-rays were consistent with inflammatory arthritis and osteoarthritis overlap.  Sedimentation rate  Chronic pain of both knees-complains of pain and discomfort in his bilateral knee joints.  He has warmth and swelling in his left knee joint.  He is having difficulty with mobility due to joint pain and discomfort.  Pain in both feet -he complains of discomfort in his bilateral feet.  He had tenderness across MTPs but no synovitis was noted.  Plan: XR Foot 2 Views Right, XR Foot 2 Views Left  High risk medication use -I will obtain following labs today in anticipation to start him on immunosuppressive therapy.  My plan is to start him on methotrexate if his labs are stable.  Plan: CBC with Differential/Platelet, COMPLETE METABOLIC PANEL WITH GFR, Hepatitis B core antibody, IgM, Hepatitis B surface antigen, Hepatitis C antibody,  QuantiFERON-TB Gold Plus, Serum protein electrophoresis with reflex, IgG, IgA, IgM, Glucose 6 phosphate dehydrogenase, Thiopurine methyltransferase(tpmt)rbc  Other fatigue - Plan: CK, TSH  HNP (herniated nucleus  pulposus) with myelopathy, cervical - Status post fusion by Dr. Christella Noa.  He has limited range of motion of his cervical spine.  History of bilateral carpal tunnel release-he had bilateral carpal tunnel release by Dr. Erlinda Hong.  He reports having infection in his right hand after the surgery and had repeat surgery.  Wound infection after surgery  Symptomatic cholelithiasis-in the past.  Educated but COVID-19 virus infection-use of mask, social distancing and hand hygiene was emphasized.  This was advised to get a COVID-19 vaccination as soon as possible.  I also advised him to check the status of pneumococcal vaccine and Shingrix vaccine with his PCP.  Instructions were placed in the AVS.  Orders: Orders Placed This Encounter  Procedures  . XR Hand 2 View Right  . XR Hand 2 View Left  . XR Foot 2 Views Right  . XR Foot 2 Views Left  . CBC with Differential/Platelet  . COMPLETE METABOLIC PANEL WITH GFR  . Sedimentation rate  . CK  . TSH  . Cyclic citrul peptide antibody, IgG  . Hepatitis B core antibody, IgM  . Hepatitis B surface antigen  . Hepatitis C antibody  . QuantiFERON-TB Gold Plus  . Serum protein electrophoresis with reflex  . IgG, IgA, IgM  . Glucose 6 phosphate dehydrogenase  . Thiopurine methyltransferase(tpmt)rbc   No orders of the defined types were placed in this encounter.   Face-to-face time spent with patient was 50 minutes. Greater than 50% of time was spent in counseling and coordination of care.  Follow-Up Instructions: Return for Pain in joints , +RF.   Bo Merino, MD  Note - This record has been created using Editor, commissioning.  Chart creation errors have been sought, but may not always  have been located. Such creation errors do not  reflect on  the standard of medical care.

## 2020-03-04 ENCOUNTER — Other Ambulatory Visit: Payer: Self-pay

## 2020-03-04 ENCOUNTER — Ambulatory Visit: Payer: Self-pay

## 2020-03-04 ENCOUNTER — Other Ambulatory Visit: Payer: Self-pay | Admitting: Rheumatology

## 2020-03-04 ENCOUNTER — Encounter: Payer: Self-pay | Admitting: *Deleted

## 2020-03-04 ENCOUNTER — Ambulatory Visit: Payer: Medicare Other | Admitting: Rheumatology

## 2020-03-04 ENCOUNTER — Encounter: Payer: Self-pay | Admitting: Rheumatology

## 2020-03-04 VITALS — BP 109/73 | HR 96 | Resp 18 | Ht 68.0 in | Wt 281.0 lb

## 2020-03-04 DIAGNOSIS — M5000 Cervical disc disorder with myelopathy, unspecified cervical region: Secondary | ICD-10-CM

## 2020-03-04 DIAGNOSIS — M25511 Pain in right shoulder: Secondary | ICD-10-CM | POA: Diagnosis not present

## 2020-03-04 DIAGNOSIS — M79642 Pain in left hand: Secondary | ICD-10-CM

## 2020-03-04 DIAGNOSIS — M24521 Contracture, right elbow: Secondary | ICD-10-CM

## 2020-03-04 DIAGNOSIS — Z7189 Other specified counseling: Secondary | ICD-10-CM

## 2020-03-04 DIAGNOSIS — G8929 Other chronic pain: Secondary | ICD-10-CM

## 2020-03-04 DIAGNOSIS — Z9889 Other specified postprocedural states: Secondary | ICD-10-CM

## 2020-03-04 DIAGNOSIS — T8149XA Infection following a procedure, other surgical site, initial encounter: Secondary | ICD-10-CM

## 2020-03-04 DIAGNOSIS — K8020 Calculus of gallbladder without cholecystitis without obstruction: Secondary | ICD-10-CM

## 2020-03-04 DIAGNOSIS — M79641 Pain in right hand: Secondary | ICD-10-CM | POA: Diagnosis not present

## 2020-03-04 DIAGNOSIS — M0579 Rheumatoid arthritis with rheumatoid factor of multiple sites without organ or systems involvement: Secondary | ICD-10-CM | POA: Diagnosis not present

## 2020-03-04 DIAGNOSIS — M79671 Pain in right foot: Secondary | ICD-10-CM

## 2020-03-04 DIAGNOSIS — M79672 Pain in left foot: Secondary | ICD-10-CM | POA: Diagnosis not present

## 2020-03-04 DIAGNOSIS — M659 Synovitis and tenosynovitis, unspecified: Secondary | ICD-10-CM

## 2020-03-04 DIAGNOSIS — M25562 Pain in left knee: Secondary | ICD-10-CM

## 2020-03-04 DIAGNOSIS — M25512 Pain in left shoulder: Secondary | ICD-10-CM

## 2020-03-04 DIAGNOSIS — M25561 Pain in right knee: Secondary | ICD-10-CM

## 2020-03-04 DIAGNOSIS — R5383 Other fatigue: Secondary | ICD-10-CM

## 2020-03-04 DIAGNOSIS — R768 Other specified abnormal immunological findings in serum: Secondary | ICD-10-CM

## 2020-03-04 DIAGNOSIS — Z79899 Other long term (current) drug therapy: Secondary | ICD-10-CM

## 2020-03-04 NOTE — Telephone Encounter (Signed)
FYI : Patient did get first COVID vaccine today.

## 2020-03-04 NOTE — Telephone Encounter (Signed)
He should be able to start prednisone 1 week after the COVID-19 vaccination.  We will call in prednisone 5 mg tablets next week.  We will start prednisone 20 mg p.o. daily for 1 week and taper by 5 mg p.o. daily every week.

## 2020-03-04 NOTE — Patient Instructions (Addendum)
Please get COVID-19 immunization.  Once you receive your vaccine you can contact us to get a prednisone taper.  Prednisone can be started a week after receiving the COVID-19 vaccination.  COVID-19 vaccine recommendations:   COVID-19 vaccine is recommended for everyone (unless you are allergic to a vaccine component), even if you are on a medication that suppresses your immune system.   If you are on Methotrexate, Cellcept (mycophenolate), Rinvoq, Morrie Sheldon, and Olumiant- hold the medication for 1 week after each vaccine. Hold Methotrexate for 2 weeks after the single dose COVID-19 vaccine.   If you are on Orencia subcutaneous injection - hold medication one week prior to and one week after the first COVID-19 vaccine dose (only).   If you are on Orencia IV infusions- time vaccination administration so that the first COVID-19 vaccination will occur four weeks after the infusion and postpone the subsequent infusion by one week.   If you are on Cyclophosphamide or Rituxan infusions please contact your doctor prior to receiving the COVID-19 vaccine.   Do not take Tylenol or any anti-inflammatory medications (NSAIDs) 24 hours prior to the COVID-19 vaccination.   There is no direct evidence about the efficacy of the COVID-19 vaccine in individuals who are on medications that suppress the immune system.   Even if you are fully vaccinated, and you are on any medications that suppress your immune system, please continue to wear a mask, maintain at least six feet social distance and practice hand hygiene.   If you develop a COVID-19 infection, please contact your PCP or our office to determine if you need monoclonal antibody infusion.  The booster vaccine is now available for immunocompromised patients.   Please see the following web sites for updated information.   [email or web address]

## 2020-03-10 MED ORDER — PREDNISONE 5 MG PO TABS: ORAL_TABLET | ORAL | 0 refills | Status: DC

## 2020-03-10 NOTE — Progress Notes (Signed)
Please call to check on patient.  He should be starting on prednisone taper 1 week after his COVID-19 vaccination.

## 2020-03-12 LAB — CBC WITH DIFFERENTIAL/PLATELET
Absolute Monocytes: 638 cells/uL (ref 200–950)
Basophils Absolute: 34 cells/uL (ref 0–200)
Basophils Relative: 0.3 %
Eosinophils Absolute: 179 cells/uL (ref 15–500)
Eosinophils Relative: 1.6 %
HCT: 30.3 % — ABNORMAL LOW (ref 38.5–50.0)
Hemoglobin: 10 g/dL — ABNORMAL LOW (ref 13.2–17.1)
Lymphs Abs: 1747 cells/uL (ref 850–3900)
MCH: 28.5 pg (ref 27.0–33.0)
MCHC: 33 g/dL (ref 32.0–36.0)
MCV: 86.3 fL (ref 80.0–100.0)
MPV: 10.6 fL (ref 7.5–12.5)
Monocytes Relative: 5.7 %
Neutro Abs: 8602 cells/uL — ABNORMAL HIGH (ref 1500–7800)
Neutrophils Relative %: 76.8 %
Platelets: 333 10*3/uL (ref 140–400)
RBC: 3.51 10*6/uL — ABNORMAL LOW (ref 4.20–5.80)
RDW: 15.4 % — ABNORMAL HIGH (ref 11.0–15.0)
Total Lymphocyte: 15.6 %
WBC: 11.2 10*3/uL — ABNORMAL HIGH (ref 3.8–10.8)

## 2020-03-12 LAB — COMPLETE METABOLIC PANEL WITHOUT GFR
AG Ratio: 0.8 (calc) — ABNORMAL LOW (ref 1.0–2.5)
ALT: 4 U/L — ABNORMAL LOW (ref 9–46)
AST: 14 U/L (ref 10–35)
Albumin: 3.6 g/dL (ref 3.6–5.1)
Alkaline phosphatase (APISO): 79 U/L (ref 35–144)
BUN: 16 mg/dL (ref 7–25)
CO2: 25 mmol/L (ref 20–32)
Calcium: 9.7 mg/dL (ref 8.6–10.3)
Chloride: 102 mmol/L (ref 98–110)
Creat: 0.88 mg/dL (ref 0.70–1.25)
GFR, Est African American: 103 mL/min/1.73m2 (ref >=60)
GFR, Est Non African American: 89 mL/min/1.73m2 (ref >=60)
Globulin: 4.7 g/dL — ABNORMAL HIGH (ref 1.9–3.7)
Glucose, Bld: 88 mg/dL (ref 65–99)
Potassium: 4.4 mmol/L (ref 3.5–5.3)
Sodium: 137 mmol/L (ref 135–146)
Total Bilirubin: 0.3 mg/dL (ref 0.2–1.2)
Total Protein: 8.3 g/dL — ABNORMAL HIGH (ref 6.1–8.1)

## 2020-03-12 LAB — IGG, IGA, IGM
IgG (Immunoglobin G), Serum: 1380 mg/dL (ref 600–1540)
IgM, Serum: 113 mg/dL (ref 50–300)
Immunoglobulin A: 1966 mg/dL — ABNORMAL HIGH (ref 70–320)

## 2020-03-12 LAB — SEDIMENTATION RATE: Sed Rate: >130 mm/h — ABNORMAL HIGH (ref 0–20)

## 2020-03-12 LAB — PROTEIN ELECTROPHORESIS, SERUM, WITH REFLEX
Abnormal Protein Band1: 1.7 g/dL — ABNORMAL HIGH
Albumin ELP: 3.1 g/dL — ABNORMAL LOW (ref 3.8–4.8)
Alpha 1: 0.5 g/dL — ABNORMAL HIGH (ref 0.2–0.3)
Alpha 2: 0.9 g/dL (ref 0.5–0.9)
Beta 2: 0.6 g/dL — ABNORMAL HIGH (ref 0.2–0.5)
Beta Globulin: 1.9 g/dL — ABNORMAL HIGH (ref 0.4–0.6)
Gamma Globulin: 1.2 g/dL (ref 0.8–1.7)
Total Protein: 8.2 g/dL — ABNORMAL HIGH (ref 6.1–8.1)

## 2020-03-12 LAB — QUANTIFERON-TB GOLD PLUS
Mitogen-NIL: 2.19 [IU]/mL
NIL: 0.02 [IU]/mL
QuantiFERON-TB Gold Plus: NEGATIVE
TB1-NIL: 0 [IU]/mL
TB2-NIL: 0 [IU]/mL

## 2020-03-12 LAB — CK: Total CK: 101 U/L (ref 44–196)

## 2020-03-12 LAB — HEPATITIS B SURFACE ANTIGEN: Hepatitis B Surface Ag: NONREACTIVE

## 2020-03-12 LAB — HEPATITIS B CORE ANTIBODY, IGM: Hep B C IgM: NONREACTIVE

## 2020-03-12 LAB — THIOPURINE METHYLTRANSFERASE (TPMT), RBC: Thiopurine Methyltransferase, RBC: 13 nmol/hr/mL RBC

## 2020-03-12 LAB — HEPATITIS C ANTIBODY
Hepatitis C Ab: NONREACTIVE
SIGNAL TO CUT-OFF: 0.02 (ref <1.00)

## 2020-03-12 LAB — IFE INTERPRETATION: Immunofix Electr Int: DETECTED

## 2020-03-12 LAB — GLUCOSE 6 PHOSPHATE DEHYDROGENASE: G-6PDH: 16.1 U/g{Hb} (ref 7.0–20.5)

## 2020-03-12 LAB — TSH: TSH: 1.11 mIU/L (ref 0.40–4.50)

## 2020-03-12 LAB — CYCLIC CITRUL PEPTIDE ANTIBODY, IGG: Cyclic Citrullin Peptide Ab: >250 U — ABNORMAL HIGH

## 2020-03-16 ENCOUNTER — Telehealth: Payer: Self-pay | Admitting: *Deleted

## 2020-03-16 DIAGNOSIS — R778 Other specified abnormalities of plasma proteins: Secondary | ICD-10-CM

## 2020-03-16 NOTE — Telephone Encounter (Signed)
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  Please refer the patient to hem/onc for further evaluation.   Dr. Estanislado Pandy will discuss other lab results in detail at upcoming new patient follow up visit.

## 2020-03-29 NOTE — Progress Notes (Signed)
Office Visit Note  Patient: Eric Cruz             Date of Birth: 1950/05/17           MRN: [id number]             PCP: Seward Carol, MD Referring: Seward Carol, MD Visit Date: 04/07/2020 Occupation: '@GUAROCC' @  Subjective:  Other (Patient finished prednisone taper approximately 3 days ago. Patient noticed improvement while on prednisone. )   History of Present Illness: Eric Cruz is a 69 y.o. male with seropositive rheumatoid arthritis.  He was a started on prednisone after the labs were available.  He had good response to prednisone but according to him the pain did not completely resolved.  He continues to have some discomfort in his wrist joints, hands, left knee and bilateral ankles.  He also describes nocturnal pain in his shoulders.  Activities of Daily Living:  Patient reports morning stiffness for 15 minutes.   Patient Denies nocturnal pain.  Difficulty dressing/grooming: Denies Difficulty climbing stairs: Reports Difficulty getting out of chair: Reports Difficulty using hands for taps, buttons, cutlery, and/or writing: Reports  Review of Systems  Constitutional: Negative for fatigue.  HENT: Negative for mouth sores, mouth dryness and nose dryness.   Eyes: Positive for discharge. Negative for pain, itching and dryness.  Respiratory: Negative for shortness of breath and difficulty breathing.   Cardiovascular: Negative for chest pain and palpitations.  Gastrointestinal: Negative for blood in stool, constipation and diarrhea.  Endocrine: Negative for increased urination.  Genitourinary: Negative for difficulty urinating.  Musculoskeletal: Positive for arthralgias, joint pain and morning stiffness. Negative for joint swelling, myalgias, muscle tenderness and myalgias.  Skin: Positive for rash. Negative for color change and redness.  Allergic/Immunologic: Negative for susceptible to infections.  Neurological: Positive for numbness. Negative for dizziness,  headaches and memory loss.  Hematological: Negative for bruising/bleeding tendency.  Psychiatric/Behavioral: Positive for sleep disturbance. Negative for confusion.    PMFS History:  Patient Active Problem List   Diagnosis Date Noted  . Rheumatoid arthritis with rheumatoid factor of multiple sites without organ or systems involvement ([place]) 04/07/2020  . High risk medication use 04/07/2020  . 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  . Synovitis of left knee 09/16/2019  . Wound infection after surgery 02/11/2019  . HNP (herniated nucleus pulposus) with myelopathy, cervical 02/21/2018  . Postop check 12/10/2012  . Symptomatic cholelithiasis 11/15/2012    Past Medical History:  Diagnosis Date  . Arthritis   . BPH (benign prostatic hyperplasia)   . Cancer [facility])    Prostate: states he had a positive biopsy but had another one a year later and it was gone.   . Depression   . Dizziness   . GERD (gastroesophageal reflux disease)   . Gout   . High cholesterol   . Hypertension   . Sleep apnea    has cpap - not currently wearing  . Varicose veins     Family History  Problem Relation Age of Onset  . Hypertension Mother   . Diabetes Mother   . Hypertension Father   . Diabetes Father   . Healthy Son   . Healthy Daughter    Past Surgical History:  Procedure Laterality Date  . ANTERIOR CERVICAL DECOMP/DISCECTOMY FUSION N/A 02/21/2018   Procedure: Cervical Five-Six Cervical Six-Seven Anterior cervical decompression/discectomy/fusion;  Surgeon: Ashok Pall, MD;  Location: [facility];  Service: Neurosurgery;  Laterality: N/A;  Cervical Five-Six Cervical Six-Seven Anterior cervical decompression/discectomy/fusion  .  BIOPSY  10/09/2019   Procedure: BIOPSY;  Surgeon: Ronnette Juniper, MD;  Location: [facility];  Service: Gastroenterology;;  . BLADDER SURGERY    . CARPAL TUNNEL RELEASE Right 02/11/2019   Procedure: Right Carpal Tunnel Wound Irrigation;  Surgeon: Ashok Pall, MD;  Location: [facility];   Service: Neurosurgery;  Laterality: Right;  Right Carpal tunnel wound exploration/wash out  . CARPAL TUNNEL RELEASE Bilateral   . CHOLECYSTECTOMY  11/22/2012  . CHOLECYSTECTOMY  11/22/2012   Procedure: LAPAROSCOPIC CHOLECYSTECTOMY;  Surgeon: Gwenyth Ober, MD;  Location: [facility];  Service: General;;  . COLONOSCOPY    . COLONOSCOPY WITH PROPOFOL N/A 10/09/2019   Procedure: COLONOSCOPY WITH PROPOFOL;  Surgeon: Ronnette Juniper, MD;  Location: [facility];  Service: Gastroenterology;  Laterality: N/A;  . ESOPHAGOGASTRODUODENOSCOPY (EGD) WITH PROPOFOL N/A 10/09/2019   Procedure: ESOPHAGOGASTRODUODENOSCOPY (EGD) WITH PROPOFOL;  Surgeon: Ronnette Juniper, MD;  Location: [facility];  Service: Gastroenterology;  Laterality: N/A;  . POLYPECTOMY  10/09/2019   Procedure: POLYPECTOMY;  Surgeon: Ronnette Juniper, MD;  Location: [facility];  Service: Gastroenterology;;   Social History   Social History Narrative  . Not on file   Immunization History  Administered Date(s) Administered  . PFIZER SARS-COV-2 Vaccination 03/04/2020, 04/05/2020     Objective: Vital Signs: BP 136/84 (BP Location: Left Arm, Patient Position: Sitting, Cuff Size: Normal)   Pulse 87   Resp 18   Ht '5\' 8"'  (1.727 m)   Wt 271 lb (122.9 kg) Comment: per patient, patient in wheelchair  BMI 41.21 kg/m    Physical Exam Vitals and nursing note reviewed.  Constitutional:      Appearance: He is well-developed and well-nourished.  HENT:     Head: Normocephalic and atraumatic.  Eyes:     Extraocular Movements: EOM normal.     Conjunctiva/sclera: Conjunctivae normal.     Pupils: Pupils are equal, round, and reactive to light.  Cardiovascular:     Rate and Rhythm: Normal rate and regular rhythm.     Heart sounds: Normal heart sounds.  Pulmonary:     Effort: Pulmonary effort is normal.     Breath sounds: Normal breath sounds.  Abdominal:     General: Bowel sounds are normal.     Palpations: Abdomen is soft.  Musculoskeletal:     Cervical  back: Normal range of motion and neck supple.  Skin:    General: Skin is warm and dry.     Capillary Refill: Capillary refill takes less than 2 seconds.  Neurological:     Mental Status: He is alert and oriented to person, place, and time.  Psychiatric:        Mood and Affect: Mood and affect normal.        Behavior: Behavior normal.      Musculoskeletal Exam: He had limited range of motion of the cervical spine.  He was in a wheelchair but usually uses a cane.  He had painful range of motion of bilateral shoulders.  He had contracture in his right elbow.  He had synovitis over wrist joints and MCPs as described below.  He had tenderness on palpation over left knee joint and over bilateral ankles.  CDAI Exam: CDAI Score: 17.6  Patient Global: 8 mm; Provider Global: 8 mm Swollen: 7 ; Tender: 11  Joint Exam 04/07/2020      Right  Left  Glenohumeral   Tender   Tender  Wrist  Swollen Tender  Swollen Tender  MCP 2  Swollen Tender  Swollen Tender  MCP 3  Swollen Tender  Swollen Tender  Knee     Swollen Tender  Ankle   Tender   Tender     Investigation: No additional findings.  Imaging: No results found.  Recent Labs: Lab Results  Component Value Date   WBC 11.2 (H) 03/04/2020   HGB 10.0 (L) 03/04/2020   PLT 333 03/04/2020   NA 137 03/04/2020   K 4.4 03/04/2020   CL 102 03/04/2020   CO2 25 03/04/2020   GLUCOSE 88 03/04/2020   BUN 16 03/04/2020   CREATININE 0.88 03/04/2020   BILITOT 0.3 03/04/2020   ALKPHOS 75 03/11/2018   AST 14 03/04/2020   ALT 4 (L) 03/04/2020   PROT 8.3 (H) 03/04/2020   PROT 8.2 (H) 03/04/2020   ALBUMIN 2.9 (L) 03/11/2018   CALCIUM 9.7 03/04/2020   GFRAA 103 03/04/2020   QFTBGOLDPLUS NEGATIVE 03/04/2020   March 04, 2020 TB Gold negative, hepatitis B-, hepatitis C negative, IgA elevated, TPMT normal, G6PD normal, IFE IgA lambda monoclonal protein detected, CK 101, TSH normal ESR> 130, anti-CCP> 250  02/12/2019 HIV negative  Speciality  Comments: No specialty comments available.  Procedures:  No procedures performed Allergies: Other and Statins   Assessment / Plan:     Visit Diagnoses: Rheumatoid arthritis with rheumatoid factor of multiple sites without organ or systems involvement (H[facility]) - Positive RF, positive anti-CCP, elevated sed rate, synovitis involving multiple joints.  Prednisone taper was given at the last visit which helped him temporarily.  He continues to have pain and discomfort in multiple joints and synovitis as described above.  Different treatment options and their side effects were discussed at length.  He wants to proceed with methotrexate.  Side effects, contraindications were discussed and handout was given, consent was obtained.  I will start him on methotrexate 6 tablets p.o. weekly along with folic acid 2 mg p.o. daily.  We will check labs in 2 weeks x 2.  If labs are stable then he will increase methotrexate to 8 tablets p.o. weekly.  He does not want to use injectable medications.  I will also give prednisone as a bridging therapy starting at 10 mg p.o. daily for 2 weeks, 5 mg p.o. daily for 2 weeks and then 2.5 mg p.o. daily for 2 weeks.   Drug Counseling TB Gold: March 04, 2020 Hepatitis panel: March 04, 2020  Chest-xray: 10/09/2017  Contraception: N/A  Alcohol use: None  Patient was counseled on the purpose, proper use, and adverse effects of methotrexate including nausea, infection, and signs and symptoms of pneumonitis.  Reviewed instructions with patient to take methotrexate weekly along with folic acid daily.  Discussed the importance of frequent monitoring of kidney and liver function and blood counts, and provided patient with standing lab instructions.  Counseled patient to avoid NSAIDs and alcohol while on methotrexate.  Provided patient with educational materials on methotrexate and answered all questions.  Advised patient to get annual influenza vaccine and to get a pneumococcal vaccine  if patient has not already had one.  Patient voiced understanding.  Patient consented to methotrexate use.  Will upload into chart.    High risk medication use-he will get CBC with differential, CMP with GFR in 2 weeks x 2 and then every 2 months.  If labs are stable we can monitor labs every 3 months.  Arthritis of both acromioclavicular joints - Followed by Dr. Erlinda Hong.  Contracture of right elbow-without discomfort.  HNP (herniated nucleus pulposus) with myelopathy, cervical - Status  post fusion by Dr. Christella Noa 11/ 2019.  He has chronic pain.  History of bilateral carpal tunnel release - by Dr. Erlinda Hong  Wound infection after surgery - Right wrist joint after carpal tunnel release.  Symptomatic cholelithiasis  Abnormal SPEP - Abnormal IFE.  Patient was referred to hematology.  Patient has appointment tomorrow.  Educated but COVID-19 virus infection-he is fully vaccinated.  Instructions about future booster was placed.  Use of mask, social distancing and hand hygiene was discussed.  Information was placed in the AVS.  Orders: No orders of the defined types were placed in this encounter.  Meds ordered this encounter  Medications  . folic acid (FOLVITE) 1 MG tablet    Sig: Take 2 tablets (2 mg total) by mouth daily.    Dispense:  180 tablet    Refill:  2  . methotrexate (RHEUMATREX) 2.5 MG tablet    Sig: Take 6 tabs po once weekly for 2 weeks, if labs are stable increase to 8 tabs po once weekly. Caution:Chemotherapy. Protect from light.    Dispense:  28 tablet    Refill:  0  . predniSONE (DELTASONE) 5 MG tablet    Sig: Take 2 tabs po qd x 2 weeks, take 1 tab po qd x 2 weeks and then take 0.5 tab po qd x 2 weeks.    Dispense:  49 tablet    Refill:  0      Follow-Up Instructions: Return in about 6 weeks (around 05/19/2020) for Rheumatoid arthritis, Osteoarthritis.   Bo Merino, MD  Note - This record has been created using Editor, commissioning.  Chart creation errors have been  sought, but may not always  have been located. Such creation errors do not reflect on  the standard of medical care.

## 2020-04-07 ENCOUNTER — Ambulatory Visit: Payer: Medicare Other | Admitting: Rheumatology

## 2020-04-07 ENCOUNTER — Other Ambulatory Visit: Payer: Self-pay

## 2020-04-07 ENCOUNTER — Encounter: Payer: Self-pay | Admitting: Rheumatology

## 2020-04-07 VITALS — BP 136/84 | HR 87 | Resp 18 | Ht 68.0 in | Wt 271.0 lb

## 2020-04-07 DIAGNOSIS — M19012 Primary osteoarthritis, left shoulder: Secondary | ICD-10-CM | POA: Diagnosis not present

## 2020-04-07 DIAGNOSIS — M19011 Primary osteoarthritis, right shoulder: Secondary | ICD-10-CM

## 2020-04-07 DIAGNOSIS — M0579 Rheumatoid arthritis with rheumatoid factor of multiple sites without organ or systems involvement: Secondary | ICD-10-CM | POA: Diagnosis not present

## 2020-04-07 DIAGNOSIS — K8020 Calculus of gallbladder without cholecystitis without obstruction: Secondary | ICD-10-CM

## 2020-04-07 DIAGNOSIS — M24521 Contracture, right elbow: Secondary | ICD-10-CM | POA: Diagnosis not present

## 2020-04-07 DIAGNOSIS — Z79899 Other long term (current) drug therapy: Secondary | ICD-10-CM

## 2020-04-07 DIAGNOSIS — Z9889 Other specified postprocedural states: Secondary | ICD-10-CM | POA: Diagnosis not present

## 2020-04-07 DIAGNOSIS — T8149XA Infection following a procedure, other surgical site, initial encounter: Secondary | ICD-10-CM

## 2020-04-07 DIAGNOSIS — M5000 Cervical disc disorder with myelopathy, unspecified cervical region: Secondary | ICD-10-CM | POA: Diagnosis not present

## 2020-04-07 DIAGNOSIS — R778 Other specified abnormalities of plasma proteins: Secondary | ICD-10-CM

## 2020-04-07 DIAGNOSIS — M069 Rheumatoid arthritis, unspecified: Secondary | ICD-10-CM | POA: Insufficient documentation

## 2020-04-07 MED ORDER — METHOTREXATE 2.5 MG PO TABS: ORAL_TABLET | ORAL | 0 refills | Status: DC

## 2020-04-07 MED ORDER — PREDNISONE 5 MG PO TABS: ORAL_TABLET | ORAL | 0 refills | Status: DC

## 2020-04-07 MED ORDER — FOLIC ACID 1 MG PO TABS: 2.0000 mg | ORAL_TABLET | Freq: Every day | ORAL | 2 refills | Status: DC

## 2020-04-07 NOTE — Patient Instructions (Addendum)
Methotrexate tablets What is this medicine? METHOTREXATE (METH oh TREX ate) is a chemotherapy drug used to treat cancer including breast cancer, leukemia, and lymphoma. This medicine can also be used to treat psoriasis and certain kinds of arthritis. This medicine may be used for other purposes; ask your health care provider or pharmacist if you have questions. COMMON BRAND NAME(S): Rheumatrex, Trexall What should I tell my health care provider before I take this medicine? They need to know if you have any of these conditions:  fluid in the stomach area or lungs  if you often drink alcohol  infection or immune system problems  kidney disease or on hemodialysis  liver disease  low blood counts, like low white cell, platelet, or red cell counts  lung disease  radiation therapy  stomach ulcers  ulcerative colitis  an unusual or allergic reaction to methotrexate, other medicines, foods, dyes, or preservatives  pregnant or trying to get pregnant  breast-feeding How should I use this medicine? Take this medicine by mouth with a glass of water. Follow the directions on the prescription label. Take your medicine at regular intervals. Do not take it more often than directed. Do not stop taking except on your doctor's advice. Make sure you know why you are taking this medicine and how often you should take it. If this medicine is used for a condition that is not cancer, like arthritis or psoriasis, it should be taken weekly, NOT daily. Taking this medicine more often than directed can cause serious side effects, even death. Talk to your healthcare provider about safe handling and disposal of this medicine. You may need to take special precautions. Talk to your pediatrician regarding the use of this medicine in children. While this drug may be prescribed for selected conditions, precautions do apply. Overdosage: If you think you have taken too much of this medicine contact a poison control  center or emergency room at once. NOTE: This medicine is only for you. Do not share this medicine with others. What if I miss a dose? If you miss a dose, talk with your doctor or health care professional. Do not take double or extra doses. What may interact with this medicine? This medicine may interact with the following medication:  acitretin  aspirin and aspirin-like medicines including salicylates  azathioprine  certain antibiotics like penicillins, tetracycline, and chloramphenicol  cyclosporine  gold  hydroxychloroquine  live virus vaccines  NSAIDs, medicines for pain and inflammation, like ibuprofen or naproxen  other cytotoxic agents  penicillamine  phenylbutazone  phenytoin  probenecid  retinoids such as isotretinoin and tretinoin  steroid medicines like prednisone or cortisone  sulfonamides like sulfasalazine and trimethoprim/sulfamethoxazole  theophylline This list may not describe all possible interactions. Give your health care provider a list of all the medicines, herbs, non-prescription drugs, or dietary supplements you use. Also tell them if you smoke, drink alcohol, or use illegal drugs. Some items may interact with your medicine. What should I watch for while using this medicine? Avoid alcoholic drinks. This medicine can make you more sensitive to the sun. Keep out of the sun. If you cannot avoid being in the sun, wear protective clothing and use sunscreen. Do not use sun lamps or tanning beds/booths. You may need blood work done while you are taking this medicine. Call your doctor or health care professional for advice if you get a fever, chills or sore throat, or other symptoms of a cold or flu. Do not treat yourself. This drug decreases your  body's ability to fight infections. Try to avoid being around people who are sick. This medicine may increase your risk to bruise or bleed. Call your doctor or health care professional if you notice any unusual  bleeding. Check with your doctor or health care professional if you get an attack of severe diarrhea, nausea and vomiting, or if you sweat a lot. The loss of too much body fluid can make it dangerous for you to take this medicine. Talk to your doctor about your risk of cancer. You may be more at risk for certain types of cancers if you take this medicine. Both men and women must use effective birth control with this medicine. Do not become pregnant while taking this medicine or until at least 1 normal menstrual cycle has occurred after stopping it. Women should inform their doctor if they wish to become pregnant or think they might be pregnant. Men should not father a child while taking this medicine and for 3 months after stopping it. There is a potential for serious side effects to an unborn child. Talk to your health care professional or pharmacist for more information. Do not breast-feed an infant while taking this medicine. What side effects may I notice from receiving this medicine? Side effects that you should report to your doctor or health care professional as soon as possible:  allergic reactions like skin rash, itching or hives, swelling of the face, lips, or tongue  breathing problems or shortness of breath  diarrhea  dry, nonproductive cough  low blood counts - this medicine may decrease the number of white blood cells, red blood cells and platelets. You may be at increased risk for infections and bleeding.  mouth sores  redness, blistering, peeling or loosening of the skin, including inside the mouth  signs of infection - fever or chills, cough, sore throat, pain or trouble passing urine  signs and symptoms of bleeding such as bloody or black, tarry stools; red or dark-brown urine; spitting up blood or brown material that looks like coffee grounds; red spots on the skin; unusual bruising or bleeding from the eye, gums, or nose  signs and symptoms of kidney injury like trouble  passing urine or change in the amount of urine  signs and symptoms of liver injury like dark yellow or brown urine; general ill feeling or flu-like symptoms; light-colored stools; loss of appetite; nausea; right upper belly pain; unusually weak or tired; yellowing of the eyes or skin Side effects that usually do not require medical attention (report to your doctor or health care professional if they continue or are bothersome):  dizziness  hair loss  tiredness  upset stomach  vomiting This list may not describe all possible side effects. Call your doctor for medical advice about side effects. You may report side effects to FDA at 1-800-FDA-1088. Where should I keep my medicine? Keep out of the reach of children. Store at room temperature between 20 and 25 degrees C (68 and 77 degrees F). Protect from light. Throw away any unused medicine after the expiration date. NOTE: This sheet is a summary. It may not cover all possible information. If you have questions about this medicine, talk to your doctor, pharmacist, or health care provider.  2020 Elsevier/Gold Standard (2016-11-09 13:38:43)   Standing Labs We placed an order today for your standing lab work.   Please have your standing labs drawn in 2 weeks x 2 and then every 2 months  If possible, please have your labs drawn  2 weeks prior to your appointment so that the provider can discuss your results at your appointment.  We have open lab daily Monday through Thursday from 8:30-12:30 PM and 1:30-4:30 PM and Friday from 8:30-12:30 PM and 1:30-4:00 PM at the office of Dr. Pollyann Savoy, [facility].   Please be advised, patients with office appointments requiring lab work will take precedents over walk-in lab work.  If possible, please come for your lab work on Monday and Friday afternoons, as you may experience shorter wait times. The office is located at [address], [place], [place] [address] No  appointment is necessary.   Labs are drawn by [facility]. Please bring your co-pay at the time of your lab draw.  You may receive a bill from [facility] for your lab work.  If you wish to have your labs drawn at another location, please call the office 24 hours in advance to send orders.  If you have any questions regarding directions or hours of operation,  please call [phone].   As a reminder, please drink plenty of water prior to coming for your lab work. Thanks!  COVID-19 vaccine recommendations:   COVID-19 vaccine is recommended for everyone (unless you are allergic to a vaccine component), even if you are on a medication that suppresses your immune system.   If you are on Methotrexate, Cellcept (mycophenolate), Rinvoq, Harriette Ohara, and Olumiant- hold the medication for 1 week after each vaccine. Hold Methotrexate for 2 weeks after the single dose COVID-19 vaccine.   Do not take Tylenol or any anti-inflammatory medications (NSAIDs) 24 hours prior to the COVID-19 vaccination.   There is no direct evidence about the efficacy of the COVID-19 vaccine in individuals who are on medications that suppress the immune system.   Even if you are fully vaccinated, and you are on any medications that suppress your immune system, please continue to wear a mask, maintain at least six feet social distance and practice hand hygiene.   If you develop a COVID-19 infection, please contact your PCP or our office to determine if you need monoclonal antibody infusion.  The booster vaccine is now available for immunocompromised patients.   Please see the following web sites for updated information.   [email or web address]

## 2020-04-08 ENCOUNTER — Encounter: Payer: Self-pay | Admitting: Oncology

## 2020-04-08 ENCOUNTER — Inpatient Hospital Stay: Payer: Medicare Other | Attending: Oncology | Admitting: Oncology

## 2020-04-08 ENCOUNTER — Ambulatory Visit
Admission: RE | Admit: 2020-04-08 | Discharge: 2020-04-08 | Disposition: A | Discharge status: Home / Self Care | Payer: Medicare Other | Source: Ambulatory Visit | Attending: Oncology | Admitting: Oncology

## 2020-04-08 ENCOUNTER — Inpatient Hospital Stay: Payer: Medicare Other

## 2020-04-08 VITALS — BP 123/79 | HR 98 | Temp 100.5°F | Resp 18 | Wt 282.0 lb

## 2020-04-08 DIAGNOSIS — C9000 Multiple myeloma not having achieved remission: Secondary | ICD-10-CM | POA: Diagnosis not present

## 2020-04-08 DIAGNOSIS — R778 Other specified abnormalities of plasma proteins: Secondary | ICD-10-CM | POA: Insufficient documentation

## 2020-04-08 DIAGNOSIS — R779 Abnormality of plasma protein, unspecified: Secondary | ICD-10-CM | POA: Diagnosis not present

## 2020-04-08 DIAGNOSIS — Z8249 Family history of ischemic heart disease and other diseases of the circulatory system: Secondary | ICD-10-CM | POA: Diagnosis not present

## 2020-04-08 DIAGNOSIS — G4730 Sleep apnea, unspecified: Secondary | ICD-10-CM | POA: Diagnosis not present

## 2020-04-08 DIAGNOSIS — D649 Anemia, unspecified: Secondary | ICD-10-CM

## 2020-04-08 DIAGNOSIS — Z87891 Personal history of nicotine dependence: Secondary | ICD-10-CM | POA: Diagnosis not present

## 2020-04-08 DIAGNOSIS — Z79899 Other long term (current) drug therapy: Secondary | ICD-10-CM | POA: Diagnosis not present

## 2020-04-08 DIAGNOSIS — Z7952 Long term (current) use of systemic steroids: Secondary | ICD-10-CM | POA: Diagnosis not present

## 2020-04-08 DIAGNOSIS — N400 Enlarged prostate without lower urinary tract symptoms: Secondary | ICD-10-CM | POA: Diagnosis not present

## 2020-04-08 DIAGNOSIS — M069 Rheumatoid arthritis, unspecified: Secondary | ICD-10-CM | POA: Diagnosis not present

## 2020-04-08 DIAGNOSIS — Z833 Family history of diabetes mellitus: Secondary | ICD-10-CM | POA: Diagnosis not present

## 2020-04-08 DIAGNOSIS — D72829 Elevated white blood cell count, unspecified: Secondary | ICD-10-CM | POA: Diagnosis not present

## 2020-04-08 DIAGNOSIS — E7800 Pure hypercholesterolemia, unspecified: Secondary | ICD-10-CM | POA: Diagnosis not present

## 2020-04-08 DIAGNOSIS — K219 Gastro-esophageal reflux disease without esophagitis: Secondary | ICD-10-CM | POA: Diagnosis not present

## 2020-04-08 DIAGNOSIS — I10 Essential (primary) hypertension: Secondary | ICD-10-CM | POA: Insufficient documentation

## 2020-04-08 LAB — IRON AND TIBC
Iron: 31 ug/dL — ABNORMAL LOW (ref 45–182)
Saturation Ratios: 12 % — ABNORMAL LOW (ref 17.9–39.5)
TIBC: 259 ug/dL (ref 250–450)
UIBC: 228 ug/dL

## 2020-04-08 LAB — RETIC PANEL
Immature Retic Fract: 19 % — ABNORMAL HIGH (ref 2.3–15.9)
RBC.: 3.82 MIL/uL — ABNORMAL LOW (ref 4.22–5.81)
Retic Count, Absolute: 66.9 10*3/uL (ref 19.0–186.0)
Retic Ct Pct: 1.8 % (ref 0.4–3.1)
Reticulocyte Hemoglobin: 30.2 pg (ref >27.9)

## 2020-04-08 LAB — CBC WITH DIFFERENTIAL/PLATELET
Abs Immature Granulocytes: 0.07 10*3/uL (ref 0.00–0.07)
Basophils Absolute: 0 10*3/uL (ref 0.0–0.1)
Basophils Relative: 0 %
Eosinophils Absolute: 0.2 10*3/uL (ref 0.0–0.5)
Eosinophils Relative: 2 %
HCT: 34.5 % — ABNORMAL LOW (ref 39.0–52.0)
Hemoglobin: 10.9 g/dL — ABNORMAL LOW (ref 13.0–17.0)
Immature Granulocytes: 1 %
Lymphocytes Relative: 22 %
Lymphs Abs: 2.2 10*3/uL (ref 0.7–4.0)
MCH: 28.3 pg (ref 26.0–34.0)
MCHC: 31.6 g/dL (ref 30.0–36.0)
MCV: 89.6 fL (ref 80.0–100.0)
Monocytes Absolute: 0.6 10*3/uL (ref 0.1–1.0)
Monocytes Relative: 6 %
Neutro Abs: 6.9 10*3/uL (ref 1.7–7.7)
Neutrophils Relative %: 69 %
Platelets: 272 10*3/uL (ref 150–400)
RBC: 3.85 MIL/uL — ABNORMAL LOW (ref 4.22–5.81)
RDW: 17.3 % — ABNORMAL HIGH (ref 11.5–15.5)
WBC: 10 10*3/uL (ref 4.0–10.5)
nRBC: 0 % (ref 0.0–0.2)

## 2020-04-08 LAB — FOLATE: Folate: 17.5 ng/mL (ref >5.9)

## 2020-04-08 LAB — TECHNOLOGIST SMEAR REVIEW: Plt Morphology: ADEQUATE

## 2020-04-08 LAB — VITAMIN B12: Vitamin B-12: 924 pg/mL — ABNORMAL HIGH (ref 180–914)

## 2020-04-08 LAB — FERRITIN: Ferritin: 452 ng/mL — ABNORMAL HIGH (ref 24–336)

## 2020-04-08 NOTE — Progress Notes (Signed)
Hematology/Oncology Consult note [facility] [phone] [phone]   Patient Care Team: Seward Carol, MD as PCP - General (Internal Medicine)  REFERRING PROVIDER: Seward Carol, MD  CHIEF COMPLAINTS/REASON FOR VISIT:  Evaluation of abnormal SPEP, anemia  HISTORY OF PRESENTING ILLNESS:  Eric Cruz is a 70 y.o. male who was seen in consultation at the request of Seward Carol, MD for evaluation of abnormal SPEP results, anemia. .   Patient recently had work up done at rheumatologist's office and was diagnosed with rheumatoid arthritis and patient was recommended to start MTX. He has not started yet Labs reviewed,  03/04/20 SPEP showed abnormal protein band of 1.7g/dl.,   , and IFE showed IgA Lamda monoclonal protein.  He has had weight loss last year, no weight loss and some weight gain during the past 6 months.   Multiple join pain.   Review of Systems  Constitutional: Positive for fatigue. Negative for appetite change, chills, fever and unexpected weight change.  HENT:   Negative for hearing loss and voice change.   Eyes: Negative for eye problems and icterus.  Respiratory: Negative for chest tightness, cough and shortness of breath.   Cardiovascular: Negative for chest pain and leg swelling.  Gastrointestinal: Negative for abdominal distention and abdominal pain.  Endocrine: Negative for hot flashes.  Genitourinary: Negative for difficulty urinating, dysuria and frequency.   Musculoskeletal: Positive for arthralgias and back pain.  Skin: Negative for itching and rash.  Neurological: Negative for light-headedness and numbness.  Hematological: Negative for adenopathy. Does not bruise/bleed easily.  Psychiatric/Behavioral: Negative for confusion.     MEDICAL HISTORY:  Past Medical History:  Diagnosis Date  . Arthritis   . BPH (benign prostatic hyperplasia)   . Cancer [facility])    Prostate: states he had a positive biopsy  but had another one a year later and it was gone.   . Depression   . Dizziness   . GERD (gastroesophageal reflux disease)   . Gout   . High cholesterol   . Hypertension   . Sleep apnea    has cpap - not currently wearing  . Varicose veins     SURGICAL HISTORY: Past Surgical History:  Procedure Laterality Date  . ANTERIOR CERVICAL DECOMP/DISCECTOMY FUSION N/A 02/21/2018   Procedure: Cervical Five-Six Cervical Six-Seven Anterior cervical decompression/discectomy/fusion;  Surgeon: Ashok Pall, MD;  Location: [place];  Service: Neurosurgery;  Laterality: N/A;  Cervical Five-Six Cervical Six-Seven Anterior cervical decompression/discectomy/fusion  . BIOPSY  10/09/2019   Procedure: BIOPSY;  Surgeon: Ronnette Juniper, MD;  Location: [facility];  Service: Gastroenterology;;  . BLADDER SURGERY    . CARPAL TUNNEL RELEASE Right 02/11/2019   Procedure: Right Carpal Tunnel Wound Irrigation;  Surgeon: Ashok Pall, MD;  Location: [place];  Service: Neurosurgery;  Laterality: Right;  Right Carpal tunnel wound exploration/wash out  . CARPAL TUNNEL RELEASE Bilateral   . CHOLECYSTECTOMY  11/22/2012  . CHOLECYSTECTOMY  11/22/2012   Procedure: LAPAROSCOPIC CHOLECYSTECTOMY;  Surgeon: Gwenyth Ober, MD;  Location: [place];  Service: General;;  . COLONOSCOPY    . COLONOSCOPY WITH PROPOFOL N/A 10/09/2019   Procedure: COLONOSCOPY WITH PROPOFOL;  Surgeon: Ronnette Juniper, MD;  [facility];  Service: Gastroenterology;  Laterality: N/A;  . ESOPHAGOGASTRODUODENOSCOPY (EGD) WITH PROPOFOL N/A 10/09/2019   Procedure: ESOPHAGOGASTRODUODENOSCOPY (EGD) WITH PROPOFOL;  Surgeon: Ronnette Juniper, MD;  [facility];  Service: Gastroenterology;  Laterality: N/A;  . POLYPECTOMY  10/09/2019   Procedure: POLYPECTOMY;  Surgeon: Ronnette Juniper, MD;  [facility];  Service: Gastroenterology;;    SOCIAL HISTORY: Social History   Socioeconomic History  . Marital status: Married    Spouse name: Not on file  . Number of  children: Not on file  . Years of education: Not on file  . Highest education level: Not on file  Occupational History  . Not on file  Tobacco Use  . Smoking status: Former Smoker    Quit date: 04/12/1998    Years since quitting: 22.0  . Smokeless tobacco: Never Used  Vaping Use  . Vaping Use: Never used  Substance and Sexual Activity  . Alcohol use: No  . Drug use: No  . Sexual activity: Not on file  Other Topics Concern  . Not on file  Social History Narrative  . Not on file   Social Determinants of Health   Financial Resource Strain: Not on file  Food Insecurity: Not on file  Transportation Needs: Not on file  Physical Activity: Not on file  Stress: Not on file  Social Connections: Not on file  Intimate Partner Violence: Not on file    FAMILY HISTORY: Family History  Problem Relation Age of Onset  . Hypertension Mother   . Diabetes Mother   . Multiple myeloma Mother   . Hypertension Father   . Diabetes Father   . Healthy Son   . Healthy Daughter     ALLERGIES:  is allergic to other and statins.  MEDICATIONS:  Current Outpatient Medications  Medication Sig Dispense Refill  . Ascorbic Acid (VITAMIN C PO) Take by mouth daily.    Marland Kitchen ascorbic acid (VITAMIN C) 500 MG tablet Take 500 mg by mouth daily.    . calcium-vitamin D (OSCAL WITH D) 500-200 MG-UNIT tablet Take 1 tablet by mouth daily.    . cyanocobalamin 2000 MCG tablet Take 2,000 mcg by mouth daily.    . ferrous sulfate 325 (65 FE) MG EC tablet Take 325 mg by mouth daily with breakfast.    . finasteride (PROSCAR) 5 MG tablet Take 5 mg by mouth daily.    . Multiple Vitamins-Minerals (MULTIVITAMIN WITH MINERALS) tablet Take 1 tablet by mouth in the morning and at bedtime.    . predniSONE (DELTASONE) 5 MG tablet Take 2 tabs po qd x 2 weeks, take 1 tab po qd x 2 weeks and then take 0.5 tab po qd x 2 weeks. 49 tablet 0  . tamsulosin (FLOMAX) 0.4 MG CAPS capsule Take 0.4 mg by mouth daily after supper.    Marland Kitchen  VITAMIN D PO Take 50 mcg by mouth daily.    . Vitamins A & D (VITAMIN A & D) ointment Apply 1 application topically daily as needed for dry skin.    . folic acid (FOLVITE) 1 MG tablet Take 2 tablets (2 mg total) by mouth daily. (Patient not taking: Reported on 04/08/2020) 180 tablet 2  . methotrexate (RHEUMATREX) 2.5 MG tablet Take 6 tabs po once weekly for 2 weeks, if labs are stable increase to 8 tabs po once weekly. Caution:Chemotherapy. Protect from light. (Patient not taking: Reported on 04/08/2020) 28 tablet 0  . traMADol (ULTRAM) 50 MG tablet TAKE 1 TO 2 TABLETS BY MOUTH ONCE DAILY AS NEEDED (Patient not taking: No sig reported) 20 tablet 0   No current facility-administered medications for this visit.     PHYSICAL EXAMINATION: ECOG PERFORMANCE STATUS: 1 - Symptomatic but completely ambulatory Vitals:   04/08/20 1349  BP: 123/79  Pulse: 98  Resp:  18  Temp: (!) 100.5 F (38.1 C)   Filed Weights   04/08/20 1349  Weight: 282 lb (127.9 kg)    Physical Exam Constitutional:      General: He is not in acute distress.    Appearance: He is obese.     Comments: Patient sits in the wheelchair.  HENT:     Head: Normocephalic and atraumatic.  Eyes:     General: No scleral icterus. Cardiovascular:     Rate and Rhythm: Normal rate and regular rhythm.     Heart sounds: Normal heart sounds.  Pulmonary:     Effort: Pulmonary effort is normal. No respiratory distress.     Breath sounds: No wheezing.  Abdominal:     General: Bowel sounds are normal. There is no distension.     Palpations: Abdomen is soft.  Musculoskeletal:        General: No deformity. Normal range of motion.     Cervical back: Normal range of motion and neck supple.     Comments: Trace edema, bilateral low extremities.   Skin:    General: Skin is warm and dry.     Findings: No erythema or rash.  Neurological:     Mental Status: He is alert and oriented to person, place, and time. Mental status is at baseline.      Cranial Nerves: No cranial nerve deficit.     Coordination: Coordination normal.  Psychiatric:        Mood and Affect: Mood normal.      LABORATORY DATA:  I have reviewed the data as listed Lab Results  Component Value Date   WBC 10.0 04/08/2020   HGB 10.9 (L) 04/08/2020   HCT 34.5 (L) 04/08/2020   MCV 89.6 04/08/2020   PLT 272 04/08/2020   Recent Labs    03/04/20 1006  NA 137  K 4.4  CL 102  CO2 25  GLUCOSE 88  BUN 16  CREATININE 0.88  CALCIUM 9.7  GFRNONAA 89  GFRAA 103  PROT 8.2*  8.3*  AST 14  ALT 4*  BILITOT 0.3   Iron/TIBC/Ferritin/ %Sat    Component Value Date/Time   IRON 31 (L) 04/08/2020 1509   TIBC 259 04/08/2020 1509   FERRITIN 452 (H) 04/08/2020 1509   IRONPCTSAT 12 (L) 04/08/2020 1509      RADIOGRAPHIC STUDIES: I have personally reviewed the radiological images as listed and agreed with the findings in the report.  DG Bone Survey Met  Result Date: 04/08/2020 CLINICAL DATA:  Abnormal serum protein electrophoresis. EXAM: METASTATIC BONE SURVEY COMPARISON:  Prior cervical spine, LEFT shoulder, knee, LEFT hip and chest radiographs. FINDINGS: No focal bony lesions are identified. No lytic bony lesions are noted. No acute bony abnormalities are present. Degenerative changes in the hips and spine are noted. The lungs are clear. Aortic atherosclerotic calcifications are noted. IMPRESSION: 1. No focal bony lesions or evidence of bony metastatic disease. 2.  Aortic Atherosclerosis (ICD10-I70.0). Electronically Signed   By: Margarette Canada M.D.   On: 04/08/2020 19:39     ASSESSMENT & PLAN:  1. Normocytic anemia   2. Abnormal SPEP   3. Leukocytosis, unspecified type    Abnormal SPEP results,  Repeat multiple myeloma panel [SPEP, IFE, light chain ratio], UPEP, UFE. Beta microglobulin, flowcytometry Obtain skeletal survey MGUS, need further work up  Anemia, Hb is 10.9 check iron tibc ferritin, B12, folate  Follow up in 1-2 weeks to discuss results.    Orders Placed This Encounter  Procedures  . DG Bone Survey Met    Standing Status:   Future    Number of Occurrences:   1    Standing Expiration Date:   04/08/2021    Order Specific Question:   Reason for Exam (SYMPTOM  OR DIAGNOSIS REQUIRED)    Answer:   abnormal spep    Order Specific Question:   Preferred imaging location?    Answer:   [facility]  . CBC with Differential/Platelet    Standing Status:   Future    Number of Occurrences:   1    Standing Expiration Date:   04/08/2021  . Retic Panel    Standing Status:   Future    Number of Occurrences:   1    Standing Expiration Date:   04/08/2021  . Multiple Myeloma Panel (SPEP&IFE w/QIG)    Standing Status:   Future    Number of Occurrences:   1    Standing Expiration Date:   04/08/2021  . Kappa/lambda light chains    Standing Status:   Future    Number of Occurrences:   1    Standing Expiration Date:   04/08/2021  . Technologist smear review    Standing Status:   Future    Number of Occurrences:   1    Standing Expiration Date:   04/08/2021  . Flow cytometry panel-leukemia/lymphoma work-up    Standing Status:   Future    Number of Occurrences:   1    Standing Expiration Date:   04/08/2021  . Iron and TIBC    Standing Status:   Future    Number of Occurrences:   1    Standing Expiration Date:   04/08/2021  . Ferritin    Standing Status:   Future    Number of Occurrences:   1    Standing Expiration Date:   10/06/2020  . Folate    Standing Status:   Future    Number of Occurrences:   1    Standing Expiration Date:   04/08/2021  . Vitamin B12    Standing Status:   Future    Number of Occurrences:   1    Standing Expiration Date:   04/08/2021  . Protein Electro, Random Urine    Standing Status:   Future    Number of Occurrences:   1    Standing Expiration Date:   04/08/2021  . Beta 2 microglobulin, serum    Standing Status:   Future    Number of Occurrences:   1    Standing Expiration Date:   04/08/2021    All questions were  answered. The patient knows to call the clinic with any problems questions or concerns.  Cc Seward Carol, MD  Return of visit: 2 weeks Thank you for this kind referral and the opportunity to participate in the care of this patient. A copy of today's note is routed to referring provider   Earlie Server, MD, PhD 04/08/2020

## 2020-04-08 NOTE — Progress Notes (Signed)
New patient evaluation.   

## 2020-04-09 LAB — BETA 2 MICROGLOBULIN, SERUM: Beta-2 Microglobulin: 2.7 mg/L — ABNORMAL HIGH (ref 0.6–2.4)

## 2020-04-09 LAB — KAPPA/LAMBDA LIGHT CHAINS
Kappa free light chain: 30.1 mg/L — ABNORMAL HIGH (ref 3.3–19.4)
Kappa, lambda light chain ratio: 0.49 (ref 0.26–1.65)
Lambda free light chains: 61.8 mg/L — ABNORMAL HIGH (ref 5.7–26.3)

## 2020-04-12 ENCOUNTER — Ambulatory Visit: Payer: Medicare Other | Admitting: Rheumatology

## 2020-04-12 LAB — COMP PANEL: LEUKEMIA/LYMPHOMA

## 2020-04-12 LAB — MULTIPLE MYELOMA PANEL, SERUM
Albumin SerPl Elph-Mcnc: 3.3 g/dL (ref 2.9–4.4)
Albumin/Glob SerPl: 0.7 (ref 0.7–1.7)
Alpha 1: 0.3 g/dL (ref 0.0–0.4)
Alpha2 Glob SerPl Elph-Mcnc: 0.9 g/dL (ref 0.4–1.0)
B-Globulin SerPl Elph-Mcnc: 2.9 g/dL — ABNORMAL HIGH (ref 0.7–1.3)
Gamma Glob SerPl Elph-Mcnc: 0.8 g/dL (ref 0.4–1.8)
Globulin, Total: 4.9 g/dL — ABNORMAL HIGH (ref 2.2–3.9)
IgA: 2307 mg/dL — ABNORMAL HIGH (ref 61–437)
IgG (Immunoglobin G), Serum: 988 mg/dL (ref 603–1613)
IgM (Immunoglobulin M), Srm: 93 mg/dL (ref 20–172)
M Protein SerPl Elph-Mcnc: 2.1 g/dL — ABNORMAL HIGH
Total Protein ELP: 8.2 g/dL (ref 6.0–8.5)

## 2020-04-12 LAB — PROTEIN ELECTRO, RANDOM URINE
Albumin ELP, Urine: 32.3 %
Alpha-1-Globulin, U: 4.2 %
Alpha-2-Globulin, U: 21.2 %
Beta Globulin, U: 23.7 %
Gamma Globulin, U: 18.7 %
Total Protein, Urine: 31.3 mg/dL

## 2020-04-13 ENCOUNTER — Telehealth: Payer: Self-pay

## 2020-04-13 DIAGNOSIS — R778 Other specified abnormalities of plasma proteins: Secondary | ICD-10-CM

## 2020-04-13 NOTE — Telephone Encounter (Signed)
Spoke to patient and he is ok to proceed with bone marrow biospy, but wants it to be cleared by insurance first. Contacted pts insurance and they said they needed authorization from patient before disclosing information. Patient was notified and he will call and let us know what insurance says.

## 2020-04-13 NOTE — Telephone Encounter (Signed)
Spoke with Eric Cruz in [place] and she states BM bx requires no auth. Patient was notified and request for biopsy was faxed to specialized scheduling.

## 2020-04-13 NOTE — Telephone Encounter (Signed)
-----  Message from Earlie Server, MD sent at 04/12/2020  7:06 PM EST ----- Let me know that his blood work confirmed concerning level of protein in the blood. I recommend him to get bone marrow biopsy. Please arrange.  Please adjust follow up visit with me to be 1 week after bone marrow biopsy. If he wants more discussion of his blood work before proceeding BM biopsy, please offer him virtual visit.

## 2020-04-14 ENCOUNTER — Other Ambulatory Visit: Payer: Self-pay | Admitting: Radiology

## 2020-04-14 NOTE — Progress Notes (Signed)
Patient on schedule for BMB 1/13, called and spoke with patient on phone and made aware to be here @ 0730, NPO after MN prior to  Procedure and driver for discharge post procedure/recovery. Stated understanding.

## 2020-04-14 NOTE — H&P (Signed)
Chief Complaint: Patient was seen in consultation today for bone marrow biopsy and aspiration.   Referring Physician(s): Earlie Server  Supervising Physician: Markus Daft  Patient Status: [facility] - Out-pt  History of Present Illness: Eric Cruz is a 70 y.o. male with a medical history significant for BPH, prostate cancer, depression, gout, HTN, sleep apnea and rheumatoid arthritis. He was referred to oncology for evaluation of abnormal SPEP results and anemia. Additional labs ordered by oncology revealed concerning levels of protein in the blood.   Interventional Radiology has been asked to evaluate this patient for an image-guided bone marrow biopsy and aspiration for further work up.    Past Medical History:  Diagnosis Date  . Arthritis   . BPH (benign prostatic hyperplasia)   . Cancer [facility])    Prostate: states he had a positive biopsy but had another one a year later and it was gone.   . Depression   . Dizziness   . GERD (gastroesophageal reflux disease)   . Gout   . High cholesterol   . Hypertension   . Sleep apnea    has cpap - not currently wearing  . Varicose veins     Past Surgical History:  Procedure Laterality Date  . ANTERIOR CERVICAL DECOMP/DISCECTOMY FUSION N/A 02/21/2018   Procedure: Cervical Five-Six Cervical Six-Seven Anterior cervical decompression/discectomy/fusion;  Surgeon: Ashok Pall, MD;  Location: [place];  Service: Neurosurgery;  Laterality: N/A;  Cervical Five-Six Cervical Six-Seven Anterior cervical decompression/discectomy/fusion  . BIOPSY  10/09/2019   Procedure: BIOPSY;  Surgeon: Ronnette Juniper, MD;  [facility];  Service: Gastroenterology;;  . BLADDER SURGERY    . CARPAL TUNNEL RELEASE Right 02/11/2019   Procedure: Right Carpal Tunnel Wound Irrigation;  Surgeon: Ashok Pall, MD;  Location: [place];  Service: Neurosurgery;  Laterality: Right;  Right Carpal tunnel wound exploration/wash out  . CARPAL TUNNEL RELEASE Bilateral   .  CHOLECYSTECTOMY  11/22/2012  . CHOLECYSTECTOMY  11/22/2012   Procedure: LAPAROSCOPIC CHOLECYSTECTOMY;  Surgeon: Gwenyth Ober, MD;  Location: [place];  Service: General;;  . COLONOSCOPY    . COLONOSCOPY WITH PROPOFOL N/A 10/09/2019   Procedure: COLONOSCOPY WITH PROPOFOL;  Surgeon: Ronnette Juniper, MD;  [facility];  Service: Gastroenterology;  Laterality: N/A;  . ESOPHAGOGASTRODUODENOSCOPY (EGD) WITH PROPOFOL N/A 10/09/2019   Procedure: ESOPHAGOGASTRODUODENOSCOPY (EGD) WITH PROPOFOL;  Surgeon: Ronnette Juniper, MD;  [facility];  Service: Gastroenterology;  Laterality: N/A;  . POLYPECTOMY  10/09/2019   Procedure: POLYPECTOMY;  Surgeon: Ronnette Juniper, MD;  [facility];  Service: Gastroenterology;;    Allergies: Other and Statins  Medications: Prior to Admission medications   Medication Sig Start Date End Date Taking? Authorizing Provider  Ascorbic Acid (VITAMIN C PO) Take by mouth daily.    [provider]  ascorbic acid (VITAMIN C) 500 MG tablet Take 500 mg by mouth daily.    [provider]  calcium-vitamin D (OSCAL WITH D) 500-200 MG-UNIT tablet Take 1 tablet by mouth daily.    [provider]  cyanocobalamin 2000 MCG tablet Take 2,000 mcg by mouth daily.    [provider]  ferrous sulfate 325 (65 FE) MG EC tablet Take 325 mg by mouth daily with breakfast.    [provider]  finasteride (PROSCAR) 5 MG tablet Take 5 mg by mouth daily.    [provider]  folic acid (FOLVITE) 1 MG tablet Take 2 tablets (2 mg total) by mouth daily. Patient not taking: Reported on 04/08/2020 04/07/20  Bo Merino, MD  methotrexate (RHEUMATREX) 2.5 MG tablet Take 6 tabs po once weekly for 2 weeks, if labs are stable increase to 8 tabs po once weekly. Caution:Chemotherapy. Protect from light. Patient not taking: Reported on 04/08/2020 04/07/20   Bo Merino, MD  Multiple Vitamins-Minerals (MULTIVITAMIN WITH MINERALS) tablet Take 1  tablet by mouth in the morning and at bedtime.    [provider]  predniSONE (DELTASONE) 5 MG tablet Take 2 tabs po qd x 2 weeks, take 1 tab po qd x 2 weeks and then take 0.5 tab po qd x 2 weeks. 04/07/20   Bo Merino, MD  tamsulosin (FLOMAX) 0.4 MG CAPS capsule Take 0.4 mg by mouth daily after supper.    [provider]  traMADol (ULTRAM) 50 MG tablet TAKE 1 TO 2 TABLETS BY MOUTH ONCE DAILY AS NEEDED Patient not taking: No sig reported 02/11/20   Carole Civil, MD  VITAMIN D PO Take 50 mcg by mouth daily.    [provider]  Vitamins A & D (VITAMIN A & D) ointment Apply 1 application topically daily as needed for dry skin.    [provider]     Family History  Problem Relation Age of Onset  . Hypertension Mother   . Diabetes Mother   . Multiple myeloma Mother   . Hypertension Father   . Diabetes Father   . Healthy Son   . Healthy Daughter     Social History   Socioeconomic History  . Marital status: Married    Spouse name: Not on file  . Number of children: Not on file  . Years of education: Not on file  . Highest education level: Not on file  Occupational History  . Not on file  Tobacco Use  . Smoking status: Former Smoker    Quit date: 04/12/1998    Years since quitting: 22.0  . Smokeless tobacco: Never Used  Vaping Use  . Vaping Use: Never used  Substance and Sexual Activity  . Alcohol use: No  . Drug use: No  . Sexual activity: Not on file  Other Topics Concern  . Not on file  Social History Narrative  . Not on file   Social Determinants of Health   Financial Resource Strain: Not on file  Food Insecurity: Not on file  Transportation Needs: Not on file  Physical Activity: Not on file  Stress: Not on file  Social Connections: Not on file    Review of Systems: A 12 point ROS discussed and pertinent positives are indicated in the HPI above.  All other systems are negative.  Review of Systems  Constitutional:  Negative for appetite change and fatigue.  Respiratory: Negative for cough and shortness of breath.   Cardiovascular: Negative for chest pain and leg swelling.  Gastrointestinal: Negative for abdominal pain, diarrhea, nausea and vomiting.  Musculoskeletal: Negative for back pain.  Neurological: Negative for headaches.    Vital Signs: BP (!) 161/100   Pulse 80   Temp 99 F (37.2 C) (Oral)   Resp 20   Ht '5\' 8"'  (1.727 m)   Wt 283 lb (128.4 kg)   SpO2 96%   BMI 43.03 kg/m   Physical Exam Constitutional:      General: He is not in acute distress.    Appearance: He is obese.  HENT:     Mouth/Throat:     Mouth: Mucous membranes are moist.     Pharynx: Oropharynx is clear.  Cardiovascular:  Rate and Rhythm: Normal rate and regular rhythm.     Pulses: Normal pulses.     Heart sounds: Normal heart sounds.  Pulmonary:     Effort: Pulmonary effort is normal.     Breath sounds: Normal breath sounds.  Abdominal:     General: Bowel sounds are normal.     Palpations: Abdomen is soft.  Skin:    General: Skin is warm and dry.     Comments: Left axillary cyst-like lesion that is firm, tender and slightly erythematous. No drainage observed. Approximately the size of a quarter.   Neurological:     Mental Status: He is alert and oriented to person, place, and time.  Psychiatric:        Mood and Affect: Mood normal.        Behavior: Behavior normal.        Thought Content: Thought content normal.        Judgment: Judgment normal.     Imaging: DG Bone Survey Met  Result Date: 04/08/2020 CLINICAL DATA:  Abnormal serum protein electrophoresis. EXAM: METASTATIC BONE SURVEY COMPARISON:  Prior cervical spine, LEFT shoulder, knee, LEFT hip and chest radiographs. FINDINGS: No focal bony lesions are identified. No lytic bony lesions are noted. No acute bony abnormalities are present. Degenerative changes in the hips and spine are noted. The lungs are clear. Aortic atherosclerotic  calcifications are noted. IMPRESSION: 1. No focal bony lesions or evidence of bony metastatic disease. 2.  Aortic Atherosclerosis (ICD10-I70.0). Electronically Signed   By: Margarette Canada M.D.   On: 04/08/2020 19:39    Labs:  CBC: Recent Labs    03/04/20 1006 04/08/20 1509 04/15/20 0801  WBC 11.2* 10.0 12.0*  HGB 10.0* 10.9* 11.0*  HCT 30.3* 34.5* 35.7*  PLT 333 272 321    COAGS: No results for input(s): INR, APTT in the last 8760 hours.  BMP: Recent Labs    03/04/20 1006  NA 137  K 4.4  CL 102  CO2 25  GLUCOSE 88  BUN 16  CALCIUM 9.7  CREATININE 0.88  GFRNONAA 89  GFRAA 103    LIVER FUNCTION TESTS: Recent Labs    03/04/20 1006  BILITOT 0.3  AST 14  ALT 4*  PROT 8.2*  8.3*    TUMOR MARKERS: No results for input(s): AFPTM, CEA, CA199, CHROMGRNA in the last 8760 hours.  Assessment and Plan:  Abnormal SPEP results; anemia: Eric Cruz, 70 year old male, presents today to the [facility] Interventional Radiology department for an image-guided bone marrow biopsy and aspiration.  Risks and benefits of this procedure were discussed with the patient and/or patient's family including, but not limited to bleeding, infection, damage to adjacent structures or low yield requiring additional tests.  All of the questions were answered and there is agreement to proceed.  The patient has been NPO. Vitals have been reviewed. Labs are pending but will be reviewed prior to the start of the procedure.   Consent signed and in chart.  Thank you for this interesting consult.  I greatly enjoyed meeting Eric Cruz and look forward to participating in their care.  A copy of this report was sent to the requesting provider on this date.  Electronically Signed: Soyla Dryer, AGACNP-BC [phone] 04/15/2020, 8:24 AM   I spent a total of  30 Minutes   in face to face in clinical consultation, greater than 50% of which was  counseling/coordinating care for bone marrow biopsy and aspiration.

## 2020-04-14 NOTE — Telephone Encounter (Signed)
Pt scheduled for BMB on Thurs 1/13 @ 8:30am with arrival time of 7:30 am. He has already spoken with IR nurse regarding instructions prior to procedure. Pt to keep appt with MD on 1/20 for results.

## 2020-04-15 ENCOUNTER — Other Ambulatory Visit: Payer: Self-pay

## 2020-04-15 ENCOUNTER — Ambulatory Visit
Admission: RE | Admit: 2020-04-15 | Discharge: 2020-04-15 | Disposition: A | Discharge status: Home / Self Care | Payer: Medicare Other | Source: Ambulatory Visit | Attending: Oncology | Admitting: Oncology

## 2020-04-15 DIAGNOSIS — R778 Other specified abnormalities of plasma proteins: Secondary | ICD-10-CM | POA: Diagnosis not present

## 2020-04-15 DIAGNOSIS — C9000 Multiple myeloma not having achieved remission: Secondary | ICD-10-CM | POA: Diagnosis not present

## 2020-04-15 DIAGNOSIS — Z79899 Other long term (current) drug therapy: Secondary | ICD-10-CM | POA: Diagnosis not present

## 2020-04-15 DIAGNOSIS — Z87891 Personal history of nicotine dependence: Secondary | ICD-10-CM | POA: Insufficient documentation

## 2020-04-15 DIAGNOSIS — R779 Abnormality of plasma protein, unspecified: Secondary | ICD-10-CM | POA: Diagnosis not present

## 2020-04-15 DIAGNOSIS — D72828 Other elevated white blood cell count: Secondary | ICD-10-CM | POA: Diagnosis not present

## 2020-04-15 DIAGNOSIS — Z8546 Personal history of malignant neoplasm of prostate: Secondary | ICD-10-CM | POA: Diagnosis not present

## 2020-04-15 DIAGNOSIS — D6489 Other specified anemias: Secondary | ICD-10-CM | POA: Diagnosis not present

## 2020-04-15 DIAGNOSIS — D649 Anemia, unspecified: Secondary | ICD-10-CM | POA: Insufficient documentation

## 2020-04-15 DIAGNOSIS — M069 Rheumatoid arthritis, unspecified: Secondary | ICD-10-CM | POA: Diagnosis not present

## 2020-04-15 DIAGNOSIS — D47Z9 Other specified neoplasms of uncertain behavior of lymphoid, hematopoietic and related tissue: Secondary | ICD-10-CM | POA: Diagnosis not present

## 2020-04-15 LAB — CBC WITH DIFFERENTIAL/PLATELET
Abs Immature Granulocytes: 0.08 10*3/uL — ABNORMAL HIGH (ref 0.00–0.07)
Basophils Absolute: 0 10*3/uL (ref 0.0–0.1)
Basophils Relative: 0 %
Eosinophils Absolute: 0.1 10*3/uL (ref 0.0–0.5)
Eosinophils Relative: 1 %
HCT: 35.7 % — ABNORMAL LOW (ref 39.0–52.0)
Hemoglobin: 11 g/dL — ABNORMAL LOW (ref 13.0–17.0)
Immature Granulocytes: 1 %
Lymphocytes Relative: 26 %
Lymphs Abs: 3.1 10*3/uL (ref 0.7–4.0)
MCH: 28.1 pg (ref 26.0–34.0)
MCHC: 30.8 g/dL (ref 30.0–36.0)
MCV: 91.1 fL (ref 80.0–100.0)
Monocytes Absolute: 0.6 10*3/uL (ref 0.1–1.0)
Monocytes Relative: 5 %
Neutro Abs: 8.1 10*3/uL — ABNORMAL HIGH (ref 1.7–7.7)
Neutrophils Relative %: 67 %
Platelets: 321 10*3/uL (ref 150–400)
RBC: 3.92 MIL/uL — ABNORMAL LOW (ref 4.22–5.81)
RDW: 17.2 % — ABNORMAL HIGH (ref 11.5–15.5)
WBC: 12 10*3/uL — ABNORMAL HIGH (ref 4.0–10.5)
nRBC: 0 % (ref 0.0–0.2)

## 2020-04-15 MED ORDER — SODIUM CHLORIDE 0.9 % IV SOLN: INTRAVENOUS | Status: DC

## 2020-04-15 MED ORDER — FENTANYL CITRATE (PF) 100 MCG/2ML IJ SOLN
INTRAMUSCULAR | Status: AC | PRN
Start: 2020-04-15 — End: 2020-04-15
  Administered 2020-04-15 (×2): 50 ug via INTRAVENOUS

## 2020-04-15 MED ORDER — MIDAZOLAM HCL 2 MG/2ML IJ SOLN
INTRAMUSCULAR | Status: AC | PRN
  Administered 2020-04-15 (×2): 1 mg via INTRAVENOUS

## 2020-04-15 MED ORDER — HEPARIN SOD (PORK) LOCK FLUSH 100 UNIT/ML IV SOLN
INTRAVENOUS | Status: AC
  Filled 2020-04-15: qty 5

## 2020-04-15 MED ORDER — FENTANYL CITRATE (PF) 100 MCG/2ML IJ SOLN
INTRAMUSCULAR | Status: AC
  Filled 2020-04-15: qty 2

## 2020-04-15 MED ORDER — MIDAZOLAM HCL 2 MG/2ML IJ SOLN
INTRAMUSCULAR | Status: AC
  Filled 2020-04-15: qty 2

## 2020-04-15 NOTE — Progress Notes (Signed)
Patient clinically stable post BMB per DR Anselm Pancoast, tolerated well. Dressing dry/intact to sacral area. Awake/alert and oriented post procedure. Denies complaints at this time. Received Versed 2 mg along with Fentanyl 100 mcg IV for procedure. Report given to Annabell Sabal post procedure.

## 2020-04-15 NOTE — Procedures (Signed)
Interventional Radiology Procedure:   Indications: Abnormal SPEP and anemia.  Procedure: CT guided bone marrow biopsy  Findings: 2 aspirates and 1 core from right ilium  Complications: None     EBL: Minimal, less than 10 ml  Plan: Discharge to home in one hour.   Avyn Aden R. Anselm Pancoast, MD  Pager: [phone]

## 2020-04-19 ENCOUNTER — Ambulatory Visit: Payer: Medicare Other | Admitting: Orthopedic Surgery

## 2020-04-22 ENCOUNTER — Inpatient Hospital Stay: Payer: Medicare Other

## 2020-04-22 ENCOUNTER — Inpatient Hospital Stay: Payer: Medicare Other | Admitting: Oncology

## 2020-04-22 ENCOUNTER — Encounter: Payer: Self-pay | Admitting: Oncology

## 2020-04-22 ENCOUNTER — Encounter (INDEPENDENT_AMBULATORY_CARE_PROVIDER_SITE_OTHER): Payer: Self-pay

## 2020-04-22 VITALS — BP 123/85 | HR 111 | Temp 98.1°F | Resp 18 | Wt 281.7 lb

## 2020-04-22 DIAGNOSIS — Z833 Family history of diabetes mellitus: Secondary | ICD-10-CM | POA: Diagnosis not present

## 2020-04-22 DIAGNOSIS — E7800 Pure hypercholesterolemia, unspecified: Secondary | ICD-10-CM | POA: Diagnosis not present

## 2020-04-22 DIAGNOSIS — M069 Rheumatoid arthritis, unspecified: Secondary | ICD-10-CM

## 2020-04-22 DIAGNOSIS — Z7189 Other specified counseling: Secondary | ICD-10-CM | POA: Diagnosis not present

## 2020-04-22 DIAGNOSIS — Z8249 Family history of ischemic heart disease and other diseases of the circulatory system: Secondary | ICD-10-CM | POA: Diagnosis not present

## 2020-04-22 DIAGNOSIS — K219 Gastro-esophageal reflux disease without esophagitis: Secondary | ICD-10-CM | POA: Diagnosis not present

## 2020-04-22 DIAGNOSIS — Z87891 Personal history of nicotine dependence: Secondary | ICD-10-CM | POA: Diagnosis not present

## 2020-04-22 DIAGNOSIS — C9000 Multiple myeloma not having achieved remission: Secondary | ICD-10-CM

## 2020-04-22 DIAGNOSIS — Z79899 Other long term (current) drug therapy: Secondary | ICD-10-CM | POA: Diagnosis not present

## 2020-04-22 DIAGNOSIS — G4730 Sleep apnea, unspecified: Secondary | ICD-10-CM | POA: Diagnosis not present

## 2020-04-22 DIAGNOSIS — D72829 Elevated white blood cell count, unspecified: Secondary | ICD-10-CM | POA: Diagnosis not present

## 2020-04-22 DIAGNOSIS — D649 Anemia, unspecified: Secondary | ICD-10-CM | POA: Diagnosis not present

## 2020-04-22 DIAGNOSIS — I10 Essential (primary) hypertension: Secondary | ICD-10-CM | POA: Diagnosis not present

## 2020-04-22 DIAGNOSIS — Z7952 Long term (current) use of systemic steroids: Secondary | ICD-10-CM | POA: Diagnosis not present

## 2020-04-22 LAB — COMPREHENSIVE METABOLIC PANEL
ALT: 10 U/L (ref 0–44)
AST: 18 U/L (ref 15–41)
Albumin: 3.1 g/dL — ABNORMAL LOW (ref 3.5–5.0)
Alkaline Phosphatase: 69 U/L (ref 38–126)
Anion gap: 7 (ref 5–15)
BUN: 18 mg/dL (ref 8–23)
CO2: 28 mmol/L (ref 22–32)
Calcium: 8.9 mg/dL (ref 8.9–10.3)
Chloride: 100 mmol/L (ref 98–111)
Creatinine, Ser: 0.82 mg/dL (ref 0.61–1.24)
GFR, Estimated: >60 mL/min (ref >60)
Glucose, Bld: 137 mg/dL — ABNORMAL HIGH (ref 70–99)
Potassium: 3.5 mmol/L (ref 3.5–5.1)
Sodium: 135 mmol/L (ref 135–145)
Total Bilirubin: 0.3 mg/dL (ref 0.3–1.2)
Total Protein: 8.2 g/dL — ABNORMAL HIGH (ref 6.5–8.1)

## 2020-04-22 LAB — LACTATE DEHYDROGENASE: LDH: 136 U/L (ref 98–192)

## 2020-04-22 NOTE — Progress Notes (Signed)
Hematology/Oncology follow-up note [facility] [phone] [phone]   Patient Care Team: Seward Carol, MD as PCP - General (Internal Medicine)  REFERRING PROVIDER: Seward Carol, MD  CHIEF COMPLAINTS/REASON FOR VISIT:  Follow-up for multiple myeloma  HISTORY OF PRESENTING ILLNESS:  Eric Cruz is a 70 y.o. male who was seen in consultation at the request of Seward Carol, MD for evaluation of abnormal SPEP results, anemia. .   Patient recently had work up done at rheumatologist's office and was diagnosed with rheumatoid arthritis and patient was recommended to start MTX. He has not started yet Labs reviewed,  03/04/20 SPEP showed abnormal protein band of 1.7g/dl.,   , and IFE showed IgA Lamda monoclonal protein.  He has had weight loss last year, no weight loss and some weight gain during the past 6 months.   Multiple join pain.    INTERVAL HISTORY Eric Cruz is a 70 y.o. male who has above history reviewed by me today presents for follow up visit for management of multiple myeloma Problems and complaints are listed below: Patient had blood work done during last visit.  In addition, patient has had bone marrow biopsy done per my recommendation and presents today to discuss results. Rheumatoid arthritis, he is currently on methotrexate and prednisone.  He reports chronic joint ache symptoms are slightly better  Review of Systems  Constitutional: Positive for fatigue. Negative for appetite change, chills, fever and unexpected weight change.  HENT:   Negative for hearing loss and voice change.   Eyes: Negative for eye problems and icterus.  Respiratory: Negative for chest tightness, cough and shortness of breath.   Cardiovascular: Negative for chest pain and leg swelling.  Gastrointestinal: Negative for abdominal distention and abdominal pain.  Endocrine: Negative for hot flashes.  Genitourinary: Negative for difficulty  urinating, dysuria and frequency.   Musculoskeletal: Positive for arthralgias and back pain.  Skin: Negative for itching and rash.  Neurological: Negative for light-headedness and numbness.  Hematological: Negative for adenopathy. Does not bruise/bleed easily.  Psychiatric/Behavioral: Negative for confusion.     MEDICAL HISTORY:  Past Medical History:  Diagnosis Date  . Arthritis   . BPH (benign prostatic hyperplasia)   . Cancer [facility])    Prostate: states he had a positive biopsy but had another one a year later and it was gone.   . Depression   . Dizziness   . GERD (gastroesophageal reflux disease)   . Gout   . High cholesterol   . Hypertension   . Sleep apnea    has cpap - not currently wearing  . Varicose veins     SURGICAL HISTORY: Past Surgical History:  Procedure Laterality Date  . ANTERIOR CERVICAL DECOMP/DISCECTOMY FUSION N/A 02/21/2018   Procedure: Cervical Five-Six Cervical Six-Seven Anterior cervical decompression/discectomy/fusion;  Surgeon: Ashok Pall, MD;  Location: [place];  Service: Neurosurgery;  Laterality: N/A;  Cervical Five-Six Cervical Six-Seven Anterior cervical decompression/discectomy/fusion  . BIOPSY  10/09/2019   Procedure: BIOPSY;  Surgeon: Ronnette Juniper, MD;  Location: [facility];  Service: Gastroenterology;;  . BLADDER SURGERY    . CARPAL TUNNEL RELEASE Right 02/11/2019   Procedure: Right Carpal Tunnel Wound Irrigation;  Surgeon: Ashok Pall, MD;  Location: [place];  Service: Neurosurgery;  Laterality: Right;  Right Carpal tunnel wound exploration/wash out  . CARPAL TUNNEL RELEASE Bilateral   . CHOLECYSTECTOMY  11/22/2012  . CHOLECYSTECTOMY  11/22/2012   Procedure: LAPAROSCOPIC CHOLECYSTECTOMY;  Surgeon: Gwenyth Ober, MD;  Location: [place];  Service: General;;  . COLONOSCOPY    . COLONOSCOPY WITH PROPOFOL N/A 10/09/2019   Procedure: COLONOSCOPY WITH PROPOFOL;  Surgeon: Ronnette Juniper, MD;  Location: [facility];  Service: Gastroenterology;   Laterality: N/A;  . ESOPHAGOGASTRODUODENOSCOPY (EGD) WITH PROPOFOL N/A 10/09/2019   Procedure: ESOPHAGOGASTRODUODENOSCOPY (EGD) WITH PROPOFOL;  Surgeon: Ronnette Juniper, MD;  Location: [facility];  Service: Gastroenterology;  Laterality: N/A;  . POLYPECTOMY  10/09/2019   Procedure: POLYPECTOMY;  Surgeon: Ronnette Juniper, MD;  Location: [facility];  Service: Gastroenterology;;    SOCIAL HISTORY: Social History   Socioeconomic History  . Marital status: Married    Spouse name: Not on file  . Number of children: Not on file  . Years of education: Not on file  . Highest education level: Not on file  Occupational History  . Not on file  Tobacco Use  . Smoking status: Former Smoker    Quit date: 04/12/1998    Years since quitting: 22.0  . Smokeless tobacco: Never Used  Vaping Use  . Vaping Use: Never used  Substance and Sexual Activity  . Alcohol use: No  . Drug use: No  . Sexual activity: Not on file  Other Topics Concern  . Not on file  Social History Narrative  . Not on file   Social Determinants of Health   Financial Resource Strain: Not on file  Food Insecurity: Not on file  Transportation Needs: Not on file  Physical Activity: Not on file  Stress: Not on file  Social Connections: Not on file  Intimate Partner Violence: Not on file    FAMILY HISTORY: Family History  Problem Relation Age of Onset  . Hypertension Mother   . Diabetes Mother   . Multiple myeloma Mother   . Hypertension Father   . Diabetes Father   . Healthy Son   . Healthy Daughter     ALLERGIES:  is allergic to other and statins.  MEDICATIONS:  Current Outpatient Medications  Medication Sig Dispense Refill  . ascorbic acid (VITAMIN C) 500 MG tablet Take 500 mg by mouth daily.    . calcium-vitamin D (OSCAL WITH D) 500-200 MG-UNIT tablet Take 1 tablet by mouth daily.    . cyanocobalamin 2000 MCG tablet Take 2,000 mcg by mouth daily.    . ferrous sulfate 325 (65 FE) MG EC tablet Take 325 mg by mouth  daily with breakfast.    . finasteride (PROSCAR) 5 MG tablet Take 5 mg by mouth daily.    . folic acid (FOLVITE) 1 MG tablet Take 2 tablets (2 mg total) by mouth daily. 180 tablet 2  . methotrexate (RHEUMATREX) 2.5 MG tablet Take 6 tabs po once weekly for 2 weeks, if labs are stable increase to 8 tabs po once weekly. Caution:Chemotherapy. Protect from light. 28 tablet 0  . Multiple Vitamins-Minerals (MULTIVITAMIN WITH MINERALS) tablet Take 1 tablet by mouth in the morning and at bedtime.    . predniSONE (DELTASONE) 5 MG tablet Take 2 tabs po qd x 2 weeks, take 1 tab po qd x 2 weeks and then take 0.5 tab po qd x 2 weeks. 49 tablet 0  . tamsulosin (FLOMAX) 0.4 MG CAPS capsule Take 0.4 mg by mouth daily after supper.    . traMADol (ULTRAM) 50 MG tablet TAKE 1 TO 2 TABLETS BY MOUTH ONCE DAILY AS NEEDED 20 tablet 0  . VITAMIN D PO Take 50 mcg by mouth daily.    . Vitamins A & D (VITAMIN A & D) ointment  Apply 1 application topically daily as needed for dry skin.     No current facility-administered medications for this visit.     PHYSICAL EXAMINATION: ECOG PERFORMANCE STATUS: 1 - Symptomatic but completely ambulatory Vitals:   04/22/20 1041  BP: 123/85  Pulse: (!) 111  Resp: 18  Temp: 98.1 F (36.7 C)   Filed Weights   04/22/20 1041  Weight: 281 lb 11.2 oz (127.8 kg)    Physical Exam Constitutional:      General: He is not in acute distress.    Appearance: He is obese.     Comments: Patient sits in the wheelchair.  HENT:     Head: Normocephalic and atraumatic.  Eyes:     General: No scleral icterus. Cardiovascular:     Rate and Rhythm: Normal rate and regular rhythm.     Heart sounds: Normal heart sounds.  Pulmonary:     Effort: Pulmonary effort is normal. No respiratory distress.     Breath sounds: No wheezing.  Abdominal:     General: Bowel sounds are normal. There is no distension.     Palpations: Abdomen is soft.  Musculoskeletal:        General: No deformity. Normal  range of motion.     Cervical back: Normal range of motion and neck supple.     Comments: Trace edema, bilateral low extremities.   Skin:    General: Skin is warm and dry.     Findings: No erythema or rash.  Neurological:     Mental Status: He is alert and oriented to person, place, and time. Mental status is at baseline.     Cranial Nerves: No cranial nerve deficit.     Coordination: Coordination normal.  Psychiatric:        Mood and Affect: Mood normal.      LABORATORY DATA:  I have reviewed the data as listed Lab Results  Component Value Date   WBC 12.0 (H) 04/15/2020   HGB 11.0 (L) 04/15/2020   HCT 35.7 (L) 04/15/2020   MCV 91.1 04/15/2020   PLT 321 04/15/2020   Recent Labs    03/04/20 1006 04/22/20 1144  NA 137 135  K 4.4 3.5  CL 102 100  CO2 25 28  GLUCOSE 88 137*  BUN 16 18  CREATININE 0.88 0.82  CALCIUM 9.7 8.9  GFRNONAA 89 >60  GFRAA 103  --   PROT 8.2*  8.3* 8.2*  ALBUMIN  --  3.1*  AST 14 18  ALT 4* 10  ALKPHOS  --  69  BILITOT 0.3 0.3   Iron/TIBC/Ferritin/ %Sat    Component Value Date/Time   IRON 31 (L) 04/08/2020 1509   TIBC 259 04/08/2020 1509   FERRITIN 452 (H) 04/08/2020 1509   IRONPCTSAT 12 (L) 04/08/2020 1509      RADIOGRAPHIC STUDIES: I have personally reviewed the radiological images as listed and agreed with the findings in the report.  DG Bone Survey Met  Result Date: 04/08/2020 CLINICAL DATA:  Abnormal serum protein electrophoresis. EXAM: METASTATIC BONE SURVEY COMPARISON:  Prior cervical spine, LEFT shoulder, knee, LEFT hip and chest radiographs. FINDINGS: No focal bony lesions are identified. No lytic bony lesions are noted. No acute bony abnormalities are present. Degenerative changes in the hips and spine are noted. The lungs are clear. Aortic atherosclerotic calcifications are noted. IMPRESSION: 1. No focal bony lesions or evidence of bony metastatic disease. 2.  Aortic Atherosclerosis (ICD10-I70.0). Electronically Signed   By:  Cleatis Polka.D.  On: 04/08/2020 19:39   CT BONE MARROW BIOPSY & ASPIRATION  Result Date: 04/15/2020 INDICATION: 70 year old with abnormal SPEP. EXAM: CT GUIDED BONE MARROW ASPIRATES AND BIOPSY Physician: Stephan Minister. Anselm Pancoast, MD MEDICATIONS: None. ANESTHESIA/SEDATION: Fentanyl 100 mcg IV; Versed 2.0 mg IV Moderate Sedation Time:  13 minutes The patient was continuously monitored during the procedure by the interventional radiology nurse under my direct supervision. COMPLICATIONS: None immediate. PROCEDURE: The procedure was explained to the patient. The risks and benefits of the procedure were discussed and the patient's questions were addressed. Informed consent was obtained from the patient. The patient was placed prone on CT table. Images of the pelvis were obtained. The right side of back was prepped and draped in sterile fashion. The skin and right posterior ilium were anesthetized with 1% lidocaine. 11 gauge bone needle was directed into the right ilium with CT guidance. Two aspirates and one core biopsy were obtained. Bandage placed over the puncture site. IMPRESSION: CT guided bone marrow aspiration and core biopsy. Electronically Signed   By: Markus Daft M.D.   On: 04/15/2020 09:50     ASSESSMENT & PLAN:  1. Multiple myeloma not having achieved remission ([place])   2. Rheumatoid arthritis, involving unspecified site, unspecified whether rheumatoid factor present ([place])   3. Goals of care, counseling/discussion    #IgA lambda multiple myeloma, M protein 2.1, normal free light chain ratio Bone marrow biopsy was reviewed and discussed with patient.  27% plasma cell, cytogenetics and myeloma FISH panel are pending. Check CMP, LDH.  Patient has normal kidney function, calcium level, albumin is decreased at 3.1, slightly elevated beta microglobulin.  Stage II, anemia with hemoglobin above 10. Skeletal survey showed no bone lesions.  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 Recommend to  obtain PET scan for further evaluation of bone lesions Observation for now.  Rheumatoid arthritis, patient just started on treatments with methotrexate and prednisone. I will schedule the PET scan in 4 weeks to allow him to focus on rheumatoid arthritis treatments.  Possible avoid nonspecific findings due to chronic pronation from rheumatoid arthritis  Orders Placed This Encounter  Procedures  . NM PET Image Initial (PI) Whole Body    Standing Status:   Future    Standing Expiration Date:   04/22/2021    Order Specific Question:   If indicated for the ordered procedure, I authorize the administration of a radiopharmaceutical per Radiology protocol    Answer:   Yes    Order Specific Question:   Preferred imaging location?    Answer:   [facility]  . Comprehensive metabolic panel    Standing Status:   Future    Number of Occurrences:   1    Standing Expiration Date:   04/22/2021  . Lactate dehydrogenase    Standing Status:   Future    Number of Occurrences:   1    Standing Expiration Date:   04/22/2021    All questions were answered. The patient knows to call the clinic with any problems questions or concerns.  Cc Seward Carol, MD  Return of visit: After PET scan  Earlie Server, MD, PhD 04/22/2020

## 2020-04-22 NOTE — Progress Notes (Signed)
Patient here for follow up. Here for bone marrow biopsy results.

## 2020-04-26 ENCOUNTER — Encounter (HOSPITAL_COMMUNITY): Payer: Self-pay | Admitting: Oncology

## 2020-04-26 NOTE — Progress Notes (Signed)
Office Visit Note  Patient: Eric Cruz             Date of Birth: 1951/01/28           MRN: [id number]             PCP: Seward Carol, MD Referring: Seward Carol, MD Visit Date: 05/10/2020 Occupation: '@GUAROCC' @  Subjective:  Other (Bilateral hand/wrist pain, knee pain )   History of Present Illness: Eric Cruz is a 70 y.o. male with seropositive rheumatoid arthritis.  He was accompanied by his wife today.  He states he continues to have pain and discomfort in his bilateral hands and wrist joints.  He is also having pain in his knee joints.  He has been taking methotrexate 6 tablets p.o. weekly.  He had lab work but he did not increase the methotrexate after that.  He has been on tapering schedule of prednisone.  Activities of Daily Living:  Patient reports morning stiffness for 15  minutes-3 hours. Patient Reports nocturnal pain.  Difficulty dressing/grooming: Denies Difficulty climbing stairs: Reports Difficulty getting out of chair: Reports Difficulty using hands for taps, buttons, cutlery, and/or writing: Reports  Review of Systems  Constitutional: Positive for fatigue.  HENT: Negative for mouth sores, mouth dryness and nose dryness.   Eyes: Negative for pain, itching and dryness.  Respiratory: Positive for shortness of breath. Negative for difficulty breathing.   Cardiovascular: Positive for chest pain. Negative for palpitations.  Gastrointestinal: Negative for blood in stool, constipation and diarrhea.  Endocrine: Negative for increased urination.  Genitourinary: Negative for difficulty urinating.  Musculoskeletal: Positive for arthralgias, joint pain, joint swelling, myalgias, morning stiffness, muscle tenderness and myalgias.  Skin: Negative for color change, rash and redness.  Allergic/Immunologic: Positive for susceptible to infections.  Neurological: Positive for numbness. Negative for dizziness, headaches and memory loss.  Hematological: Negative for  bruising/bleeding tendency.  Psychiatric/Behavioral: Negative for confusion.    PMFS History:  Patient Active Problem List   Diagnosis Date Noted  . Goals of care, counseling/discussion 04/22/2020  . Multiple myeloma not having achieved remission ([place]) 04/22/2020  . Rheumatoid arthritis ([place]) 04/07/2020  . High risk medication use 04/07/2020  . 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  . Synovitis of left knee 09/16/2019  . Wound infection after surgery 02/11/2019  . HNP (herniated nucleus pulposus) with myelopathy, cervical 02/21/2018  . Postop check 12/10/2012  . Symptomatic cholelithiasis 11/15/2012    Past Medical History:  Diagnosis Date  . Arthritis   . BPH (benign prostatic hyperplasia)   . Cancer [facility])    Prostate: states he had a positive biopsy but had another one a year later and it was gone.   . Depression   . Dizziness   . GERD (gastroesophageal reflux disease)   . Gout   . High cholesterol   . Hypertension   . Sleep apnea    has cpap - not currently wearing  . Varicose veins     Family History  Problem Relation Age of Onset  . Hypertension Mother   . Diabetes Mother   . Multiple myeloma Mother   . Hypertension Father   . Diabetes Father   . Healthy Son   . Healthy Daughter    Past Surgical History:  Procedure Laterality Date  . ANTERIOR CERVICAL DECOMP/DISCECTOMY FUSION N/A 02/21/2018   Procedure: Cervical Five-Six Cervical Six-Seven Anterior cervical decompression/discectomy/fusion;  Surgeon: Ashok Pall, MD;  Location: [place];  Service: Neurosurgery;  Laterality: N/A;  Cervical Five-Six Cervical  Six-Seven Anterior cervical decompression/discectomy/fusion  . BIOPSY  10/09/2019   Procedure: BIOPSY;  Surgeon: Ronnette Juniper, MD;  Location: [facility];  Service: Gastroenterology;;  . BLADDER SURGERY    . CARPAL TUNNEL RELEASE Right 02/11/2019   Procedure: Right Carpal Tunnel Wound Irrigation;  Surgeon: Ashok Pall, MD;  Location: [place];  Service:  Neurosurgery;  Laterality: Right;  Right Carpal tunnel wound exploration/wash out  . CARPAL TUNNEL RELEASE Bilateral   . CHOLECYSTECTOMY  11/22/2012  . CHOLECYSTECTOMY  11/22/2012   Procedure: LAPAROSCOPIC CHOLECYSTECTOMY;  Surgeon: Gwenyth Ober, MD;  Location: [place];  Service: General;;  . COLONOSCOPY    . COLONOSCOPY WITH PROPOFOL N/A 10/09/2019   Procedure: COLONOSCOPY WITH PROPOFOL;  Surgeon: Ronnette Juniper, MD;  Location: [facility];  Service: Gastroenterology;  Laterality: N/A;  . ESOPHAGOGASTRODUODENOSCOPY (EGD) WITH PROPOFOL N/A 10/09/2019   Procedure: ESOPHAGOGASTRODUODENOSCOPY (EGD) WITH PROPOFOL;  Surgeon: Ronnette Juniper, MD;  Location: [facility];  Service: Gastroenterology;  Laterality: N/A;  . POLYPECTOMY  10/09/2019   Procedure: POLYPECTOMY;  Surgeon: Ronnette Juniper, MD;  Location: [facility];  Service: Gastroenterology;;   Social History   Social History Narrative  . Not on file   Immunization History  Administered Date(s) Administered  . PFIZER(Purple Top)SARS-COV-2 Vaccination 03/04/2020, 04/05/2020     Objective: Vital Signs: BP 111/81 (BP Location: Left Arm, Patient Position: Sitting, Cuff Size: Large)   Pulse 97   Resp 17   Ht '5\' 8"'  (1.727 m)   Wt 278 lb 9.6 oz (126.4 kg)   BMI 42.36 kg/m    Physical Exam Vitals and nursing note reviewed.  Constitutional:      Appearance: He is well-developed and well-nourished.  HENT:     Head: Normocephalic and atraumatic.  Eyes:     Extraocular Movements: EOM normal.     Conjunctiva/sclera: Conjunctivae normal.     Pupils: Pupils are equal, round, and reactive to light.  Cardiovascular:     Rate and Rhythm: Normal rate and regular rhythm.     Heart sounds: Normal heart sounds.  Pulmonary:     Effort: Pulmonary effort is normal.     Breath sounds: Normal breath sounds.  Abdominal:     General: Bowel sounds are normal.     Palpations: Abdomen is soft.  Musculoskeletal:     Cervical back: Normal range of motion and neck  supple.  Skin:    General: Skin is warm and dry.     Capillary Refill: Capillary refill takes less than 2 seconds.  Neurological:     Mental Status: He is alert and oriented to person, place, and time.  Psychiatric:        Mood and Affect: Mood and affect normal.        Behavior: Behavior normal.      Musculoskeletal Exam: C-spine was in limited range of motion.  He had painful limited range of motion of his shoulder joints with abduction about 60 degrees.  His right elbow joint contracture.  He has limited range of motion of her wrist joints with tenderness.  He had tenderness over MCPs but no synovitis was noted.  He was found and swelling over his left knee joint.  He had no tenderness over ankles or MTPs.  CDAI Exam: CDAI Score: 9.2  Patient Global: 6 mm; Provider Global: 6 mm Swollen: 1 ; Tender: 7  Joint Exam 05/10/2020      Right  Left  Glenohumeral   Tender   Tender  MCP 2  Tender   Tender  MCP 3   Tender   Tender  Knee     Swollen Tender     Investigation: No additional findings.  Imaging: CT BONE MARROW BIOPSY & ASPIRATION  Result Date: 04/15/2020 INDICATION: 70 year old with abnormal SPEP. EXAM: CT GUIDED BONE MARROW ASPIRATES AND BIOPSY Physician: Stephan Minister. Anselm Pancoast, MD MEDICATIONS: None. ANESTHESIA/SEDATION: Fentanyl 100 mcg IV; Versed 2.0 mg IV Moderate Sedation Time:  13 minutes The patient was continuously monitored during the procedure by the interventional radiology nurse under my direct supervision. COMPLICATIONS: None immediate. PROCEDURE: The procedure was explained to the patient. The risks and benefits of the procedure were discussed and the patient's questions were addressed. Informed consent was obtained from the patient. The patient was placed prone on CT table. Images of the pelvis were obtained. The right side of back was prepped and draped in sterile fashion. The skin and right posterior ilium were anesthetized with 1% lidocaine. 11 gauge bone needle was  directed into the right ilium with CT guidance. Two aspirates and one core biopsy were obtained. Bandage placed over the puncture site. IMPRESSION: CT guided bone marrow aspiration and core biopsy. Electronically Signed   By: Markus Daft M.D.   On: 04/15/2020 09:50   DG Knee AP/LAT W/Sun[place] Left  Result Date: 05/03/2020 Images of the left knee Chronic knee pain X-rays show some narrowing of the medial side is equal to the narrowing of the lateral side lateral x-ray looks normal except for the narrowing medially The patella is centered Impression mild arthritis medial compartment of the left knee   Recent Labs: Lab Results  Component Value Date   WBC 12.0 (H) 04/15/2020   HGB 11.0 (L) 04/15/2020   PLT 321 04/15/2020   NA 135 04/22/2020   K 3.5 04/22/2020   CL 100 04/22/2020   CO2 28 04/22/2020   GLUCOSE 137 (H) 04/22/2020   BUN 18 04/22/2020   CREATININE 0.82 04/22/2020   BILITOT 0.3 04/22/2020   ALKPHOS 69 04/22/2020   AST 18 04/22/2020   ALT 10 04/22/2020   PROT 8.2 (H) 04/22/2020   ALBUMIN 3.1 (L) 04/22/2020   CALCIUM 8.9 04/22/2020   GFRAA 103 03/04/2020   QFTBGOLDPLUS NEGATIVE 03/04/2020    Speciality Comments: No specialty comments available.  Procedures:  No procedures performed Allergies: Other and Statins   Assessment / Plan:     Visit Diagnoses: Rheumatoid arthritis with rheumatoid factor of multiple sites without organ or systems involvement (HCC) -  Positive RF, positive anti-CCP, elevated sed rate, synovitis involving multiple joints.  He is on tapering dose of prednisone.  He is currently on methotrexate 6 tablets p.o. weekly.  He took the last dose 2 days ago.  I advised him to take 2 extra tablets of methotrexate 2.5 mg tablet today and then from the next Saturday to take methotrexate 8 tablets p.o. weekly.  He will continue to take folic acid 2 mg p.o. daily.  If he has an adequate response we may have to use combination therapy.  He was hesitant to go on any  injectable medications.  High risk medication use - methotrexate 6 tablets p.o. weekly, folic acid 2 mg p.o. daily, prednisone 5.0 mg p.o. daily.  Labs obtained on April 15, 2020 showed normal CBC and CMP except for mild anemia.- Plan: CBC with Differential/Platelet, COMPLETE METABOLIC PANEL WITH GFR in 2 weeks after increasing the dose of methotrexate then 2 months and then every 3 months.  He is fully vaccinated  against COVID-19.  Use of mask, social distancing and hand hygiene was discussed.  Arthritis of both acromioclavicular joints - Followed by Dr. Erlinda Hong.  Contracture of right elbow-chronic  HNP (herniated nucleus pulposus) with myelopathy, cervical - Status post fusion by Dr. Christella Noa 11/ 2019.  He has limited range of motion.  History of bilateral carpal tunnel release - by Dr. Erlinda Hong  Wound infection after surgery - Right wrist joint after carpal tunnel release.  Multiple myeloma not having achieved remission-patient had abnormal SPEP and was referred to hematology.  He was diagnosed with multiple myeloma, IgA lambda, M protein.  He is followed by Dr. Talbert Cage.  Symptomatic cholelithiasis  Orders: Orders Placed This Encounter  Procedures  . CBC with Differential/Platelet  . COMPLETE METABOLIC PANEL WITH GFR   No orders of the defined types were placed in this encounter.    Follow-Up Instructions: Return in about 1 month (around 06/07/2020) for Rheumatoid arthritis.   Bo Merino, MD  Note - This record has been created using Editor, commissioning.  Chart creation errors have been sought, but may not always  have been located. Such creation errors do not reflect on  the standard of medical care.

## 2020-05-03 ENCOUNTER — Ambulatory Visit: Payer: Medicare Other

## 2020-05-03 ENCOUNTER — Encounter: Payer: Self-pay | Admitting: Orthopedic Surgery

## 2020-05-03 ENCOUNTER — Ambulatory Visit: Payer: Medicare Other | Admitting: Orthopedic Surgery

## 2020-05-03 ENCOUNTER — Other Ambulatory Visit: Payer: Self-pay

## 2020-05-03 VITALS — BP 143/94 | HR 108 | Ht 68.0 in

## 2020-05-03 DIAGNOSIS — M25562 Pain in left knee: Secondary | ICD-10-CM | POA: Diagnosis not present

## 2020-05-03 DIAGNOSIS — G8929 Other chronic pain: Secondary | ICD-10-CM

## 2020-05-03 DIAGNOSIS — I10 Essential (primary) hypertension: Secondary | ICD-10-CM | POA: Diagnosis not present

## 2020-05-03 DIAGNOSIS — E7800 Pure hypercholesterolemia, unspecified: Secondary | ICD-10-CM | POA: Diagnosis not present

## 2020-05-03 DIAGNOSIS — D509 Iron deficiency anemia, unspecified: Secondary | ICD-10-CM | POA: Diagnosis not present

## 2020-05-03 DIAGNOSIS — E782 Mixed hyperlipidemia: Secondary | ICD-10-CM | POA: Diagnosis not present

## 2020-05-03 DIAGNOSIS — G4700 Insomnia, unspecified: Secondary | ICD-10-CM | POA: Diagnosis not present

## 2020-05-03 NOTE — Progress Notes (Signed)
Chief Complaint  Patient presents with  . Knee Pain    Patient reports left knee worse than right, Some pain in both shoulder     Encounter Diagnosis  Name Primary?  . Chronic pain of left knee Yes   70 year old male who is overweight does not meet criteria for knee replacement currently uses a cane seeing rheumatology has mild joint space narrowing on x-ray but significant MRI noted synovitis presents back for follow-up  Currently on Rheumatrex took some prednisone as well  Symptoms are currently stable.  He is being managed for possible multiple myeloma as well.  Work-up pending  Images today again show maintenance of his joint spaces medial and lateral  At this point not a surgical candidate based on weight and disease on x-ray  BP (!) 143/94   Pulse (!) 108   Ht '5\' 8"'  (1.727 m)   BMI 42.83 kg/m   The patient meets the AMA guidelines for Morbid (severe) obesity with a BMI > 40.0 and I have recommended weight loss.    Recommend continue rheumatology follow-up with [place] on an as-needed basis

## 2020-05-03 NOTE — Patient Instructions (Signed)
Maintain a healthy weight   See the Rheumatologist

## 2020-05-10 ENCOUNTER — Other Ambulatory Visit: Payer: Self-pay

## 2020-05-10 ENCOUNTER — Ambulatory Visit: Payer: Medicare Other | Admitting: Rheumatology

## 2020-05-10 ENCOUNTER — Encounter: Payer: Self-pay | Admitting: Rheumatology

## 2020-05-10 ENCOUNTER — Other Ambulatory Visit: Payer: Self-pay | Admitting: Rheumatology

## 2020-05-10 VITALS — BP 111/81 | HR 97 | Resp 17 | Ht 68.0 in | Wt 278.6 lb

## 2020-05-10 DIAGNOSIS — C9000 Multiple myeloma not having achieved remission: Secondary | ICD-10-CM

## 2020-05-10 DIAGNOSIS — R778 Other specified abnormalities of plasma proteins: Secondary | ICD-10-CM

## 2020-05-10 DIAGNOSIS — M24521 Contracture, right elbow: Secondary | ICD-10-CM | POA: Diagnosis not present

## 2020-05-10 DIAGNOSIS — K8020 Calculus of gallbladder without cholecystitis without obstruction: Secondary | ICD-10-CM | POA: Diagnosis not present

## 2020-05-10 DIAGNOSIS — M5000 Cervical disc disorder with myelopathy, unspecified cervical region: Secondary | ICD-10-CM

## 2020-05-10 DIAGNOSIS — M19012 Primary osteoarthritis, left shoulder: Secondary | ICD-10-CM

## 2020-05-10 DIAGNOSIS — T8149XA Infection following a procedure, other surgical site, initial encounter: Secondary | ICD-10-CM | POA: Diagnosis not present

## 2020-05-10 DIAGNOSIS — M0579 Rheumatoid arthritis with rheumatoid factor of multiple sites without organ or systems involvement: Secondary | ICD-10-CM

## 2020-05-10 DIAGNOSIS — Z9889 Other specified postprocedural states: Secondary | ICD-10-CM

## 2020-05-10 DIAGNOSIS — Z79899 Other long term (current) drug therapy: Secondary | ICD-10-CM

## 2020-05-10 DIAGNOSIS — M19011 Primary osteoarthritis, right shoulder: Secondary | ICD-10-CM

## 2020-05-10 MED ORDER — METHOTREXATE 2.5 MG PO TABS: ORAL_TABLET | ORAL | 0 refills | Status: DC

## 2020-05-10 NOTE — Telephone Encounter (Signed)
Patient states doctor increased the dosage on his MTX , and he only has two pills left with no refills. Please call rx into [facility] on [address].

## 2020-05-10 NOTE — Telephone Encounter (Signed)
Last Visit: 05/10/2020 Next Visit: 06/07/2020 Labs: 05/10/2020 - CMP&CBC, not resulted yet CMP 04/22/2020:  Glucose, Bld: 137, Total Protein: 8.2, Albumin: 3.1 CBC 04/15/2020: WBC: 12.0, RBC: 3.92, Hemoglobin: 11.0 HCT: 35.7, RDW: 17.2, Neutro Abs: 8.1Abs Immature Granulocytes: 0.08  Current Dose per office note 05/10/2020: He is currently on methotrexate 6 tablets p.o. weekly. He took the last dose 2 days ago. I advised him to take 2 extra tablets of methotrexate 2.5 mg tablet today and then from the next Saturday to take methotrexate 8 tablets p.o. weekly DX: Rheumatoid arthritis with rheumatoid factor of multiple sites without organ or systems involvement   Last Fill: 04/07/2020  Okay to refill MTX?

## 2020-05-10 NOTE — Patient Instructions (Signed)
Please increase methotrexate to 8 tablets p.o. weekly along with folic acid 1 mg, 2 tablets daily.  Continue with the prednisone taper.  Please get labs in 2 weeks after increasing the dose of methotrexate and then in 2 months

## 2020-05-20 ENCOUNTER — Encounter: Payer: Self-pay | Admitting: Oncology

## 2020-05-20 ENCOUNTER — Encounter
Admission: RE | Admit: 2020-05-20 | Discharge: 2020-05-20 | Disposition: A | Discharge status: Home / Self Care | Payer: Medicare Other | Source: Ambulatory Visit | Attending: Oncology | Admitting: Oncology

## 2020-05-20 ENCOUNTER — Other Ambulatory Visit: Payer: Self-pay

## 2020-05-20 DIAGNOSIS — C9000 Multiple myeloma not having achieved remission: Secondary | ICD-10-CM

## 2020-05-20 LAB — GLUCOSE, CAPILLARY: Glucose-Capillary: 80 mg/dL (ref 70–99)

## 2020-05-20 MED ORDER — FLUDEOXYGLUCOSE F - 18 (FDG) INJECTION
14.4000 | Freq: Once | INTRAVENOUS | Status: AC | PRN
  Administered 2020-05-20: 14.4 via INTRAVENOUS

## 2020-05-24 ENCOUNTER — Encounter: Payer: Self-pay | Admitting: Oncology

## 2020-05-24 ENCOUNTER — Inpatient Hospital Stay: Payer: Medicare Other | Attending: Oncology | Admitting: Oncology

## 2020-05-24 VITALS — BP 134/82 | HR 88 | Temp 96.9°F | Resp 20 | Wt 287.4 lb

## 2020-05-24 DIAGNOSIS — N400 Enlarged prostate without lower urinary tract symptoms: Secondary | ICD-10-CM | POA: Diagnosis not present

## 2020-05-24 DIAGNOSIS — D472 Monoclonal gammopathy: Secondary | ICD-10-CM

## 2020-05-24 DIAGNOSIS — G4730 Sleep apnea, unspecified: Secondary | ICD-10-CM | POA: Insufficient documentation

## 2020-05-24 DIAGNOSIS — C9000 Multiple myeloma not having achieved remission: Secondary | ICD-10-CM | POA: Diagnosis not present

## 2020-05-24 DIAGNOSIS — Z79899 Other long term (current) drug therapy: Secondary | ICD-10-CM | POA: Insufficient documentation

## 2020-05-24 DIAGNOSIS — I10 Essential (primary) hypertension: Secondary | ICD-10-CM | POA: Diagnosis not present

## 2020-05-24 DIAGNOSIS — K219 Gastro-esophageal reflux disease without esophagitis: Secondary | ICD-10-CM | POA: Diagnosis not present

## 2020-05-24 DIAGNOSIS — Z87891 Personal history of nicotine dependence: Secondary | ICD-10-CM | POA: Diagnosis not present

## 2020-05-24 DIAGNOSIS — M069 Rheumatoid arthritis, unspecified: Secondary | ICD-10-CM

## 2020-05-24 DIAGNOSIS — E7800 Pure hypercholesterolemia, unspecified: Secondary | ICD-10-CM | POA: Insufficient documentation

## 2020-05-24 DIAGNOSIS — Z8249 Family history of ischemic heart disease and other diseases of the circulatory system: Secondary | ICD-10-CM | POA: Insufficient documentation

## 2020-05-24 DIAGNOSIS — Z7189 Other specified counseling: Secondary | ICD-10-CM | POA: Diagnosis not present

## 2020-05-24 DIAGNOSIS — Z7952 Long term (current) use of systemic steroids: Secondary | ICD-10-CM | POA: Insufficient documentation

## 2020-05-24 DIAGNOSIS — D649 Anemia, unspecified: Secondary | ICD-10-CM

## 2020-05-24 NOTE — Progress Notes (Signed)
Hematology/Oncology follow-up note [facility] [phone] [phone]   Patient Care Team: Eric Carol, MD as PCP - General (Internal Medicine)  REFERRING PROVIDER: Seward Carol, MD  CHIEF COMPLAINTS/REASON FOR VISIT:  Follow-up for multiple myeloma  HISTORY OF PRESENTING ILLNESS:  Eric Cruz is a 70 y.o. male who was seen in consultation at the request of Eric Carol, MD for evaluation of abnormal SPEP results, anemia. .   Patient recently had work up done at rheumatologist's office and was diagnosed with rheumatoid arthritis and patient was recommended to start MTX. He has not started yet Labs reviewed,  03/04/20 SPEP showed abnormal protein band of 1.7g/dl.,   , and IFE showed IgA Lamda monoclonal protein.  He has had weight loss last year, no weight loss and some weight gain during the past 6 months.   Multiple join pain.    INTERVAL HISTORY Eric Cruz is a 70 y.o. male who has above history reviewed by me today presents for follow up visit for management of multiple myeloma Problems and complaints are listed below: RA symptoms are better. He follows up with rheumatologist.  PET scan was done and he presents to discuss results and management plan.   Review of Systems  Constitutional: Positive for fatigue. Negative for appetite change, chills, fever and unexpected weight change.  HENT:   Negative for hearing loss and voice change.   Eyes: Negative for eye problems and icterus.  Respiratory: Negative for chest tightness, cough and shortness of breath.   Cardiovascular: Negative for chest pain and leg swelling.  Gastrointestinal: Negative for abdominal distention and abdominal pain.  Endocrine: Negative for hot flashes.  Genitourinary: Negative for difficulty urinating, dysuria and frequency.   Musculoskeletal: Positive for arthralgias and back pain.  Skin: Negative for itching and rash.  Neurological: Negative  for light-headedness and numbness.  Hematological: Negative for adenopathy. Does not bruise/bleed easily.  Psychiatric/Behavioral: Negative for confusion.     MEDICAL HISTORY:  Past Medical History:  Diagnosis Date  . Arthritis   . BPH (benign prostatic hyperplasia)   . Cancer [facility])    Prostate: states he had a positive biopsy but had another one a year later and it was gone.   . Depression   . Dizziness   . GERD (gastroesophageal reflux disease)   . Gout   . High cholesterol   . Hypertension   . Sleep apnea    has cpap - not currently wearing  . Varicose veins     SURGICAL HISTORY: Past Surgical History:  Procedure Laterality Date  . ANTERIOR CERVICAL DECOMP/DISCECTOMY FUSION N/A 02/21/2018   Procedure: Cervical Five-Six Cervical Six-Seven Anterior cervical decompression/discectomy/fusion;  Surgeon: Ashok Pall, MD;  Location: [place];  Service: Neurosurgery;  Laterality: N/A;  Cervical Five-Six Cervical Six-Seven Anterior cervical decompression/discectomy/fusion  . BIOPSY  10/09/2019   Procedure: BIOPSY;  Surgeon: Ronnette Juniper, MD;  Location: [facility];  Service: Gastroenterology;;  . BLADDER SURGERY    . CARPAL TUNNEL RELEASE Right 02/11/2019   Procedure: Right Carpal Tunnel Wound Irrigation;  Surgeon: Ashok Pall, MD;  Location: [place];  Service: Neurosurgery;  Laterality: Right;  Right Carpal tunnel wound exploration/wash out  . CARPAL TUNNEL RELEASE Bilateral   . CHOLECYSTECTOMY  11/22/2012  . CHOLECYSTECTOMY  11/22/2012   Procedure: LAPAROSCOPIC CHOLECYSTECTOMY;  Surgeon: Gwenyth Ober, MD;  Location: [place];  Service: General;;  . COLONOSCOPY    . COLONOSCOPY WITH PROPOFOL N/A 10/09/2019   Procedure: COLONOSCOPY WITH PROPOFOL;  Surgeon: Ronnette Juniper, MD;  Location: [facility];  Service: Gastroenterology;  Laterality: N/A;  . ESOPHAGOGASTRODUODENOSCOPY (EGD) WITH PROPOFOL N/A 10/09/2019   Procedure: ESOPHAGOGASTRODUODENOSCOPY (EGD) WITH PROPOFOL;  Surgeon: Ronnette Juniper,  MD;  Location: [facility];  Service: Gastroenterology;  Laterality: N/A;  . POLYPECTOMY  10/09/2019   Procedure: POLYPECTOMY;  Surgeon: Ronnette Juniper, MD;  Location: [facility];  Service: Gastroenterology;;    SOCIAL HISTORY: Social History   Socioeconomic History  . Marital status: Married    Spouse name: Not on file  . Number of children: Not on file  . Years of education: Not on file  . Highest education level: Not on file  Occupational History  . Not on file  Tobacco Use  . Smoking status: Former Smoker    Quit date: 04/12/1998    Years since quitting: 22.1  . Smokeless tobacco: Never Used  Vaping Use  . Vaping Use: Never used  Substance and Sexual Activity  . Alcohol use: No  . Drug use: No  . Sexual activity: Not on file  Other Topics Concern  . Not on file  Social History Narrative  . Not on file   Social Determinants of Health   Financial Resource Strain: Not on file  Food Insecurity: Not on file  Transportation Needs: Not on file  Physical Activity: Not on file  Stress: Not on file  Social Connections: Not on file  Intimate Partner Violence: Not on file    FAMILY HISTORY: Family History  Problem Relation Age of Onset  . Hypertension Mother   . Diabetes Mother   . Multiple myeloma Mother   . Hypertension Father   . Diabetes Father   . Healthy Son   . Healthy Daughter     ALLERGIES:  is allergic to other and statins.  MEDICATIONS:  Current Outpatient Medications  Medication Sig Dispense Refill  . ascorbic acid (VITAMIN C) 500 MG tablet Take 500 mg by mouth daily.    . calcium-vitamin D (OSCAL WITH D) 500-200 MG-UNIT tablet Take 1 tablet by mouth daily.    . cyanocobalamin 2000 MCG tablet Take 2,000 mcg by mouth daily.    . ferrous sulfate 325 (65 FE) MG EC tablet Take 325 mg by mouth daily with breakfast.    . finasteride (PROSCAR) 5 MG tablet Take 5 mg by mouth daily.    . folic acid (FOLVITE) 1 MG tablet Take 2 tablets (2 mg total) by mouth  daily. 180 tablet 2  . methotrexate (RHEUMATREX) 2.5 MG tablet Take 8 tabs po once weekly. Caution:Chemotherapy. Protect from light. 32 tablet 0  . Multiple Vitamins-Minerals (MULTIVITAMIN WITH MINERALS) tablet Take 1 tablet by mouth in the morning and at bedtime.    . predniSONE (DELTASONE) 5 MG tablet Take 2 tabs po qd x 2 weeks, take 1 tab po qd x 2 weeks and then take 0.5 tab po qd x 2 weeks. 49 tablet 0  . tamsulosin (FLOMAX) 0.4 MG CAPS capsule Take 0.4 mg by mouth daily after supper.    . traMADol (ULTRAM) 50 MG tablet TAKE 1 TO 2 TABLETS BY MOUTH ONCE DAILY AS NEEDED 20 tablet 0  . VITAMIN D PO Take 50 mcg by mouth daily.    . Vitamins A & D (VITAMIN A & D) ointment Apply 1 application topically daily as needed for dry skin.     No current facility-administered medications for this visit.     PHYSICAL EXAMINATION: ECOG PERFORMANCE STATUS: 1 - Symptomatic but completely  ambulatory Vitals:   05/24/20 1001  BP: 134/82  Pulse: 88  Resp: 20  Temp: (!) 96.9 F (36.1 C)   Filed Weights   05/24/20 1001  Weight: 287 lb 6.4 oz (130.4 kg)    Physical Exam Constitutional:      General: He is not in acute distress.    Appearance: He is obese.     Comments: Patient sits in the wheelchair.  HENT:     Head: Normocephalic and atraumatic.  Eyes:     General: No scleral icterus. Cardiovascular:     Rate and Rhythm: Normal rate and regular rhythm.     Heart sounds: Normal heart sounds.  Pulmonary:     Effort: Pulmonary effort is normal. No respiratory distress.     Breath sounds: No wheezing.  Abdominal:     General: Bowel sounds are normal. There is no distension.     Palpations: Abdomen is soft.  Musculoskeletal:        General: No deformity. Normal range of motion.     Cervical back: Normal range of motion and neck supple.     Comments: Trace edema, bilateral low extremities.   Skin:    General: Skin is warm and dry.     Findings: No erythema or rash.  Neurological:      Mental Status: He is alert and oriented to person, place, and time. Mental status is at baseline.     Cranial Nerves: No cranial nerve deficit.     Coordination: Coordination normal.  Psychiatric:        Mood and Affect: Mood normal.      LABORATORY DATA:  I have reviewed the data as listed Lab Results  Component Value Date   WBC 12.0 (H) 04/15/2020   HGB 11.0 (L) 04/15/2020   HCT 35.7 (L) 04/15/2020   MCV 91.1 04/15/2020   PLT 321 04/15/2020   Recent Labs    03/04/20 1006 04/22/20 1144  NA 137 135  K 4.4 3.5  CL 102 100  CO2 25 28  GLUCOSE 88 137*  BUN 16 18  CREATININE 0.88 0.82  CALCIUM 9.7 8.9  GFRNONAA 89 >60  GFRAA 103  --   PROT 8.2*  8.3* 8.2*  ALBUMIN  --  3.1*  AST 14 18  ALT 4* 10  ALKPHOS  --  69  BILITOT 0.3 0.3   Iron/TIBC/Ferritin/ %Sat    Component Value Date/Time   IRON 31 (L) 04/08/2020 1509   TIBC 259 04/08/2020 1509   FERRITIN 452 (H) 04/08/2020 1509   IRONPCTSAT 12 (L) 04/08/2020 1509      RADIOGRAPHIC STUDIES: I have personally reviewed the radiological images as listed and agreed with the findings in the report.  NM PET Image Initial (PI) Whole Body  Result Date: 05/20/2020 CLINICAL DATA:  Subsequent treatment strategy for multiple myeloma. EXAM: NUCLEAR MEDICINE PET WHOLE BODY TECHNIQUE: 14.4 mCi F-18 FDG was injected intravenously. Full-ring PET imaging was performed from the head to foot after the radiotracer. CT data was obtained and used for attenuation correction and anatomic localization. Fasting blood glucose: 80 mg/dl COMPARISON:  None. FINDINGS: Mediastinal blood pool activity: SUV max 2.6 HEAD/NECK: No hypermetabolic activity in the scalp. No hypermetabolic cervical lymph nodes. Incidental CT findings: none CHEST: Intense metabolic activity surrounding the LEFT shoulder joint. This appears to be more in the soft tissues in the bone. There are bilateral hypermetabolic small axial lymph nodes which have benign morphology. RIGHT  axillary lymph node on image  119 with SUV max equal 5.6. No suspicious pulmonary nodules. No hypermetabolic mediastinal lymph nodes. Incidental CT findings: none ABDOMEN/PELVIS: No abnormal hypermetabolic activity within the liver, pancreas, adrenal glands, or spleen. No hypermetabolic lymph nodes in the abdomen or pelvis. No soft tissue plasmacytoma. Fairly intense metabolic activity associated with small LEFT external iliac lymph nodes with SUV max equal 7.5. These nodes are normal size and morphology (image 289) Incidental CT findings: none SKELETON: The intense metabolic activity associated with the LEFT shoulder is favored localized to the soft tissues of the joint rather than the bone. No abnormal activity the pelvis or spine.  No abnormal rib activity. Incidental CT findings: none EXTREMITIES: Intense activity associated with the LEFT knee above and below the joint. No clear lesion identified on the CT portion exam. Metabolic activity associated LEFT RIGHT ankle joint greater on the RIGHT no lesion on the CT portion exam. Incidental CT findings: none IMPRESSION: 1. No convincing evidence of multiple myeloma within the bone. 2. Intense uptake associated with the LEFT shoulder and LEFT knee are favored inflammatory. 3. Uptake in the ankle joints is favored degenerative in nature. 4. No evidence soft tissue plasmacytoma. 5. Small hypermetabolic bilateral axillary nodes and LEFT inguinal nodes. Not a pattern typical of metastatic multiple myeloma. That the nodes have normal morphology and size suggests reactive process. Electronically Signed   By: Suzy Bouchard M.D.   On: 05/20/2020 16:28   DG Knee AP/LAT W/[place] Left  Result Date: 05/03/2020 Images of the left knee Chronic knee pain X-rays show some narrowing of the medial side is equal to the narrowing of the lateral side lateral x-ray looks normal except for the narrowing medially The patella is centered Impression mild arthritis medial compartment of  the left knee    ASSESSMENT & PLAN:  1. Multiple myeloma not having achieved remission ([place])   2. Rheumatoid arthritis, involving unspecified site, unspecified whether rheumatoid factor present ([place])   3. Goals of care, counseling/discussion   4. Normocytic anemia   Cancer Staging Multiple myeloma not having achieved remission ([place]) Staging form: Plasma Cell Myeloma and Plasma Cell Disorders, AJCC 8th Edition - Clinical stage from 04/22/2020: Beta-2-microglobulin (mg/L): 2.7, Albumin (g/dL): 3.1, ISS: Stage II, LDH: Normal - Signed by Earlie Server, MD on 04/22/2020   #IgA lambda multiple myeloma, M protein 2.1, normal free light chain ratio Bone marrow biopsy was reviewed and discussed with patient.  27% plasma cell, cytogenetics - normal, and myeloma FISH panel is negative.  Standard risk. I have called to Dr.Smir's office and left a message to add on Congo red staining. PET scan was independently reviewed by me and discussed with patient. No convincing evidence of multiple myeloma within the bone.  He does have increased uptake associated with the left shoulder and the left knee, activities are favored inflammatory.  Uptake in the ankle joints are favored degenerative disease.  No evidence of soft tissue plasmacytoma.  Small hypermetabolic bilateral axillary nodes and the left inguinal nodes-probably reactive process.  Discussed with patient that he has normal kidney function, calcium level, hemoglobin above 10 and no bone lesions. albumin is decreased at 3.1, slightly elevated beta microglobulin.  Stage II, anemia with hemoglobin above 10. He has smoldering multiple myeloma.  I recommend watchful waiting.  Patient will repeat multiple myeloma panel in 3 months.  Rheumatoid arthritis, patient just started on treatments with methotrexate and prednisone.  Orders Placed This Encounter  Procedures  . CBC with Differential/Platelet    Standing Status:  Future    Standing Expiration Date:    05/24/2021  . Comprehensive metabolic panel    Standing Status:   Future    Standing Expiration Date:   05/24/2021  . Kappa/lambda light chains    Standing Status:   Future    Standing Expiration Date:   05/24/2021  . Multiple Myeloma Panel (SPEP&IFE w/QIG)    Standing Status:   Future    Standing Expiration Date:   05/24/2021    All questions were answered. The patient knows to call the clinic with any problems questions or concerns.  Cc Eric Carol, MD  Return of visit: 3 months.   Earlie Server, MD, PhD 05/24/2020

## 2020-05-24 NOTE — Progress Notes (Signed)
Patient reports having left arm to shoulder pain during the PET scan.  Also has occasional right lower quadrant pain.

## 2020-05-24 NOTE — Progress Notes (Signed)
Office Visit Note  Patient: Eric Cruz             Date of Birth: [birth year]           MRN: [id number]             PCP: Seward Carol, MD Referring: Seward Carol, MD Visit Date: 06/07/2020 Occupation: '@GUAROCC' @  Subjective:  Pain in multiple joints   History of Present Illness: Eric Cruz is a 70 y.o. male with history of seropositive rheumatoid arthritis.  He is currently on methotrexate 8 tablets by mouth once weekly and folic acid 2 mg by mouth daily.  He has been tolerating MTX without any side effects and has not missed any doses.  He has not noticed any clinical improvement since starting on MTX.  He completed the prednisone taper. He continues to have pain and stiffness in multiple joints including the left shoulder, both wrists, both hands, left knee joint, and both ankle joints.  He has swelling intermittently in both hands and left knee joint.  He has difficulty with ambulation due to the severity of pain in his left knee joint and muscle fatigue.  He has been using a cane to assist with ambulation.  He describes the pain in his joints as a sharp and burning sensation.    Activities of Daily Living:  Patient reports morning stiffness for all day.  Patient Reports nocturnal pain.   Difficulty dressing/grooming: Reports Difficulty climbing stairs: Reports Difficulty getting out of chair: Reports  Difficulty using hands for taps, buttons, cutlery, and/or writing: Reports  Review of Systems  Constitutional: Positive for fatigue.  HENT: Negative for mouth sores, mouth dryness and nose dryness.   Eyes: Negative for pain, itching and dryness.  Respiratory: Negative for shortness of breath and difficulty breathing.   Cardiovascular: Negative for chest pain and palpitations.  Gastrointestinal: Negative for blood in stool, constipation and diarrhea.  Endocrine: Negative for increased urination.  Genitourinary: Negative for difficulty urinating.  Musculoskeletal:  Positive for arthralgias, joint pain, myalgias, muscle weakness, morning stiffness, muscle tenderness and myalgias. Negative for joint swelling.  Skin: Negative for color change, rash and redness.  Allergic/Immunologic: Positive for susceptible to infections.  Neurological: Positive for weakness. Negative for dizziness, numbness, headaches and memory loss.  Hematological: Negative for bruising/bleeding tendency.  Psychiatric/Behavioral: Negative for confusion.    PMFS History:  Patient Active Problem List   Diagnosis Date Noted  . Goals of care, counseling/discussion 04/22/2020  . Multiple myeloma not having achieved remission ([place]) 04/22/2020  . Rheumatoid arthritis ([place]) 04/07/2020  . High risk medication use 04/07/2020  . 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  . Synovitis of left knee 09/16/2019  . Wound infection after surgery 02/11/2019  . HNP (herniated nucleus pulposus) with myelopathy, cervical 02/21/2018  . Postop check 12/10/2012  . Symptomatic cholelithiasis 11/15/2012    Past Medical History:  Diagnosis Date  . Arthritis   . BPH (benign prostatic hyperplasia)   . Cancer [facility])    Prostate: states he had a positive biopsy but had another one a year later and it was gone.   . Depression   . Dizziness   . GERD (gastroesophageal reflux disease)   . Gout   . High cholesterol   . Hypertension   . Sleep apnea    has cpap - not currently wearing  . Varicose veins     Family History  Problem Relation Age of Onset  . Hypertension Mother   . Diabetes Mother   .  Multiple myeloma Mother   . Hypertension Father   . Diabetes Father   . Healthy Son   . Healthy Daughter    Past Surgical History:  Procedure Laterality Date  . ANTERIOR CERVICAL DECOMP/DISCECTOMY FUSION N/A 02/21/2018   Procedure: Cervical Five-Six Cervical Six-Seven Anterior cervical decompression/discectomy/fusion;  Surgeon: Ashok Pall, MD;  Location: [facility];  Service: Neurosurgery;  Laterality: N/A;   Cervical Five-Six Cervical Six-Seven Anterior cervical decompression/discectomy/fusion  . BIOPSY  10/09/2019   Procedure: BIOPSY;  Surgeon: Ronnette Juniper, MD;  Location: [facility];  Service: Gastroenterology;;  . BLADDER SURGERY    . CARPAL TUNNEL RELEASE Right 02/11/2019   Procedure: Right Carpal Tunnel Wound Irrigation;  Surgeon: Ashok Pall, MD;  Location: [place];  Service: Neurosurgery;  Laterality: Right;  Right Carpal tunnel wound exploration/wash out  . CARPAL TUNNEL RELEASE Bilateral   . CHOLECYSTECTOMY  11/22/2012  . CHOLECYSTECTOMY  11/22/2012   Procedure: LAPAROSCOPIC CHOLECYSTECTOMY;  Surgeon: Gwenyth Ober, MD;  Location: [place];  Service: General;;  . COLONOSCOPY    . COLONOSCOPY WITH PROPOFOL N/A 10/09/2019   Procedure: COLONOSCOPY WITH PROPOFOL;  Surgeon: Ronnette Juniper, MD;  Location: [facility];  Service: Gastroenterology;  Laterality: N/A;  . ESOPHAGOGASTRODUODENOSCOPY (EGD) WITH PROPOFOL N/A 10/09/2019   Procedure: ESOPHAGOGASTRODUODENOSCOPY (EGD) WITH PROPOFOL;  Surgeon: Ronnette Juniper, MD;  Location: [facility];  Service: Gastroenterology;  Laterality: N/A;  . POLYPECTOMY  10/09/2019   Procedure: POLYPECTOMY;  Surgeon: Ronnette Juniper, MD;  Location: [facility];  Service: Gastroenterology;;   Social History   Social History Narrative  . Not on file   Immunization History  Administered Date(s) Administered  . PFIZER(Purple Top)SARS-COV-2 Vaccination 03/04/2020, 04/05/2020     Objective: Vital Signs: BP 126/84 (BP Location: Left Arm, Patient Position: Sitting, Cuff Size: Large)   Pulse 92   Ht '5\' 8"'  (1.727 m)   Wt 293 lb 9.6 oz (133.2 kg)   BMI 44.64 kg/m    Physical Exam Vitals and nursing note reviewed.  Constitutional:      Appearance: He is well-developed and well-nourished.  HENT:     Head: Normocephalic and atraumatic.  Eyes:     Extraocular Movements: EOM normal.     Conjunctiva/sclera: Conjunctivae normal.     Pupils: Pupils are equal, round, and reactive  to light.  Pulmonary:     Effort: Pulmonary effort is normal.  Abdominal:     Palpations: Abdomen is soft.  Musculoskeletal:     Cervical back: Normal range of motion and neck supple.  Skin:    General: Skin is warm and dry.     Capillary Refill: Capillary refill takes less than 2 seconds.  Neurological:     Mental Status: He is alert and oriented to person, place, and time.  Psychiatric:        Mood and Affect: Mood and affect normal.        Behavior: Behavior normal.      Musculoskeletal Exam: C-spine slightly limited ROM with lateral rotation. Painful and limited ROM of both shoulder joints to about 60 degrees abduction. Right elbow joint contracture noted.  Limited ROM of both wrist joints with tenderness to palpation on the dorsal aspect bilaterally.  Tenderness and synovitis over the right 1st MCP and 3rd MCP and PIP joint.  Tenderness over all MCPs of left hand. Synovitis over the left 2nd and 3rd MCP joints. Left knee has warmth and an effusion.  Painful ROM of the left knee joint.  Right knee joint has good  ROM with no warmth or effusion. Pedal edema noted bilaterally.   CDAI Exam: CDAI Score: 22.3  Patient Global: 10 mm; Provider Global: 3 mm Swollen: 6 ; Tender: 15  Joint Exam 06/07/2020      Right  Left  Glenohumeral      Tender  Wrist   Tender   Tender  MCP 1  Swollen Tender   Tender  MCP 2   Tender  Swollen Tender  MCP 3  Swollen Tender  Swollen Tender  MCP 4   Tender   Tender  MCP 5   Tender   Tender  PIP 3  Swollen Tender     Knee     Swollen Tender     Investigation: No additional findings.  Imaging: NM PET Image Initial (PI) Whole Body  Result Date: 05/20/2020 CLINICAL DATA:  Subsequent treatment strategy for multiple myeloma. EXAM: NUCLEAR MEDICINE PET WHOLE BODY TECHNIQUE: 14.4 mCi F-18 FDG was injected intravenously. Full-ring PET imaging was performed from the head to foot after the radiotracer. CT data was obtained and used for attenuation  correction and anatomic localization. Fasting blood glucose: 80 mg/dl COMPARISON:  None. FINDINGS: Mediastinal blood pool activity: SUV max 2.6 HEAD/NECK: No hypermetabolic activity in the scalp. No hypermetabolic cervical lymph nodes. Incidental CT findings: none CHEST: Intense metabolic activity surrounding the LEFT shoulder joint. This appears to be more in the soft tissues in the bone. There are bilateral hypermetabolic small axial lymph nodes which have benign morphology. RIGHT axillary lymph node on image 119 with SUV max equal 5.6. No suspicious pulmonary nodules. No hypermetabolic mediastinal lymph nodes. Incidental CT findings: none ABDOMEN/PELVIS: No abnormal hypermetabolic activity within the liver, pancreas, adrenal glands, or spleen. No hypermetabolic lymph nodes in the abdomen or pelvis. No soft tissue plasmacytoma. Fairly intense metabolic activity associated with small LEFT external iliac lymph nodes with SUV max equal 7.5. These nodes are normal size and morphology (image 289) Incidental CT findings: none SKELETON: The intense metabolic activity associated with the LEFT shoulder is favored localized to the soft tissues of the joint rather than the bone. No abnormal activity the pelvis or spine.  No abnormal rib activity. Incidental CT findings: none EXTREMITIES: Intense activity associated with the LEFT knee above and below the joint. No clear lesion identified on the CT portion exam. Metabolic activity associated LEFT RIGHT ankle joint greater on the RIGHT no lesion on the CT portion exam. Incidental CT findings: none IMPRESSION: 1. No convincing evidence of multiple myeloma within the bone. 2. Intense uptake associated with the LEFT shoulder and LEFT knee are favored inflammatory. 3. Uptake in the ankle joints is favored degenerative in nature. 4. No evidence soft tissue plasmacytoma. 5. Small hypermetabolic bilateral axillary nodes and LEFT inguinal nodes. Not a pattern typical of metastatic  multiple myeloma. That the nodes have normal morphology and size suggests reactive process. Electronically Signed   By: Suzy Bouchard M.D.   On: 05/20/2020 16:28    Recent Labs: Lab Results  Component Value Date   WBC 12.0 (H) 04/15/2020   HGB 11.0 (L) 04/15/2020   PLT 321 04/15/2020   NA 135 04/22/2020   K 3.5 04/22/2020   CL 100 04/22/2020   CO2 28 04/22/2020   GLUCOSE 137 (H) 04/22/2020   BUN 18 04/22/2020   CREATININE 0.82 04/22/2020   BILITOT 0.3 04/22/2020   ALKPHOS 69 04/22/2020   AST 18 04/22/2020   ALT 10 04/22/2020   PROT 8.2 (H) 04/22/2020   ALBUMIN  3.1 (L) 04/22/2020   CALCIUM 8.9 04/22/2020   GFRAA 103 03/04/2020   QFTBGOLDPLUS NEGATIVE 03/04/2020    Speciality Comments: No specialty comments available.  Procedures:  No procedures performed Allergies: Other and Statins   Assessment / Plan:     Visit Diagnoses: Rheumatoid arthritis with rheumatoid factor of multiple sites without organ or systems involvement (HCC) - Positive RF, positive anti-CCP, elevated sed rate, synovitis involving multiple joints: He has ongoing joint tenderness, stiffness, and synovitis in multiple joints including the left shoulder, both wrist joints, both hands, and the left knee joint.   He has tenderness and synovitis over the right 1st and 3rd MCPs and left 2nd and 3rd MCP joints. He has warmth and a moderate effusion in the left knee joint.  He is currently taking methotrexate 8 tablets by mouth once weekly and folic acid 2 mg by mouth daily (initially started on MTX in January 2022).  He has been tolerating MTX without any side effects.  He completed the prednisone taper shortly after his last office visit on 05/10/20. According to the patient he has not noticed any clinical improvement since starting on methotrexate. He continues to have significant difficulty with mobility.  He has been using a cane to assist with ambulation.  Different treatment options were discussed today in the  detail. The indications, contraindications, and potential side effects of Humira were discussed in detail.  All questions were addressed and consent was obtained.  Applying for humira 40 mg sq injections every 14 days through his insurance. He will return to the office for the administration of the first injection. He will continue on methotrexate and folic acid as prescribed.  Refills will be sent to the pharmacy today. Also discussed the use of natural antiinflammatories which he can try taking as an adjunct to current therapy.  He will follow up in the office in 8 weeks to assess his response to combination therapy.   Counseled patient that Humira is a TNF blocking agent.  Counseled patient on purpose, proper use, and adverse effects of Humira.  Reviewed the most common adverse effects including infections, headache, and injection site reactions. Discussed that there is the possibility of an increased risk of malignancy but it is not well understood if this increased risk is due to the medication or the disease state.  Advised patient to get yearly dermatology exams due to risk of skin cancer. Counseled patient that Humira should be held prior to scheduled surgery.  Counseled patient to avoid live vaccines while on Humira.  Advised patient to get annual influenza vaccine and the pneumococcal vaccine as indicated.    Reviewed the importance of regular labs while on Humira therapy.  Standing orders placed.  Provided patient with medication education material and answered all questions.  Patient consented to Humira.  Will upload consent into the media tab.  Reviewed storage instructions of Humira.  Advised initial injection must be administered in office.  Patient verbalized understanding.  Dose will be for rheumatoid arthritis Humira 40 mg every 14 days.  Prescription pending lab results and/or insurance approval.  High risk medication use -Applying for Humira 40 mg sq injections every 14 days through his  insurance.  He will continue taking methotrexate 8  tablets by mouth once weekly and folic acid 2 mg by mouth daily.  CBC updated on 04/15/2020.  CMP updated on 04/22/2020.  He is due to update lab work today.  Orders for CBC and CMP were released.  His next  lab work will be due in 1 month and every 3 months after starting on Humira.  Standing orders for CBC and CMP are in place. TB gold negative on 03/04/2020 and will continue to be monitored yearly.- Plan: CBC with Differential/Platelet, COMPLETE METABOLIC PANEL WITH GFR  Arthritis of both acromioclavicular joints:  He has painful and limited ROM of both shoulder joints, L worse than R.  He has been experiencing severe pain in the left shoulder joint and has abduction to about 60 degrees.    Contracture of right elbow: Unchanged.  No tenderness or inflammation was noted.   HNP (herniated nucleus pulposus) with myelopathy, cervical: He has limited ROM with lateral rotation.  No symptoms of radiculopathy at this time.   History of bilateral carpal tunnel release: He continues to have intermittent paresthesias in both hands.   Symptomatic cholelithiasis  Multiple myeloma not having achieved remission ([place]): Followed by Dr. Tasia Catchings who recommended watchful waiting at this time.  He underwent a PET scan on 05/20/20 which did not reveal any convincing evidence of multiple myeloma within the bone. Dr. Tasia Catchings plans to repeat multiple myeloma panel in 3 months.  Discussed starting the patient on humira with Dr. Estanislado Pandy who agrees with the plan.  I will also reach out to Dr. Tasia Catchings so she is aware of him being on combination therapy.   Orders: Orders Placed This Encounter  Procedures  . CBC with Differential/Platelet  . COMPLETE METABOLIC PANEL WITH GFR   Meds ordered this encounter  Medications  . methotrexate (RHEUMATREX) 2.5 MG tablet    Sig: Take 8 tablets by mouth once weekly. Caution:Chemotherapy. Protect from light.    Dispense:  96 tablet    Refill:  0   . folic acid (FOLVITE) 1 MG tablet    Sig: Take 2 tablets (2 mg total) by mouth daily.    Dispense:  180 tablet    Refill:  3      Follow-Up Instructions: Return in about 8 weeks (around 08/02/2020) for Rheumatoid arthritis.   Ofilia Neas, PA-C  Note - This record has been created using Dragon software.  Chart creation errors have been sought, but may not always  have been located. Such creation errors do not reflect on  the standard of medical care.

## 2020-05-25 ENCOUNTER — Other Ambulatory Visit: Payer: Self-pay | Admitting: Rheumatology

## 2020-05-26 LAB — SURGICAL PATHOLOGY

## 2020-05-28 ENCOUNTER — Other Ambulatory Visit: Payer: Self-pay | Admitting: Rheumatology

## 2020-06-07 ENCOUNTER — Ambulatory Visit: Payer: Medicare Other | Admitting: Physician Assistant

## 2020-06-07 ENCOUNTER — Telehealth: Payer: Self-pay | Admitting: Pharmacist

## 2020-06-07 ENCOUNTER — Encounter: Payer: Self-pay | Admitting: Physician Assistant

## 2020-06-07 ENCOUNTER — Other Ambulatory Visit: Payer: Self-pay

## 2020-06-07 VITALS — BP 126/84 | HR 92 | Ht 68.0 in | Wt 293.6 lb

## 2020-06-07 DIAGNOSIS — Z9889 Other specified postprocedural states: Secondary | ICD-10-CM | POA: Diagnosis not present

## 2020-06-07 DIAGNOSIS — M19012 Primary osteoarthritis, left shoulder: Secondary | ICD-10-CM

## 2020-06-07 DIAGNOSIS — M5000 Cervical disc disorder with myelopathy, unspecified cervical region: Secondary | ICD-10-CM

## 2020-06-07 DIAGNOSIS — Z79899 Other long term (current) drug therapy: Secondary | ICD-10-CM

## 2020-06-07 DIAGNOSIS — M0579 Rheumatoid arthritis with rheumatoid factor of multiple sites without organ or systems involvement: Secondary | ICD-10-CM

## 2020-06-07 DIAGNOSIS — C9000 Multiple myeloma not having achieved remission: Secondary | ICD-10-CM

## 2020-06-07 DIAGNOSIS — M24521 Contracture, right elbow: Secondary | ICD-10-CM | POA: Diagnosis not present

## 2020-06-07 DIAGNOSIS — K8020 Calculus of gallbladder without cholecystitis without obstruction: Secondary | ICD-10-CM

## 2020-06-07 DIAGNOSIS — M19011 Primary osteoarthritis, right shoulder: Secondary | ICD-10-CM | POA: Diagnosis not present

## 2020-06-07 MED ORDER — FOLIC ACID 1 MG PO TABS: 2.0000 mg | ORAL_TABLET | Freq: Every day | ORAL | 3 refills | Status: DC

## 2020-06-07 MED ORDER — METHOTREXATE 2.5 MG PO TABS
ORAL_TABLET | ORAL | 0 refills | Status: DC
Start: 2020-06-07 — End: 2020-08-31

## 2020-06-07 NOTE — Progress Notes (Signed)
Pharmacy Note Subjective: Patient and wife Eric Cruz) presents today to [facility] for follow up office visit with Hazel Sams, PA-C.  Patient seen by the pharmacist for counseling on Humira for rheumatoid arthritis.  Prior therapy includes: methotrexate.  Patient is being watched by Dr. Tasia Catchings for multiple myeloma.  Objective:  CBC    Component Value Date/Time   WBC 12.0 (H) 04/15/2020 0801   RBC 3.92 (L) 04/15/2020 0801   HGB 11.0 (L) 04/15/2020 0801   HCT 35.7 (L) 04/15/2020 0801   PLT 321 04/15/2020 0801   MCV 91.1 04/15/2020 0801   MCH 28.1 04/15/2020 0801   MCHC 30.8 04/15/2020 0801   RDW 17.2 (H) 04/15/2020 0801   LYMPHSABS 3.1 04/15/2020 0801   MONOABS 0.6 04/15/2020 0801   EOSABS 0.1 04/15/2020 0801   BASOSABS 0.0 04/15/2020 0801     CMP     Component Value Date/Time   NA 135 04/22/2020 1144   K 3.5 04/22/2020 1144   CL 100 04/22/2020 1144   CO2 28 04/22/2020 1144   GLUCOSE 137 (H) 04/22/2020 1144   BUN 18 04/22/2020 1144   CREATININE 0.82 04/22/2020 1144   CREATININE 0.88 03/04/2020 1006   CALCIUM 8.9 04/22/2020 1144   PROT 8.2 (H) 04/22/2020 1144   ALBUMIN 3.1 (L) 04/22/2020 1144   AST 18 04/22/2020 1144   ALT 10 04/22/2020 1144   ALKPHOS 69 04/22/2020 1144   BILITOT 0.3 04/22/2020 1144   GFRNONAA >60 04/22/2020 1144   GFRNONAA 89 03/04/2020 1006   GFRAA 103 03/04/2020 1006      Baseline Immunosuppressant Therapy Labs TB GOLD Quantiferon TB Gold Latest Ref Rng & Units 03/04/2020  Quantiferon TB Gold Plus NEGATIVE NEGATIVE   Hepatitis Panel Hepatitis Latest Ref Rng & Units 03/04/2020  Hep B Surface Ag NON-REACTI NON-REACTIVE  Hep B IgM NON-REACTI NON-REACTIVE  Hep C Ab NON-REACTI NON-REACTIVE  Hep C Ab NON-REACTI NON-REACTIVE   HIV Lab Results  Component Value Date   HIV NON REACTIVE 02/12/2019   Immunoglobulins Immunoglobulin Electrophoresis Latest Ref Rng & Units 04/08/2020  IgA  70 - 320 mg/dL -  IgG 603 - 1,613 mg/dL 988  IgM 20 -  172 mg/dL 93   SPEP Serum Protein Electrophoresis Latest Ref Rng & Units 04/22/2020  Total Protein 6.5 - 8.1 g/dL 8.2(H)  Albumin 3.8 - 4.8 g/dL -  Alpha-1 0.2 - 0.3 g/dL -  Alpha-2 0.5 - 0.9 g/dL -  Beta Globulin 0.4 - 0.6 g/dL -  Beta 2 0.2 - 0.5 g/dL -  Gamma Globulin 0.8 - 1.7 g/dL -   G6PD Lab Results  Component Value Date   G6PDH 16.1 03/04/2020   TPMT Lab Results  Component Value Date   TPMT 13 03/04/2020     Chest x-ray: 03/11/2018 - wnl  Does patient have diagnosis of heart failure?  No  Assessment/Plan:  Counseled patient that Humira is a TNF blocking agent.  Counseled patient on purpose, proper use, and adverse effects of Humira.  Reviewed the most common adverse effects including infections, headache, and injection site reactions. Discussed that there is the possibility of an increased risk of malignancy but it is not well understood if this increased risk is due to the medication or the disease state.  Advised patient to get yearly dermatology exams due to risk of skin cancer - will place referral at new start visit.  Counseled patient that Humira should be held prior to scheduled surgery.  Counseled patient to avoid live  vaccines while on Humira.   Reviewed the importance of regular labs while on Humira therapy.   Drawing CBC and CMP today to monitor changes since MTX dose increase.  Provided patient with medication education material and answered all questions.  Patient consented to Humira.  Will upload consent into the media tab.  Reviewed storage instructions of Humira.  Advised initial injection must be administered in office.  Patient verbalized understanding.  Dose will be for rheumatoid arthritis Humira 40 mg every 14 days.  Patient will continue methotrexate 43m weekly.  Prescription pending lab results and/or insurance approval. Patient provided with Humira PAP application today and reviewed income document requirements.

## 2020-06-07 NOTE — Telephone Encounter (Addendum)
Please start Humira BIV.  Patient's dose will be 40mg  every 14 days for RA.  Patient provided with [employer] PAP at [place] with Hazel Sams PA-C, today on 06/07/20. He and his wife Stanton Kidney) will plan to drop off application by the end of this week.  Addendum: Signed provider portion, insurance card copy, and med list placed next to [employer] desk in Pending PAP folder.  Knox Saliva, PharmD, MPH Clinical Pharmacist (Rheumatology and Pulmonology)

## 2020-06-07 NOTE — Patient Instructions (Signed)
Adalimumab Injection What is this medicine? ADALIMUMAB (ay da LIM yoo mab) is used to treat rheumatoid and psoriatic arthritis. It is also used to treat ankylosing spondylitis, Crohn's disease, ulcerative colitis, plaque psoriasis, hidradenitis suppurativa, and uveitis. This medicine may be used for other purposes; ask your health care provider or pharmacist if you have questions. COMMON BRAND NAME(S): CYLTEZO, Humira What should I tell my health care provider before I take this medicine? They need to know if you have any of these conditions:  cancer  diabetes (high blood sugar)  having surgery  heart disease  hepatitis B  immune system problems  infections, such as tuberculosis (TB) or other bacterial, fungal, or viral infections  multiple sclerosis  recent or upcoming vaccine  an unusual reaction to adalimumab, mannitol, latex, rubber, other medicines, foods, dyes, or preservatives  pregnant or trying to get pregnant  breast-feeding How should I use this medicine? This medicine is for injection under the skin. You will be taught how to prepare and give it. Take it as directed on the prescription label. Keep taking it unless your health care provider tells you to stop. It is important that you put your used needles and syringes in a special sharps container. Do not put them in a trash can. If you do not have a sharps container, call your pharmacist or health care provider to get one. This medicine comes with INSTRUCTIONS FOR USE. Ask your pharmacist for directions on how to use this medicine. Read the information carefully. Talk to your pharmacist or health care provider if you have questions. A special MedGuide will be given to you by the pharmacist with each prescription and refill. Be sure to read this information carefully each time. Talk to your pediatrician regarding the use of this medicine in children. While this drug may be prescribed for children as young as 2 years for  selected conditions, precautions do apply. Overdosage: If you think you have taken too much of this medicine contact a poison control center or emergency room at once. NOTE: This medicine is only for you. Do not share this medicine with others. What if I miss a dose? If you miss a dose, take it as soon as you can. If it is almost time for your next dose, take only that dose. Do not take double or extra doses. It is important not to miss any doses. Talk to your health care provider about what to do if you miss a dose. What may interact with this medicine? Do not take this medicine with any of the following medications:  abatacept  anakinra  biologic medicines such as certolizumab, etanercept, golimumab, infliximab  live virus vaccines This medicine may also interact with the following medications:  cyclosporine  theophylline  vaccines  warfarin This list may not describe all possible interactions. Give your health care provider a list of all the medicines, herbs, non-prescription drugs, or dietary supplements you use. Also tell them if you smoke, drink alcohol, or use illegal drugs. Some items may interact with your medicine. What should I watch for while using this medicine? Visit your health care provider for regular checks on your progress. Tell your health care provider if your symptoms do not start to get better or if they get worse. You will be tested for tuberculosis (TB) before you start this medicine. If your doctor prescribes any medicine for TB, you should start taking the TB medicine before starting this medicine. Make sure to finish the full course of  TB medicine. This medicine may increase your risk of getting an infection. Call your health care provider for advice if you get a fever, chills, sore throat, or other symptoms of a cold or flu. Do not treat yourself. Try to avoid being around people who are sick. Talk to your health care provider about your risk of cancer. You  may be more at risk for certain types of cancer if you take this medicine. What side effects may I notice from receiving this medicine? Side effects that you should report to your doctor or health care professional as soon as possible:  allergic reactions like skin rash, itching or hives, swelling of the face, lips, or tongue  changes in vision  chest pain  dizziness  heart failure (trouble breathing; fast, irregular heartbeat; sudden weight gain; swelling of the ankles, feet, hands; unusually weak or tired)  infection (fever, chills, cough, sore throat, pain or trouble passing urine)  liver injury (dark yellow or brown urine; general ill feeling or flu-like symptoms; loss of appetite, right upper belly pain; unusually weak or tired, yellowing of the eyes or skin)  lump or swollen lymph nodes on the neck, groin, or underarm area  muscle weakness  pain, tingling, numbness in the hands or feet  red, scaly patches or raised bumps on the skin  trouble breathing  unusual bleeding or bruising  unusually weak or tired Side effects that usually do not require medical attention (report to your doctor or health care professional if they continue or are bothersome):  headache  nausea  pain, redness, or irritation at site where injected  stuffy or runny nose This list may not describe all possible side effects. Call your doctor for medical advice about side effects. You may report side effects to FDA at 1-800-FDA-1088. Where should I keep my medicine? Keep out of the reach of children and pets. Store in the refrigerator between 2 and 8 degrees C (36 and 46 degrees F). Do not freeze. Keep this medicine in the original packaging until you are ready to take it. Protect from light. Get rid of any unused medicine after the expiration date. This medicine may be stored at room temperature for up to 14 days. Keep this medicine in the original packaging. Protect from light. If it is stored at  room temperature, get rid of any unused medicine after 14 days or after it expires, whichever is first. To get rid of medicines that are no longer needed or have expired:  Take the medicine to a medicine take-back program. Check with your pharmacy or law enforcement to find a location.  If you cannot return the medicine, ask your pharmacist or health care provider how to get rid of this medicine safely. NOTE: This sheet is a summary. It may not cover all possible information. If you have questions about this medicine, talk to your doctor, pharmacist, or health care provider.  2021 Elsevier/Gold Standard (2019-05-29 17:28:40)

## 2020-06-08 ENCOUNTER — Telehealth: Payer: Self-pay | Admitting: Physician Assistant

## 2020-06-08 LAB — COMPLETE METABOLIC PANEL WITH GFR
AG Ratio: 0.8 (calc) — ABNORMAL LOW (ref 1.0–2.5)
ALT: 4 U/L — ABNORMAL LOW (ref 9–46)
AST: 13 U/L (ref 10–35)
Albumin: 3.5 g/dL — ABNORMAL LOW (ref 3.6–5.1)
Alkaline phosphatase (APISO): 83 U/L (ref 35–144)
BUN: 11 mg/dL (ref 7–25)
CO2: 25 mmol/L (ref 20–32)
Calcium: 9.5 mg/dL (ref 8.6–10.3)
Chloride: 103 mmol/L (ref 98–110)
Creat: 0.86 mg/dL (ref 0.70–1.25)
GFR, Est African American: 104 mL/min/{1.73_m2} (ref >=60)
GFR, Est Non African American: 90 mL/min/{1.73_m2} (ref >=60)
Globulin: 4.4 g/dL (calc) — ABNORMAL HIGH (ref 1.9–3.7)
Glucose, Bld: 89 mg/dL (ref 65–99)
Potassium: 4.5 mmol/L (ref 3.5–5.3)
Sodium: 139 mmol/L (ref 135–146)
Total Bilirubin: 0.2 mg/dL (ref 0.2–1.2)
Total Protein: 7.9 g/dL (ref 6.1–8.1)

## 2020-06-08 LAB — CBC WITH DIFFERENTIAL/PLATELET
Absolute Monocytes: 376 cells/uL (ref 200–950)
Basophils Absolute: 16 cells/uL (ref 0–200)
Basophils Relative: 0.2 %
Eosinophils Absolute: 152 cells/uL (ref 15–500)
Eosinophils Relative: 1.9 %
HCT: 34.6 % — ABNORMAL LOW (ref 38.5–50.0)
Hemoglobin: 11.3 g/dL — ABNORMAL LOW (ref 13.2–17.1)
Lymphs Abs: 1608 cells/uL (ref 850–3900)
MCH: 29.3 pg (ref 27.0–33.0)
MCHC: 32.7 g/dL (ref 32.0–36.0)
MCV: 89.6 fL (ref 80.0–100.0)
MPV: 10.7 fL (ref 7.5–12.5)
Monocytes Relative: 4.7 %
Neutro Abs: 5848 cells/uL (ref 1500–7800)
Neutrophils Relative %: 73.1 %
Platelets: 299 10*3/uL (ref 140–400)
RBC: 3.86 10*6/uL — ABNORMAL LOW (ref 4.20–5.80)
RDW: 17.4 % — ABNORMAL HIGH (ref 11.0–15.0)
Total Lymphocyte: 20.1 %
WBC: 8 10*3/uL (ref 3.8–10.8)

## 2020-06-08 NOTE — Telephone Encounter (Signed)
Submitted a Prior Authorization request to [facility] for [place] via Cover My Meds. Will update once we receive a response.   Key: [id number]

## 2020-06-08 NOTE — Telephone Encounter (Signed)
-----  Message from Zhou Yu, MD sent at 06/07/2020 10:47 PM EST ----- From oncology aspect, I am ok if he needs to start on TNFi.  ----- Message ----- From: Dale, Taylor M, PA-C Sent: 06/07/2020  12:43 PM EST To: Zhou Yu, MD  Hi Dr. Yu,   I am reaching out in regards to our mutual patient, Eric Cruz, who has a personal history of multiple myeloma and rheumatoid arthritis.  His RA remains uncontrolled on methotrexate as monotherapy, so we are planning to add on humira if it is approved by his insurance. Are you ok with us proceeding with a TNFi with his history of MM?  Thanks in advance for your input!  Taylor Dale, PA-C    

## 2020-06-08 NOTE — Progress Notes (Signed)
CBC and CMP stable. Anemia is improving. Please forward lab work to oncologist.

## 2020-06-09 NOTE — Telephone Encounter (Signed)
Received notification from [facility] regarding a prior authorization for Eric Cruz. Authorization has been APPROVED through 04/02/21   Authorization # [id number] Phone # [phone]  Patient will drop off patient assistance application this week per discussion at [place] on 06/07/20

## 2020-06-21 ENCOUNTER — Telehealth: Payer: Self-pay | Admitting: *Deleted

## 2020-06-21 NOTE — Telephone Encounter (Signed)
Yes, his rheumatologist discussed with me already. Ok to try

## 2020-06-21 NOTE — Telephone Encounter (Signed)
Call received stating that patient was given new prescription from Rheumatologist for Humira and he wants to know if this drug (injection) is alright to take with his cancer. Please advise

## 2020-06-21 NOTE — Telephone Encounter (Signed)
Call returned to patient and advised per Dr Tasia Catchings that it is ok to try the Humira and that she has already spoken with his Rheumatologist. He thanked me for letting him know

## 2020-06-22 NOTE — Telephone Encounter (Signed)
Patient will drop off Humira application tomorrow morning, 06/23/20. He has income documentation he will provide.  Knox Saliva, PharmD, MPH Clinical Pharmacist (Rheumatology and Pulmonology)

## 2020-06-23 NOTE — Telephone Encounter (Signed)
Submitted Patient Assistance Application to [place] for [place] along with provider portion, PA and income documents. Will update patient when we receive a response.  Fax# [phone] Phone# [phone]

## 2020-06-28 DIAGNOSIS — I10 Essential (primary) hypertension: Secondary | ICD-10-CM | POA: Diagnosis not present

## 2020-06-28 DIAGNOSIS — G4700 Insomnia, unspecified: Secondary | ICD-10-CM | POA: Diagnosis not present

## 2020-06-28 DIAGNOSIS — E7800 Pure hypercholesterolemia, unspecified: Secondary | ICD-10-CM | POA: Diagnosis not present

## 2020-06-28 DIAGNOSIS — E782 Mixed hyperlipidemia: Secondary | ICD-10-CM | POA: Diagnosis not present

## 2020-06-28 DIAGNOSIS — D509 Iron deficiency anemia, unspecified: Secondary | ICD-10-CM | POA: Diagnosis not present

## 2020-07-07 NOTE — Telephone Encounter (Signed)
Called [employer] to f/u on Humira PAP application. Date was missing on patient's form. Re-faxed application  Phone: [phone]

## 2020-07-09 ENCOUNTER — Telehealth: Payer: Self-pay

## 2020-07-09 NOTE — Telephone Encounter (Signed)
Humira application is still in review. Will f/u with [employer] next week. Notified patient and he verbalized understanding

## 2020-07-09 NOTE — Telephone Encounter (Signed)
Patient called checking the status of his Humira medication.  Patient requested a return call.

## 2020-07-09 NOTE — Telephone Encounter (Addendum)
Humira application is still in review. Will f/u with [employer] next week. Called patient to notify. He verbalized understanding

## 2020-07-12 NOTE — Telephone Encounter (Signed)
Received a fax from  [place] regarding an approval for [place] patient assistance from 07/09/20 to 04/02/21.   Phone number: [phone]  Knox Saliva, PharmD, MPH Clinical Pharmacist (Rheumatology and Pulmonology)

## 2020-07-12 NOTE — Telephone Encounter (Signed)
Patient scheduled for Humira new start on 07/15/20 @ 10:30am. Patient plans to come alone if his wife is unable to join

## 2020-07-13 DIAGNOSIS — D509 Iron deficiency anemia, unspecified: Secondary | ICD-10-CM | POA: Diagnosis not present

## 2020-07-13 DIAGNOSIS — I10 Essential (primary) hypertension: Secondary | ICD-10-CM | POA: Diagnosis not present

## 2020-07-13 DIAGNOSIS — Z1389 Encounter for screening for other disorder: Secondary | ICD-10-CM | POA: Diagnosis not present

## 2020-07-13 DIAGNOSIS — E7800 Pure hypercholesterolemia, unspecified: Secondary | ICD-10-CM | POA: Diagnosis not present

## 2020-07-13 DIAGNOSIS — D472 Monoclonal gammopathy: Secondary | ICD-10-CM | POA: Diagnosis not present

## 2020-07-13 DIAGNOSIS — Z23 Encounter for immunization: Secondary | ICD-10-CM | POA: Diagnosis not present

## 2020-07-13 DIAGNOSIS — Z0000 Encounter for general adult medical examination without abnormal findings: Secondary | ICD-10-CM | POA: Diagnosis not present

## 2020-07-13 DIAGNOSIS — G4730 Sleep apnea, unspecified: Secondary | ICD-10-CM | POA: Diagnosis not present

## 2020-07-14 NOTE — Progress Notes (Signed)
Pharmacy Note  Subjective:   Patient presents to clinic today to receive first dose of Humira with his spouse, Hillary Schwegler.  Patient received pneumonia vaccine on 07/13/20. We discussed that there are no clear recommendations with holding the Humira. He would like to wait for 2 weeks prior to start Humira  Plan: Patient will return on 4/27 @10 :30am to start Humira.

## 2020-07-15 ENCOUNTER — Other Ambulatory Visit: Payer: Self-pay

## 2020-07-15 ENCOUNTER — Ambulatory Visit: Payer: Medicare Other | Admitting: Pharmacist

## 2020-07-15 VITALS — BP 121/79 | HR 94

## 2020-07-15 DIAGNOSIS — Z7189 Other specified counseling: Secondary | ICD-10-CM

## 2020-07-15 DIAGNOSIS — M0579 Rheumatoid arthritis with rheumatoid factor of multiple sites without organ or systems involvement: Secondary | ICD-10-CM

## 2020-07-15 NOTE — Telephone Encounter (Signed)
Humira new start r/s to 4/27 @10 :30am

## 2020-07-28 ENCOUNTER — Ambulatory Visit: Payer: Medicare Other | Admitting: Pharmacist

## 2020-07-28 ENCOUNTER — Other Ambulatory Visit: Payer: Self-pay

## 2020-07-28 DIAGNOSIS — M0579 Rheumatoid arthritis with rheumatoid factor of multiple sites without organ or systems involvement: Secondary | ICD-10-CM

## 2020-07-28 DIAGNOSIS — Z79899 Other long term (current) drug therapy: Secondary | ICD-10-CM

## 2020-07-28 DIAGNOSIS — Z7189 Other specified counseling: Secondary | ICD-10-CM

## 2020-07-28 MED ORDER — HUMIRA (2 PEN) 40 MG/0.4ML ~~LOC~~ AJKT: 40.0000 mg | AUTO-INJECTOR | SUBCUTANEOUS | 0 refills | Status: DC

## 2020-07-28 NOTE — Patient Instructions (Addendum)
Your next Humira dose is due on 08/11/20, 08/25/20, and every 2 weeks thereafter  Your prescription will be shipped from [facility]. Their phone number is [phone].   Please call to schedule shipment and confirm address. They will mail medication to your home.  Labs are due in 1 month then every 3 months.  Remember the 5 C's:  COUNTER - leave on the counter at least 30 minutes but up to overnight to bring medication to room temperature. This may help prevent stinging  COLD - place something cold (like an ice gel pack or cold water bottle) on the injection site just before cleansing with alcohol. This may help reduce pain  CLARITIN - use Claritin (generic name is loratadine) for the first two weeks of treatment or the day of, the day before, and the day after injecting. This will help to minimize injection site reactions  CORTISONE CREAM - apply if injection site is irritated and itching  CALL ME - if injection site reactionis bigger than the size of your fist, looks infected, blisters, or if you develop hives

## 2020-07-28 NOTE — Progress Notes (Signed)
Pharmacy Note  Subjective:   Patient presents to clinic today to receive first dose of Humira for rheumatoid arthritis. He is naive to bDMARDs. He currently takes methotrexate 20mg  once weekly with folic acid 2mg  daily.   Patient running a fever or have signs/symptoms of infection? No  Patient currently on antibiotics for the treatment of infection? No  Patient have any upcoming invasive procedures/surgeries? No  Objective: CMP     Component Value Date/Time   NA 139 06/07/2020 1201   K 4.5 06/07/2020 1201   CL 103 06/07/2020 1201   CO2 25 06/07/2020 1201   GLUCOSE 89 06/07/2020 1201   BUN 11 06/07/2020 1201   CREATININE 0.86 06/07/2020 1201   CALCIUM 9.5 06/07/2020 1201   PROT 7.9 06/07/2020 1201   ALBUMIN 3.1 (L) 04/22/2020 1144   AST 13 06/07/2020 1201   ALT 4 (L) 06/07/2020 1201   ALKPHOS 69 04/22/2020 1144   BILITOT 0.2 06/07/2020 1201   GFRNONAA 90 06/07/2020 1201   GFRAA 104 06/07/2020 1201    CBC    Component Value Date/Time   WBC 8.0 06/07/2020 1201   RBC 3.86 (L) 06/07/2020 1201   HGB 11.3 (L) 06/07/2020 1201   HCT 34.6 (L) 06/07/2020 1201   PLT 299 06/07/2020 1201   MCV 89.6 06/07/2020 1201   MCH 29.3 06/07/2020 1201   MCHC 32.7 06/07/2020 1201   RDW 17.4 (H) 06/07/2020 1201   LYMPHSABS 1,608 06/07/2020 1201   MONOABS 0.6 04/15/2020 0801   EOSABS 152 06/07/2020 1201   BASOSABS 16 06/07/2020 1201    Baseline Immunosuppressant Therapy Labs TB GOLD Quantiferon TB Gold Latest Ref Rng & Units 03/04/2020  Quantiferon TB Gold Plus NEGATIVE NEGATIVE   Hepatitis Panel Hepatitis Latest Ref Rng & Units 03/04/2020  Hep B Surface Ag NON-REACTI NON-REACTIVE  Hep B IgM NON-REACTI NON-REACTIVE  Hep C Ab NON-REACTI NON-REACTIVE  Hep C Ab NON-REACTI NON-REACTIVE   HIV Lab Results  Component Value Date   HIV NON REACTIVE 02/12/2019   Immunoglobulins Immunoglobulin Electrophoresis Latest Ref Rng & Units 04/08/2020  IgA  70 - 320 mg/dL -  IgG 603 - 1,613 mg/dL  988  IgM 20 - 172 mg/dL 93   SPEP Serum Protein Electrophoresis Latest Ref Rng & Units 06/07/2020  Total Protein 6.1 - 8.1 g/dL 7.9  Albumin 3.8 - 4.8 g/dL -  Alpha-1 0.2 - 0.3 g/dL -  Alpha-2 0.5 - 0.9 g/dL -  Beta Globulin 0.4 - 0.6 g/dL -  Beta 2 0.2 - 0.5 g/dL -  Gamma Globulin 0.8 - 1.7 g/dL -   G6PD Lab Results  Component Value Date   G6PDH 16.1 03/04/2020   TPMT Lab Results  Component Value Date   TPMT 13 03/04/2020     Chest x-ray: 03/11/2018  Assessment/Plan:  Counseled patient that Humira is a TNF blocking agent.  Counseled patient on purpose, proper use, and adverse effects of Humira.  Reviewed the most common adverse effects including infections, headache, and injection site reactions. Discussed that there is the possibility of an increased risk of malignancy but it is not well understood if this increased risk is due to the medication or the disease state.  Advised patient to get yearly dermatology exams due to risk of skin cancer. Dermatology referral placed today.  Counseled patient that Humira should be held prior to scheduled surgery.  Counseled patient to avoid live vaccines while on Humira.  Advised patient to get annual influenza vaccine and the pneumococcal vaccine as  indicated.    Reviewed the importance of regular labs while on Humira therapy.   We reviewed that we have discussed starting Humira with Dr. Tasia Catchings, his oncologist, and he is cleared. We reviewed that he may not notice improvement with first injection and it may take 3 months to achieve maximum effectiveness.  Demonstrated proper injection technique with Humira demo device  Patient able to demonstrate proper injection technique using the teach back method.  Patient self injected in the right abdomen with:  Sample Medication: Humira 40mg /0.4 mL auto-injector NDC: [address]-20 Lot: [id number] Expiration: 04/2021  Patient tolerated well.  Observed for 30 mins in office for adverse reaction and none  noted.   Patient is to return in 1 month for labs and 6-8 weeks for follow-up appointment.  Standing orders for CBC and CMP remain in place.  TB gold due December 2022  Humira approved through [place] patient assistance.  Prescription sent with patient assistance application, so ordered as no-print today. He will be receiving shipment this week at home. He will continue Humira 40mg  every 14 days and remain on methotrexate 20mg  weekly along with folic acid 2mg  daily. Patient provided with calendar with Humira dose due dates through 2022  All questions encouraged and answered.  Instructed patient to call with any further questions or concerns.  Knox Saliva, PharmD, MPH Clinical Pharmacist (Rheumatology and Pulmonology)  07/28/2020 9:39 AM

## 2020-08-09 ENCOUNTER — Ambulatory Visit: Payer: Medicare Other | Admitting: Physician Assistant

## 2020-08-18 ENCOUNTER — Inpatient Hospital Stay: Payer: Medicare Other | Attending: Oncology

## 2020-08-18 ENCOUNTER — Other Ambulatory Visit: Payer: Self-pay

## 2020-08-18 DIAGNOSIS — C9000 Multiple myeloma not having achieved remission: Secondary | ICD-10-CM | POA: Diagnosis not present

## 2020-08-18 DIAGNOSIS — N400 Enlarged prostate without lower urinary tract symptoms: Secondary | ICD-10-CM | POA: Insufficient documentation

## 2020-08-18 DIAGNOSIS — M069 Rheumatoid arthritis, unspecified: Secondary | ICD-10-CM | POA: Diagnosis not present

## 2020-08-18 DIAGNOSIS — Z87891 Personal history of nicotine dependence: Secondary | ICD-10-CM | POA: Diagnosis not present

## 2020-08-18 DIAGNOSIS — E7800 Pure hypercholesterolemia, unspecified: Secondary | ICD-10-CM | POA: Insufficient documentation

## 2020-08-18 DIAGNOSIS — G4730 Sleep apnea, unspecified: Secondary | ICD-10-CM | POA: Insufficient documentation

## 2020-08-18 DIAGNOSIS — I10 Essential (primary) hypertension: Secondary | ICD-10-CM | POA: Insufficient documentation

## 2020-08-18 DIAGNOSIS — Z79899 Other long term (current) drug therapy: Secondary | ICD-10-CM | POA: Insufficient documentation

## 2020-08-18 DIAGNOSIS — D472 Monoclonal gammopathy: Secondary | ICD-10-CM

## 2020-08-18 DIAGNOSIS — D649 Anemia, unspecified: Secondary | ICD-10-CM | POA: Diagnosis not present

## 2020-08-18 LAB — COMPREHENSIVE METABOLIC PANEL
ALT: 8 U/L (ref 0–44)
AST: 17 U/L (ref 15–41)
Albumin: 3.6 g/dL (ref 3.5–5.0)
Alkaline Phosphatase: 80 U/L (ref 38–126)
Anion gap: 8 (ref 5–15)
BUN: 13 mg/dL (ref 8–23)
CO2: 24 mmol/L (ref 22–32)
Calcium: 9.2 mg/dL (ref 8.9–10.3)
Chloride: 103 mmol/L (ref 98–111)
Creatinine, Ser: 0.92 mg/dL (ref 0.61–1.24)
GFR, Estimated: >60 mL/min (ref >60)
Glucose, Bld: 94 mg/dL (ref 70–99)
Potassium: 4.3 mmol/L (ref 3.5–5.1)
Sodium: 135 mmol/L (ref 135–145)
Total Bilirubin: 0.6 mg/dL (ref 0.3–1.2)
Total Protein: 8.7 g/dL — ABNORMAL HIGH (ref 6.5–8.1)

## 2020-08-18 LAB — CBC WITH DIFFERENTIAL/PLATELET
Abs Immature Granulocytes: 0.03 10*3/uL (ref 0.00–0.07)
Basophils Absolute: 0 10*3/uL (ref 0.0–0.1)
Basophils Relative: 1 %
Eosinophils Absolute: 0.3 10*3/uL (ref 0.0–0.5)
Eosinophils Relative: 3 %
HCT: 32.7 % — ABNORMAL LOW (ref 39.0–52.0)
Hemoglobin: 10.4 g/dL — ABNORMAL LOW (ref 13.0–17.0)
Immature Granulocytes: 0 %
Lymphocytes Relative: 26 %
Lymphs Abs: 2.3 10*3/uL (ref 0.7–4.0)
MCH: 30 pg (ref 26.0–34.0)
MCHC: 31.8 g/dL (ref 30.0–36.0)
MCV: 94.2 fL (ref 80.0–100.0)
Monocytes Absolute: 0.5 10*3/uL (ref 0.1–1.0)
Monocytes Relative: 5 %
Neutro Abs: 5.7 10*3/uL (ref 1.7–7.7)
Neutrophils Relative %: 65 %
Platelets: 276 10*3/uL (ref 150–400)
RBC: 3.47 MIL/uL — ABNORMAL LOW (ref 4.22–5.81)
RDW: 17.7 % — ABNORMAL HIGH (ref 11.5–15.5)
WBC: 8.8 10*3/uL (ref 4.0–10.5)
nRBC: 0 % (ref 0.0–0.2)

## 2020-08-19 LAB — KAPPA/LAMBDA LIGHT CHAINS
Kappa free light chain: 31.8 mg/L — ABNORMAL HIGH (ref 3.3–19.4)
Kappa, lambda light chain ratio: 0.44 (ref 0.26–1.65)
Lambda free light chains: 72.9 mg/L — ABNORMAL HIGH (ref 5.7–26.3)

## 2020-08-23 LAB — MULTIPLE MYELOMA PANEL, SERUM
Albumin SerPl Elph-Mcnc: 3.8 g/dL (ref 2.9–4.4)
Albumin/Glob SerPl: 0.9 (ref 0.7–1.7)
Alpha 1: 0.2 g/dL (ref 0.0–0.4)
Alpha2 Glob SerPl Elph-Mcnc: 0.7 g/dL (ref 0.4–1.0)
B-Globulin SerPl Elph-Mcnc: 2.9 g/dL — ABNORMAL HIGH (ref 0.7–1.3)
Gamma Glob SerPl Elph-Mcnc: 0.9 g/dL (ref 0.4–1.8)
Globulin, Total: 4.7 g/dL — ABNORMAL HIGH (ref 2.2–3.9)
IgA: 2343 mg/dL — ABNORMAL HIGH (ref 61–437)
IgG (Immunoglobin G), Serum: 1114 mg/dL (ref 603–1613)
IgM (Immunoglobulin M), Srm: 80 mg/dL (ref 20–172)
M Protein SerPl Elph-Mcnc: 1.3 g/dL — ABNORMAL HIGH
Total Protein ELP: 8.5 g/dL (ref 6.0–8.5)

## 2020-08-23 NOTE — Progress Notes (Signed)
Office Visit Note  Patient: Eric Cruz             Date of Birth: 1950-06-10           MRN: [id number]             PCP: Seward Carol, MD Referring: Seward Carol, MD Visit Date: 09/06/2020 Occupation: '@GUAROCC' @  Subjective:  Medication monitoring   History of Present Illness: Eric Cruz is a 70 y.o. male with history of seropositive rheumatoid arthritis.  He is on humira 40 mg sq injections once every 14 days, methotrexate 8 tablets by mouth once weekly, and folic acid 2 mg daily.  He was started on humira on 07/28/20.  He has been tolerating Humira without any side effects.  He denies any injection site reactions.  He states that he started to notice gradual improvement in his symptoms on combination therapy.  He states that he has had less difficulty going up and down steps since the pain in his left knee has started to improve.  He continues to use a cane to assist with ambulation.  He denies any recent falls.  He continues to have pain in both hands and both wrist joints.  He has started to notice slight improvement in his grip strength but still has some difficulty with fine motor skills.  He has ongoing stiffness in both shoulder joints.  He denies any recent infections.  He is planning on proceeding with his covid-19 booster today.     Activities of Daily Living:  Patient reports morning stiffness for 15 minutes.   Patient Denies nocturnal pain.  Difficulty dressing/grooming: Denies Difficulty climbing stairs: Denies Difficulty getting out of chair: Reports Difficulty using hands for taps, buttons, cutlery, and/or writing: Reports  Review of Systems  Constitutional: Positive for fatigue.  HENT: Negative for mouth sores, mouth dryness and nose dryness.   Eyes: Negative for pain, itching and dryness.  Respiratory: Negative for shortness of breath and difficulty breathing.   Cardiovascular: Negative for chest pain and palpitations.  Gastrointestinal: Negative for  blood in stool, constipation and diarrhea.  Endocrine: Negative for increased urination.  Genitourinary: Negative for difficulty urinating.  Musculoskeletal: Positive for arthralgias, joint pain, joint swelling and morning stiffness. Negative for myalgias, muscle tenderness and myalgias.  Skin: Positive for rash. Negative for color change and redness.  Allergic/Immunologic: Positive for susceptible to infections.  Neurological: Positive for numbness and parasthesias. Negative for dizziness, headaches and memory loss.  Hematological: Negative for bruising/bleeding tendency.  Psychiatric/Behavioral: Negative for confusion.    PMFS History:  Patient Active Problem List   Diagnosis Date Noted  . Chest pain 08/25/2020  . Gastroesophageal reflux disease 08/25/2020  . Goals of care, counseling/discussion 04/22/2020  . Multiple myeloma not having achieved remission ([place]) 04/22/2020  . Rheumatoid arthritis ([place]) 04/07/2020  . High risk medication use 04/07/2020  . 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  . Synovitis of left knee 09/16/2019  . Wound infection after surgery 02/11/2019  . HNP (herniated nucleus pulposus) with myelopathy, cervical 02/21/2018  . Postop check 12/10/2012  . Symptomatic cholelithiasis 11/15/2012    Past Medical History:  Diagnosis Date  . Arthritis   . BPH (benign prostatic hyperplasia)   . Cancer [facility])    Prostate: states he had a positive biopsy but had another one a year later and it was gone.   . Depression   . Dizziness   . GERD (gastroesophageal reflux disease)   . Gout   . High  cholesterol   . Hypertension   . Sleep apnea    has cpap - not currently wearing  . Varicose veins     Family History  Problem Relation Age of Onset  . Hypertension Mother   . Diabetes Mother   . Multiple myeloma Mother   . Hypertension Father   . Diabetes Father   . Healthy Son   . Healthy Daughter    Past Surgical History:  Procedure Laterality Date  . ANTERIOR  CERVICAL DECOMP/DISCECTOMY FUSION N/A 02/21/2018   Procedure: Cervical Five-Six Cervical Six-Seven Anterior cervical decompression/discectomy/fusion;  Surgeon: Ashok Pall, MD;  Location: [place];  Service: Neurosurgery;  Laterality: N/A;  Cervical Five-Six Cervical Six-Seven Anterior cervical decompression/discectomy/fusion  . BIOPSY  10/09/2019   Procedure: BIOPSY;  Surgeon: Ronnette Juniper, MD;  [facility];  Service: Gastroenterology;;  . BLADDER SURGERY    . CARPAL TUNNEL RELEASE Right 02/11/2019   Procedure: Right Carpal Tunnel Wound Irrigation;  Surgeon: Ashok Pall, MD;  Location: [place];  Service: Neurosurgery;  Laterality: Right;  Right Carpal tunnel wound exploration/wash out  . CARPAL TUNNEL RELEASE Bilateral   . CHOLECYSTECTOMY  11/22/2012  . CHOLECYSTECTOMY  11/22/2012   Procedure: LAPAROSCOPIC CHOLECYSTECTOMY;  Surgeon: Gwenyth Ober, MD;  Location: [place];  Service: General;;  . COLONOSCOPY    . COLONOSCOPY WITH PROPOFOL N/A 10/09/2019   Procedure: COLONOSCOPY WITH PROPOFOL;  Surgeon: Ronnette Juniper, MD;  [facility];  Service: Gastroenterology;  Laterality: N/A;  . ESOPHAGOGASTRODUODENOSCOPY (EGD) WITH PROPOFOL N/A 10/09/2019   Procedure: ESOPHAGOGASTRODUODENOSCOPY (EGD) WITH PROPOFOL;  Surgeon: Ronnette Juniper, MD;  [facility];  Service: Gastroenterology;  Laterality: N/A;  . POLYPECTOMY  10/09/2019   Procedure: POLYPECTOMY;  Surgeon: Ronnette Juniper, MD;  [facility];  Service: Gastroenterology;;   Social History   Social History Narrative  . Not on file   Immunization History  Administered Date(s) Administered  . PFIZER(Purple Top)SARS-COV-2 Vaccination 03/04/2020, 04/05/2020     Objective: Vital Signs: BP 133/73 (BP Location: Left Arm, Patient Position: Sitting, Cuff Size: Large)   Pulse 77   Resp 17   Ht '5\' 8"'  (1.727 m)   Wt (!) 303 lb (137.4 kg)   BMI 46.07 kg/m    Physical Exam Vitals and nursing note reviewed.  Constitutional:       Appearance: He is well-developed.  HENT:     Head: Normocephalic and atraumatic.  Eyes:     Conjunctiva/sclera: Conjunctivae normal.     Pupils: Pupils are equal, round, and reactive to light.  Pulmonary:     Effort: Pulmonary effort is normal.  Abdominal:     Palpations: Abdomen is soft.  Musculoskeletal:     Cervical back: Normal range of motion and neck supple.  Skin:    General: Skin is warm and dry.     Capillary Refill: Capillary refill takes less than 2 seconds.  Neurological:     Mental Status: He is alert and oriented to person, place, and time.  Psychiatric:        Behavior: Behavior normal.      Musculoskeletal Exam: C-spine limited ROM.  Postural thoracic kyphosis noted.  Lumbar spine difficult to assess in seated position.  Shoulder joints have limited abduction to about 120 degrees bilaterally. Right elbow mild flexion contracture.  Limited ROM of both wrist joints.  Tenderness over both 1st MCP joints.  Complete fist formation bilaterally.  Hip joints difficult to assess ROM while seated.  Painful ROM of the left knee  joint.  Warmth of the left knee noted. Ankle joints have good ROM.  No tenderness over ankle joints.  Pedal edema noted bilaterally.   CDAI Exam: CDAI Score: 8.2  Patient Global: 7 mm; Provider Global: 5 mm Swollen: 1 ; Tender: 6  Joint Exam 09/06/2020      Right  Left  Wrist   Tender   Tender  MCP 1   Tender   Tender  MCP 4   Tender     Knee     Swollen Tender     Investigation: No additional findings.  Imaging: No results found.  Recent Labs: Lab Results  Component Value Date   WBC 8.8 08/18/2020   HGB 10.4 (L) 08/18/2020   PLT 276 08/18/2020   NA 135 08/18/2020   K 4.3 08/18/2020   CL 103 08/18/2020   CO2 24 08/18/2020   GLUCOSE 94 08/18/2020   BUN 13 08/18/2020   CREATININE 0.92 08/18/2020   BILITOT 0.6 08/18/2020   ALKPHOS 80 08/18/2020   AST 17 08/18/2020   ALT 8 08/18/2020   PROT 8.7 (H) 08/18/2020   ALBUMIN 3.6  08/18/2020   CALCIUM 9.2 08/18/2020   GFRAA 104 06/07/2020   QFTBGOLDPLUS NEGATIVE 03/04/2020    Speciality Comments: No specialty comments available.  Procedures:  No procedures performed Allergies: Other and Statins   Assessment / Plan:     Visit Diagnoses: Rheumatoid arthritis with rheumatoid factor of multiple sites without organ or systems involvement (HCC) - Positive RF, positive anti-CCP, elevated sed rate, synovitis involving multiple joints: He has started to notice gradual improvement in his joint pain and inflammation on combination therapy.  He was started on Humira on 07/28/2020.  He continues to take methotrexate 8 tablets by mouth once weekly and folic acid 2 mg by mouth daily.  He has been tolerating these medications without any side effects and has not missed any doses.  He has had less difficulty going up and down steps since starting on Humira.  He continues to have warmth and painful range of motion in the left knee joint.  He has been using a cane to assist with ambulation.  He has not had any recent falls.  We discussed possibly switching to a walker to improve his stability.  He would like to try gradually improving his walking regimen.  We discussed that we would like to give combination therapy more time to see the true efficacy.  He will remain on the current treatment regimen.  He was advised to notify us if he develops any increased joint pain or joint swelling.  He will follow-up in the office in 2 to 3 months to reassess his full response.  High risk medication use - Humira 40 mg sq injections every 14 days, methotrexate 8  tablets by mouth once weekly and folic acid 2 mg by mouth daily.  He was started on Humira on 07/28/2020.  CBC and CMP drawn on 08/18/20. Results were reviewed with the patient today in the office. He will be due to update lab work in August and every 3 months. TB gold negative 03/04/20 and will continue to be monitored yearly.  He has not had any recent  infections.  Discussed the importance of holding Humira and methotrexate if he develops signs or symptoms of an infection and to resume once the infection has completely cleared. He is planning on receiving the COVID-19 booster today.  He was advised to hold methotrexate for 1 week after the booster  dose. We discussed the importance of yearly skin exams while on Humira.  Arthritis of both acromioclavicular joints: Chronic pain.  He has painful range of motion and stiffness in both shoulder joints.  Limited abduction to about 120 degrees.  Contracture of right elbow - Unchanged.  No tenderness or inflammation was noted.  HNP (herniated nucleus pulposus) with myelopathy, cervical: He has slightly limited range of motion with lateral rotation.  History of bilateral carpal tunnel release: He continues to experience intermittent paresthesias in both hands.  Multiple myeloma not having achieved remission ([place]) - Followed by Dr. Tasia Catchings who recommended watchful waiting at this time.  He underwent a PET scan on 05/20/20.  He continues to have lab work drawn on a regular basis.  His most recent labs were drawn on 08/18/2020 which were reviewed today in the office.  Symptomatic cholelithiasis  Orders: No orders of the defined types were placed in this encounter.  No orders of the defined types were placed in this encounter.  Follow-Up Instructions: Return for Rheumatoid arthritis.   Ofilia Neas, PA-C  Note - This record has been created using Dragon software.  Chart creation errors have been sought, but may not always  have been located. Such creation errors do not reflect on  the standard of medical care.

## 2020-08-25 ENCOUNTER — Encounter: Payer: Self-pay | Admitting: Oncology

## 2020-08-25 ENCOUNTER — Inpatient Hospital Stay: Payer: Medicare Other | Admitting: Oncology

## 2020-08-25 VITALS — BP 123/82 | HR 79 | Temp 98.1°F | Resp 18 | Wt 301.7 lb

## 2020-08-25 DIAGNOSIS — D649 Anemia, unspecified: Secondary | ICD-10-CM | POA: Diagnosis not present

## 2020-08-25 DIAGNOSIS — Z79899 Other long term (current) drug therapy: Secondary | ICD-10-CM | POA: Diagnosis not present

## 2020-08-25 DIAGNOSIS — D472 Monoclonal gammopathy: Secondary | ICD-10-CM

## 2020-08-25 DIAGNOSIS — C9000 Multiple myeloma not having achieved remission: Secondary | ICD-10-CM

## 2020-08-25 DIAGNOSIS — R079 Chest pain, unspecified: Secondary | ICD-10-CM

## 2020-08-25 DIAGNOSIS — M069 Rheumatoid arthritis, unspecified: Secondary | ICD-10-CM

## 2020-08-25 DIAGNOSIS — I10 Essential (primary) hypertension: Secondary | ICD-10-CM | POA: Diagnosis not present

## 2020-08-25 DIAGNOSIS — Z87891 Personal history of nicotine dependence: Secondary | ICD-10-CM | POA: Diagnosis not present

## 2020-08-25 DIAGNOSIS — K219 Gastro-esophageal reflux disease without esophagitis: Secondary | ICD-10-CM

## 2020-08-25 DIAGNOSIS — E7800 Pure hypercholesterolemia, unspecified: Secondary | ICD-10-CM | POA: Diagnosis not present

## 2020-08-25 DIAGNOSIS — G4730 Sleep apnea, unspecified: Secondary | ICD-10-CM | POA: Diagnosis not present

## 2020-08-25 NOTE — Progress Notes (Signed)
Hematology/Oncology follow-up note [facility] Telephone:([phone] [phone]   Patient Care Team: Seward Carol, MD as PCP - General (Internal Medicine)  REFERRING PROVIDER: Seward Carol, MD  CHIEF COMPLAINTS/REASON FOR VISIT:  Follow-up for multiple myeloma  HISTORY OF PRESENTING ILLNESS:  Eric Cruz is a 70 y.o. male who was seen in consultation at the request of Seward Carol, MD for evaluation of abnormal SPEP results, anemia. .   Patient recently had work up done at rheumatologist's office and was diagnosed with rheumatoid arthritis and patient was recommended to start MTX. He has not started yet Labs reviewed,  03/04/20 SPEP showed abnormal protein band of 1.7g/dl.,   , and IFE showed IgA Lamda monoclonal protein.  He has had weight loss last year, no weight loss and some weight gain during the past 6 months.   Multiple join pain.   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  27% plasma cell, cytogenetics - normal, and myeloma FISH panel is negative.  Standard risk.  Congo red staining was added and was negative. normal kidney function, calcium level, hemoglobin above 10 and no bone lesions. albumin is decreased at 3.1, slightly elevated beta microglobulin.  Stage II, anemia with hemoglobin above 10.   INTERVAL HISTORY Eric Cruz is a 70 y.o. male who has above history reviewed by me today presents for follow up visit for management of multiple myeloma Problems and complaints are listed below: RA symptoms are better.  Currently he is on methotrexate weekly and Humira. Patient reports some mid chest sharp pain for the past 2 days.  He has acid reflux not on any acid reducer treatments currently.  Review of Systems  Constitutional: Positive for fatigue. Negative for appetite change, chills, fever and unexpected weight  change.  HENT:   Negative for hearing loss and voice change.   Eyes: Negative for eye problems and icterus.  Respiratory: Negative for chest tightness, cough and shortness of breath.   Cardiovascular: Positive for chest pain. Negative for leg swelling.  Gastrointestinal: Negative for abdominal distention and abdominal pain.  Endocrine: Negative for hot flashes.  Genitourinary: Negative for difficulty urinating, dysuria and frequency.   Musculoskeletal: Positive for arthralgias and back pain.  Skin: Negative for itching and rash.  Neurological: Negative for light-headedness and numbness.  Hematological: Negative for adenopathy. Does not bruise/bleed easily.  Psychiatric/Behavioral: Negative for confusion.     MEDICAL HISTORY:  Past Medical History:  Diagnosis Date  . Arthritis   . BPH (benign prostatic hyperplasia)   . Cancer [facility])    Prostate: states he had a positive biopsy but had another one a year later and it was gone.   . Depression   . Dizziness   . GERD (gastroesophageal reflux disease)   . Gout   . High cholesterol   . Hypertension   . Sleep apnea    has cpap - not currently wearing  . Varicose veins     SURGICAL HISTORY: Past Surgical History:  Procedure Laterality Date  . ANTERIOR CERVICAL DECOMP/DISCECTOMY FUSION N/A 02/21/2018   Procedure: Cervical Five-Six Cervical Six-Seven Anterior cervical decompression/discectomy/fusion;  Surgeon: Ashok Pall, MD;  Location: [place];  Service: Neurosurgery;  Laterality: N/A;  Cervical Five-Six Cervical Six-Seven Anterior cervical decompression/discectomy/fusion  . BIOPSY  10/09/2019   Procedure: BIOPSY;  Surgeon: Ronnette Juniper, MD;  [facility];  Service: Gastroenterology;;  . BLADDER SURGERY    .  CARPAL TUNNEL RELEASE Right 02/11/2019   Procedure: Right Carpal Tunnel Wound Irrigation;  Surgeon: Ashok Pall, MD;  Location: [place];  Service: Neurosurgery;  Laterality: Right;  Right Carpal tunnel wound  exploration/wash out  . CARPAL TUNNEL RELEASE Bilateral   . CHOLECYSTECTOMY  11/22/2012  . CHOLECYSTECTOMY  11/22/2012   Procedure: LAPAROSCOPIC CHOLECYSTECTOMY;  Surgeon: Gwenyth Ober, MD;  Location: [place];  Service: General;;  . COLONOSCOPY    . COLONOSCOPY WITH PROPOFOL N/A 10/09/2019   Procedure: COLONOSCOPY WITH PROPOFOL;  Surgeon: Ronnette Juniper, MD;  [facility];  Service: Gastroenterology;  Laterality: N/A;  . ESOPHAGOGASTRODUODENOSCOPY (EGD) WITH PROPOFOL N/A 10/09/2019   Procedure: ESOPHAGOGASTRODUODENOSCOPY (EGD) WITH PROPOFOL;  Surgeon: Ronnette Juniper, MD;  [facility];  Service: Gastroenterology;  Laterality: N/A;  . POLYPECTOMY  10/09/2019   Procedure: POLYPECTOMY;  Surgeon: Ronnette Juniper, MD;  [facility];  Service: Gastroenterology;;    SOCIAL HISTORY: Social History   Socioeconomic History  . Marital status: Married    Spouse name: Not on file  . Number of children: Not on file  . Years of education: Not on file  . Highest education level: Not on file  Occupational History  . Not on file  Tobacco Use  . Smoking status: Former Smoker    Quit date: 04/12/1998    Years since quitting: 22.3  . Smokeless tobacco: Never Used  Vaping Use  . Vaping Use: Never used  Substance and Sexual Activity  . Alcohol use: No  . Drug use: No  . Sexual activity: Not on file  Other Topics Concern  . Not on file  Social History Narrative  . Not on file   Social Determinants of Health   Financial Resource Strain: Not on file  Food Insecurity: Not on file  Transportation Needs: Not on file  Physical Activity: Not on file  Stress: Not on file  Social Connections: Not on file  Intimate Partner Violence: Not on file    FAMILY HISTORY: Family History  Problem Relation Age of Onset  . Hypertension Mother   . Diabetes Mother   . Multiple myeloma Mother   . Hypertension Father   . Diabetes Father   . Healthy Son   . Healthy Daughter     ALLERGIES:  is  allergic to other and statins.  MEDICATIONS:  Current Outpatient Medications  Medication Sig Dispense Refill  . Adalimumab (HUMIRA PEN) 40 MG/0.4ML PNKT Inject 40 mg into the skin every 14 (fourteen) days. 6 each 0  . ascorbic acid (VITAMIN C) 500 MG tablet Take 500 mg by mouth daily.    . calcium-vitamin D (OSCAL WITH D) 500-200 MG-UNIT tablet Take 1 tablet by mouth daily.    . cyanocobalamin 2000 MCG tablet Take 2,000 mcg by mouth daily.    . ferrous sulfate 325 (65 FE) MG EC tablet Take 325 mg by mouth daily with breakfast.    . finasteride (PROSCAR) 5 MG tablet Take 5 mg by mouth daily.    . folic acid (FOLVITE) 1 MG tablet Take 2 tablets (2 mg total) by mouth daily. 180 tablet 3  . methotrexate (RHEUMATREX) 2.5 MG tablet Take 8 tablets by mouth once weekly. Caution:Chemotherapy. Protect from light. 96 tablet 0  . Multiple Vitamins-Minerals (MULTIVITAMIN WITH MINERALS) tablet Take 1 tablet by mouth in the morning and at bedtime.    . tamsulosin (FLOMAX) 0.4 MG CAPS capsule Take 0.4 mg by mouth daily after supper.    Marland Kitchen VITAMIN D PO  Take 50 mcg by mouth daily.    . Vitamins A & D (VITAMIN A & D) ointment Apply 1 application topically daily as needed for dry skin.     No current facility-administered medications for this visit.     PHYSICAL EXAMINATION: ECOG PERFORMANCE STATUS: 1 - Symptomatic but completely ambulatory Vitals:   08/25/20 1037  BP: 123/82  Pulse: 79  Resp: 18  Temp: 98.1 F (36.7 C)   Filed Weights   08/25/20 1037  Weight: (!) 301 lb 11.2 oz (136.9 kg)    Physical Exam Constitutional:      General: He is not in acute distress.    Appearance: He is obese.     Comments: Patient sits in the wheelchair.  HENT:     Head: Normocephalic and atraumatic.  Eyes:     General: No scleral icterus. Cardiovascular:     Rate and Rhythm: Normal rate and regular rhythm.     Heart sounds: Normal heart sounds.  Pulmonary:     Effort: Pulmonary effort is normal. No  respiratory distress.     Breath sounds: No wheezing.  Abdominal:     General: Bowel sounds are normal. There is no distension.     Palpations: Abdomen is soft.  Musculoskeletal:        General: No deformity. Normal range of motion.     Cervical back: Normal range of motion and neck supple.     Comments: Trace edema, bilateral low extremities.   Skin:    General: Skin is warm and dry.     Findings: No erythema or rash.  Neurological:     Mental Status: He is alert and oriented to person, place, and time. Mental status is at baseline.     Cranial Nerves: No cranial nerve deficit.     Coordination: Coordination normal.  Psychiatric:        Mood and Affect: Mood normal.      LABORATORY DATA:  I have reviewed the data as listed Lab Results  Component Value Date   WBC 8.8 08/18/2020   HGB 10.4 (L) 08/18/2020   HCT 32.7 (L) 08/18/2020   MCV 94.2 08/18/2020   PLT 276 08/18/2020   Recent Labs    03/04/20 1006 04/22/20 1144 06/07/20 1201 08/18/20 1056  NA 137 135 139 135  K 4.4 3.5 4.5 4.3  CL 102 100 103 103  CO2 '25 28 25 24  ' GLUCOSE 88 137* 89 94  BUN '16 18 11 13  ' CREATININE 0.88 0.82 0.86 0.92  CALCIUM 9.7 8.9 9.5 9.2  GFRNONAA 89 >60 90 >60  GFRAA 103  --  104  --   PROT 8.2*  8.3* 8.2* 7.9 8.7*  ALBUMIN  --  3.1*  --  3.6  AST '14 18 13 17  ' ALT 4* 10 4* 8  ALKPHOS  --  69  --  80  BILITOT 0.3 0.3 0.2 0.6   Iron/TIBC/Ferritin/ %Sat    Component Value Date/Time   IRON 31 (L) 04/08/2020 1509   TIBC 259 04/08/2020 1509   FERRITIN 452 (H) 04/08/2020 1509   IRONPCTSAT 12 (L) 04/08/2020 1509      RADIOGRAPHIC STUDIES: I have personally reviewed the radiological images as listed and agreed with the findings in the report.  No results found.   ASSESSMENT & PLAN:  1. Smoldering multiple myeloma ([place])   2. Rheumatoid arthritis, involving unspecified site, unspecified whether rheumatoid factor present ([place])   3. Gastroesophageal reflux disease, unspecified  whether esophagitis present   4. 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  27% plasma cell, cytogenetics - normal, and myeloma FISH panel is negative.  Standard risk.  Congo red negative. Labs reviewed and discussed with patient M protein has decreased to 1.3, normal and stable light chain ratio. Continue watchful waiting.  Rheumatoid arthritis, patient just started on treatments with methotrexate and Humira Chest pain, unknown etiology.  Patient does have cardiovascular disease risk factors.  Advised patient to call his primary care provider for evaluation and management.  If symptoms are severe, recommend to go to ER. Questionable GERD, recommend patient to try over-the-counter omeprazole to see if symptoms may be controlled.  Orders Placed This Encounter  Procedures  . CBC with Differential/Platelet    Standing Status:   Future    Standing Expiration Date:   08/25/2021  . Comprehensive metabolic panel    Standing Status:   Future    Standing Expiration Date:   08/25/2021  . Multiple Myeloma Panel (SPEP&IFE w/QIG)    Standing Status:   Future    Standing Expiration Date:   08/25/2021  . Kappa/lambda light chains    Standing Status:   Future    Standing Expiration Date:   08/25/2021    All questions were answered. The patient knows to call the clinic with any problems questions or concerns.  Cc Seward Carol, MD  Return of visit: 4 months.   Earlie Server, MD, PhD 08/25/2020

## 2020-08-25 NOTE — Progress Notes (Signed)
Patient reports for the past 2 days he is having episodes of mid sharp mid chest pain.  Leg weakness with standing.

## 2020-08-28 ENCOUNTER — Other Ambulatory Visit: Payer: Self-pay | Admitting: Physician Assistant

## 2020-08-28 DIAGNOSIS — M0579 Rheumatoid arthritis with rheumatoid factor of multiple sites without organ or systems involvement: Secondary | ICD-10-CM

## 2020-08-31 DIAGNOSIS — R3915 Urgency of urination: Secondary | ICD-10-CM | POA: Diagnosis not present

## 2020-08-31 NOTE — Telephone Encounter (Signed)
Last Visit: 06/07/2020  Next Visit: 09/06/2020  Last Fill: 06/07/2020  DX: Rheumatoid arthritis with rheumatoid factor of multiple sites without organ or systems involvement   Current Dose per office note 06/07/2020: methotrexate 8 tablets by mouth once weekly   Labs: 08/18/2020 RBC 3.47, Hgb 10.4, Hct 32.7 RDW 17.7 Total Protein 8.7  Okay to refill MTX?

## 2020-09-06 ENCOUNTER — Ambulatory Visit: Payer: Medicare Other | Admitting: Physician Assistant

## 2020-09-06 ENCOUNTER — Encounter: Payer: Self-pay | Admitting: Physician Assistant

## 2020-09-06 ENCOUNTER — Other Ambulatory Visit: Payer: Self-pay

## 2020-09-06 VITALS — BP 133/73 | HR 77 | Resp 17 | Ht 68.0 in | Wt 303.0 lb

## 2020-09-06 DIAGNOSIS — C9000 Multiple myeloma not having achieved remission: Secondary | ICD-10-CM

## 2020-09-06 DIAGNOSIS — M0579 Rheumatoid arthritis with rheumatoid factor of multiple sites without organ or systems involvement: Secondary | ICD-10-CM

## 2020-09-06 DIAGNOSIS — M19012 Primary osteoarthritis, left shoulder: Secondary | ICD-10-CM | POA: Diagnosis not present

## 2020-09-06 DIAGNOSIS — Z79899 Other long term (current) drug therapy: Secondary | ICD-10-CM

## 2020-09-06 DIAGNOSIS — M5000 Cervical disc disorder with myelopathy, unspecified cervical region: Secondary | ICD-10-CM

## 2020-09-06 DIAGNOSIS — M19011 Primary osteoarthritis, right shoulder: Secondary | ICD-10-CM | POA: Diagnosis not present

## 2020-09-06 DIAGNOSIS — M24521 Contracture, right elbow: Secondary | ICD-10-CM | POA: Diagnosis not present

## 2020-09-06 DIAGNOSIS — K8020 Calculus of gallbladder without cholecystitis without obstruction: Secondary | ICD-10-CM

## 2020-09-06 DIAGNOSIS — Z9889 Other specified postprocedural states: Secondary | ICD-10-CM

## 2020-09-06 NOTE — Patient Instructions (Addendum)
[facility] [phone]   Hold methotrexate for 1 week after receving the covid-19 booster    Standing Labs We placed an order today for your standing lab work.   Please have your standing labs drawn in August and every 3 months   If possible, please have your labs drawn 2 weeks prior to your appointment so that the provider can discuss your results at your appointment.  Please note that you may see your imaging and lab results in [facility] before we have reviewed them. We may be awaiting multiple results to interpret others before contacting you. Please allow our office up to 72 hours to thoroughly review all of the results before contacting the office for clarification of your results.  We have open lab daily: Monday through Thursday from 1:30-4:30 PM and Friday from 1:30-4:00 PM at the office of Dr. Bo Merino, [facility].   Please be advised, all patients with office appointments requiring lab work will take precedent over walk-in lab work.  If possible, please come for your lab work on Monday and Friday afternoons, as you may experience shorter wait times. The office is located at [address], [place], [place], [place] [address] No appointment is necessary.   Labs are drawn by [facility]. Please bring your co-pay at the time of your lab draw.  You may receive a bill from [facility] for your lab work.  If you wish to have your labs drawn at another location, please call the office 24 hours in advance to send orders.  If you have any questions regarding directions or hours of operation,  please call [phone].   As a reminder, please drink plenty of water prior to coming for your lab work. Thanks!    Hand Exercises Hand exercises can be helpful for almost anyone. These exercises can strengthen the hands, improve flexibility and movement, and increase blood flow to the hands. These results can make work and daily tasks easier. Hand exercises can be  especially helpful for people who have joint pain from arthritis or have nerve damage from overuse (carpal tunnel syndrome). These exercises can also help people who have injured a hand. Exercises Most of these hand exercises are gentle stretching and motion exercises. It is usually safe to do them often throughout the day. Warming up your hands before exercise may help to reduce stiffness. You can do this with gentle massage or by placing your hands in warm water for 10-15 minutes. It is normal to feel some stretching, pulling, tightness, or mild discomfort as you begin new exercises. This will gradually improve. Stop an exercise right away if you feel sudden, severe pain or your pain gets worse. Ask your health care provider which exercises are best for you. Knuckle bend or "claw" fist 1. Stand or sit with your arm, hand, and all five fingers pointed straight up. Make sure to keep your wrist straight during the exercise. 2. Gently bend your fingers down toward your palm until the tips of your fingers are touching the top of your palm. Keep your big knuckle straight and just bend the small knuckles in your fingers. 3. Hold this position for __________ seconds. 4. Straighten (extend) your fingers back to the starting position. Repeat this exercise 5-10 times with each hand. Full finger fist 1. Stand or sit with your arm, hand, and all five fingers pointed straight up. Make sure to keep your wrist straight during the exercise. 2. Gently bend your fingers into your palm until  the tips of your fingers are touching the middle of your palm. 3. Hold this position for __________ seconds. 4. Extend your fingers back to the starting position, stretching every joint fully. Repeat this exercise 5-10 times with each hand. Straight fist 1. Stand or sit with your arm, hand, and all five fingers pointed straight up. Make sure to keep your wrist straight during the exercise. 2. Gently bend your fingers at the big  knuckle, where your fingers meet your hand, and the middle knuckle. Keep the knuckle at the tips of your fingers straight and try to touch the bottom of your palm. 3. Hold this position for __________ seconds. 4. Extend your fingers back to the starting position, stretching every joint fully. Repeat this exercise 5-10 times with each hand. Tabletop 1. Stand or sit with your arm, hand, and all five fingers pointed straight up. Make sure to keep your wrist straight during the exercise. 2. Gently bend your fingers at the big knuckle, where your fingers meet your hand, as far down as you can while keeping the small knuckles in your fingers straight. Think of forming a tabletop with your fingers. 3. Hold this position for __________ seconds. 4. Extend your fingers back to the starting position, stretching every joint fully. Repeat this exercise 5-10 times with each hand. Finger spread 1. Place your hand flat on a table with your palm facing down. Make sure your wrist stays straight as you do this exercise. 2. Spread your fingers and thumb apart from each other as far as you can until you feel a gentle stretch. Hold this position for __________ seconds. 3. Bring your fingers and thumb tight together again. Hold this position for __________ seconds. Repeat this exercise 5-10 times with each hand. Making circles 1. Stand or sit with your arm, hand, and all five fingers pointed straight up. Make sure to keep your wrist straight during the exercise. 2. Make a circle by touching the tip of your thumb to the tip of your index finger. 3. Hold for __________ seconds. Then open your hand wide. 4. Repeat this motion with your thumb and each finger on your hand. Repeat this exercise 5-10 times with each hand. Thumb motion 1. Sit with your forearm resting on a table and your wrist straight. Your thumb should be facing up toward the ceiling. Keep your fingers relaxed as you move your thumb. 2. Lift your thumb up  as high as you can toward the ceiling. Hold for __________ seconds. 3. Bend your thumb across your palm as far as you can, reaching the tip of your thumb for the small finger (pinkie) side of your palm. Hold for __________ seconds. Repeat this exercise 5-10 times with each hand. Grip strengthening 1. Hold a stress ball or other soft ball in the middle of your hand. 2. Slowly increase the pressure, squeezing the ball as much as you can without causing pain. Think of bringing the tips of your fingers into the middle of your palm. All of your finger joints should bend when doing this exercise. 3. Hold your squeeze for __________ seconds, then relax. Repeat this exercise 5-10 times with each hand.   Contact a health care provider if:  Your hand pain or discomfort gets much worse when you do an exercise.  Your hand pain or discomfort does not improve within 2 hours after you exercise. If you have any of these problems, stop doing these exercises right away. Do not do them again unless your  health care provider says that you can. Get help right away if:  You develop sudden, severe hand pain or swelling. If this happens, stop doing these exercises right away. Do not do them again unless your health care provider says that you can. This information is not intended to replace advice given to you by your health care provider. Make sure you discuss any questions you have with your health care provider. Document Revised: 07/11/2018 Document Reviewed: 03/21/2018 Elsevier Patient Education  2021 [employer].

## 2020-10-20 ENCOUNTER — Other Ambulatory Visit: Payer: Self-pay | Admitting: Pharmacist

## 2020-10-20 DIAGNOSIS — M0579 Rheumatoid arthritis with rheumatoid factor of multiple sites without organ or systems involvement: Secondary | ICD-10-CM

## 2020-10-20 MED ORDER — HUMIRA (2 PEN) 40 MG/0.4ML ~~LOC~~ AJKT: 40.0000 mg | AUTO-INJECTOR | SUBCUTANEOUS | 0 refills | Status: DC

## 2020-10-20 NOTE — Telephone Encounter (Signed)
Patient called regarding Humira. He inquired if he has to reach out to the pharmacy to schedule shipment. Advised that we will need to send new prescription today.  He is due for lab work mid-August.  He has been advised of walk-in lab hours. He has f/u on 12/13/20 with Hazel Sams, PA-C and may have labs drawn at that visit.  Rx for Humira sent for 2 month supply only until his appt in September.  Knox Saliva, PharmD, MPH, BCPS Clinical Pharmacist (Rheumatology and Pulmonology)

## 2020-10-21 ENCOUNTER — Telehealth: Payer: Self-pay | Admitting: Rheumatology

## 2020-10-21 NOTE — Telephone Encounter (Signed)
Patient calling in regards to his Humira rx. Patient states pharmacy does not have rx. Patient requests you resend, or call to direct # [phone] option 1.

## 2020-10-22 NOTE — Telephone Encounter (Addendum)
[facility]. They state they have no Humira rx on their end - Rx sent on 10/20/20 shows receipt confirmed by pharmacy. Provided verbal order. Notified patient and he will plan to call later this afternoon to schedule Humira shipment to his home. Nothing further needed.  Knox Saliva, PharmD, MPH, BCPS Clinical Pharmacist (Rheumatology and Pulmonology)

## 2020-10-29 ENCOUNTER — Ambulatory Visit
Admission: EM | Admit: 2020-10-29 | Discharge: 2020-10-29 | Disposition: A | Discharge status: Home / Self Care | Payer: Medicare Other | Attending: Physician Assistant | Admitting: Physician Assistant

## 2020-10-29 ENCOUNTER — Other Ambulatory Visit: Payer: Self-pay

## 2020-10-29 DIAGNOSIS — L02415 Cutaneous abscess of right lower limb: Secondary | ICD-10-CM | POA: Diagnosis not present

## 2020-10-29 MED ORDER — DOXYCYCLINE HYCLATE 100 MG PO CAPS: 100.0000 mg | ORAL_CAPSULE | Freq: Two times a day (BID) | ORAL | 0 refills | Status: DC

## 2020-10-29 MED ORDER — "GAUZE PADS & DRESSINGS 2""X2"" PADS": MEDICATED_PAD | 0 refills | Status: DC

## 2020-10-29 NOTE — ED Provider Notes (Signed)
[facility]    CSN: [id number] Arrival date & time: 10/29/20  1256      History   Chief Complaint Chief Complaint  Patient presents with   Abscess    Right groin    HPI Eric Cruz is a 70 y.o. male.   Patient presents today with a several week history of abscess on his right thigh.  He denies any known injury prior to symptom onset.  He has a history of recurrent abscesses to current symptoms are similar to previous episodes of this condition.  Denies any fever, nausea, vomiting, spread of erythema.  He has been cleaning area with soap and water as well as applying Neosporin.  He has been packing area and has gradually been using less packing but is concerned that lesion has not completely healed.  He is currently prescribed Humira for rheumatoid arthritis as well as has a history of multiple myeloma and prostate cancer so is concerned about potential infection.  He denies any recent antibiotic use.  He denies history of MRSA.   Past Medical History:  Diagnosis Date   Arthritis    BPH (benign prostatic hyperplasia)    Cancer (HCC)    Prostate: states he had a positive biopsy but had another one a year later and it was gone.    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   Synovitis of left knee 09/16/2019   Wound infection after surgery 02/11/2019   HNP (herniated nucleus pulposus) with myelopathy, 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/21/2018   Postop check 12/10/2012   Symptomatic  cholelithiasis 11/15/2012    Past Surgical History:  Procedure Laterality Date   ANTERIOR CERVICAL DECOMP/DISCECTOMY FUSION N/A 02/21/2018   Procedure: Cervical Five-Six Cervical Six-Seven Anterior cervical decompression/discectomy/fusion;  Surgeon: Ashok Pall, MD;  Location: [place] [facility];  Service: Neurosurgery;  Laterality: N/A;  Cervical Five-Six Cervical Six-Seven Anterior cervical decompression/discectomy/fusion   BIOPSY  10/09/2019   Procedure: BIOPSY;  Surgeon: Ronnette Juniper, MD;  Location: [facility];  Service: Gastroenterology;;   BLADDER SURGERY     CARPAL TUNNEL RELEASE Right 02/11/2019   Procedure: Right Carpal Tunnel Wound Irrigation;  Surgeon: Ashok Pall, MD;  Location: [facility];  Service: Neurosurgery;  Laterality: Right;  Right Carpal tunnel wound exploration/wash out   CARPAL TUNNEL RELEASE Bilateral    CHOLECYSTECTOMY  11/22/2012   CHOLECYSTECTOMY  11/22/2012   Procedure: LAPAROSCOPIC CHOLECYSTECTOMY;  Surgeon: Gwenyth Ober, MD;  Location: [facility];  Service: General;;   COLONOSCOPY     COLONOSCOPY WITH PROPOFOL N/A 10/09/2019   Procedure: COLONOSCOPY WITH PROPOFOL;  Surgeon: Ronnette Juniper, MD;  Location: [facility];  Service: Gastroenterology;  Laterality: N/A;   ESOPHAGOGASTRODUODENOSCOPY (EGD) WITH PROPOFOL N/A 10/09/2019   Procedure: ESOPHAGOGASTRODUODENOSCOPY (EGD) WITH PROPOFOL;  Surgeon: Ronnette Juniper, MD;  Location: [facility];  Service: Gastroenterology;  Laterality: N/A;   POLYPECTOMY  10/09/2019   Procedure: POLYPECTOMY;  Surgeon: Ronnette Juniper, MD;  Location: [facility];  Service: Gastroenterology;;       Home Medications    Prior to Admission medications   Medication Sig Start Date End Date Taking? Authorizing Provider  doxycycline (VIBRAMYCIN) 100 MG capsule Take 1 capsule (100 mg total) by mouth 2 (two) times daily. 10/29/20  Yes Boy Delamater K, PA-C  Gauze Pads & Dressings 2"X2" PADS Applied daily with dressing changes. 10/29/20  Yes Kazimir Hartnett K, PA-C  Adalimumab  (HUMIRA PEN) 40 MG/0.4ML PNKT Inject 40 mg into the skin every 14 (fourteen) days. Labs due in August 2022. 10/20/20   Ofilia Neas, PA-C  ascorbic acid (VITAMIN C) 500 MG tablet Take 500 mg by mouth daily.    [provider]  calcium-vitamin D (OSCAL WITH D) 500-200 MG-UNIT tablet Take 1 tablet by mouth daily.    [provider]  cyanocobalamin 2000 MCG tablet Take 2,000 mcg by mouth daily.    [provider]  ferrous sulfate 325 (65 FE) MG EC tablet Take 325 mg by mouth daily with breakfast.    [provider]  finasteride (PROSCAR) 5 MG tablet Take 5 mg by mouth daily.    [provider]  folic acid (FOLVITE) 1 MG tablet Take 2 tablets (2 mg total) by mouth daily. 06/07/20   Ofilia Neas, PA-C  methotrexate 2.5 MG tablet TAKE 8 TABLETS BY MOUTH ONCE WEEKLY *CAUTION: CHEMOTHERAPY. PROTECTION FROM LIGHT 08/31/20   Ofilia Neas, PA-C  Multiple Vitamins-Minerals (MULTIVITAMIN WITH MINERALS) tablet Take 1 tablet by mouth in the morning and at bedtime.    [provider]  tamsulosin (FLOMAX) 0.4 MG CAPS capsule Take 0.4 mg by mouth daily after supper.    [provider]  VITAMIN D PO Take 50 mcg by mouth daily.    [provider]  Vitamins A & D (VITAMIN A & D) ointment Apply 1 application topically daily as needed for dry skin.    [provider]    Family History Family History  Problem Relation Age of Onset   Hypertension Mother    Diabetes Mother    Multiple myeloma Mother    Hypertension Father    Diabetes Father    Healthy Son    Healthy Daughter     Social History Social History   Tobacco Use   Smoking status: Former    Types: Cigarettes    Quit date: 04/12/1998    Years since quitting: 22.5   Smokeless tobacco: Never  Vaping Use   Vaping Use: Never used  Substance Use Topics   Alcohol use: No   Drug use: No     Allergies   Other and Statins   Review of Systems Review of Systems   Constitutional:  Negative for activity change, appetite change, fatigue and fever.  Respiratory:  Negative for cough and shortness of breath.   Cardiovascular:  Negative for chest pain.  Gastrointestinal:  Negative for abdominal pain, diarrhea, nausea and vomiting.  Skin:  Positive for color change and wound.  Neurological:  Negative for dizziness, weakness, light-headedness, numbness and headaches.    Physical Exam Triage Vital Signs ED Triage Vitals [10/29/20 1529]  Enc Vitals Group     BP 137/87     Pulse Rate 82     Resp 18     Temp 98.2 F (36.8 C)     Temp Source Oral     SpO2 97 %     Weight      Height  Head Circumference      Peak Flow      Pain Score 0     Pain Loc      Pain Edu?      Excl. in [place]?    No data found.  Updated Vital Signs BP 137/87 (BP Location: Left Arm)   Pulse 82   Temp 98.2 F (36.8 C) (Oral)   Resp 18   SpO2 97%   Visual Acuity Right Eye Distance:   Left Eye Distance:   Bilateral Distance:    Right Eye Near:   Left Eye Near:    Bilateral Near:     Physical Exam Vitals reviewed.  Constitutional:      General: He is awake.     Appearance: Normal appearance. He is normal weight. He is not ill-appearing.     Comments: Very pleasant male appears stated age in no acute distress sitting comfortably in exam room  HENT:     Head: Normocephalic and atraumatic.  Cardiovascular:     Rate and Rhythm: Normal rate and regular rhythm.     Heart sounds: Normal heart sounds, S1 normal and S2 normal. No murmur heard. Pulmonary:     Effort: Pulmonary effort is normal.     Breath sounds: Normal breath sounds. No stridor. No wheezing, rhonchi or rales.     Comments: Clear to auscultation bilaterally Abdominal:     Palpations: Abdomen is soft.     Tenderness: There is no abdominal tenderness.  Skin:    Comments: 1 cm ulcerated lesion noted right upper leg with surrounding erythema.  No streaking or evidence of lymphangitis.  No bleeding or  drainage noted.  Neurological:     Mental Status: He is alert.  Psychiatric:        Behavior: Behavior is cooperative.     UC Treatments / Results  Labs (all labs ordered are listed, but only abnormal results are displayed) Labs Reviewed - No data to display  EKG   Radiology No results found.  Procedures Procedures (including critical care time)  Medications Ordered in UC Medications - No data to display  Initial Impression / Assessment and Plan / UC Course  I have reviewed the triage vital signs and the nursing notes.  Pertinent labs & imaging results that were available during my care of the patient were reviewed by me and considered in my medical decision making (see chart for details).     Lesion is open and draining.  This was cleaned and irrigated with sterile saline with new packing applied.  Patient was started on doxycycline 100 mg twice daily for 10 days with instruction to avoid prolonged sun exposure due to photosensitivity associated with this medication.  He is to monitor area of erythema and if this continues to spread despite antibiotics he needs to be reevaluated.  Discussed alarm symptoms that warrant emergent evaluation.  Strict return precautions which patient expressed understanding.   Final Clinical Impressions(s) / UC Diagnoses   Final diagnoses:  Abscess of right leg     Discharge Instructions      Continue cleaning area with soap and water.  Continue packing area until packing falls out on its own.  Replace packing and bandage daily.  Start doxycycline 100 mg twice daily for 10 days.  Stay out of the sun while on this medication.  If area becomes more red, has an increase in drainage, you develop fever, nausea, vomiting you need to be seen immediately.  If your symptoms  have not healed within 3 to 5 days please return for reevaluation if you cannot see your PCP in this timeframe.     ED Prescriptions     Medication Sig Dispense Auth.  Provider   Gauze Pads & Dressings 2"X2" PADS Applied daily with dressing changes. 21 each Brynnleigh Mcelwee K, PA-C   doxycycline (VIBRAMYCIN) 100 MG capsule Take 1 capsule (100 mg total) by mouth 2 (two) times daily. 20 capsule Brieana Shimmin, Derry Skill, PA-C      PDMP not reviewed this encounter.   Terrilee Croak, PA-C 10/29/20 1559

## 2020-10-29 NOTE — Discharge Instructions (Addendum)
Continue cleaning area with soap and water.  Continue packing area until packing falls out on its own.  Replace packing and bandage daily.  Start doxycycline 100 mg twice daily for 10 days.  Stay out of the sun while on this medication.  If area becomes more red, has an increase in drainage, you develop fever, nausea, vomiting you need to be seen immediately.  If your symptoms have not healed within 3 to 5 days please return for reevaluation if you cannot see your PCP in this timeframe.

## 2020-10-29 NOTE — ED Triage Notes (Signed)
Pt c/o right groin abscess first noticed a couple of week ago. Pt noticed puss and drainage and has been self-packing the deep wound. States concerned for infection.

## 2020-11-17 DIAGNOSIS — D638 Anemia in other chronic diseases classified elsewhere: Secondary | ICD-10-CM | POA: Diagnosis not present

## 2020-11-17 DIAGNOSIS — I10 Essential (primary) hypertension: Secondary | ICD-10-CM | POA: Diagnosis not present

## 2020-11-17 DIAGNOSIS — E782 Mixed hyperlipidemia: Secondary | ICD-10-CM | POA: Diagnosis not present

## 2020-11-17 DIAGNOSIS — D509 Iron deficiency anemia, unspecified: Secondary | ICD-10-CM | POA: Diagnosis not present

## 2020-11-17 DIAGNOSIS — G4700 Insomnia, unspecified: Secondary | ICD-10-CM | POA: Diagnosis not present

## 2020-11-17 DIAGNOSIS — E7800 Pure hypercholesterolemia, unspecified: Secondary | ICD-10-CM | POA: Diagnosis not present

## 2020-11-29 NOTE — Progress Notes (Signed)
Office Visit Note  Eric Cruz: Eric Cruz             Date of Birth: [birth year]           MRN: [id number]             PCP: Seward Carol, MD Referring: Seward Carol, MD Visit Date: 12/13/2020 Occupation: '@GUAROCC' @  Subjective:  Medication monitoring   History of Present Illness: Eric Cruz is a 70 y.o. male with history of seropositive rheumatoid arthritis.  Eric Cruz is on Humira 40 mg sq injections every 14 days, methotrexate 8 tablets by mouth every other week, and folic acid 2 mg daily.  According to the Eric Cruz Eric Cruz has been skipping his dose of methotrexate on the week of his Humira injections due to misunderstanding the dosing regimen.  Eric Cruz has not missed any doses of Humira recently.  Overall Eric Cruz has no significant improvement in combination therapy.  Eric Cruz continues to have some days where Eric Cruz has increased fatigue and generalized aching.  Eric Cruz denies any joint swelling at this time.  Eric Cruz continues to have chronic pain in the left knee joint as well as his lower back.  Eric Cruz has been using a cane to assist with ambulation. Eric Cruz denies any recent infections.  Eric Cruz states that Eric Cruz has an upcoming dermatology appointment on 12/27/2020.  Eric Cruz also be having updated lab work with his oncologist on the same day.     Activities of Daily Living:  Eric Cruz reports morning stiffness for 1 hour.   Eric Cruz Reports nocturnal pain.  Difficulty dressing/grooming: Denies Difficulty climbing stairs: Reports Difficulty getting out of chair: Reports Difficulty using hands for taps, buttons, cutlery, and/or writing: Reports  Review of Systems  Constitutional:  Positive for fatigue.  HENT:  Negative for mouth sores, mouth dryness and nose dryness.   Eyes:  Negative for pain, itching and dryness.  Respiratory:  Negative for shortness of breath and difficulty breathing.   Cardiovascular:  Negative for chest pain and palpitations.  Gastrointestinal:  Negative for blood in stool, constipation and diarrhea.   Endocrine: Negative for increased urination.  Genitourinary:  Negative for difficulty urinating.  Musculoskeletal:  Positive for joint pain, joint pain, myalgias, morning stiffness, muscle tenderness and myalgias. Negative for joint swelling.  Skin:  Negative for color change, rash and redness.  Allergic/Immunologic: Positive for susceptible to infections.  Neurological:  Negative for dizziness, numbness, headaches and memory loss.  Hematological:  Negative for bruising/bleeding tendency.  Psychiatric/Behavioral:  Negative for confusion.    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    Depression    Dizziness    GERD (gastroesophageal reflux disease)    Gout    High cholesterol    Hypertension  Sleep apnea    has cpap - not currently wearing   Varicose veins     Family History  Problem Relation Age of Onset   Hypertension Mother    Diabetes Mother    Multiple myeloma Mother    Hypertension Father    Diabetes Father    Healthy Son    Healthy Daughter    Past Surgical History:  Procedure Laterality Date   ANTERIOR CERVICAL DECOMP/DISCECTOMY FUSION N/A 02/21/2018   Procedure: Cervical Five-Six Cervical Six-Seven Anterior cervical  decompression/discectomy/fusion;  Surgeon: Ashok Pall, MD;  Location: [facility];  Service: Neurosurgery;  Laterality: N/A;  Cervical Five-Six Cervical Six-Seven Anterior cervical decompression/discectomy/fusion   BIOPSY  10/09/2019   Procedure: BIOPSY;  Surgeon: Ronnette Juniper, MD;  Location: [facility];  Service: Gastroenterology;;   BLADDER SURGERY     CARPAL TUNNEL RELEASE Right 02/11/2019   Procedure: Right Carpal Tunnel Wound Irrigation;  Surgeon: Ashok Pall, MD;  Location: [facility];  Service: Neurosurgery;  Laterality: Right;  Right Carpal tunnel wound exploration/wash out   CARPAL TUNNEL RELEASE Bilateral    CHOLECYSTECTOMY  11/22/2012   CHOLECYSTECTOMY  11/22/2012   Procedure: LAPAROSCOPIC CHOLECYSTECTOMY;  Surgeon: Gwenyth Ober, MD;  Location: [facility];  Service: General;;   COLONOSCOPY     COLONOSCOPY WITH PROPOFOL N/A 10/09/2019   Procedure: COLONOSCOPY WITH PROPOFOL;  Surgeon: Ronnette Juniper, MD;  Location: [facility];  Service: Gastroenterology;  Laterality: N/A;   ESOPHAGOGASTRODUODENOSCOPY (EGD) WITH PROPOFOL N/A 10/09/2019   Procedure: ESOPHAGOGASTRODUODENOSCOPY (EGD) WITH PROPOFOL;  Surgeon: Ronnette Juniper, MD;  Location: [facility];  Service: Gastroenterology;  Laterality: N/A;   POLYPECTOMY  10/09/2019   Procedure: POLYPECTOMY;  Surgeon: Ronnette Juniper, MD;  Location: [facility];  Service: Gastroenterology;;   Social History   Social History Narrative   Not on file   Immunization History  Administered Date(s) Administered   PFIZER(Purple Top)SARS-COV-2 Vaccination 03/04/2020, 04/05/2020, 09/14/2020     Objective: Vital Signs: BP 124/88 (BP Location: Left Arm, Eric Cruz Position: Sitting, Cuff Size: Large)   Pulse 93   Ht '5\' 8"'  (1.727 m)   Wt (!) 310 lb (140.6 kg)   BMI 47.14 kg/m    Physical Exam Vitals and nursing note reviewed.  Constitutional:      Appearance: Eric Cruz is well-developed.  HENT:     Head: Normocephalic and atraumatic.  Eyes:     Conjunctiva/sclera:  Conjunctivae normal.     Pupils: Pupils are equal, round, and reactive to light.  Pulmonary:     Effort: Pulmonary effort is normal.  Abdominal:     General: Bowel sounds are normal.     Palpations: Abdomen is soft.  Musculoskeletal:     Cervical back: Normal range of motion and neck supple.  Skin:    General: Skin is warm and dry.     Capillary Refill: Capillary refill takes less than 2 seconds.  Neurological:     Mental Status: Eric Cruz is alert and oriented to person, place, and time.  Psychiatric:        Behavior: Behavior normal.     Musculoskeletal Exam: Eric Cruz remained seated during the examination.  C-spine is limited range of motion with lateral rotation.  Postural thoracic kyphosis noted.  Shoulder joints have some discomfort with abduction to about 120 degrees bilaterally.  Right elbow joint contracture unchanged.  Wrist joints have good range of motion with no tenderness or synovitis.  No tenderness or synovitis over MCP or PIP joints.  Complete fist formation bilaterally.  Hip joints  difficult to assess well in seated position.  Painful range of motion of the left knee joint. Warmth of the left knee noted. Right knee has good range of motion with no warmth or effusion.  Ankle joints have good range of motion with no joint tenderness.  Pedal edema noted bilaterally.  CDAI Exam: CDAI Score: 2.4  Eric Cruz Global: 2 mm; Provider Global: 2 mm Swollen: 1 ; Tender: 1  Joint Exam 12/13/2020      Right  Left  Knee     Swollen Tender     Investigation: No additional findings.  Imaging: No results found.  Recent Labs: Lab Results  Component Value Date   WBC 8.8 08/18/2020   HGB 10.4 (L) 08/18/2020   PLT 276 08/18/2020   NA 135 08/18/2020   K 4.3 08/18/2020   CL 103 08/18/2020   CO2 24 08/18/2020   GLUCOSE 94 08/18/2020   BUN 13 08/18/2020   CREATININE 0.92 08/18/2020   BILITOT 0.6 08/18/2020   ALKPHOS 80 08/18/2020   AST 17 08/18/2020   ALT 8 08/18/2020   PROT 8.7  (H) 08/18/2020   ALBUMIN 3.6 08/18/2020   CALCIUM 9.2 08/18/2020   GFRAA 104 06/07/2020   QFTBGOLDPLUS NEGATIVE 03/04/2020    Speciality Comments: No specialty comments available.  Procedures:  No procedures performed Allergies: Other and Statins   Assessment / Plan:     Visit Diagnoses: Rheumatoid arthritis with rheumatoid factor of multiple sites without organ or systems involvement (HCC) - Positive RF, positive anti-CCP, elevated sed rate, synovitis involving multiple joints: Eric Cruz has no synovitis on examination today.  Eric Cruz has not had any recent rheumatoid arthritis flares.  Eric Cruz continues to have occasional generalized arthralgias but has not noticed any inflammation.  Eric Cruz has noted significant clinical improvement on combination therapy.  Eric Cruz is currently on Humira 40 mg subcutaneous injections every 14 days and methotrexate 8 tablets every other week.  Eric Cruz misunderstood the dosing of methotrexate and has been skipping and on the weeks that Eric Cruz takes Humira.  Eric Cruz experiences increased breakthrough pain on the weeks that Eric Cruz does not take methotrexate.  Eric Cruz was advised to increase the frequency of methotrexate to 8 tablets every week and continue on Humira 40 mg sq injections every 14 days.  Eric Cruz was advised to notify us if Eric Cruz develops any new or worsening symptoms.  We also discussed the importance of joint protection and muscle strengthening.  Eric Cruz was strongly encouraged to increase his exercise regimen.  Eric Cruz plans on looking into Silver sneakers.  Eric Cruz declined physical therapy or water therapy at this time.  Eric Cruz will follow-up in the office in 3 months.  High risk medication use - Humira 40 mg sq injections every 14 days, methotrexate 8  tablets by mouth once weekly and folic acid 2 mg by mouth daily. CBC and CMP updated on 08/18/20.  Eric Cruz is overdue to update lab work.  Orders for CBC and CMP were provided to the Eric Cruz.  Eric Cruz plans on having lab work drawn on 12/27/2020 with his oncologist.  Eric Cruz will continue to  require lab work every 3 months.  TB gold negative on 03/04/20.  Eric Cruz has not had any recent infections.  We discussed the importance of holding methotrexate and Humira if Eric Cruz develops signs or symptoms of an infection and to resume once the infection is completely cleared. Is scheduled for a dermatology appointment on 12/27/2020 for yearly skin exam.  Discussed the increased risk for nonmelanoma skin cancer while on  Humira.  Arthritis of both acromioclavicular joints: Eric Cruz continues to have intermittent pain and stiffness in both shoulder joints.  No tenderness to palpation on examination today.  Contracture of right elbow - Unchanged.   HNP (herniated nucleus pulposus) with myelopathy, cervical: Eric Cruz has slightly limited range of motion with lateral rotation.  History of bilateral carpal tunnel release: Eric Cruz has occasional symptoms with paresthesias in both hands.  Other medical conditions are listed as follows:  Multiple myeloma not having achieved remission ([place]) - Followed by Dr. Tasia Catchings who recommended watchful waiting at this time.  Eric Cruz underwent a PET scan on 05/20/20.   Symptomatic cholelithiasis  Orders: No orders of the defined types were placed in this encounter.  No orders of the defined types were placed in this encounter.    Follow-Up Instructions: Return in about 3 months (around 03/14/2021) for Rheumatoid arthritis.   Ofilia Neas, PA-C  Note - This record has been created using Dragon software.  Chart creation errors have been sought, but may not always  have been located. Such creation errors do not reflect on  the standard of medical care.

## 2020-12-13 ENCOUNTER — Encounter: Payer: Self-pay | Admitting: Physician Assistant

## 2020-12-13 ENCOUNTER — Other Ambulatory Visit: Payer: Self-pay

## 2020-12-13 ENCOUNTER — Ambulatory Visit: Payer: Medicare Other | Admitting: Physician Assistant

## 2020-12-13 VITALS — BP 124/88 | HR 93 | Ht 68.0 in | Wt 310.0 lb

## 2020-12-13 DIAGNOSIS — Z9889 Other specified postprocedural states: Secondary | ICD-10-CM | POA: Diagnosis not present

## 2020-12-13 DIAGNOSIS — K8020 Calculus of gallbladder without cholecystitis without obstruction: Secondary | ICD-10-CM | POA: Diagnosis not present

## 2020-12-13 DIAGNOSIS — M19012 Primary osteoarthritis, left shoulder: Secondary | ICD-10-CM | POA: Diagnosis not present

## 2020-12-13 DIAGNOSIS — Z79899 Other long term (current) drug therapy: Secondary | ICD-10-CM

## 2020-12-13 DIAGNOSIS — M0579 Rheumatoid arthritis with rheumatoid factor of multiple sites without organ or systems involvement: Secondary | ICD-10-CM

## 2020-12-13 DIAGNOSIS — M5000 Cervical disc disorder with myelopathy, unspecified cervical region: Secondary | ICD-10-CM

## 2020-12-13 DIAGNOSIS — M19011 Primary osteoarthritis, right shoulder: Secondary | ICD-10-CM

## 2020-12-13 DIAGNOSIS — M24521 Contracture, right elbow: Secondary | ICD-10-CM

## 2020-12-13 DIAGNOSIS — C9000 Multiple myeloma not having achieved remission: Secondary | ICD-10-CM

## 2020-12-13 MED ORDER — METHOTREXATE SODIUM 2.5 MG PO TABS: ORAL_TABLET | ORAL | 0 refills | Status: DC

## 2020-12-13 NOTE — Patient Instructions (Signed)
Standing Labs We placed an order today for your standing lab work.   Please have your standing labs drawn in September and every 3 months    CBC w/ diff and CMP w/ GFR  If possible, please have your labs drawn 2 weeks prior to your appointment so that the provider can discuss your results at your appointment.  Please note that you may see your imaging and lab results in [facility] before we have reviewed them. We may be awaiting multiple results to interpret others before contacting you. Please allow our office up to 72 hours to thoroughly review all of the results before contacting the office for clarification of your results.  We have open lab daily: Monday through Thursday from 1:30-4:30 PM and Friday from 1:30-4:00 PM at the office of Dr. Bo Merino, [facility].   Please be advised, all patients with office appointments requiring lab work will take precedent over walk-in lab work.  If possible, please come for your lab work on Monday and Friday afternoons, as you may experience shorter wait times. The office is located at [address] [place], [place], [place] [address] No appointment is necessary.   Labs are drawn by [facility]. Please bring your co-pay at the time of your lab draw.  You may receive a bill from [facility] for your lab work.  If you wish to have your labs drawn at another location, please call the office 24 hours in advance to send orders.  If you have any questions regarding directions or hours of operation,  please call [phone].   As a reminder, please drink plenty of water prior to coming for your lab work. Thanks!

## 2020-12-13 NOTE — Telephone Encounter (Signed)
Refill of MTX is pended. Please review and send to the pharmacy. Thanks!

## 2020-12-22 ENCOUNTER — Telehealth: Payer: Self-pay

## 2020-12-22 DIAGNOSIS — M0579 Rheumatoid arthritis with rheumatoid factor of multiple sites without organ or systems involvement: Secondary | ICD-10-CM

## 2020-12-22 MED ORDER — HUMIRA (2 PEN) 40 MG/0.4ML ~~LOC~~ AJKT: 40.0000 mg | AUTO-INJECTOR | SUBCUTANEOUS | 0 refills | Status: DC

## 2020-12-22 NOTE — Telephone Encounter (Signed)
Patient called checking the status of his prescription refill of Humira.  Patient states at his appointment with Lovena Le on 12/13/20 he was told the refill would be sent to the pharmacy.

## 2020-12-22 NOTE — Telephone Encounter (Signed)
Next Visit: 03/14/2021  Last Visit: 12/13/2020  Last Fill:10/20/2020  DX: Rheumatoid arthritis with rheumatoid factor of multiple sites without organ or systems involvement   Current Dose per office note 12/13/2020: Humira 40 mg sq injections every 14 days  Labs: 08/18/2020, RBC 3.47, Hemoglobin 10.4, HCT 32.7, RDW 17.7, IgA 2,343, B-globin 2.9, M Protein 1.3, Globulin 4.7, Kappa 31.8, Lamda 72.9, Total Protein 8.7,   I called patient, patient is having labs performed at the [facility] in [place] on 12/27/2020  TB Gold: 03/04/2020, negative   Okay to refill Humira?

## 2020-12-27 ENCOUNTER — Inpatient Hospital Stay: Payer: Medicare Other | Attending: Oncology

## 2020-12-27 DIAGNOSIS — C9000 Multiple myeloma not having achieved remission: Secondary | ICD-10-CM | POA: Diagnosis not present

## 2020-12-27 DIAGNOSIS — B353 Tinea pedis: Secondary | ICD-10-CM | POA: Diagnosis not present

## 2020-12-27 DIAGNOSIS — L858 Other specified epidermal thickening: Secondary | ICD-10-CM | POA: Diagnosis not present

## 2020-12-27 DIAGNOSIS — L821 Other seborrheic keratosis: Secondary | ICD-10-CM | POA: Diagnosis not present

## 2020-12-27 DIAGNOSIS — L738 Other specified follicular disorders: Secondary | ICD-10-CM | POA: Diagnosis not present

## 2020-12-27 DIAGNOSIS — D472 Monoclonal gammopathy: Secondary | ICD-10-CM

## 2020-12-27 DIAGNOSIS — Z79899 Other long term (current) drug therapy: Secondary | ICD-10-CM | POA: Diagnosis not present

## 2020-12-27 LAB — CBC WITH DIFFERENTIAL/PLATELET
Abs Immature Granulocytes: 0.03 10*3/uL (ref 0.00–0.07)
Basophils Absolute: 0 10*3/uL (ref 0.0–0.1)
Basophils Relative: 0 %
Eosinophils Absolute: 0.2 10*3/uL (ref 0.0–0.5)
Eosinophils Relative: 2 %
HCT: 33.7 % — ABNORMAL LOW (ref 39.0–52.0)
Hemoglobin: 10.8 g/dL — ABNORMAL LOW (ref 13.0–17.0)
Immature Granulocytes: 0 %
Lymphocytes Relative: 21 %
Lymphs Abs: 2.1 10*3/uL (ref 0.7–4.0)
MCH: 30 pg (ref 26.0–34.0)
MCHC: 32 g/dL (ref 30.0–36.0)
MCV: 93.6 fL (ref 80.0–100.0)
Monocytes Absolute: 0.5 10*3/uL (ref 0.1–1.0)
Monocytes Relative: 5 %
Neutro Abs: 7 10*3/uL (ref 1.7–7.7)
Neutrophils Relative %: 72 %
Platelets: 267 10*3/uL (ref 150–400)
RBC: 3.6 MIL/uL — ABNORMAL LOW (ref 4.22–5.81)
RDW: 16.7 % — ABNORMAL HIGH (ref 11.5–15.5)
WBC: 9.7 10*3/uL (ref 4.0–10.5)
nRBC: 0 % (ref 0.0–0.2)

## 2020-12-27 LAB — COMPREHENSIVE METABOLIC PANEL
ALT: 7 U/L (ref 0–44)
AST: 18 U/L (ref 15–41)
Albumin: 3.5 g/dL (ref 3.5–5.0)
Alkaline Phosphatase: 76 U/L (ref 38–126)
Anion gap: 7 (ref 5–15)
BUN: 14 mg/dL (ref 8–23)
CO2: 25 mmol/L (ref 22–32)
Calcium: 9 mg/dL (ref 8.9–10.3)
Chloride: 102 mmol/L (ref 98–111)
Creatinine, Ser: 0.91 mg/dL (ref 0.61–1.24)
GFR, Estimated: >60 mL/min (ref >60)
Glucose, Bld: 98 mg/dL (ref 70–99)
Potassium: 4.1 mmol/L (ref 3.5–5.1)
Sodium: 134 mmol/L — ABNORMAL LOW (ref 135–145)
Total Bilirubin: 0.4 mg/dL (ref 0.3–1.2)
Total Protein: 8.5 g/dL — ABNORMAL HIGH (ref 6.5–8.1)

## 2020-12-28 LAB — KAPPA/LAMBDA LIGHT CHAINS
Kappa free light chain: 36.6 mg/L — ABNORMAL HIGH (ref 3.3–19.4)
Kappa, lambda light chain ratio: 0.47 (ref 0.26–1.65)
Lambda free light chains: 77.4 mg/L — ABNORMAL HIGH (ref 5.7–26.3)

## 2020-12-30 ENCOUNTER — Telehealth: Payer: Self-pay

## 2020-12-30 LAB — MULTIPLE MYELOMA PANEL, SERUM
Albumin SerPl Elph-Mcnc: 3.3 g/dL (ref 2.9–4.4)
Albumin/Glob SerPl: 0.8 (ref 0.7–1.7)
Alpha 1: 0.2 g/dL (ref 0.0–0.4)
Alpha2 Glob SerPl Elph-Mcnc: 0.6 g/dL (ref 0.4–1.0)
B-Globulin SerPl Elph-Mcnc: 2.7 g/dL — ABNORMAL HIGH (ref 0.7–1.3)
Gamma Glob SerPl Elph-Mcnc: 1 g/dL (ref 0.4–1.8)
Globulin, Total: 4.5 g/dL — ABNORMAL HIGH (ref 2.2–3.9)
IgA: 2166 mg/dL — ABNORMAL HIGH (ref 61–437)
IgG (Immunoglobin G), Serum: 1217 mg/dL (ref 603–1613)
IgM (Immunoglobulin M), Srm: 77 mg/dL (ref 20–172)
M Protein SerPl Elph-Mcnc: 1.6 g/dL — ABNORMAL HIGH
Total Protein ELP: 7.8 g/dL (ref 6.0–8.5)

## 2020-12-30 NOTE — Telephone Encounter (Signed)
Contacted pharmacy and they state [facility] of [facility]. They transferred the prescription so they could fill the prescription. Since patient has updated his labs gave okay to give 90 day supply. Patient advised. Patient advised he may come by the office for a sample.

## 2020-12-30 NOTE — Telephone Encounter (Signed)
Patient states he called last week checking the status of his Humira refill.  Patient states he had his labwork and was told the prescription would be sent in.  Patient states he called the pharmacy this morning 9/29 to check on his refill and was told they haven't received it.  Patient was given a phone number for the office to call.   Phone [phone] option 1 Patient states he was due to take his injection yesterday.  Patient requested a return call.

## 2020-12-30 NOTE — Telephone Encounter (Signed)
Medication Samples have been provided to the patient.  Drug name: Humira       Strength: 40 mg/0.4 mL        Qty: 1  LOT: [id number]  Exp.Date: 02/2022  Dosing instructions: Inject 40 mg into skin every 14 days.

## 2021-01-03 ENCOUNTER — Inpatient Hospital Stay: Payer: Medicare Other | Attending: Oncology | Admitting: Oncology

## 2021-01-03 ENCOUNTER — Encounter: Payer: Self-pay | Admitting: Oncology

## 2021-01-03 VITALS — BP 107/64 | HR 87 | Temp 98.1°F | Resp 18 | Wt 309.4 lb

## 2021-01-03 DIAGNOSIS — D649 Anemia, unspecified: Secondary | ICD-10-CM | POA: Insufficient documentation

## 2021-01-03 DIAGNOSIS — Z87891 Personal history of nicotine dependence: Secondary | ICD-10-CM | POA: Insufficient documentation

## 2021-01-03 DIAGNOSIS — C9000 Multiple myeloma not having achieved remission: Secondary | ICD-10-CM | POA: Insufficient documentation

## 2021-01-03 DIAGNOSIS — M069 Rheumatoid arthritis, unspecified: Secondary | ICD-10-CM | POA: Insufficient documentation

## 2021-01-03 DIAGNOSIS — I10 Essential (primary) hypertension: Secondary | ICD-10-CM | POA: Insufficient documentation

## 2021-01-03 DIAGNOSIS — D472 Monoclonal gammopathy: Secondary | ICD-10-CM

## 2021-01-03 DIAGNOSIS — N400 Enlarged prostate without lower urinary tract symptoms: Secondary | ICD-10-CM | POA: Diagnosis not present

## 2021-01-03 DIAGNOSIS — Z79899 Other long term (current) drug therapy: Secondary | ICD-10-CM | POA: Diagnosis not present

## 2021-01-03 NOTE — Progress Notes (Signed)
Hematology/Oncology follow-up note [facility] [phone] [phone]   Patient Care Team: Seward Carol, MD as PCP - General (Internal Medicine)  REFERRING PROVIDER: Seward Carol, MD  CHIEF COMPLAINTS/REASON FOR VISIT:  Follow-up for multiple myeloma  HISTORY OF PRESENTING ILLNESS:  Eric Cruz is a 70 y.o. male who was seen in consultation at the request of Seward Carol, MD for evaluation of abnormal SPEP results, anemia. .   Patient recently had work up done at rheumatologist's office and was diagnosed with rheumatoid arthritis and patient was recommended to start MTX. He has not started yet Labs reviewed,  03/04/20 SPEP showed abnormal protein band of 1.7g/dl.,   , and IFE showed IgA Lamda monoclonal protein.  He has had weight loss last year, no weight loss and some weight gain during the past 6 months.   Multiple join pain.   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  27% plasma cell, cytogenetics - normal, and myeloma FISH panel is negative.  Standard risk.  Congo red staining was added and was negative. normal kidney function, calcium level, hemoglobin above 10 and no bone lesions. albumin is decreased at 3.1, slightly elevated beta microglobulin.  Stage II, anemia with hemoglobin above 10.   INTERVAL HISTORY Eric Cruz is a 70 y.o. male who has above history reviewed by me today presents for follow up visit for management of multiple myeloma Problems and complaints are listed below: He has chronic RA symptoms.  Currently he is on methotrexate weekly and Humira. Otherwise he has no new symptoms. Chronic fatigue  Review of Systems  Constitutional:  Positive for fatigue. Negative for appetite change, chills, fever and unexpected weight change.  HENT:   Negative for hearing loss and voice change.   Eyes:  Negative  for eye problems and icterus.  Respiratory:  Negative for chest tightness, cough and shortness of breath.   Cardiovascular:  Negative for chest pain and leg swelling.  Gastrointestinal:  Negative for abdominal distention and abdominal pain.  Endocrine: Negative for hot flashes.  Genitourinary:  Negative for difficulty urinating, dysuria and frequency.   Musculoskeletal:  Positive for arthralgias and back pain.  Skin:  Negative for itching and rash.  Neurological:  Negative for light-headedness and numbness.  Hematological:  Negative for adenopathy. Does not bruise/bleed easily.  Psychiatric/Behavioral:  Negative for confusion.     MEDICAL HISTORY:  Past Medical History:  Diagnosis Date   Arthritis    BPH (benign prostatic hyperplasia)    Cancer (HCC)    Prostate: states he had a positive biopsy but had another one a year later and it was gone.    Depression    Dizziness    GERD (gastroesophageal reflux disease)    Gout    High cholesterol    Hypertension    Sleep apnea    has cpap - not currently wearing   Varicose veins     SURGICAL HISTORY: Past Surgical History:  Procedure Laterality Date   ANTERIOR CERVICAL DECOMP/DISCECTOMY FUSION N/A 02/21/2018   Procedure: Cervical Five-Six Cervical Six-Seven Anterior cervical decompression/discectomy/fusion;  Surgeon: Ashok Pall, MD;  Location: [place];  Service: Neurosurgery;  Laterality: N/A;  Cervical Five-Six Cervical Six-Seven Anterior cervical decompression/discectomy/fusion   BIOPSY  10/09/2019   Procedure: BIOPSY;  Surgeon: Ronnette Juniper, MD;  [facility];  Service: Gastroenterology;;   BLADDER SURGERY     CARPAL TUNNEL RELEASE  Right 02/11/2019   Procedure: Right Carpal Tunnel Wound Irrigation;  Surgeon: Ashok Pall, MD;  Location: [place];  Service: Neurosurgery;  Laterality: Right;  Right Carpal tunnel wound exploration/wash out   CARPAL TUNNEL RELEASE Bilateral    CHOLECYSTECTOMY  11/22/2012   CHOLECYSTECTOMY   11/22/2012   Procedure: LAPAROSCOPIC CHOLECYSTECTOMY;  Surgeon: Gwenyth Ober, MD;  Location: [place];  Service: General;;   COLONOSCOPY     COLONOSCOPY WITH PROPOFOL N/A 10/09/2019   Procedure: COLONOSCOPY WITH PROPOFOL;  Surgeon: Ronnette Juniper, MD;  [facility];  Service: Gastroenterology;  Laterality: N/A;   ESOPHAGOGASTRODUODENOSCOPY (EGD) WITH PROPOFOL N/A 10/09/2019   Procedure: ESOPHAGOGASTRODUODENOSCOPY (EGD) WITH PROPOFOL;  Surgeon: Ronnette Juniper, MD;  [facility];  Service: Gastroenterology;  Laterality: N/A;   POLYPECTOMY  10/09/2019   Procedure: POLYPECTOMY;  Surgeon: Ronnette Juniper, MD;  [facility];  Service: Gastroenterology;;    SOCIAL HISTORY: Social History   Socioeconomic History   Marital status: Married    Spouse name: Not on file   Number of children: Not on file   Years of education: Not on file   Highest education level: Not on file  Occupational History   Not on file  Tobacco Use   Smoking status: Former    Types: Cigarettes    Quit date: 04/12/1998    Years since quitting: 22.7   Smokeless tobacco: Never  Vaping Use   Vaping Use: Never used  Substance and Sexual Activity   Alcohol use: No   Drug use: No   Sexual activity: Not on file  Other Topics Concern   Not on file  Social History Narrative   Not on file   Social Determinants of Health   Financial Resource Strain: Not on file  Food Insecurity: Not on file  Transportation Needs: Not on file  Physical Activity: Not on file  Stress: Not on file  Social Connections: Not on file  Intimate Partner Violence: Not on file    FAMILY HISTORY: Family History  Problem Relation Age of Onset   Hypertension Mother    Diabetes Mother    Multiple myeloma Mother    Hypertension Father    Diabetes Father    Healthy Son    Healthy Daughter     ALLERGIES:  is allergic to other and statins.  MEDICATIONS:  Current Outpatient Medications  Medication Sig Dispense Refill    Adalimumab (HUMIRA PEN) 40 MG/0.4ML PNKT Inject 40 mg into the skin every 14 (fourteen) days. 2 each 0   ascorbic acid (VITAMIN C) 500 MG tablet Take 500 mg by mouth daily.     calcium-vitamin D (OSCAL WITH D) 500-200 MG-UNIT tablet Take 1 tablet by mouth daily.     cyanocobalamin 2000 MCG tablet Take 2,000 mcg by mouth daily.     ferrous sulfate 325 (65 FE) MG EC tablet Take 325 mg by mouth daily with breakfast.     finasteride (PROSCAR) 5 MG tablet Take 5 mg by mouth daily.     folic acid (FOLVITE) 1 MG tablet Take 2 tablets (2 mg total) by mouth daily. 180 tablet 3   Gauze Pads & Dressings 2"X2" PADS Applied daily with dressing changes. 21 each 0   methotrexate 2.5 MG tablet TAKE 8 TABLETS BY MOUTH ONCE WEEKLY *CAUTION: CHEMOTHERAPY. PROTECTION FROM LIGHT 96 tablet 0   Multiple Vitamins-Minerals (MULTIVITAMIN WITH MINERALS) tablet Take 1 tablet by mouth in the morning and at bedtime.     tamsulosin (FLOMAX) 0.4 MG CAPS  capsule Take 0.4 mg by mouth daily after supper.     VITAMIN D PO Take 50 mcg by mouth daily.     Vitamins A & D (VITAMIN A & D) ointment Apply 1 application topically daily as needed for dry skin.     No current facility-administered medications for this visit.     PHYSICAL EXAMINATION: ECOG PERFORMANCE STATUS: 1 - Symptomatic but completely ambulatory Vitals:   01/03/21 1036  BP: 107/64  Pulse: 87  Resp: 18  Temp: 98.1 F (36.7 C)  SpO2: 97%   Filed Weights   01/03/21 1036  Weight: (!) 309 lb 6.4 oz (140.3 kg)    Physical Exam Constitutional:      General: He is not in acute distress.    Appearance: He is obese.     Comments: Patient sits in the wheelchair.  HENT:     Head: Normocephalic and atraumatic.  Eyes:     General: No scleral icterus. Cardiovascular:     Rate and Rhythm: Normal rate and regular rhythm.     Heart sounds: Normal heart sounds.  Pulmonary:     Effort: Pulmonary effort is normal. No respiratory distress.     Breath sounds: No  wheezing.  Abdominal:     General: Bowel sounds are normal. There is no distension.     Palpations: Abdomen is soft.  Musculoskeletal:        General: No deformity. Normal range of motion.     Cervical back: Normal range of motion and neck supple.     Comments: Trace edema, bilateral low extremities.   Skin:    General: Skin is warm and dry.     Findings: No erythema or rash.  Neurological:     Mental Status: He is alert and oriented to person, place, and time. Mental status is at baseline.     Cranial Nerves: No cranial nerve deficit.     Coordination: Coordination normal.  Psychiatric:        Mood and Affect: Mood normal.     LABORATORY DATA:  I have reviewed the data as listed Lab Results  Component Value Date   WBC 9.7 12/27/2020   HGB 10.8 (L) 12/27/2020   HCT 33.7 (L) 12/27/2020   MCV 93.6 12/27/2020   PLT 267 12/27/2020   Recent Labs    03/04/20 1006 04/22/20 1144 06/07/20 1201 08/18/20 1056 12/27/20 1222  NA 137 135 139 135 134*  K 4.4 3.5 4.5 4.3 4.1  CL 102 100 103 103 102  CO2 _0 GLUCOSE 88 137* 89 94 98  BUN _1 CREATININE 0.88 0.82 0.86 0.92 0.91  CALCIUM 9.7 8.9 9.5 9.2 9.0  GFRNONAA 89 >60 90 >60 >60  GFRAA 103  --  104  --   --   PROT 8.2*  8.3* 8.2* 7.9 8.7* 8.5*  ALBUMIN  --  3.1*  --  3.6 3.5  AST _2 ALT 4* 10 4* 8 7  ALKPHOS  --  69  --  80 76  BILITOT 0.3 0.3 0.2 0.6 0.4    Iron/TIBC/Ferritin/ %Sat    Component Value Date/Time   IRON 31 (L) 04/08/2020 1509   TIBC 259 04/08/2020 1509   FERRITIN 452 (H) 04/08/2020 1509   IRONPCTSAT 12 (L) 04/08/2020 1509      RADIOGRAPHIC STUDIES: I have personally reviewed the radiological images as listed and agreed with the findings in  the report.  No results found.   ASSESSMENT & PLAN:  1. Smoldering multiple myeloma   2. Rheumatoid arthritis, involving unspecified site, unspecified whether rheumatoid factor present 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  27% plasma cell, cytogenetics - normal, and myeloma FISH panel is negative.  Standard risk.  Congo red negative. Labs are reviewed and discussed with patient. M protein 1.6, light chain ratio remains stable. Lamda light chain 77.4 Labs are reviewed and discussed with patient. He has normal kidney function and calcium level. Anemia is stable, likely due to RA treatments.  Obtain skeletal survey.  Continue watchful waiting.  Rheumatoid arthritis, follow up with rheumatology for methotrexate and Humira  Orders Placed This Encounter  Procedures   DG Bone Survey Met    Standing Status:   Future    Standing Expiration Date:   01/03/2022    Order Specific Question:   Reason for Exam (SYMPTOM  OR DIAGNOSIS REQUIRED)    Answer:   smoldering myeloma    Order Specific Question:   Preferred imaging location?    Answer:   [facility]   DG Bone Survey Met    Standing Status:   Future    Standing Expiration Date:   01/03/2022    Order Specific Question:   Reason for Exam (SYMPTOM  OR DIAGNOSIS REQUIRED)    Answer:   smoldering myeloma    Order Specific Question:   Preferred imaging location?    Answer:   [facility]   Comprehensive metabolic panel    Standing Status:   Future    Standing Expiration Date:   01/03/2022   CBC with Differential/Platelet    Standing Status:   Future    Standing Expiration Date:   01/03/2022   Ferritin    Standing Status:   Future    Standing Expiration Date:   01/03/2022   Iron and TIBC    Standing Status:    Future    Standing Expiration Date:   01/03/2022   Multiple Myeloma Panel (SPEP&IFE w/QIG)    Standing Status:   Future    Standing Expiration Date:   01/03/2022   Kappa/lambda light chains    Standing Status:   Future    Standing Expiration Date:   01/03/2022    All questions were answered. The patient knows to call the clinic with any problems questions or concerns.  Cc Seward Carol, MD  Return of visit: 3 months.   Earlie Server, MD, PhD 01/03/2021

## 2021-01-03 NOTE — Progress Notes (Signed)
Pt has chronic pain from RA in hands and left knee. Appetite is too good. Saturday night had diarrhea for 3 hours then constipation. Sounds like IBS. This is his usual. Run down, fatigued. No energy.

## 2021-01-04 ENCOUNTER — Telehealth: Payer: Self-pay | Admitting: Pharmacy Technician

## 2021-01-04 NOTE — Telephone Encounter (Signed)
Received approval for Patient Assistance from [facility] for Humira. Patient has been approved for assistance through 04/02/22.  Will send document to scan center.  Phone number is: [phone]

## 2021-01-06 ENCOUNTER — Ambulatory Visit
Admission: RE | Admit: 2021-01-06 | Discharge: 2021-01-06 | Disposition: A | Discharge status: Home / Self Care | Payer: Medicare Other | Source: Ambulatory Visit | Attending: Oncology | Admitting: Oncology

## 2021-01-06 ENCOUNTER — Other Ambulatory Visit: Payer: Self-pay

## 2021-01-06 DIAGNOSIS — C9000 Multiple myeloma not having achieved remission: Secondary | ICD-10-CM | POA: Diagnosis not present

## 2021-01-06 DIAGNOSIS — D472 Monoclonal gammopathy: Secondary | ICD-10-CM | POA: Insufficient documentation

## 2021-01-13 DIAGNOSIS — D638 Anemia in other chronic diseases classified elsewhere: Secondary | ICD-10-CM | POA: Diagnosis not present

## 2021-01-13 DIAGNOSIS — D472 Monoclonal gammopathy: Secondary | ICD-10-CM | POA: Diagnosis not present

## 2021-01-13 DIAGNOSIS — I10 Essential (primary) hypertension: Secondary | ICD-10-CM | POA: Diagnosis not present

## 2021-01-13 DIAGNOSIS — D509 Iron deficiency anemia, unspecified: Secondary | ICD-10-CM | POA: Diagnosis not present

## 2021-01-13 DIAGNOSIS — E7800 Pure hypercholesterolemia, unspecified: Secondary | ICD-10-CM | POA: Diagnosis not present

## 2021-01-13 DIAGNOSIS — G4730 Sleep apnea, unspecified: Secondary | ICD-10-CM | POA: Diagnosis not present

## 2021-01-13 DIAGNOSIS — M5000 Cervical disc disorder with myelopathy, unspecified cervical region: Secondary | ICD-10-CM | POA: Diagnosis not present

## 2021-01-20 ENCOUNTER — Other Ambulatory Visit: Payer: Self-pay

## 2021-01-20 MED ORDER — FOLIC ACID 1 MG PO TABS: 2.0000 mg | ORAL_TABLET | Freq: Every day | ORAL | 3 refills | Status: DC

## 2021-01-20 NOTE — Telephone Encounter (Signed)
Patient advised it is too early to refill MTX. Patient advised we can send it to the new pharmacy once it is due to be refilled. Patient advised to call us two weeks before he runs out of medication. Advised we would send the Folic Acid to the pharmacy.   Next Visit: 03/14/2021  Last Visit: 12/13/2020  Last Fill: 06/07/2020  Dx: Rheumatoid arthritis with rheumatoid factor of multiple sites without organ or systems involvement  Current Dose per office note on 7/44/5146: folic acid 2 mg by mouth daily.  Okay to refill Folic Acid at new pharmacy?

## 2021-01-20 NOTE — Telephone Encounter (Signed)
Patient called stating he would like to switch pharmacies from [place] to [facility] specialty pharmacy for his folic acid and Methotrexate.  Patient states he spoke with [facility] and was told he would not have a copay if his prescriptions were filled with them.

## 2021-02-28 DIAGNOSIS — R3915 Urgency of urination: Secondary | ICD-10-CM | POA: Diagnosis not present

## 2021-03-01 NOTE — Progress Notes (Signed)
Office Visit Note  Patient: Eric Cruz             Date of Birth: February 12, 1951           MRN: [id number]             PCP: Seward Carol, MD Referring: Seward Carol, MD Visit Date: 03/14/2021 Occupation: _0 @  Subjective:  Medication management   History of Present Illness: RANJIT ASHURST is a 70 y.o. male with a history of seropositive rheumatoid arthritis and osteoarthritis.  He has been taking methotrexate and Humira on a regular basis.  He states he has noticed gradual improvement in his symptoms.  He is not walking with the assistance anymore.  He continues to have some discomfort in his left knee joint that night.  He states yesterday he pushed something in his truck and since then he has been having some discomfort in his left wrist joint.  Activities of Daily Living:  Patient reports morning stiffness for 30 minutes.   Patient Reports nocturnal pain.  Difficulty dressing/grooming: Denies Difficulty climbing stairs: Denies Difficulty getting out of chair: Reports Difficulty using hands for taps, buttons, cutlery, and/or writing: Reports  Review of Systems  Constitutional:  Negative for fatigue and night sweats.  HENT:  Negative for mouth sores, mouth dryness and nose dryness.   Eyes:  Negative for redness and dryness.  Respiratory:  Negative for shortness of breath and difficulty breathing.   Cardiovascular:  Positive for swelling in legs/feet. Negative for chest pain, palpitations, hypertension and irregular heartbeat.  Gastrointestinal:  Positive for constipation and diarrhea.  Endocrine: Positive for excessive thirst and increased urination.  Genitourinary:  Negative for difficulty urinating.  Musculoskeletal:  Positive for joint pain, gait problem, joint pain, joint swelling and morning stiffness. Negative for myalgias, muscle weakness, muscle tenderness and myalgias.  Skin:  Negative for color change, rash, hair loss, nodules/bumps, skin tightness,  ulcers and sensitivity to sunlight.  Allergic/Immunologic: Positive for susceptible to infections.  Neurological:  Negative for dizziness, fainting, numbness, memory loss, night sweats and weakness ( ).  Hematological:  Negative for bruising/bleeding tendency and swollen glands.  Psychiatric/Behavioral:  Positive for sleep disturbance. Negative for depressed mood. The patient is not nervous/anxious.    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    Depression    Dizziness    GERD (gastroesophageal reflux disease)    Gout    High cholesterol    Hypertension    Sleep apnea    has cpap - not currently wearing   Varicose veins     Family History  Problem Relation Age of Onset   Hypertension Mother    Diabetes Mother    Multiple myeloma Mother  Hypertension Father    Diabetes Father    Healthy Son    Healthy Daughter    Past Surgical History:  Procedure Laterality Date   ANTERIOR CERVICAL DECOMP/DISCECTOMY FUSION N/A 02/21/2018   Procedure: Cervical Five-Six Cervical Six-Seven Anterior cervical decompression/discectomy/fusion;  Surgeon: Ashok Pall, MD;  Location: [place];  Service: Neurosurgery;  Laterality: N/A;  Cervical Five-Six  Cervical Six-Seven Anterior cervical decompression/discectomy/fusion   BIOPSY  10/09/2019   Procedure: BIOPSY;  Surgeon: Ronnette Juniper, MD;  [facility];  Service: Gastroenterology;;   BLADDER SURGERY     CARPAL TUNNEL RELEASE Right 02/11/2019   Procedure: Right Carpal Tunnel Wound Irrigation;  Surgeon: Ashok Pall, MD;  Location: [place];  Service: Neurosurgery;  Laterality: Right;  Right Carpal tunnel wound exploration/wash out   CARPAL TUNNEL RELEASE Bilateral    CHOLECYSTECTOMY  11/22/2012   CHOLECYSTECTOMY  11/22/2012   Procedure: LAPAROSCOPIC CHOLECYSTECTOMY;  Surgeon: Gwenyth Ober, MD;  Location: [place];  Service: General;;   COLONOSCOPY     COLONOSCOPY WITH PROPOFOL N/A 10/09/2019   Procedure: COLONOSCOPY WITH PROPOFOL;  Surgeon: Ronnette Juniper, MD;  [facility];  Service: Gastroenterology;  Laterality: N/A;   ESOPHAGOGASTRODUODENOSCOPY (EGD) WITH PROPOFOL N/A 10/09/2019   Procedure: ESOPHAGOGASTRODUODENOSCOPY (EGD) WITH PROPOFOL;  Surgeon: Ronnette Juniper, MD;  [facility];  Service: Gastroenterology;  Laterality: N/A;   POLYPECTOMY  10/09/2019   Procedure: POLYPECTOMY;  Surgeon: Ronnette Juniper, MD;  [facility];  Service: Gastroenterology;;   Social History   Social History Narrative   Not on file   Immunization History  Administered Date(s) Administered   PFIZER(Purple Top)SARS-COV-2 Vaccination 03/04/2020, 04/05/2020, 09/14/2020     Objective: Vital Signs: BP (!) 154/74 (BP Location: Left Arm, Patient Position: Sitting, Cuff Size: Normal)   Pulse (!) 103   Resp 17   Ht _0  (1.727 m)   Wt (!) 312 lb 3.2 oz (141.6 kg)   BMI 47.47 kg/m    Physical Exam Vitals and nursing note reviewed.  Constitutional:      Appearance: He is well-developed.  HENT:     Head: Normocephalic and atraumatic.  Eyes:     Conjunctiva/sclera: Conjunctivae normal.     Pupils: Pupils are equal, round, and reactive to light.  Cardiovascular:     Rate and Rhythm:  Normal rate and regular rhythm.     Heart sounds: Normal heart sounds.  Pulmonary:     Effort: Pulmonary effort is normal.     Breath sounds: Normal breath sounds.  Abdominal:     General: Bowel sounds are normal.     Palpations: Abdomen is soft.  Musculoskeletal:     Cervical back: Normal range of motion and neck supple.  Skin:    General: Skin is warm and dry.     Capillary Refill: Capillary refill takes less than 2 seconds.  Neurological:     Mental Status: He is alert and oriented to person, place, and time.  Psychiatric:        Behavior: Behavior normal.     Musculoskeletal Exam: He had discomfort and limited range of motion of his cervical spine.  Shoulder joints were in good range of motion.  He has contracture in his right elbow joint with no synovitis.  Left elbow joint was in full range of motion.  He had tenderness on palpation of his left wrist joint with no synovitis.  There was no synovitis over MCPs PIPs or DIPs.  Hip joints were in good range of motion.  Knee joints in good range of motion without any warmth swelling or effusion.  There was no tenderness over ankles or MTPs.  CDAI Exam: CDAI Score: 1.8  Patient Global: 4 mm; Provider Global: 4 mm Swollen: 0 ; Tender: 1  Joint Exam 03/14/2021      Right  Left  Knee      Tender     Investigation: No additional findings.  Imaging: No results found.  Recent Labs: Lab Results  Component Value Date   WBC 9.7 12/27/2020   HGB 10.8 (L) 12/27/2020   PLT 267 12/27/2020   NA 134 (L) 12/27/2020   K 4.1 12/27/2020   CL 102 12/27/2020   CO2 25 12/27/2020   GLUCOSE 98 12/27/2020   BUN 14 12/27/2020   CREATININE 0.91 12/27/2020   BILITOT 0.4 12/27/2020   ALKPHOS 76 12/27/2020   AST 18 12/27/2020   ALT 7 12/27/2020   PROT 8.5 (H) 12/27/2020   ALBUMIN 3.5 12/27/2020   CALCIUM 9.0 12/27/2020   GFRAA 104 06/07/2020   QFTBGOLDPLUS NEGATIVE 03/04/2020   01/03/2021    Speciality Comments: No specialty comments  available.  Procedures:  No procedures performed Allergies: Atorvastatin, Simvastatin, Other, and Statins   Assessment / Plan:     Visit Diagnoses: Rheumatoid arthritis with rheumatoid factor of multiple sites without organ or systems involvement (HCC) - Positive RF, positive anti-CCP, elevated sed rate, synovitis involving multiple joints:  -He continues to improve on Humira and methotrexate combination.  He has been tolerating medications well.  He experiences some fatigue after the methotrexate dose.  Prescription refills were given today.  Plan: Adalimumab (HUMIRA PEN) 40 MG/0.4ML PNKT, methotrexate 2.5 MG tablet  High risk medication use - Humira 40 mg sq injections every 14 days, methotrexate 8  tablets by mouth once weekly and folic acid 2 mg by mouth daily. - Plan: CBC with Differential/Platelet, COMPLETE METABOLIC PANEL WITH GFR, QuantiFERON-TB Gold Plus today.  He will get CBC and CMP every 3 months.  Information on immunization was placed in the AVS.  He was also advised to stop Humira and methotrexate in case he develops an infection and restart after the infection resolves.  Yearly skin examination to screen for nonmelanoma skin cancer was advised  Arthritis of both acromioclavicular joints-he continues to have some stiffness.  Contracture of right elbow - Unchanged.  No synovitis was noted.  Left wrist sprain initial encounter-he sprained his wrist yesterday.  He had tenderness but no synovitis was noted.  Topical analgesics were advised.  HNP (herniated nucleus pulposus) with myelopathy, cervical-he had limited range of motion with some stiffness.  History of bilateral carpal tunnel release  Multiple myeloma not having achieved remission ([place]) - Followed by Dr. Tasia Catchings who recommended watchful waiting at this time.  He underwent a PET scan on 05/20/20.   Symptomatic cholelithiasis  Orders: Orders Placed This Encounter  Procedures   CBC with Differential/Platelet   COMPLETE  METABOLIC PANEL WITH GFR   QuantiFERON-TB Gold Plus    Meds ordered this encounter  Medications   Adalimumab (HUMIRA PEN) 40 MG/0.4ML PNKT    Sig: Inject 40 mg into the skin every 14 (fourteen) days.    Dispense:  2 each    Refill:  0    1 kit = 2 pen   folic acid (FOLVITE) 1 MG tablet    Sig: Take 2 tablets (2 mg total) by mouth daily.    Dispense:  180 tablet    Refill:  3  methotrexate 2.5 MG tablet    Sig: TAKE 8 TABLETS BY MOUTH ONCE WEEKLY *CAUTION: CHEMOTHERAPY. PROTECTION FROM LIGHT    Dispense:  96 tablet    Refill:  0     Follow-Up Instructions: Return in 5 months (on 08/12/2021) for Rheumatoid arthritis.   Bo Merino, MD  Note - This record has been created using Editor, commissioning.  Chart creation errors have been sought, but may not always  have been located. Such creation errors do not reflect on  the standard of medical care.

## 2021-03-14 ENCOUNTER — Other Ambulatory Visit: Payer: Self-pay

## 2021-03-14 ENCOUNTER — Ambulatory Visit: Payer: Medicare Other | Admitting: Rheumatology

## 2021-03-14 ENCOUNTER — Encounter: Payer: Self-pay | Admitting: Rheumatology

## 2021-03-14 VITALS — BP 137/82 | HR 78 | Resp 17 | Ht 68.0 in | Wt 312.2 lb

## 2021-03-14 DIAGNOSIS — M24521 Contracture, right elbow: Secondary | ICD-10-CM

## 2021-03-14 DIAGNOSIS — C9000 Multiple myeloma not having achieved remission: Secondary | ICD-10-CM | POA: Diagnosis not present

## 2021-03-14 DIAGNOSIS — M0579 Rheumatoid arthritis with rheumatoid factor of multiple sites without organ or systems involvement: Secondary | ICD-10-CM | POA: Diagnosis not present

## 2021-03-14 DIAGNOSIS — M19011 Primary osteoarthritis, right shoulder: Secondary | ICD-10-CM

## 2021-03-14 DIAGNOSIS — Z9889 Other specified postprocedural states: Secondary | ICD-10-CM

## 2021-03-14 DIAGNOSIS — M19012 Primary osteoarthritis, left shoulder: Secondary | ICD-10-CM | POA: Diagnosis not present

## 2021-03-14 DIAGNOSIS — S63502A Unspecified sprain of left wrist, initial encounter: Secondary | ICD-10-CM

## 2021-03-14 DIAGNOSIS — K8020 Calculus of gallbladder without cholecystitis without obstruction: Secondary | ICD-10-CM | POA: Diagnosis not present

## 2021-03-14 DIAGNOSIS — Z79899 Other long term (current) drug therapy: Secondary | ICD-10-CM

## 2021-03-14 DIAGNOSIS — M5000 Cervical disc disorder with myelopathy, unspecified cervical region: Secondary | ICD-10-CM | POA: Diagnosis not present

## 2021-03-14 MED ORDER — FOLIC ACID 1 MG PO TABS: 2.0000 mg | ORAL_TABLET | Freq: Every day | ORAL | 3 refills | Status: DC

## 2021-03-14 MED ORDER — HUMIRA (2 PEN) 40 MG/0.4ML ~~LOC~~ AJKT: 40.0000 mg | AUTO-INJECTOR | SUBCUTANEOUS | 0 refills | Status: DC

## 2021-03-14 MED ORDER — METHOTREXATE SODIUM 2.5 MG PO TABS: ORAL_TABLET | ORAL | 0 refills | Status: DC

## 2021-03-14 NOTE — Patient Instructions (Addendum)
Standing Labs We placed an order today for your standing lab work.   Please have your standing labs drawn in March and every 3 months in  If possible, please have your labs drawn 2 weeks prior to your appointment so that the provider can discuss your results at your appointment.  Please note that you may see your imaging and lab results in [facility] before we have reviewed them. We may be awaiting multiple results to interpret others before contacting you. Please allow our office up to 72 hours to thoroughly review all of the results before contacting the office for clarification of your results.  We have open lab daily: Monday through Thursday from 1:30-4:30 PM and Friday from 1:30-4:00 PM at the office of Dr. Bo Merino, [facility].   Please be advised, all patients with office appointments requiring lab work will take precedent over walk-in lab work.  If possible, please come for your lab work on Monday and Friday afternoons, as you may experience shorter wait times. The office is located at [address] [place], [place], [place] [address] No appointment is necessary.   Labs are drawn by [facility]. Please bring your co-pay at the time of your lab draw.  You may receive a bill from [facility] for your lab work.  If you wish to have your labs drawn at another location, please call the office 24 hours in advance to send orders.  If you have any questions regarding directions or hours of operation,  please call [phone].   As a reminder, please drink plenty of water prior to coming for your lab work. Thanks!   Please get annual skin examination to screen for skin cancer while you are on Humira.  Vaccines You are taking a medication(s) that can suppress your immune system.  The following immunizations are recommended: Flu annually Covid-19  Td/Tdap (tetanus, diphtheria, pertussis) every 10 years Pneumonia (Prevnar 15 then Pneumovax 23 at least 1 year apart.   Alternatively, can take Prevnar 20 without needing additional dose) Shingrix: 2 doses from 4 weeks to 6 months apart  Please check with your PCP to make sure you are up to date.   If you have signs or symptoms of an infection or start antibiotics: First, call your PCP for workup of your infection. Hold your medication through the infection, until you complete your antibiotics, and until symptoms resolve if you take the following: Injectable medication (Actemra, Benlysta, Cimzia, Cosentyx, Enbrel, Humira, Kevzara, Orencia, Remicade, Simponi, Stelara, Taltz, Tremfya) Methotrexate Leflunomide (Arava) Mycophenolate (Cellcept) Morrie Sheldon, Olumiant, or Rinvoq

## 2021-03-15 ENCOUNTER — Telehealth: Payer: Self-pay

## 2021-03-15 DIAGNOSIS — M0579 Rheumatoid arthritis with rheumatoid factor of multiple sites without organ or systems involvement: Secondary | ICD-10-CM

## 2021-03-15 MED ORDER — HUMIRA (2 PEN) 40 MG/0.4ML ~~LOC~~ AJKT: 40.0000 mg | AUTO-INJECTOR | SUBCUTANEOUS | 0 refills | Status: DC

## 2021-03-15 NOTE — Progress Notes (Signed)
Anemia is better.  Total albumin is mildly elevated and stable.

## 2021-03-15 NOTE — Telephone Encounter (Signed)
Patient called stating his Humira prescription was sent to [facility] and they don't fill that prescription.  It needs to be sent to [facility].

## 2021-03-15 NOTE — Telephone Encounter (Signed)
Prescription resent to the requested pharmacy.

## 2021-03-16 LAB — CBC WITH DIFFERENTIAL/PLATELET
Absolute Monocytes: 454 cells/uL (ref 200–950)
Basophils Absolute: 18 cells/uL (ref 0–200)
Basophils Relative: 0.2 %
Eosinophils Absolute: 187 cells/uL (ref 15–500)
Eosinophils Relative: 2.1 %
HCT: 35.3 % — ABNORMAL LOW (ref 38.5–50.0)
Hemoglobin: 11.7 g/dL — ABNORMAL LOW (ref 13.2–17.1)
Lymphs Abs: 2056 cells/uL (ref 850–3900)
MCH: 31 pg (ref 27.0–33.0)
MCHC: 33.1 g/dL (ref 32.0–36.0)
MCV: 93.4 fL (ref 80.0–100.0)
MPV: 11.1 fL (ref 7.5–12.5)
Monocytes Relative: 5.1 %
Neutro Abs: 6186 cells/uL (ref 1500–7800)
Neutrophils Relative %: 69.5 %
Platelets: 261 10*3/uL (ref 140–400)
RBC: 3.78 10*6/uL — ABNORMAL LOW (ref 4.20–5.80)
RDW: 16.4 % — ABNORMAL HIGH (ref 11.0–15.0)
Total Lymphocyte: 23.1 %
WBC: 8.9 10*3/uL (ref 3.8–10.8)

## 2021-03-16 LAB — COMPLETE METABOLIC PANEL WITH GFR
AG Ratio: 1 (calc) (ref 1.0–2.5)
ALT: 7 U/L — ABNORMAL LOW (ref 9–46)
AST: 16 U/L (ref 10–35)
Albumin: 4 g/dL (ref 3.6–5.1)
Alkaline phosphatase (APISO): 81 U/L (ref 35–144)
BUN: 14 mg/dL (ref 7–25)
CO2: 28 mmol/L (ref 20–32)
Calcium: 9.3 mg/dL (ref 8.6–10.3)
Chloride: 102 mmol/L (ref 98–110)
Creat: 1.07 mg/dL (ref 0.70–1.35)
Globulin: 4.2 g/dL (calc) — ABNORMAL HIGH (ref 1.9–3.7)
Glucose, Bld: 82 mg/dL (ref 65–99)
Potassium: 4.5 mmol/L (ref 3.5–5.3)
Sodium: 138 mmol/L (ref 135–146)
Total Bilirubin: 0.3 mg/dL (ref 0.2–1.2)
Total Protein: 8.2 g/dL — ABNORMAL HIGH (ref 6.1–8.1)
eGFR: 76 mL/min/{1.73_m2} (ref >=60)

## 2021-03-16 LAB — QUANTIFERON-TB GOLD PLUS
Mitogen-NIL: >10 IU/mL
NIL: 0.05 IU/mL
QuantiFERON-TB Gold Plus: NEGATIVE
TB1-NIL: 0.01 IU/mL
TB2-NIL: 0.02 IU/mL

## 2021-03-16 NOTE — Progress Notes (Signed)
TB Gold is negative.

## 2021-03-22 DIAGNOSIS — I10 Essential (primary) hypertension: Secondary | ICD-10-CM | POA: Diagnosis not present

## 2021-03-22 DIAGNOSIS — G4700 Insomnia, unspecified: Secondary | ICD-10-CM | POA: Diagnosis not present

## 2021-03-22 DIAGNOSIS — E782 Mixed hyperlipidemia: Secondary | ICD-10-CM | POA: Diagnosis not present

## 2021-03-22 DIAGNOSIS — E7800 Pure hypercholesterolemia, unspecified: Secondary | ICD-10-CM | POA: Diagnosis not present

## 2021-03-29 ENCOUNTER — Other Ambulatory Visit: Payer: Self-pay | Admitting: *Deleted

## 2021-03-29 DIAGNOSIS — D472 Monoclonal gammopathy: Secondary | ICD-10-CM

## 2021-04-05 ENCOUNTER — Ambulatory Visit: Payer: Medicare Other

## 2021-04-05 ENCOUNTER — Inpatient Hospital Stay: Payer: Medicare Other | Attending: Oncology

## 2021-04-05 ENCOUNTER — Other Ambulatory Visit: Payer: Self-pay

## 2021-04-05 DIAGNOSIS — I10 Essential (primary) hypertension: Secondary | ICD-10-CM | POA: Insufficient documentation

## 2021-04-05 DIAGNOSIS — M069 Rheumatoid arthritis, unspecified: Secondary | ICD-10-CM | POA: Diagnosis not present

## 2021-04-05 DIAGNOSIS — C9000 Multiple myeloma not having achieved remission: Secondary | ICD-10-CM | POA: Insufficient documentation

## 2021-04-05 DIAGNOSIS — Z794 Long term (current) use of insulin: Secondary | ICD-10-CM | POA: Insufficient documentation

## 2021-04-05 DIAGNOSIS — Z79899 Other long term (current) drug therapy: Secondary | ICD-10-CM | POA: Diagnosis not present

## 2021-04-05 DIAGNOSIS — Z87891 Personal history of nicotine dependence: Secondary | ICD-10-CM | POA: Diagnosis not present

## 2021-04-05 DIAGNOSIS — D472 Monoclonal gammopathy: Secondary | ICD-10-CM

## 2021-04-05 DIAGNOSIS — D649 Anemia, unspecified: Secondary | ICD-10-CM | POA: Insufficient documentation

## 2021-04-05 LAB — CBC WITH DIFFERENTIAL/PLATELET
Abs Immature Granulocytes: 0.03 10*3/uL (ref 0.00–0.07)
Basophils Absolute: 0 10*3/uL (ref 0.0–0.1)
Basophils Relative: 0 %
Eosinophils Absolute: 0.2 10*3/uL (ref 0.0–0.5)
Eosinophils Relative: 3 %
HCT: 34.2 % — ABNORMAL LOW (ref 39.0–52.0)
Hemoglobin: 11 g/dL — ABNORMAL LOW (ref 13.0–17.0)
Immature Granulocytes: 0 %
Lymphocytes Relative: 32 %
Lymphs Abs: 2.6 10*3/uL (ref 0.7–4.0)
MCH: 30.8 pg (ref 26.0–34.0)
MCHC: 32.2 g/dL (ref 30.0–36.0)
MCV: 95.8 fL (ref 80.0–100.0)
Monocytes Absolute: 0.8 10*3/uL (ref 0.1–1.0)
Monocytes Relative: 10 %
Neutro Abs: 4.4 10*3/uL (ref 1.7–7.7)
Neutrophils Relative %: 55 %
Platelets: 224 10*3/uL (ref 150–400)
RBC: 3.57 MIL/uL — ABNORMAL LOW (ref 4.22–5.81)
RDW: 16.9 % — ABNORMAL HIGH (ref 11.5–15.5)
WBC: 8.2 10*3/uL (ref 4.0–10.5)
nRBC: 0 % (ref 0.0–0.2)

## 2021-04-05 LAB — COMPREHENSIVE METABOLIC PANEL
ALT: 14 U/L (ref 0–44)
AST: 26 U/L (ref 15–41)
Albumin: 3.4 g/dL — ABNORMAL LOW (ref 3.5–5.0)
Alkaline Phosphatase: 77 U/L (ref 38–126)
Anion gap: 9 (ref 5–15)
BUN: 13 mg/dL (ref 8–23)
CO2: 24 mmol/L (ref 22–32)
Calcium: 8.9 mg/dL (ref 8.9–10.3)
Chloride: 102 mmol/L (ref 98–111)
Creatinine, Ser: 0.97 mg/dL (ref 0.61–1.24)
GFR, Estimated: >60 mL/min (ref >60)
Glucose, Bld: 120 mg/dL — ABNORMAL HIGH (ref 70–99)
Potassium: 4 mmol/L (ref 3.5–5.1)
Sodium: 135 mmol/L (ref 135–145)
Total Bilirubin: <0.1 mg/dL — ABNORMAL LOW (ref 0.3–1.2)
Total Protein: 8.2 g/dL — ABNORMAL HIGH (ref 6.5–8.1)

## 2021-04-05 LAB — IRON AND TIBC
Iron: 78 ug/dL (ref 45–182)
Saturation Ratios: 28 % (ref 17.9–39.5)
TIBC: 274 ug/dL (ref 250–450)
UIBC: 196 ug/dL

## 2021-04-05 LAB — FERRITIN: Ferritin: 425 ng/mL — ABNORMAL HIGH (ref 24–336)

## 2021-04-06 LAB — KAPPA/LAMBDA LIGHT CHAINS
Kappa free light chain: 23.9 mg/L — ABNORMAL HIGH (ref 3.3–19.4)
Kappa, lambda light chain ratio: 0.36 (ref 0.26–1.65)
Lambda free light chains: 67.2 mg/L — ABNORMAL HIGH (ref 5.7–26.3)

## 2021-04-11 LAB — MULTIPLE MYELOMA PANEL, SERUM
Albumin SerPl Elph-Mcnc: 3.6 g/dL (ref 2.9–4.4)
Albumin/Glob SerPl: 0.9 (ref 0.7–1.7)
Alpha 1: 0.2 g/dL (ref 0.0–0.4)
Alpha2 Glob SerPl Elph-Mcnc: 0.6 g/dL (ref 0.4–1.0)
B-Globulin SerPl Elph-Mcnc: 2.3 g/dL — ABNORMAL HIGH (ref 0.7–1.3)
Gamma Glob SerPl Elph-Mcnc: 1.2 g/dL (ref 0.4–1.8)
Globulin, Total: 4.3 g/dL — ABNORMAL HIGH (ref 2.2–3.9)
IgA: 1993 mg/dL — ABNORMAL HIGH (ref 61–437)
IgG (Immunoglobin G), Serum: 939 mg/dL (ref 603–1613)
IgM (Immunoglobulin M), Srm: 67 mg/dL (ref 20–172)
M Protein SerPl Elph-Mcnc: 1.4 g/dL — ABNORMAL HIGH
Total Protein ELP: 7.9 g/dL (ref 6.0–8.5)

## 2021-04-12 ENCOUNTER — Encounter: Payer: Self-pay | Admitting: Oncology

## 2021-04-12 ENCOUNTER — Inpatient Hospital Stay: Payer: Medicare Other | Admitting: Oncology

## 2021-04-12 ENCOUNTER — Other Ambulatory Visit: Payer: Self-pay

## 2021-04-12 VITALS — BP 122/76 | HR 80 | Temp 97.1°F | Resp 18 | Wt 313.6 lb

## 2021-04-12 DIAGNOSIS — M069 Rheumatoid arthritis, unspecified: Secondary | ICD-10-CM

## 2021-04-12 DIAGNOSIS — Z794 Long term (current) use of insulin: Secondary | ICD-10-CM | POA: Diagnosis not present

## 2021-04-12 DIAGNOSIS — C9000 Multiple myeloma not having achieved remission: Secondary | ICD-10-CM | POA: Diagnosis not present

## 2021-04-12 DIAGNOSIS — I10 Essential (primary) hypertension: Secondary | ICD-10-CM | POA: Diagnosis not present

## 2021-04-12 DIAGNOSIS — D472 Monoclonal gammopathy: Secondary | ICD-10-CM

## 2021-04-12 DIAGNOSIS — D649 Anemia, unspecified: Secondary | ICD-10-CM | POA: Diagnosis not present

## 2021-04-12 DIAGNOSIS — Z87891 Personal history of nicotine dependence: Secondary | ICD-10-CM | POA: Diagnosis not present

## 2021-04-12 DIAGNOSIS — Z79899 Other long term (current) drug therapy: Secondary | ICD-10-CM | POA: Diagnosis not present

## 2021-04-12 NOTE — Progress Notes (Signed)
Hematology/Oncology follow-up note  [phone] [phone]   Patient Care Team: Seward Carol, MD as PCP - General (Internal Medicine)  REFERRING PROVIDER: Seward Carol, MD  CHIEF COMPLAINTS/REASON FOR VISIT:  Follow-up for multiple myeloma  HISTORY OF PRESENTING ILLNESS:  Eric Cruz is a 71 y.o. male who was seen in consultation at the request of Seward Carol, MD for evaluation of abnormal SPEP results, anemia. .   Patient recently had work up done at rheumatologist's office and was diagnosed with rheumatoid arthritis and patient was recommended to start MTX. He has not started yet Labs reviewed,  03/04/20 SPEP showed abnormal protein band of 1.7g/dl.,   , and IFE showed IgA Lamda monoclonal protein.  He has had weight loss last year, no weight loss and some weight gain during the past 6 months.   Multiple join pain.   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  27% plasma cell, cytogenetics - normal, and myeloma FISH panel is negative.  Standard risk.  Congo red staining was added and was negative. normal kidney function, calcium level, hemoglobin above 10 and no bone lesions. albumin is decreased at 3.1, slightly elevated beta microglobulin.  Stage II, anemia with hemoglobin above 10.   INTERVAL HISTORY Eric Cruz is a 71 y.o. male who has above history reviewed by me today presents for follow up visit for management of multiple myeloma Problems and complaints are listed below: He has chronic RA symptoms.  Currently he is on methotrexate weekly and Humira.  Patient reports feeling better recently.  Gripping strength has improved.  No new complaints.   Review of Systems  Constitutional:  Positive for fatigue. Negative for appetite change, chills, fever and unexpected weight change.  HENT:   Negative for hearing loss and voice change.    Eyes:  Negative for eye problems and icterus.  Respiratory:  Negative for chest tightness, cough and shortness of breath.   Cardiovascular:  Negative for chest pain and leg swelling.  Gastrointestinal:  Negative for abdominal distention and abdominal pain.  Endocrine: Negative for hot flashes.  Genitourinary:  Negative for difficulty urinating, dysuria and frequency.   Musculoskeletal:  Positive for arthralgias and back pain.  Skin:  Negative for itching and rash.  Neurological:  Negative for light-headedness and numbness.  Hematological:  Negative for adenopathy. Does not bruise/bleed easily.  Psychiatric/Behavioral:  Negative for confusion.     MEDICAL HISTORY:  Past Medical History:  Diagnosis Date   Arthritis    BPH (benign prostatic hyperplasia)    Cancer (HCC)    Prostate: states he had a positive biopsy but had another one a year later and it was gone.    Depression    Dizziness    GERD (gastroesophageal reflux disease)    Gout    High cholesterol    Hypertension    Sleep apnea    has cpap - not currently wearing   Varicose veins     SURGICAL HISTORY: Past Surgical History:  Procedure Laterality Date   ANTERIOR CERVICAL DECOMP/DISCECTOMY FUSION N/A 02/21/2018   Procedure: Cervical Five-Six Cervical Six-Seven Anterior cervical decompression/discectomy/fusion;  Surgeon: Ashok Pall, MD;  Location: [facility];  Service: Neurosurgery;  Laterality: N/A;  Cervical Five-Six Cervical Six-Seven Anterior cervical decompression/discectomy/fusion   BIOPSY  10/09/2019   Procedure: BIOPSY;  Surgeon: Ronnette Juniper, MD;  Location: [facility];  Service: Gastroenterology;;   BLADDER SURGERY  CARPAL TUNNEL RELEASE Right 02/11/2019   Procedure: Right Carpal Tunnel Wound Irrigation;  Surgeon: Ashok Pall, MD;  Location: [facility];  Service: Neurosurgery;  Laterality: Right;  Right Carpal tunnel wound exploration/wash out   CARPAL TUNNEL RELEASE Bilateral    CHOLECYSTECTOMY  11/22/2012    CHOLECYSTECTOMY  11/22/2012   Procedure: LAPAROSCOPIC CHOLECYSTECTOMY;  Surgeon: Gwenyth Ober, MD;  Location: [facility];  Service: General;;   COLONOSCOPY     COLONOSCOPY WITH PROPOFOL N/A 10/09/2019   Procedure: COLONOSCOPY WITH PROPOFOL;  Surgeon: Ronnette Juniper, MD;  Location: [facility];  Service: Gastroenterology;  Laterality: N/A;   ESOPHAGOGASTRODUODENOSCOPY (EGD) WITH PROPOFOL N/A 10/09/2019   Procedure: ESOPHAGOGASTRODUODENOSCOPY (EGD) WITH PROPOFOL;  Surgeon: Ronnette Juniper, MD;  Location: [facility];  Service: Gastroenterology;  Laterality: N/A;   POLYPECTOMY  10/09/2019   Procedure: POLYPECTOMY;  Surgeon: Ronnette Juniper, MD;  Location: [facility];  Service: Gastroenterology;;    SOCIAL HISTORY: Social History   Socioeconomic History   Marital status: Married    Spouse name: Not on file   Number of children: Not on file   Years of education: Not on file   Highest education level: Not on file  Occupational History   Not on file  Tobacco Use   Smoking status: Former    Packs/day: 1.00    Years: 31.00    Pack years: 31.00    Types: Cigarettes    Quit date: 04/12/1998    Years since quitting: 23.0   Smokeless tobacco: Never  Vaping Use   Vaping Use: Never used  Substance and Sexual Activity   Alcohol use: No   Drug use: No   Sexual activity: Not on file  Other Topics Concern   Not on file  Social History Narrative   Not on file   Social Determinants of Health   Financial Resource Strain: Not on file  Food Insecurity: Not on file  Transportation Needs: Not on file  Physical Activity: Not on file  Stress: Not on file  Social Connections: Not on file  Intimate Partner Violence: Not on file    FAMILY HISTORY: Family History  Problem Relation Age of Onset   Hypertension Mother    Diabetes Mother    Multiple myeloma Mother    Hypertension Father    Diabetes Father    Healthy Son    Healthy Daughter     ALLERGIES:  is allergic to atorvastatin, simvastatin, other,  and statins.  MEDICATIONS:  Current Outpatient Medications  Medication Sig Dispense Refill   Adalimumab (HUMIRA PEN) 40 MG/0.4ML PNKT Inject 40 mg into the skin every 14 (fourteen) days. 2 each 0   ascorbic acid (VITAMIN C) 500 MG tablet Take 500 mg by mouth daily.     calcium-vitamin D (OSCAL WITH D) 500-200 MG-UNIT tablet Take 1 tablet by mouth daily.     cyanocobalamin 2000 MCG tablet Take 2,000 mcg by mouth daily.     ferrous sulfate 325 (65 FE) MG EC tablet Take 325 mg by mouth daily with breakfast.     finasteride (PROSCAR) 5 MG tablet Take 5 mg by mouth daily.     folic acid (FOLVITE) 1 MG tablet Take 2 tablets (2 mg total) by mouth daily. 180 tablet 3   Gauze Pads & Dressings 2"X2" PADS Applied daily with dressing changes. 21 each 0   methotrexate 2.5 MG tablet TAKE 8 TABLETS BY MOUTH ONCE WEEKLY *CAUTION: CHEMOTHERAPY. PROTECTION FROM LIGHT 96 tablet 0   Multiple Vitamins-Minerals (MULTIVITAMIN WITH MINERALS)  tablet Take 1 tablet by mouth in the morning and at bedtime.     tamsulosin (FLOMAX) 0.4 MG CAPS capsule Take 0.4 mg by mouth daily after supper.     VITAMIN D PO Take 50 mcg by mouth daily.     Vitamins A & D (VITAMIN A & D) ointment Apply 1 application topically daily as needed for dry skin.     No current facility-administered medications for this visit.     PHYSICAL EXAMINATION: ECOG PERFORMANCE STATUS: 1 - Symptomatic but completely ambulatory Vitals:   04/12/21 1020  BP: 122/76  Pulse: 80  Resp: 18  Temp: (!) 97.1 F (36.2 C)   Filed Weights   04/12/21 1020  Weight: (!) 313 lb 9.6 oz (142.2 kg)    Physical Exam Constitutional:      General: He is not in acute distress.    Appearance: He is obese.     Comments: Patient sits in the wheelchair.  HENT:     Head: Normocephalic and atraumatic.  Eyes:     General: No scleral icterus. Cardiovascular:     Rate and Rhythm: Normal rate and regular rhythm.     Heart sounds: Normal heart sounds.  Pulmonary:      Effort: Pulmonary effort is normal. No respiratory distress.     Breath sounds: No wheezing.  Abdominal:     General: Bowel sounds are normal. There is no distension.     Palpations: Abdomen is soft.  Musculoskeletal:        General: No deformity. Normal range of motion.     Cervical back: Normal range of motion and neck supple.     Comments: Trace edema, bilateral low extremities.   Skin:    General: Skin is warm and dry.     Findings: No erythema or rash.  Neurological:     Mental Status: He is alert and oriented to person, place, and time. Mental status is at baseline.     Cranial Nerves: No cranial nerve deficit.     Coordination: Coordination normal.  Psychiatric:        Mood and Affect: Mood normal.     LABORATORY DATA:  I have reviewed the data as listed Lab Results  Component Value Date   WBC 8.2 04/05/2021   HGB 11.0 (L) 04/05/2021   HCT 34.2 (L) 04/05/2021   MCV 95.8 04/05/2021   PLT 224 04/05/2021   Recent Labs    06/07/20 1201 08/18/20 1056 12/27/20 1222 03/14/21 1218 04/05/21 1008  NA 139 135 134* 138 135  K 4.5 4.3 4.1 4.5 4.0  CL 103 103 102 102 102  CO2 '25 24 25 28 24  ' GLUCOSE 89 94 98 82 120*  BUN '11 13 14 14 13  ' CREATININE 0.86 0.92 0.91 1.07 0.97  CALCIUM 9.5 9.2 9.0 9.3 8.9  GFRNONAA 90 >60 >60  --  >60  GFRAA 104  --   --   --   --   PROT 7.9 8.7* 8.5* 8.2* 8.2*  ALBUMIN  --  3.6 3.5  --  3.4*  AST '13 17 18 16 26  ' ALT 4* 8 7 7* 14  ALKPHOS  --  80 76  --  77  BILITOT 0.2 0.6 0.4 0.3 <0.1*    Iron/TIBC/Ferritin/ %Sat    Component Value Date/Time   IRON 78 04/05/2021 1008   TIBC 274 04/05/2021 1008   FERRITIN 425 (H) 04/05/2021 1008   IRONPCTSAT 28 04/05/2021 1008  RADIOGRAPHIC STUDIES: I have personally reviewed the radiological images as listed and agreed with the findings in the report.  No results found.   ASSESSMENT & PLAN:  1. Smoldering multiple myeloma   2. Rheumatoid arthritis, involving unspecified site,  unspecified whether rheumatoid factor present ([place])   3. 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  27% plasma cell, cytogenetics - normal, and myeloma FISH panel is negative.  Standard risk.  Congo red negative. Labs reviewed and discussed with patient. Protein has been stable and slightly decreased to 1.4.  Light chain ratio remained stable and normal. Hemoglobin is stable.  Normal kidney function and calcium level.  Skeletal survey was negative.  Discussed with patient that he remains in smoldering multiple myeloma, continue close monitor. Repeat blood work in 3 months.  Consider repeat bone marrow biopsy in 2023.  Normocytic anemia, secondary to chronic disease and RA treatment Rheumatoid arthritis, follow up with rheumatology for methotrexate and Humira  Orders Placed This Encounter  Procedures   Comprehensive metabolic panel    Standing Status:   Future    Standing Expiration Date:   04/12/2022   CBC with Differential/Platelet    Standing Status:   Future    Standing Expiration Date:   04/12/2022   Kappa/lambda light chains    Standing Status:   Future    Standing Expiration Date:   04/12/2022   Multiple Myeloma Panel (SPEP&IFE w/QIG)    Standing Status:   Future    Standing Expiration Date:   04/12/2022    All questions were answered. The patient knows to call the clinic with any problems questions or concerns.  Cc Seward Carol, MD  Return of visit: 3 months.   Earlie Server, MD, PhD 04/12/2021

## 2021-04-12 NOTE — Progress Notes (Signed)
Pt here for follow up. Pt reports that he has popping joints and is unsure what could be causing it.

## 2021-05-09 ENCOUNTER — Other Ambulatory Visit: Payer: Self-pay

## 2021-05-09 DIAGNOSIS — M0579 Rheumatoid arthritis with rheumatoid factor of multiple sites without organ or systems involvement: Secondary | ICD-10-CM

## 2021-05-09 MED ORDER — HUMIRA (2 PEN) 40 MG/0.4ML ~~LOC~~ AJKT: 40.0000 mg | AUTO-INJECTOR | SUBCUTANEOUS | 2 refills | Status: DC

## 2021-05-09 NOTE — Telephone Encounter (Signed)
Next Visit: 08/12/2021  Last Visit: 03/14/2021  Last Fill: 03/15/2021 (30 day supply)  CY:[id number] arthritis with rheumatoid factor of multiple sites without organ or systems involvement   Current Dose per office note 03/14/2021: Humira 40 mg sq injections every 14 days  Labs: 04/05/2021 RBC 3.57, Hgb 11.0, Hct 34.2, RDW 16.9, Glucose 120, Total Protein 8.2, Albumin 3.4, Total Bilirubin <0.1  TB Gold: 03/14/2021 Neg   Okay to refill Humira?

## 2021-05-09 NOTE — Telephone Encounter (Signed)
Patient called requesting prescription refill of Humira to be sent to [facility].

## 2021-05-10 MED ORDER — HUMIRA (2 PEN) 40 MG/0.4ML ~~LOC~~ AJKT: 40.0000 mg | AUTO-INJECTOR | SUBCUTANEOUS | 2 refills | Status: DC

## 2021-05-10 NOTE — Addendum Note (Signed)
Addended by: Carole Binning on: 05/10/2021 10:05 AM   Modules accepted: Orders

## 2021-06-03 IMAGING — CT NM PET TUM IMG INITIAL (PI) WHOLE BODY
1 of 10 series · 2 of 25 positions shown · non-contrast
Comparison: None.

CLINICAL DATA: Subsequent treatment strategy for multiple myeloma.

EXAM:
NUCLEAR MEDICINE PET WHOLE BODY
TECHNIQUE: 14.4 mCi F-18 FDG was injected intravenously. Full-ring PET imaging
was performed from the head to foot after the radiotracer. CT data
was obtained and used for attenuation correction and anatomic
localization.
Fasting blood glucose: 80 mg/dl

[Series 3: ct wb 5.0 b30f · axial · 5.0mm · 0.98mm/px · z∈[-130,+474]mm · 2 of 603 slices shown]
[im 201/603  soft-tissue]
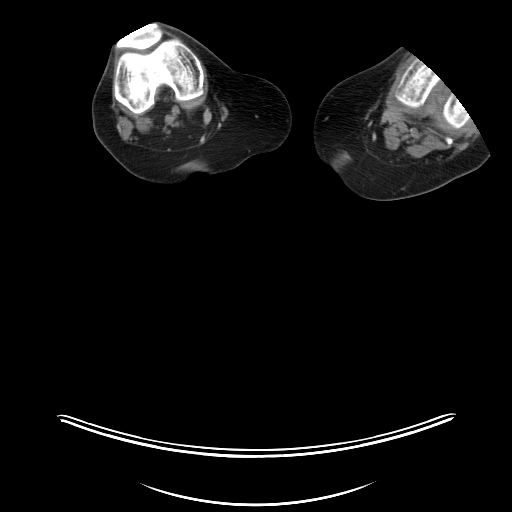
[im 402/603  soft-tissue]
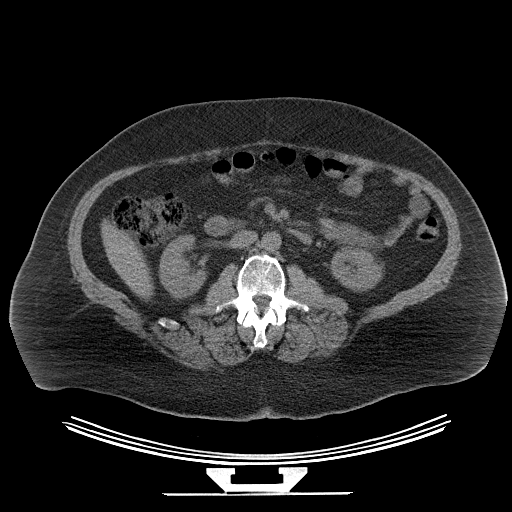

[2 of 25 positions shown; findings below may reference images not displayed]

FINDINGS: Mediastinal blood pool activity: SUV max

HEAD/NECK: No hypermetabolic activity in the scalp. No
hypermetabolic cervical lymph nodes.

Incidental CT findings: none

CHEST: Intense metabolic activity surrounding the LEFT shoulder
joint. This appears to be more in the soft tissues in the bone.

There are bilateral hypermetabolic small axial lymph nodes which
have benign morphology. RIGHT axillary lymph node on image 119 with
SUV max equal 5.6.

No suspicious pulmonary nodules. No hypermetabolic mediastinal lymph
nodes.

Incidental CT findings: none

ABDOMEN/PELVIS: No abnormal hypermetabolic activity within the
liver, pancreas, adrenal glands, or spleen. No hypermetabolic lymph
nodes in the abdomen or pelvis.

No soft tissue plasmacytoma.

Fairly intense metabolic activity associated with small LEFT
external iliac lymph nodes with SUV max equal 7.5. These nodes are
normal size and morphology (image 289)

Incidental CT findings: none

SKELETON: The intense metabolic activity associated with the LEFT
shoulder is favored localized to the soft tissues of the joint
rather than the bone.

No abnormal activity the pelvis or spine.  No abnormal rib activity.

Incidental CT findings: none

EXTREMITIES: Intense activity associated with the LEFT knee above
and below the joint. No clear lesion identified on the CT portion
exam. Metabolic activity associated LEFT RIGHT ankle joint greater
on the RIGHT no lesion on the CT portion exam.

Incidental CT findings: none
IMPRESSION: 1. No convincing evidence of multiple myeloma within the bone.
2. Intense uptake associated with the LEFT shoulder and LEFT knee
are favored inflammatory.
3. Uptake in the ankle joints is favored degenerative in nature.
4. No evidence soft tissue plasmacytoma.
5. Small hypermetabolic bilateral axillary nodes and LEFT inguinal
nodes. Not a pattern typical of metastatic multiple myeloma. That
the nodes have normal morphology and size suggests reactive process.

## 2021-06-07 DIAGNOSIS — H1030 Unspecified acute conjunctivitis, unspecified eye: Secondary | ICD-10-CM | POA: Diagnosis not present

## 2021-06-27 ENCOUNTER — Other Ambulatory Visit: Payer: Self-pay | Admitting: Rheumatology

## 2021-06-27 DIAGNOSIS — M0579 Rheumatoid arthritis with rheumatoid factor of multiple sites without organ or systems involvement: Secondary | ICD-10-CM

## 2021-06-27 MED ORDER — METHOTREXATE SODIUM 2.5 MG PO TABS: ORAL_TABLET | ORAL | 0 refills | Status: DC

## 2021-06-27 NOTE — Telephone Encounter (Signed)
Patient called the office requesting a refill of Methotrexate 2.'5mg'$  to be sent to [facility]. Patient states he is due to take medication on Saturday. ?

## 2021-06-27 NOTE — Telephone Encounter (Signed)
Next Visit: 08/12/2021 ?  ?Last Visit: 03/14/2021 ?  ?Last Fill: 03/14/2021 ? ?TY:YPEJYLTEIH arthritis with rheumatoid factor of multiple sites without organ or systems involvement  ?  ?Current Dose per office note 03/14/2021: methotrexate 8  tablets by mouth once weekly  ? ?Labs: 04/05/2021 RBC 3.57, Hgb 11.0, Hct 34.2, RDW 16.9, Glucose 120, Total Protein 8.2, Albumin 3.4, Total Bilirubin <0.1 ? ?Okay to refill MTX?  ?

## 2021-07-11 ENCOUNTER — Inpatient Hospital Stay: Payer: Medicare Other | Attending: Oncology

## 2021-07-11 DIAGNOSIS — C9000 Multiple myeloma not having achieved remission: Secondary | ICD-10-CM | POA: Insufficient documentation

## 2021-07-11 DIAGNOSIS — Z87891 Personal history of nicotine dependence: Secondary | ICD-10-CM | POA: Insufficient documentation

## 2021-07-11 DIAGNOSIS — N400 Enlarged prostate without lower urinary tract symptoms: Secondary | ICD-10-CM | POA: Diagnosis not present

## 2021-07-11 DIAGNOSIS — D649 Anemia, unspecified: Secondary | ICD-10-CM | POA: Insufficient documentation

## 2021-07-11 DIAGNOSIS — M069 Rheumatoid arthritis, unspecified: Secondary | ICD-10-CM | POA: Diagnosis not present

## 2021-07-11 DIAGNOSIS — D472 Monoclonal gammopathy: Secondary | ICD-10-CM

## 2021-07-11 DIAGNOSIS — Z79631 Long term (current) use of antimetabolite agent: Secondary | ICD-10-CM | POA: Diagnosis not present

## 2021-07-11 DIAGNOSIS — Z79899 Other long term (current) drug therapy: Secondary | ICD-10-CM | POA: Insufficient documentation

## 2021-07-11 LAB — COMPREHENSIVE METABOLIC PANEL
ALT: 8 U/L (ref 0–44)
AST: 19 U/L (ref 15–41)
Albumin: 3.4 g/dL — ABNORMAL LOW (ref 3.5–5.0)
Alkaline Phosphatase: 73 U/L (ref 38–126)
Anion gap: 5 (ref 5–15)
BUN: 19 mg/dL (ref 8–23)
CO2: 25 mmol/L (ref 22–32)
Calcium: 8.8 mg/dL — ABNORMAL LOW (ref 8.9–10.3)
Chloride: 102 mmol/L (ref 98–111)
Creatinine, Ser: 0.97 mg/dL (ref 0.61–1.24)
GFR, Estimated: >60 mL/min (ref >60)
Glucose, Bld: 107 mg/dL — ABNORMAL HIGH (ref 70–99)
Potassium: 4.1 mmol/L (ref 3.5–5.1)
Sodium: 132 mmol/L — ABNORMAL LOW (ref 135–145)
Total Bilirubin: <0.1 mg/dL — ABNORMAL LOW (ref 0.3–1.2)
Total Protein: 8.1 g/dL (ref 6.5–8.1)

## 2021-07-11 LAB — CBC WITH DIFFERENTIAL/PLATELET
Abs Immature Granulocytes: 0.02 10*3/uL (ref 0.00–0.07)
Basophils Absolute: 0 10*3/uL (ref 0.0–0.1)
Basophils Relative: 0 %
Eosinophils Absolute: 0.2 10*3/uL (ref 0.0–0.5)
Eosinophils Relative: 3 %
HCT: 34.6 % — ABNORMAL LOW (ref 39.0–52.0)
Hemoglobin: 11.1 g/dL — ABNORMAL LOW (ref 13.0–17.0)
Immature Granulocytes: 0 %
Lymphocytes Relative: 30 %
Lymphs Abs: 2.4 10*3/uL (ref 0.7–4.0)
MCH: 30.7 pg (ref 26.0–34.0)
MCHC: 32.1 g/dL (ref 30.0–36.0)
MCV: 95.6 fL (ref 80.0–100.0)
Monocytes Absolute: 0.5 10*3/uL (ref 0.1–1.0)
Monocytes Relative: 6 %
Neutro Abs: 5 10*3/uL (ref 1.7–7.7)
Neutrophils Relative %: 61 %
Platelets: 238 10*3/uL (ref 150–400)
RBC: 3.62 MIL/uL — ABNORMAL LOW (ref 4.22–5.81)
RDW: 16.4 % — ABNORMAL HIGH (ref 11.5–15.5)
WBC: 8.1 10*3/uL (ref 4.0–10.5)
nRBC: 0 % (ref 0.0–0.2)

## 2021-07-12 LAB — KAPPA/LAMBDA LIGHT CHAINS
Kappa free light chain: 24.6 mg/L — ABNORMAL HIGH (ref 3.3–19.4)
Kappa, lambda light chain ratio: 0.33 (ref 0.26–1.65)
Lambda free light chains: 74.3 mg/L — ABNORMAL HIGH (ref 5.7–26.3)

## 2021-07-13 LAB — MULTIPLE MYELOMA PANEL, SERUM
Albumin SerPl Elph-Mcnc: 3.4 g/dL (ref 2.9–4.4)
Albumin/Glob SerPl: 0.8 (ref 0.7–1.7)
Alpha 1: 0.2 g/dL (ref 0.0–0.4)
Alpha2 Glob SerPl Elph-Mcnc: 0.6 g/dL (ref 0.4–1.0)
B-Globulin SerPl Elph-Mcnc: 2.9 g/dL — ABNORMAL HIGH (ref 0.7–1.3)
Gamma Glob SerPl Elph-Mcnc: 0.8 g/dL (ref 0.4–1.8)
Globulin, Total: 4.5 g/dL — ABNORMAL HIGH (ref 2.2–3.9)
IgA: 2404 mg/dL — ABNORMAL HIGH (ref 61–437)
IgG (Immunoglobin G), Serum: 965 mg/dL (ref 603–1613)
IgM (Immunoglobulin M), Srm: 64 mg/dL (ref 20–172)
M Protein SerPl Elph-Mcnc: 2.3 g/dL — ABNORMAL HIGH
Total Protein ELP: 7.9 g/dL (ref 6.0–8.5)

## 2021-07-18 ENCOUNTER — Encounter: Payer: Self-pay | Admitting: Oncology

## 2021-07-18 ENCOUNTER — Telehealth: Payer: Self-pay | Admitting: Emergency Medicine

## 2021-07-18 ENCOUNTER — Inpatient Hospital Stay (HOSPITAL_BASED_OUTPATIENT_CLINIC_OR_DEPARTMENT_OTHER): Payer: Medicare Other | Admitting: Oncology

## 2021-07-18 VITALS — BP 121/72 | HR 87 | Temp 97.2°F | Resp 18 | Ht 68.0 in | Wt 321.0 lb

## 2021-07-18 DIAGNOSIS — D472 Monoclonal gammopathy: Secondary | ICD-10-CM | POA: Diagnosis not present

## 2021-07-18 DIAGNOSIS — Z79899 Other long term (current) drug therapy: Secondary | ICD-10-CM | POA: Diagnosis not present

## 2021-07-18 DIAGNOSIS — M069 Rheumatoid arthritis, unspecified: Secondary | ICD-10-CM

## 2021-07-18 DIAGNOSIS — Z7189 Other specified counseling: Secondary | ICD-10-CM | POA: Diagnosis not present

## 2021-07-18 DIAGNOSIS — C9000 Multiple myeloma not having achieved remission: Secondary | ICD-10-CM | POA: Diagnosis not present

## 2021-07-18 DIAGNOSIS — Z87891 Personal history of nicotine dependence: Secondary | ICD-10-CM | POA: Diagnosis not present

## 2021-07-18 DIAGNOSIS — D649 Anemia, unspecified: Secondary | ICD-10-CM

## 2021-07-18 DIAGNOSIS — Z79631 Long term (current) use of antimetabolite agent: Secondary | ICD-10-CM | POA: Diagnosis not present

## 2021-07-18 NOTE — Progress Notes (Signed)
?Hematology/Oncology follow-up note ? ?[phone] [phone] ? ? ?Patient Care Team: ?Seward Carol, MD as PCP - General (Internal Medicine) ? ?REFERRING PROVIDER: ?Seward Carol, MD ? ?CHIEF COMPLAINTS/REASON FOR VISIT:  ?Follow-up for multiple myeloma ? ?HISTORY OF PRESENTING ILLNESS:  ?Eric Cruz is a 71 y.o. male who was seen in consultation at the request of Seward Carol, MD for evaluation of abnormal SPEP results, anemia. .  ? ?Patient recently had work up done at rheumatologist's office and was diagnosed with rheumatoid arthritis and patient was recommended to start MTX. He has not started yet ?Labs reviewed,  ?03/04/20 SPEP showed abnormal protein band of 1.7g/dl.,   , and IFE showed IgA Lamda monoclonal protein.  ?He has had weight loss last year, no weight loss and some weight gain during the past 6 months.   ?Multiple join pain.  ? 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  27% plasma cell, cytogenetics - normal, and myeloma FISH panel is negative.  Standard risk.  Congo red staining was added and was negative. ?normal kidney function, calcium level, hemoglobin above 10 and no bone lesions. ?albumin is decreased at 3.1, slightly elevated beta microglobulin.  Stage II, anemia with hemoglobin above 10. ?  ?INTERVAL HISTORY ?Eric Cruz is a 71 y.o. male who has above history reviewed by me today presents for follow up visit for management of multiple myeloma ? ?He has chronic RA symptoms.  Currently he is on methotrexate weekly and Humira.   ?Patient reports no new complaints.  Mobility has improved.He walks with a cane now. ? ? ?Review of Systems  ?Constitutional:  Positive for fatigue. Negative for appetite change, chills, fever and unexpected weight change.  ?HENT:   Negative for hearing loss and voice change.   ?Eyes:  Negative for eye problems and icterus.   ?Respiratory:  Negative for chest tightness, cough and shortness of breath.   ?Cardiovascular:  Negative for chest pain and leg swelling.  ?Gastrointestinal:  Negative for abdominal distention and abdominal pain.  ?Endocrine: Negative for hot flashes.  ?Genitourinary:  Negative for difficulty urinating, dysuria and frequency.   ?Musculoskeletal:  Positive for arthralgias and back pain.  ?Skin:  Negative for itching and rash.  ?Neurological:  Negative for light-headedness and numbness.  ?Hematological:  Negative for adenopathy. Does not bruise/bleed easily.  ?Psychiatric/Behavioral:  Negative for confusion.   ? ? ?MEDICAL HISTORY:  ?Past Medical History:  ?Diagnosis Date  ? Arthritis   ? BPH (benign prostatic hyperplasia)   ? Cancer [facility])   ? Prostate: states he had a positive biopsy but had another one a year later and it was gone.   ? Depression   ? Dizziness   ? GERD (gastroesophageal reflux disease)   ? Gout   ? High cholesterol   ? Hypertension   ? Sleep apnea   ? has cpap - not currently wearing  ? Varicose veins   ? ? ?SURGICAL HISTORY: ?Past Surgical History:  ?Procedure Laterality Date  ? ANTERIOR CERVICAL DECOMP/DISCECTOMY FUSION N/A 02/21/2018  ? Procedure: Cervical Five-Six Cervical Six-Seven Anterior cervical decompression/discectomy/fusion;  Surgeon: Ashok Pall, MD;  Location: [place];  Service: Neurosurgery;  Laterality: N/A;  Cervical Five-Six Cervical Six-Seven Anterior cervical decompression/discectomy/fusion  ? BIOPSY  10/09/2019  ? Procedure: BIOPSY;  Surgeon: Ronnette Juniper, MD;  Location: [facility];  Service: Gastroenterology;;  ? BLADDER SURGERY    ? CARPAL TUNNEL  RELEASE Right 02/11/2019  ? Procedure: Right Carpal Tunnel Wound Irrigation;  Surgeon: Ashok Pall, MD;  Location: [place];  Service: Neurosurgery;  Laterality: Right;  Right Carpal tunnel wound exploration/wash out  ? CARPAL TUNNEL RELEASE Bilateral   ? CHOLECYSTECTOMY  11/22/2012  ? CHOLECYSTECTOMY  11/22/2012  ? Procedure:  LAPAROSCOPIC CHOLECYSTECTOMY;  Surgeon: Gwenyth Ober, MD;  Location: [place];  Service: General;;  ? COLONOSCOPY    ? COLONOSCOPY WITH PROPOFOL N/A 10/09/2019  ? Procedure: COLONOSCOPY WITH PROPOFOL;  Surgeon: Ronnette Juniper, MD;  Location: [facility];  Service: Gastroenterology;  Laterality: N/A;  ? ESOPHAGOGASTRODUODENOSCOPY (EGD) WITH PROPOFOL N/A 10/09/2019  ? Procedure: ESOPHAGOGASTRODUODENOSCOPY (EGD) WITH PROPOFOL;  Surgeon: Ronnette Juniper, MD;  Location: [facility];  Service: Gastroenterology;  Laterality: N/A;  ? POLYPECTOMY  10/09/2019  ? Procedure: POLYPECTOMY;  Surgeon: Ronnette Juniper, MD;  Location: [facility];  Service: Gastroenterology;;  ? ? ?SOCIAL HISTORY: ?Social History  ? ?Socioeconomic History  ? Marital status: Married  ?  Spouse name: Not on file  ? Number of children: Not on file  ? Years of education: Not on file  ? Highest education level: Not on file  ?Occupational History  ? Not on file  ?Tobacco Use  ? Smoking status: Former  ?  Packs/day: 1.00  ?  Years: 31.00  ?  Pack years: 31.00  ?  Types: Cigarettes  ?  Quit date: 04/12/1998  ?  Years since quitting: 23.2  ? Smokeless tobacco: Never  ?Vaping Use  ? Vaping Use: Never used  ?Substance and Sexual Activity  ? Alcohol use: No  ? Drug use: No  ? Sexual activity: Not on file  ?Other Topics Concern  ? Not on file  ?Social History Narrative  ? Not on file  ? ?Social Determinants of Health  ? ?Financial Resource Strain: Not on file  ?Food Insecurity: Not on file  ?Transportation Needs: Not on file  ?Physical Activity: Not on file  ?Stress: Not on file  ?Social Connections: Not on file  ?Intimate Partner Violence: Not on file  ? ? ?FAMILY HISTORY: ?Family History  ?Problem Relation Age of Onset  ? Hypertension Mother   ? Diabetes Mother   ? Multiple myeloma Mother   ? Hypertension Father   ? Diabetes Father   ? Healthy Son   ? Healthy Daughter   ? ? ?ALLERGIES:  is allergic to atorvastatin, simvastatin, other, and statins. ? ?MEDICATIONS:  ?Current  Outpatient Medications  ?Medication Sig Dispense Refill  ? Adalimumab (HUMIRA PEN) 40 MG/0.4ML PNKT Inject 40 mg into the skin every 14 (fourteen) days. 2 each 2  ? ferrous sulfate 325 (65 FE) MG EC tablet Take 325 mg by mouth daily with breakfast.    ? finasteride (PROSCAR) 5 MG tablet Take 5 mg by mouth daily.    ? folic acid (FOLVITE) 1 MG tablet Take 2 tablets (2 mg total) by mouth daily. 180 tablet 3  ? Gauze Pads & Dressings 2"X2" PADS Applied daily with dressing changes. 21 each 0  ? methotrexate 2.5 MG tablet TAKE 8 TABLETS BY MOUTH ONCE WEEKLY *CAUTION: CHEMOTHERAPY. PROTECTION FROM LIGHT 96 tablet 0  ? tamsulosin (FLOMAX) 0.4 MG CAPS capsule Take 0.4 mg by mouth daily after supper.    ? ascorbic acid (VITAMIN C) 500 MG tablet Take 500 mg by mouth daily. (Patient not taking: Reported on 07/18/2021)    ? calcium-vitamin D (OSCAL WITH D) 500-200 MG-UNIT tablet Take 1 tablet by mouth daily. (Patient  not taking: Reported on 07/18/2021)    ? cyanocobalamin 2000 MCG tablet Take 2,000 mcg by mouth daily. (Patient not taking: Reported on 07/18/2021)    ? Multiple Vitamins-Minerals (MULTIVITAMIN WITH MINERALS) tablet Take 1 tablet by mouth in the morning and at bedtime. (Patient not taking: Reported on 07/18/2021)    ? VITAMIN D PO Take 50 mcg by mouth daily. (Patient not taking: Reported on 07/18/2021)    ? Vitamins A & D (VITAMIN A & D) ointment Apply 1 application topically daily as needed for dry skin. (Patient not taking: Reported on 07/18/2021)    ? ?No current facility-administered medications for this visit.  ? ? ? ?PHYSICAL EXAMINATION: ?ECOG PERFORMANCE STATUS: 1 - Symptomatic but completely ambulatory ?Vitals:  ? 07/18/21 1002  ?BP: 121/72  ?Pulse: 87  ?Resp: 18  ?Temp: (!) 97.2 ?F (36.2 ?C)  ?SpO2: 98%  ? ?Filed Weights  ? 07/18/21 1002  ?Weight: (!) 321 lb (145.6 kg)  ? ? ?Physical Exam ?Constitutional:   ?   General: He is not in acute distress. ?   Appearance: He is obese.  ?   Comments: Patient walks with  a cane.  ?HENT:  ?   Head: Normocephalic and atraumatic.  ?Eyes:  ?   General: No scleral icterus. ?Cardiovascular:  ?   Rate and Rhythm: Normal rate and regular rhythm.  ?   Heart sounds: Normal heart sounds.  ?Pulm

## 2021-07-18 NOTE — Telephone Encounter (Signed)
Pt confirmed availability for bone marrow biopsy on 07/27/21 at 930 with 830 arrival time. ?

## 2021-07-22 NOTE — Progress Notes (Signed)
Left VM 07/22/21 @ 13:20 @ [phone]. Left details regarding pre-procedure instructions to include need to arrive at 8:30 for 9:30 procedure, need to be NPO after midnight on night prior to procedure and need for driver post procedure. Left call back number and encouraged patient to call back if he had any questions. ?

## 2021-07-26 ENCOUNTER — Other Ambulatory Visit: Payer: Self-pay | Admitting: Radiology

## 2021-07-26 ENCOUNTER — Other Ambulatory Visit: Payer: Self-pay | Admitting: Internal Medicine

## 2021-07-26 DIAGNOSIS — Z0000 Encounter for general adult medical examination without abnormal findings: Secondary | ICD-10-CM | POA: Diagnosis not present

## 2021-07-26 DIAGNOSIS — R0989 Other specified symptoms and signs involving the circulatory and respiratory systems: Secondary | ICD-10-CM

## 2021-07-26 DIAGNOSIS — D638 Anemia in other chronic diseases classified elsewhere: Secondary | ICD-10-CM | POA: Diagnosis not present

## 2021-07-26 DIAGNOSIS — E7800 Pure hypercholesterolemia, unspecified: Secondary | ICD-10-CM | POA: Diagnosis not present

## 2021-07-26 DIAGNOSIS — M5000 Cervical disc disorder with myelopathy, unspecified cervical region: Secondary | ICD-10-CM | POA: Diagnosis not present

## 2021-07-26 DIAGNOSIS — D472 Monoclonal gammopathy: Secondary | ICD-10-CM | POA: Diagnosis not present

## 2021-07-26 DIAGNOSIS — M0579 Rheumatoid arthritis with rheumatoid factor of multiple sites without organ or systems involvement: Secondary | ICD-10-CM | POA: Diagnosis not present

## 2021-07-26 DIAGNOSIS — G720 Drug-induced myopathy: Secondary | ICD-10-CM | POA: Diagnosis not present

## 2021-07-27 ENCOUNTER — Other Ambulatory Visit: Payer: Self-pay

## 2021-07-27 ENCOUNTER — Ambulatory Visit
Admission: RE | Admit: 2021-07-27 | Discharge: 2021-07-27 | Disposition: A | Discharge status: Home / Self Care | Payer: Medicare Other | Source: Ambulatory Visit | Attending: Oncology | Admitting: Oncology

## 2021-07-27 ENCOUNTER — Other Ambulatory Visit: Payer: Self-pay | Admitting: Oncology

## 2021-07-27 DIAGNOSIS — Q998 Other specified chromosome abnormalities: Secondary | ICD-10-CM | POA: Diagnosis not present

## 2021-07-27 DIAGNOSIS — M069 Rheumatoid arthritis, unspecified: Secondary | ICD-10-CM | POA: Insufficient documentation

## 2021-07-27 DIAGNOSIS — Z808 Family history of malignant neoplasm of other organs or systems: Secondary | ICD-10-CM | POA: Insufficient documentation

## 2021-07-27 DIAGNOSIS — D649 Anemia, unspecified: Secondary | ICD-10-CM | POA: Insufficient documentation

## 2021-07-27 DIAGNOSIS — C9000 Multiple myeloma not having achieved remission: Secondary | ICD-10-CM | POA: Diagnosis not present

## 2021-07-27 DIAGNOSIS — D472 Monoclonal gammopathy: Secondary | ICD-10-CM | POA: Insufficient documentation

## 2021-07-27 LAB — CBC WITH DIFFERENTIAL/PLATELET
Abs Immature Granulocytes: 0.03 10*3/uL (ref 0.00–0.07)
Basophils Absolute: 0 10*3/uL (ref 0.0–0.1)
Basophils Relative: 0 %
Eosinophils Absolute: 0.2 10*3/uL (ref 0.0–0.5)
Eosinophils Relative: 2 %
HCT: 34.5 % — ABNORMAL LOW (ref 39.0–52.0)
Hemoglobin: 11.1 g/dL — ABNORMAL LOW (ref 13.0–17.0)
Immature Granulocytes: 0 %
Lymphocytes Relative: 30 %
Lymphs Abs: 2.8 10*3/uL (ref 0.7–4.0)
MCH: 30.6 pg (ref 26.0–34.0)
MCHC: 32.2 g/dL (ref 30.0–36.0)
MCV: 95 fL (ref 80.0–100.0)
Monocytes Absolute: 0.6 10*3/uL (ref 0.1–1.0)
Monocytes Relative: 7 %
Neutro Abs: 5.6 10*3/uL (ref 1.7–7.7)
Neutrophils Relative %: 61 %
Platelets: 252 10*3/uL (ref 150–400)
RBC: 3.63 MIL/uL — ABNORMAL LOW (ref 4.22–5.81)
RDW: 16.5 % — ABNORMAL HIGH (ref 11.5–15.5)
WBC: 9.2 10*3/uL (ref 4.0–10.5)
nRBC: 0 % (ref 0.0–0.2)

## 2021-07-27 MED ORDER — MIDAZOLAM HCL 2 MG/2ML IJ SOLN
INTRAMUSCULAR | Status: AC | PRN
Start: 2021-07-27 — End: 2021-07-27
  Administered 2021-07-27: 1 mg via INTRAVENOUS
  Administered 2021-07-27: .5 mg via INTRAVENOUS

## 2021-07-27 MED ORDER — MIDAZOLAM HCL 2 MG/2ML IJ SOLN
INTRAMUSCULAR | Status: AC
  Filled 2021-07-27: qty 2

## 2021-07-27 MED ORDER — SODIUM CHLORIDE 0.9 % IV SOLN
INTRAVENOUS | Status: DC
  Administered 2021-07-27: 1000 mL via INTRAVENOUS

## 2021-07-27 MED ORDER — HEPARIN SOD (PORK) LOCK FLUSH 100 UNIT/ML IV SOLN
INTRAVENOUS | Status: AC
  Filled 2021-07-27: qty 5

## 2021-07-27 MED ORDER — FENTANYL CITRATE (PF) 100 MCG/2ML IJ SOLN
INTRAMUSCULAR | Status: AC
  Filled 2021-07-27: qty 2

## 2021-07-27 MED ORDER — FENTANYL CITRATE (PF) 100 MCG/2ML IJ SOLN
INTRAMUSCULAR | Status: AC | PRN
  Administered 2021-07-27: 50 ug via INTRAVENOUS
  Administered 2021-07-27: 25 ug via INTRAVENOUS

## 2021-07-27 NOTE — Progress Notes (Signed)
Patient clinically stable post BMB per Dr Maryelizabeth Kaufmann, tolerated well. Denies complaints at present. Update given to wife at bedside post procedure. Received Versed 1.5 mg along with Fentanyl 75 mcg IV for procedure. Report given to [facility] post procedure/specials. ?

## 2021-07-27 NOTE — H&P (Signed)
? ?Chief Complaint: ?Patient was seen in consultation today for multiple myeloma at the request of Yu,Zhou ? ?Referring Physician(s): ?Yu,Zhou ? ?Supervising Physician: Michaelle Birks ? ?Patient Status: [place] ? ?History of Present Illness: ?Eric Cruz is a 71 y.o. male with PMHx significant for RA, smoldering IgA multiple myeloma s/p bone marrow biopsy in 04/2020 with PET scan and bone survey done with no evidence of bone lesions. The patient follows with Dr. Tasia Catchings and was recently seen in the office on 4/17 and labs revealed slight increase in M protein, request received for repeat image guided bone marrow biopsy for further evaluation.  ? ?The patient has had a H&P performed within the last 30 days, all history, medications, and exam have been reviewed. The patient denies any interval changes since the H&P. ? ? ?Past Medical History:  ?Diagnosis Date  ? Arthritis   ? BPH (benign prostatic hyperplasia)   ? Cancer [facility])   ? Prostate: states he had a positive biopsy but had another one a year later and it was gone.   ? Depression   ? Dizziness   ? GERD (gastroesophageal reflux disease)   ? Gout   ? High cholesterol   ? Hypertension   ? Sleep apnea   ? has cpap - not currently wearing  ? Varicose veins   ? ? ?Past Surgical History:  ?Procedure Laterality Date  ? ANTERIOR CERVICAL DECOMP/DISCECTOMY FUSION N/A 02/21/2018  ? Procedure: Cervical Five-Six Cervical Six-Seven Anterior cervical decompression/discectomy/fusion;  Surgeon: Ashok Pall, MD;  Location: [place];  Service: Neurosurgery;  Laterality: N/A;  Cervical Five-Six Cervical Six-Seven Anterior cervical decompression/discectomy/fusion  ? BIOPSY  10/09/2019  ? Procedure: BIOPSY;  Surgeon: Ronnette Juniper, MD;  Location: [facility];  Service: Gastroenterology;;  ? BLADDER SURGERY    ? CARPAL TUNNEL RELEASE Right 02/11/2019  ? Procedure: Right Carpal Tunnel Wound Irrigation;  Surgeon: Ashok Pall, MD;  Location: [place];  Service: Neurosurgery;   Laterality: Right;  Right Carpal tunnel wound exploration/wash out  ? CARPAL TUNNEL RELEASE Bilateral   ? CHOLECYSTECTOMY  11/22/2012  ? CHOLECYSTECTOMY  11/22/2012  ? Procedure: LAPAROSCOPIC CHOLECYSTECTOMY;  Surgeon: Gwenyth Ober, MD;  Location: [place];  Service: General;;  ? COLONOSCOPY    ? COLONOSCOPY WITH PROPOFOL N/A 10/09/2019  ? Procedure: COLONOSCOPY WITH PROPOFOL;  Surgeon: Ronnette Juniper, MD;  [facility];  Service: Gastroenterology;  Laterality: N/A;  ? ESOPHAGOGASTRODUODENOSCOPY (EGD) WITH PROPOFOL N/A 10/09/2019  ? Procedure: ESOPHAGOGASTRODUODENOSCOPY (EGD) WITH PROPOFOL;  Surgeon: Ronnette Juniper, MD;  Location: [facility];  Service: Gastroenterology;  Laterality: N/A;  ? POLYPECTOMY  10/09/2019  ? Procedure: POLYPECTOMY;  Surgeon: Ronnette Juniper, MD;  Location: [facility];  Service: Gastroenterology;;  ? ? ?Allergies: ?Atorvastatin, Simvastatin, Other, and Statins ? ?Medications: ?Prior to Admission medications   ?Medication Sig Start Date End Date Taking? Authorizing Provider  ?ferrous sulfate 325 (65 FE) MG EC tablet Take 325 mg by mouth daily with breakfast.   Yes [provider]  ?finasteride (PROSCAR) 5 MG tablet Take 5 mg by mouth daily.   Yes [provider]  ?folic acid (FOLVITE) 1 MG tablet Take 2 tablets (2 mg total) by mouth daily. 03/14/21  Yes Deveshwar, Abel Presto, MD  ?methotrexate 2.5 MG tablet TAKE 8 TABLETS BY MOUTH ONCE WEEKLY *CAUTION: CHEMOTHERAPY. PROTECTION FROM LIGHT 06/27/21  Yes Ofilia Neas, PA-C  ?Adalimumab (HUMIRA PEN) 40 MG/0.4ML PNKT Inject 40 mg into the skin every 14 (fourteen) days. 05/10/21   Bo Merino,  MD  ?ascorbic acid (VITAMIN C) 500 MG tablet Take 500 mg by mouth daily. ?Patient not taking: Reported on 07/18/2021    [provider]  ?calcium-vitamin D (OSCAL WITH D) 500-200 MG-UNIT tablet Take 1 tablet by mouth daily. ?Patient not taking: Reported on 07/18/2021    [provider]  ?cyanocobalamin 2000 MCG tablet Take 2,000  mcg by mouth daily. ?Patient not taking: Reported on 07/18/2021    [provider]  ?Gauze Pads & Dressings 2"X2" PADS Applied daily with dressing changes. 10/29/20   Raspet, Derry Skill, PA-C  ?Multiple Vitamins-Minerals (MULTIVITAMIN WITH MINERALS) tablet Take 1 tablet by mouth in the morning and at bedtime. ?Patient not taking: Reported on 07/18/2021    [provider]  ?tamsulosin (FLOMAX) 0.4 MG CAPS capsule Take 0.4 mg by mouth daily after supper.    [provider]  ?VITAMIN D PO Take 50 mcg by mouth daily. ?Patient not taking: Reported on 07/18/2021    [provider]  ?Vitamins A & D (VITAMIN A & D) ointment Apply 1 application topically daily as needed for dry skin. ?Patient not taking: Reported on 07/18/2021    [provider]  ?  ? ?Family History  ?Problem Relation Age of Onset  ? Hypertension Mother   ? Diabetes Mother   ? Multiple myeloma Mother   ? Hypertension Father   ? Diabetes Father   ? Healthy Son   ? Healthy Daughter   ? ? ?Social History  ? ?Socioeconomic History  ? Marital status: Married  ?  Spouse name: Not on file  ? Number of children: Not on file  ? Years of education: Not on file  ? Highest education level: Not on file  ?Occupational History  ? Not on file  ?Tobacco Use  ? Smoking status: Former  ?  Packs/day: 1.00  ?  Years: 31.00  ?  Pack years: 31.00  ?  Types: Cigarettes  ?  Quit date: 04/12/1998  ?  Years since quitting: 23.3  ? Smokeless tobacco: Never  ?Vaping Use  ? Vaping Use: Never used  ?Substance and Sexual Activity  ? Alcohol use: No  ? Drug use: No  ? Sexual activity: Not on file  ?Other Topics Concern  ? Not on file  ?Social History Narrative  ? Not on file  ? ?Social Determinants of Health  ? ?Financial Resource Strain: Not on file  ?Food Insecurity: Not on file  ?Transportation Needs: Not on file  ?Physical Activity: Not on file  ?Stress: Not on file  ?Social Connections: Not on file  ? ?Review of Systems: A 12 point ROS discussed and  pertinent positives are indicated in the HPI above.  All other systems are negative. ? ?Review of Systems ? ?Vital Signs: ?BP (!) 142/85   Pulse 73   Temp 98.1 ?F (36.7 ?C) (Oral)   Resp 16   Ht '5\' 8"'  (1.727 m)   Wt (!) 321 lb (145.6 kg)   SpO2 96%   BMI 48.81 kg/m?  ? ?Physical Exam ?Constitutional:   ?   Appearance: Normal appearance.  ?Cardiovascular:  ?   Rate and Rhythm: Normal rate and regular rhythm.  ?Pulmonary:  ?   Effort: Pulmonary effort is normal. No respiratory distress.  ?Skin: ?   General: Skin is warm and dry.  ?Neurological:  ?   Mental Status: He is alert and oriented to person, place, and time.  ? ? ?Imaging: ?No results found. ? ?Labs: ? ?CBC: ?  Recent Labs  ?  03/14/21 ?1218 04/05/21 ?1008 07/11/21 ?8546 07/27/21 ?2703  ?WBC 8.9 8.2 8.1 9.2  ?HGB 11.7* 11.0* 11.1* 11.1*  ?HCT 35.3* 34.2* 34.6* 34.5*  ?PLT 261 224 238 252  ? ? ?COAGS: ?No results for input(s): INR, APTT in the last 8760 hours. ? ?BMP: ?Recent Labs  ?  08/18/20 ?1056 12/27/20 ?1222 03/14/21 ?1218 04/05/21 ?1008 07/11/21 ?0949  ?NA 135 134* 138 135 132*  ?K 4.3 4.1 4.5 4.0 4.1  ?CL 103 102 102 102 102  ?CO2 '24 25 28 24 25  ' ?GLUCOSE 94 98 82 120* 107*  ?BUN '13 14 14 13 19  ' ?CALCIUM 9.2 9.0 9.3 8.9 8.8*  ?CREATININE 0.92 0.91 1.07 0.97 0.97  ?GFRNONAA >60 >60  --  >60 >60  ? ? ?LIVER FUNCTION TESTS: ?Recent Labs  ?  08/18/20 ?1056 12/27/20 ?1222 03/14/21 ?1218 04/05/21 ?1008 07/11/21 ?0949  ?BILITOT 0.6 0.4 0.3 <0.1* <0.1*  ?AST '17 18 16 26 19  ' ?ALT 8 7 7* 14 8  ?ALKPHOS 80 76  --  77 73  ?PROT 8.7* 8.5* 8.2* 8.2* 8.1  ?ALBUMIN 3.6 3.5  --  3.4* 3.4*  ? ? ? ?Assessment and Plan: ?This is a 71 year old male with PMHx significant for RA, smoldering IgA multiple myeloma s/p bone marrow biopsy in 04/2020 with PET and bone survey with no evidence of bone lesions. There has been a slight increase in the M protein with lab work. The patient follows with Dr. Tasia Catchings and recently seen in the office on 4/17 with request received for repeat  image guided bone marrow biopsy for further evaluation.  ? ?The patient has been NPO, no medical changes since last H&P on 4/17, labs and vitals have been reviewed. ? ?Risks and benefits of image guided bone marrow bi

## 2021-07-27 NOTE — Procedures (Signed)
Vascular and Interventional Radiology Procedure Note ? ?Patient: Eric Cruz ?DOB: 1951-02-08 ?Medical Record Number: [id number] ?Note Date/Time: 07/27/21 9:53 AM  ? ?Performing Physician: Michaelle Birks, MD ?Assistant(s): None ? ?Diagnosis: MM ? ?Procedure: BONE MARROW ASPIRATION and BIOPSY ? ?Anesthesia: Conscious Sedation ?Complications: None ?Estimated Blood Loss: Minimal ?Specimens: Sent for Pathology ? ?Findings:  ?Successful CT-guided bone marrow biopsy ?A total of 2 cores were obtained. ?Hemostasis of the tract was achieved using Manual Pressure. ? ?Plan: Bed rest for 1 hours. ? ?See detailed procedure note with images in PACS. ?The patient tolerated the procedure well without incident or complication and was returned to [facility] in stable condition.  ? ? ?Michaelle Birks, MD ?[facility] ?[facility] ? ? ?Pager. [phone] ?Clinic. [phone]  ?

## 2021-07-29 NOTE — Progress Notes (Deleted)
Office Visit Note  Patient: Eric Cruz             Date of Birth: 1951/01/18           MRN: [id number]             PCP: Seward Carol, MD Referring: Seward Carol, MD Visit Date: 08/12/2021 Occupation: _0 @  Subjective:  No chief complaint on file.   History of Present Illness: Eric Cruz is a 71 y.o. male ***   Activities of Daily Living:  Patient reports morning stiffness for *** {minute/hour:19697}.   Patient {ACTIONS;DENIES/REPORTS:21021675::"Denies"} nocturnal pain.  Difficulty dressing/grooming: {ACTIONS;DENIES/REPORTS:21021675::"Denies"} Difficulty climbing stairs: {ACTIONS;DENIES/REPORTS:21021675::"Denies"} Difficulty getting out of chair: {ACTIONS;DENIES/REPORTS:21021675::"Denies"} Difficulty using hands for taps, buttons, cutlery, and/or writing: {ACTIONS;DENIES/REPORTS:21021675::"Denies"}  No Rheumatology ROS completed.   PMFS History:  Patient Active Problem List   Diagnosis Date Noted   Chest pain 08/25/2020   Gastroesophageal reflux disease 08/25/2020   Goals of care, counseling/discussion 04/22/2020   Multiple myeloma not having achieved remission ([place]) 04/22/2020   Rheumatoid arthritis ([place]) 04/07/2020   High risk medication use 04/07/2020   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   Synovitis of left knee 09/16/2019   Wound infection after surgery 02/11/2019   HNP (herniated nucleus pulposus) with myelopathy, cervical 02/21/2018   Postop check 12/10/2012   Symptomatic cholelithiasis 11/15/2012    Past Medical History:  Diagnosis Date   Arthritis    BPH (benign prostatic hyperplasia)    Cancer ([place])    Prostate: states he had a positive biopsy but had another one a year later and it was gone.    Depression    Dizziness    GERD (gastroesophageal reflux disease)    Gout    High cholesterol    Hypertension    Sleep apnea    has cpap - not currently wearing   Varicose veins     Family History  Problem Relation Age of Onset    Hypertension Mother    Diabetes Mother    Multiple myeloma Mother    Hypertension Father    Diabetes Father    Healthy Son    Healthy Daughter    Past Surgical History:  Procedure Laterality Date   ANTERIOR CERVICAL DECOMP/DISCECTOMY FUSION N/A 02/21/2018   Procedure: Cervical Five-Six Cervical Six-Seven Anterior cervical decompression/discectomy/fusion;  Surgeon: Ashok Pall, MD;  Location: [facility];  Service: Neurosurgery;  Laterality: N/A;  Cervical Five-Six Cervical Six-Seven Anterior cervical decompression/discectomy/fusion   BIOPSY  10/09/2019   Procedure: BIOPSY;  Surgeon: Ronnette Juniper, MD;  Location: [facility];  Service: Gastroenterology;;   BLADDER SURGERY     CARPAL TUNNEL RELEASE Right 02/11/2019   Procedure: Right Carpal Tunnel Wound Irrigation;  Surgeon: Ashok Pall, MD;  Location: [facility];  Service: Neurosurgery;  Laterality: Right;  Right Carpal tunnel wound exploration/wash out   CARPAL TUNNEL RELEASE Bilateral    CHOLECYSTECTOMY  11/22/2012   CHOLECYSTECTOMY  11/22/2012   Procedure: LAPAROSCOPIC CHOLECYSTECTOMY;  Surgeon: Gwenyth Ober, MD;  Location: [facility];  Service: General;;   COLONOSCOPY     COLONOSCOPY WITH PROPOFOL N/A 10/09/2019   Procedure: COLONOSCOPY WITH PROPOFOL;  Surgeon: Ronnette Juniper, MD;  [facility];  Service: Gastroenterology;  Laterality: N/A;   ESOPHAGOGASTRODUODENOSCOPY (EGD) WITH PROPOFOL N/A 10/09/2019   Procedure: ESOPHAGOGASTRODUODENOSCOPY (EGD) WITH PROPOFOL;  Surgeon: Ronnette Juniper, MD;  [facility];  Service: Gastroenterology;  Laterality: N/A;   POLYPECTOMY  10/09/2019   Procedure: POLYPECTOMY;  Surgeon: Ronnette Juniper, MD;  [facility];  Service: Gastroenterology;;   Social History   Social History Narrative   Not on file   Immunization History  Administered Date(s) Administered   PFIZER(Purple Top)SARS-COV-2 Vaccination 03/04/2020, 04/05/2020, 09/14/2020     Objective: Vital Signs: There were no vitals taken for  this visit.   Physical Exam   Musculoskeletal Exam: ***  CDAI Exam: CDAI Score: -- Patient Global: --; Provider Global: -- Swollen: --; Tender: -- Joint Exam 08/12/2021   No joint exam has been documented for this visit   There is currently no information documented on the homunculus. Go to the Rheumatology activity and complete the homunculus joint exam.  Investigation: No additional findings.  Imaging: CT BONE MARROW BIOPSY & ASPIRATION  Result Date: 07/27/2021 INDICATION: Multiple myeloma EXAM: CT GUIDED BONE MARROW ASPIRATION AND CORE BIOPSY RADIATION DOSE REDUCTION: This exam was performed according to the departmental dose-optimization program which includes automated exposure control, adjustment of the mA and/or kV according to patient size and/or use of iterative reconstruction technique. MEDICATIONS: None. ANESTHESIA/SEDATION: Moderate (conscious) sedation was employed during this procedure. A total of 1.5 milligrams versed and 75 micrograms fentanyl were administered intravenously. The patient's level of consciousness and vital signs were monitored continuously by radiology nursing throughout the procedure under my direct supervision. Total monitored sedation time: 12 minutes FLUOROSCOPY TIME:  CT dose in mGy was not provided. COMPLICATIONS: None immediate. Estimated blood loss: <5 mL PROCEDURE: Informed written consent was obtained from the patient after a thorough discussion of the procedural risks, benefits and alternatives. All questions were addressed. Maximal Sterile Barrier Technique was utilized including caps, mask, sterile gowns, sterile gloves, sterile drape, hand hygiene and skin antiseptic. A timeout was performed prior to the initiation of the procedure. The patient was positioned prone and non-contrast localization CT was performed of the pelvis to demonstrate the iliac marrow spaces. Maximal barrier sterile technique utilized including caps, mask, sterile gowns,  sterile gloves, large sterile drape, hand hygiene, and chlorhexidine prep. Under sterile conditions and local anesthesia, an 11 gauge coaxial bone biopsy needle was advanced into the RIGHT iliac marrow space. Needle position was confirmed with CT imaging. Initially, bone marrow aspiration was performed. Next, the 11 gauge outer cannula was utilized to obtain a 2 iliac bone marrow core biopsy. Needle was removed. Hemostasis was obtained with compression. The patient tolerated the procedure well. Samples were prepared with the cytotechnologist. IMPRESSION: Successful CT-guided bone marrow aspiration and biopsy, as above. Michaelle Birks, MD [facility] Radiology Electronically Signed   By: Michaelle Birks M.D.   On: 07/27/2021 11:51    Recent Labs: Lab Results  Component Value Date   WBC 9.2 07/27/2021   HGB 11.1 (L) 07/27/2021   PLT 252 07/27/2021   NA 132 (L) 07/11/2021   K 4.1 07/11/2021   CL 102 07/11/2021   CO2 25 07/11/2021   GLUCOSE 107 (H) 07/11/2021   BUN 19 07/11/2021   CREATININE 0.97 07/11/2021   BILITOT <0.1 (L) 07/11/2021   ALKPHOS 73 07/11/2021   AST 19 07/11/2021   ALT 8 07/11/2021   PROT 8.1 07/11/2021   ALBUMIN 3.4 (L) 07/11/2021   CALCIUM 8.8 (L) 07/11/2021   GFRAA 104 06/07/2020   QFTBGOLDPLUS NEGATIVE 03/14/2021    Speciality Comments: No specialty comments available.  Procedures:  No procedures performed Allergies: Atorvastatin, Simvastatin, Other, and Statins   Assessment / Plan:     Visit Diagnoses: Rheumatoid arthritis with rheumatoid factor of multiple sites without organ or systems involvement ([place])  High risk medication use  Arthritis of both acromioclavicular joints  Contracture of right elbow  HNP (herniated nucleus pulposus) with myelopathy, cervical  History of bilateral carpal tunnel release  Multiple myeloma not having achieved remission ([place])  Symptomatic cholelithiasis  Orders: No orders of  the defined types were placed in this encounter.  No orders of the defined types were placed in this encounter.   Face-to-face time spent with patient was *** minutes. Greater than 50% of time was spent in counseling and coordination of care.  Follow-Up Instructions: No follow-ups on file.   Ofilia Neas, PA-C  Note - This record has been created using Dragon software.  Chart creation errors have been sought, but may not always  have been located. Such creation errors do not reflect on  the standard of medical care.

## 2021-08-01 ENCOUNTER — Other Ambulatory Visit: Payer: Medicare Other

## 2021-08-02 ENCOUNTER — Other Ambulatory Visit: Payer: Self-pay | Admitting: Internal Medicine

## 2021-08-02 ENCOUNTER — Ambulatory Visit
Admission: RE | Admit: 2021-08-02 | Discharge: 2021-08-02 | Disposition: A | Discharge status: Home / Self Care | Payer: Medicare Other | Source: Ambulatory Visit | Attending: Internal Medicine | Admitting: Internal Medicine

## 2021-08-02 DIAGNOSIS — R0989 Other specified symptoms and signs involving the circulatory and respiratory systems: Secondary | ICD-10-CM

## 2021-08-02 DIAGNOSIS — S069X0A Unspecified intracranial injury without loss of consciousness, initial encounter: Secondary | ICD-10-CM | POA: Diagnosis not present

## 2021-08-05 ENCOUNTER — Encounter (HOSPITAL_COMMUNITY): Payer: Self-pay | Admitting: Oncology

## 2021-08-08 ENCOUNTER — Encounter (HOSPITAL_COMMUNITY): Payer: Self-pay

## 2021-08-08 ENCOUNTER — Telehealth: Payer: Self-pay

## 2021-08-08 DIAGNOSIS — R3915 Urgency of urination: Secondary | ICD-10-CM | POA: Diagnosis not present

## 2021-08-08 NOTE — Telephone Encounter (Signed)
Please schedule patient for MD only follow up to discuss biopsy results. This week or next. Pt prefers tues or wed appts. Please inform pt of appt details  ?

## 2021-08-08 NOTE — Telephone Encounter (Signed)
-----   Message from Earlie Server, MD sent at 08/07/2021  9:37 PM EDT ----- ?Please schedule him to do a MD follow up visit. Thanks.  ?

## 2021-08-09 LAB — SURGICAL PATHOLOGY

## 2021-08-10 ENCOUNTER — Inpatient Hospital Stay: Payer: Medicare Other | Attending: Oncology | Admitting: Oncology

## 2021-08-10 ENCOUNTER — Encounter: Payer: Self-pay | Admitting: Oncology

## 2021-08-10 VITALS — BP 137/85 | HR 80 | Temp 97.5°F | Resp 18

## 2021-08-10 DIAGNOSIS — Z5112 Encounter for antineoplastic immunotherapy: Secondary | ICD-10-CM | POA: Insufficient documentation

## 2021-08-10 DIAGNOSIS — D649 Anemia, unspecified: Secondary | ICD-10-CM | POA: Diagnosis not present

## 2021-08-10 DIAGNOSIS — C9000 Multiple myeloma not having achieved remission: Secondary | ICD-10-CM | POA: Diagnosis not present

## 2021-08-10 DIAGNOSIS — Z7189 Other specified counseling: Secondary | ICD-10-CM

## 2021-08-10 DIAGNOSIS — Z79899 Other long term (current) drug therapy: Secondary | ICD-10-CM | POA: Diagnosis not present

## 2021-08-10 DIAGNOSIS — M069 Rheumatoid arthritis, unspecified: Secondary | ICD-10-CM | POA: Diagnosis not present

## 2021-08-10 DIAGNOSIS — Z87891 Personal history of nicotine dependence: Secondary | ICD-10-CM | POA: Insufficient documentation

## 2021-08-10 MED ORDER — ASPIRIN EC 81 MG PO TBEC: 81.0000 mg | DELAYED_RELEASE_TABLET | Freq: Every day | ORAL | 3 refills | Status: DC

## 2021-08-10 MED ORDER — ACYCLOVIR 400 MG PO TABS: 400.0000 mg | ORAL_TABLET | Freq: Two times a day (BID) | ORAL | 5 refills | Status: DC

## 2021-08-10 MED ORDER — ONDANSETRON HCL 8 MG PO TABS: 8.0000 mg | ORAL_TABLET | Freq: Two times a day (BID) | ORAL | 1 refills | Status: DC | PRN

## 2021-08-10 MED ORDER — LENALIDOMIDE 25 MG PO CAPS: 25.0000 mg | ORAL_CAPSULE | Freq: Every day | ORAL | 0 refills | Status: DC

## 2021-08-10 MED ORDER — PROCHLORPERAZINE MALEATE 10 MG PO TABS: 10.0000 mg | ORAL_TABLET | Freq: Four times a day (QID) | ORAL | 1 refills | Status: DC | PRN

## 2021-08-10 MED ORDER — MONTELUKAST SODIUM 10 MG PO TABS: 10.0000 mg | ORAL_TABLET | ORAL | 0 refills | Status: DC

## 2021-08-10 NOTE — Progress Notes (Signed)
START ON PATHWAY REGIMEN - Multiple Myeloma and Other Plasma Cell Dyscrasias ? ? ?DaraVRd (Daratumumab SUBQ + Bortezomib SUBQ + Lenalidomide PO + Dexamethasone IV/PO) q21 Days (Induction Schema): ?  A cycle is every 21 days: ?    Lenalidomide  ?    Dexamethasone  ?    Bortezomib  ?    Daratumumab and hyaluronidase-fihj  ? ? ?DaraVRd (Daratumumab SUBQ + Bortezomib SUBQ + Lenalidomide PO + Dexamethasone IV/PO) q21 Days (Consolidation Schema): ?  A cycle is every 21 days: ?    Lenalidomide  ?    Dexamethasone  ?    Bortezomib  ?    Daratumumab and hyaluronidase-fihj  ? ?**Always confirm dose/schedule in your pharmacy ordering system** ? ?Patient Characteristics: ?Multiple Myeloma, Newly Diagnosed, Transplant Eligible, High Risk ?Disease Classification: Multiple Myeloma ?R-ISS Staging: II ?Therapeutic Status: Newly Diagnosed ?Is Patient Eligible for Transplant<= Transplant Eligible ?Risk Status: High Risk ?Intent of Therapy: ?Non-Curative / Palliative Intent, Discussed with Patient ?

## 2021-08-10 NOTE — Progress Notes (Signed)
Patient on plan of care prior to pathways. 

## 2021-08-10 NOTE — Addendum Note (Signed)
Addended by: Earlie Server on: 08/10/2021 11:24 PM ? ? Modules accepted: Orders ? ?

## 2021-08-10 NOTE — Progress Notes (Signed)
?Hematology/Oncology follow-up note ? ?[phone] [phone] ? ? ?Patient Care Team: ?Seward Carol, MD as PCP - General (Internal Medicine) ? ?REFERRING PROVIDER: ?Seward Carol, MD ? ?CHIEF COMPLAINTS/REASON FOR VISIT:  ?Follow-up for multiple myeloma ? ?HISTORY OF PRESENTING ILLNESS:  ?Eric Cruz is a 71 y.o. male who was seen in consultation at the request of Seward Carol, MD for evaluation of abnormal SPEP results, anemia. .  ? ?Patient recently had work up done at rheumatologist's office and was diagnosed with rheumatoid arthritis and patient was recommended to start MTX. He has not started yet ?Labs reviewed,  ?03/04/20 SPEP showed abnormal protein band of 1.7g/dl.,   , and IFE showed IgA Lamda monoclonal protein.  ?He has had weight loss last year, no weight loss and some weight gain during the past 6 months.   ?Multiple join pain.  ? 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  27% plasma cell, cytogenetics - normal, and myeloma FISH panel is negative.  Standard risk.  [place] red staining was added and was negative. ?normal kidney function, calcium level, hemoglobin above 10 and no bone lesions. ?albumin is decreased at 3.1, slightly elevated beta microglobulin.  Stage II, anemia with hemoglobin above 10. ?  ?INTERVAL HISTORY ?Eric Cruz is a 71 y.o. male who has above history reviewed by me today presents for follow up visit for management of multiple myeloma ? ?He has chronic RA symptoms.  Currently he is on methotrexate weekly and Humira.   ?07/27/2021, bone marrow biopsy showed hypercellular marrow involved by plasma cell myeloma, CD138 immunohistochemistry plasma cells compromise approximately 70% of the cellular elements and are lambda restricted by light chain in situ hybridization.  Cytogenetics showed duplication of 1q.  Cytogenetics is normal. ? ?Patient  presented to discuss bone marrow biopsy results and management plan.  Patient feels pretty well. ? ?Review of Systems  ?Constitutional:  Positive for fatigue. Negative for appetite change, chills, fever and unexpected weight change.  ?HENT:   Negative for hearing loss and voice change.   ?Eyes:  Negative for eye problems and icterus.  ?Respiratory:  Negative for chest tightness, cough and shortness of breath.   ?Cardiovascular:  Negative for chest pain and leg swelling.  ?Gastrointestinal:  Negative for abdominal distention and abdominal pain.  ?Endocrine: Negative for hot flashes.  ?Genitourinary:  Negative for difficulty urinating, dysuria and frequency.   ?Musculoskeletal:  Positive for arthralgias and back pain.  ?Skin:  Negative for itching and rash.  ?Neurological:  Negative for light-headedness and numbness.  ?Hematological:  Negative for adenopathy. Does not bruise/bleed easily.  ?Psychiatric/Behavioral:  Negative for confusion.   ? ? ?MEDICAL HISTORY:  ?Past Medical History:  ?Diagnosis Date  ? Arthritis   ? BPH (benign prostatic hyperplasia)   ? Cancer [facility])   ? Prostate: states he had a positive biopsy but had another one a year later and it was gone.   ? Depression   ? Dizziness   ? GERD (gastroesophageal reflux disease)   ? Gout   ? High cholesterol   ? Hypertension   ? Sleep apnea   ? has cpap - not currently wearing  ? Varicose veins   ? ? ?SURGICAL HISTORY: ?Past Surgical History:  ?Procedure Laterality Date  ? ANTERIOR CERVICAL DECOMP/DISCECTOMY FUSION N/A 02/21/2018  ? Procedure: Cervical Five-Six Cervical Six-Seven Anterior cervical decompression/discectomy/fusion;  Surgeon: Ashok Pall, MD;  Location: [facility];  Service: Neurosurgery;  Laterality: N/A;  Cervical Five-Six Cervical Six-Seven Anterior cervical decompression/discectomy/fusion  ? BIOPSY  10/09/2019  ? Procedure: BIOPSY;  Surgeon: Ronnette Juniper, MD;  Location: [facility];  Service: Gastroenterology;;  ? BLADDER SURGERY    ? CARPAL TUNNEL  RELEASE Right 02/11/2019  ? Procedure: Right Carpal Tunnel Wound Irrigation;  Surgeon: Ashok Pall, MD;  Location: [place];  Service: Neurosurgery;  Laterality: Right;  Right Carpal tunnel wound exploration/wash out  ? CARPAL TUNNEL RELEASE Bilateral   ? CHOLECYSTECTOMY  11/22/2012  ? CHOLECYSTECTOMY  11/22/2012  ? Procedure: LAPAROSCOPIC CHOLECYSTECTOMY;  Surgeon: Gwenyth Ober, MD;  Location: [facility];  Service: General;;  ? COLONOSCOPY    ? COLONOSCOPY WITH PROPOFOL N/A 10/09/2019  ? Procedure: COLONOSCOPY WITH PROPOFOL;  Surgeon: Ronnette Juniper, MD;  Location: [facility];  Service: Gastroenterology;  Laterality: N/A;  ? ESOPHAGOGASTRODUODENOSCOPY (EGD) WITH PROPOFOL N/A 10/09/2019  ? Procedure: ESOPHAGOGASTRODUODENOSCOPY (EGD) WITH PROPOFOL;  Surgeon: Ronnette Juniper, MD;  Location: [facility];  Service: Gastroenterology;  Laterality: N/A;  ? POLYPECTOMY  10/09/2019  ? Procedure: POLYPECTOMY;  Surgeon: Ronnette Juniper, MD;  Location: [facility];  Service: Gastroenterology;;  ? ? ?SOCIAL HISTORY: ?Social History  ? ?Socioeconomic History  ? Marital status: Married  ?  Spouse name: Not on file  ? Number of children: Not on file  ? Years of education: Not on file  ? Highest education level: Not on file  ?Occupational History  ? Not on file  ?Tobacco Use  ? Smoking status: Former  ?  Packs/day: 1.00  ?  Years: 31.00  ?  Pack years: 31.00  ?  Types: Cigarettes  ?  Quit date: 04/12/1998  ?  Years since quitting: 23.3  ? Smokeless tobacco: Never  ?Vaping Use  ? Vaping Use: Never used  ?Substance and Sexual Activity  ? Alcohol use: No  ? Drug use: No  ? Sexual activity: Not on file  ?Other Topics Concern  ? Not on file  ?Social History Narrative  ? Not on file  ? ?Social Determinants of Health  ? ?Financial Resource Strain: Not on file  ?Food Insecurity: Not on file  ?Transportation Needs: Not on file  ?Physical Activity: Not on file  ?Stress: Not on file  ?Social Connections: Not on file  ?Intimate Partner Violence: Not on file   ? ? ?FAMILY HISTORY: ?Family History  ?Problem Relation Age of Onset  ? Hypertension Mother   ? Diabetes Mother   ? Multiple myeloma Mother   ? Hypertension Father   ? Diabetes Father   ? Healthy Son   ? Healthy Daughter   ? ? ?ALLERGIES:  is allergic to atorvastatin, simvastatin, other, and statins. ? ?MEDICATIONS:  ?Current Outpatient Medications  ?Medication Sig Dispense Refill  ? Adalimumab (HUMIRA PEN) 40 MG/0.4ML PNKT Inject 40 mg into the skin every 14 (fourteen) days. 2 each 2  ? ferrous sulfate 325 (65 FE) MG EC tablet Take 325 mg by mouth daily with breakfast.    ? finasteride (PROSCAR) 5 MG tablet Take 5 mg by mouth daily.    ? folic acid (FOLVITE) 1 MG tablet Take 2 tablets (2 mg total) by mouth daily. 180 tablet 3  ? Gauze Pads & Dressings 2"X2" PADS Applied daily with dressing changes. 21 each 0  ? methotrexate 2.5 MG tablet TAKE 8 TABLETS BY MOUTH ONCE WEEKLY *CAUTION: CHEMOTHERAPY. PROTECTION FROM LIGHT 96 tablet 0  ? tamsulosin (FLOMAX) 0.4 MG CAPS capsule Take 0.4 mg by mouth daily  after supper.    ? ?No current facility-administered medications for this visit.  ? ? ? ?PHYSICAL EXAMINATION: ?ECOG PERFORMANCE STATUS: 1 - Symptomatic but completely ambulatory ?Vitals:  ? 08/10/21 1331  ?BP: 137/85  ?Pulse: 80  ?Resp: 18  ?Temp: (!) 97.5 ?F (36.4 ?C)  ? ?Filed Weights  ? ? ?Physical Exam ?Constitutional:   ?   General: He is not in acute distress. ?   Appearance: He is obese.  ?   Comments: Patient walks with a cane.  ?HENT:  ?   Head: Normocephalic and atraumatic.  ?Eyes:  ?   General: No scleral icterus. ?Cardiovascular:  ?   Rate and Rhythm: Normal rate and regular rhythm.  ?   Heart sounds: Normal heart sounds.  ?Pulmonary:  ?   Effort: Pulmonary effort is normal. No respiratory distress.  ?   Breath sounds: No wheezing.  ?Abdominal:  ?   General: Bowel sounds are normal. There is no distension.  ?   Palpations: Abdomen is soft.  ?Musculoskeletal:     ?   General: No deformity. Normal range of  motion.  ?   Cervical back: Normal range of motion and neck supple.  ?   Comments: Trace edema, bilateral low extremities.   ?Skin: ?   General: Skin is warm and dry.  ?   Findings: No erythema or rash.  ?Neurol

## 2021-08-11 ENCOUNTER — Other Ambulatory Visit: Payer: Self-pay | Admitting: Pharmacist

## 2021-08-11 ENCOUNTER — Telehealth: Payer: Self-pay | Admitting: Pharmacy Technician

## 2021-08-11 ENCOUNTER — Telehealth: Payer: Self-pay

## 2021-08-11 ENCOUNTER — Encounter: Payer: Self-pay | Admitting: Oncology

## 2021-08-11 ENCOUNTER — Other Ambulatory Visit (HOSPITAL_COMMUNITY): Payer: Self-pay

## 2021-08-11 DIAGNOSIS — C9000 Multiple myeloma not having achieved remission: Secondary | ICD-10-CM

## 2021-08-11 MED ORDER — LENALIDOMIDE 25 MG PO CAPS: 25.0000 mg | ORAL_CAPSULE | Freq: Every day | ORAL | 0 refills | Status: DC

## 2021-08-11 NOTE — Telephone Encounter (Signed)
Patient has been enrolled in [place] program via online site.  ? ?Revlimid 25 mg , 14 capsules  ?auth # [id number] ?

## 2021-08-11 NOTE — Progress Notes (Signed)
Oral Oncology Pharmacist Encounter ? ?Revlimid (lenalidomide) prescription has been redirected to [place] for dispensing due to medication being limited distribution.  ? ?Leron Croak, PharmD, BCPS ?Hematology/Oncology Clinical Pharmacist ?[facility] ?[phone] ?08/11/2021 10:06 AM ? ? ? ?

## 2021-08-11 NOTE — Progress Notes (Signed)
? ?Office Visit Note ? ?Patient: ANGIE HOGG             ?Date of Birth: [birth year]           ?MRN: [id number]             ?PCP: Seward Carol, MD ?Referring: Seward Carol, MD ?Visit Date: 08/16/2021 ?Occupation: '@GUAROCC' @ ? ?Subjective:  ?Right shoulder pain ? ?History of Present Illness: MACKSON BOTZ is a 71 y.o. male with history of seropositive rheumatoid arthritis, osteoarthritis and degenerative disc disease.  He has been on methotrexate 8 tablets p.o. weekly since January 2022 and Humira 40 mg subcu every other week since April 2022.  His rheumatoid arthritis is well controlled on the combination therapy.  He continues to have some joint pain and stiffness in his shoulder joints due to underlying osteoarthritis.  He denies any history of joint swelling.  He has been using arthritis gloves which has been helpful.  He states that he is a scheduled to start chemotherapy next week for multiple myeloma. ? ?Activities of Daily Living:  ?Patient reports morning stiffness for all day.  ?Patient Reports nocturnal pain.  ?Difficulty dressing/grooming: Denies ?Difficulty climbing stairs: Denies ?Difficulty getting out of chair: Reports ?Difficulty using hands for taps, buttons, cutlery, and/or writing: Reports ? ?Review of Systems  ?Constitutional:  Positive for fatigue.  ?HENT:  Negative for mouth sores, mouth dryness and nose dryness.   ?Eyes:  Negative for pain, itching and dryness.  ?Respiratory:  Negative for shortness of breath and difficulty breathing.   ?Cardiovascular:  Negative for chest pain and palpitations.  ?Gastrointestinal:  Positive for constipation and diarrhea. Negative for blood in stool.  ?Endocrine: Negative for increased urination.  ?Genitourinary:  Positive for involuntary urination. Negative for difficulty urinating.  ?Musculoskeletal:  Positive for joint pain, joint pain, myalgias, morning stiffness and myalgias. Negative for joint swelling and muscle tenderness.  ?Skin:  Negative  for color change, rash and redness.  ?Allergic/Immunologic: Positive for susceptible to infections.  ?Neurological:  Positive for numbness and memory loss. Negative for dizziness and headaches.  ?Hematological:  Negative for bruising/bleeding tendency.  ?Psychiatric/Behavioral:  Negative for confusion.   ? ?PMFS History:  ?Patient Active Problem List  ? Diagnosis Date Noted  ? Chest pain 08/25/2020  ? Gastroesophageal reflux disease 08/25/2020  ? Goals of care, counseling/discussion 04/22/2020  ? Multiple myeloma not having achieved remission ([place]) 04/22/2020  ? Rheumatoid arthritis ([place]) 04/07/2020  ? High risk medication use 04/07/2020  ? 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  ? Synovitis of left knee 09/16/2019  ? Wound infection after surgery 02/11/2019  ? HNP (herniated nucleus pulposus) with myelopathy, cervical 02/21/2018  ? Postop check 12/10/2012  ? Symptomatic cholelithiasis 11/15/2012  ?  ?Past Medical History:  ?Diagnosis Date  ? Arthritis   ? BPH (benign prostatic hyperplasia)   ? Cancer [facility])   ? Prostate: states he had a positive biopsy but had another one a year later and it was gone.   ? Depression   ? Dizziness   ? GERD (gastroesophageal reflux disease)   ? Gout   ? High cholesterol   ? Hypertension   ? Sleep apnea   ? has cpap - not currently wearing  ? Varicose veins   ?  ?Family History  ?Problem Relation Age of Onset  ? Hypertension Mother   ? Diabetes Mother   ? Multiple myeloma Mother   ? Hypertension Father   ? Diabetes Father   ? Healthy  Son   ? Healthy Daughter   ? ?Past Surgical History:  ?Procedure Laterality Date  ? ANTERIOR CERVICAL DECOMP/DISCECTOMY FUSION N/A 02/21/2018  ? Procedure: Cervical Five-Six Cervical Six-Seven Anterior cervical decompression/discectomy/fusion;  Surgeon: Ashok Pall, MD;  Location: [place];  Service: Neurosurgery;  Laterality: N/A;  Cervical Five-Six Cervical Six-Seven Anterior cervical decompression/discectomy/fusion  ? BIOPSY  10/09/2019  ? Procedure:  BIOPSY;  Surgeon: Ronnette Juniper, MD;  Location: [facility];  Service: Gastroenterology;;  ? BLADDER SURGERY    ? CARPAL TUNNEL RELEASE Right 02/11/2019  ? Procedure: Right Carpal Tunnel Wound Irrigation;  Surgeon: Ashok Pall, MD;  Location: [place];  Service: Neurosurgery;  Laterality: Right;  Right Carpal tunnel wound exploration/wash out  ? CARPAL TUNNEL RELEASE Bilateral   ? CHOLECYSTECTOMY  11/22/2012  ? CHOLECYSTECTOMY  11/22/2012  ? Procedure: LAPAROSCOPIC CHOLECYSTECTOMY;  Surgeon: Gwenyth Ober, MD;  Location: [place];  Service: General;;  ? COLONOSCOPY    ? COLONOSCOPY WITH PROPOFOL N/A 10/09/2019  ? Procedure: COLONOSCOPY WITH PROPOFOL;  Surgeon: Ronnette Juniper, MD;  Location: [facility];  Service: Gastroenterology;  Laterality: N/A;  ? ESOPHAGOGASTRODUODENOSCOPY (EGD) WITH PROPOFOL N/A 10/09/2019  ? Procedure: ESOPHAGOGASTRODUODENOSCOPY (EGD) WITH PROPOFOL;  Surgeon: Ronnette Juniper, MD;  Location: [facility];  Service: Gastroenterology;  Laterality: N/A;  ? POLYPECTOMY  10/09/2019  ? Procedure: POLYPECTOMY;  Surgeon: Ronnette Juniper, MD;  Location: [facility];  Service: Gastroenterology;;  ? ?Social History  ? ?Social History Narrative  ? Not on file  ? ?Immunization History  ?Administered Date(s) Administered  ? PFIZER(Purple Top)SARS-COV-2 Vaccination 03/04/2020, 04/05/2020, 09/14/2020  ?  ? ?Objective: ?Vital Signs: BP 124/80 (BP Location: Left Arm, Patient Position: Sitting, Cuff Size: Large)   Pulse 93   Ht '5\' 8"'  (1.727 m)   Wt (!) 322 lb 9.6 oz (146.3 kg)   BMI 49.05 kg/m?   ? ?Physical Exam ?Vitals and nursing note reviewed.  ?Constitutional:   ?   Appearance: He is well-developed.  ?HENT:  ?   Head: Normocephalic and atraumatic.  ?Eyes:  ?   Conjunctiva/sclera: Conjunctivae normal.  ?   Pupils: Pupils are equal, round, and reactive to light.  ?Cardiovascular:  ?   Rate and Rhythm: Normal rate and regular rhythm.  ?   Heart sounds: Normal heart sounds.  ?Pulmonary:  ?   Effort: Pulmonary effort is normal.   ?   Breath sounds: Normal breath sounds.  ?Abdominal:  ?   General: Bowel sounds are normal.  ?   Palpations: Abdomen is soft.  ?Musculoskeletal:  ?   Cervical back: Normal range of motion and neck supple.  ?Skin: ?   General: Skin is warm and dry.  ?   Capillary Refill: Capillary refill takes less than 2 seconds.  ?Neurological:  ?   Mental Status: He is alert and oriented to person, place, and time.  ?Psychiatric:     ?   Behavior: Behavior normal.  ?  ? ?Musculoskeletal Exam: He had limited range of motion of the cervical spine.  He had painful range of motion of bilateral shoulder joints especially with the right shoulder joint.  He had tenderness over acromioclavicular joints.  He had contracture in his right elbow with no synovitis.  Wrist joints MCPs PIPs were in good range of motion with no synovitis.  Hip joints were difficult to assess in the sitting position.  Knee joints with good range of motion.  He had no tenderness over ankles or MTPs. ? ?CDAI Exam: ?CDAI Score: 0.6  ?  Patient Global: 3 mm; Provider Global: 3 mm ?Swollen: 0 ; Tender: 2  ?Joint Exam 08/16/2021  ? ?   Right  Left  ?Acromioclavicular   Tender   Tender  ? ? ? ?Investigation: ?No additional findings. ? ?Imaging: ?NM PET Image Restage (PS) Whole Body ? ?Result Date: 08/15/2021 ?CLINICAL DATA:  Subsequent treatment strategy for multiple myeloma. EXAM: NUCLEAR MEDICINE PET WHOLE BODY TECHNIQUE: 15.7 mCi F-18 FDG was injected intravenously. Full-ring PET imaging was performed from the head to foot after the radiotracer. CT data was obtained and used for attenuation correction and anatomic localization. Fasting blood glucose: 89 mg/dl COMPARISON:  None Available. FINDINGS: Mediastinal blood pool activity: SUV max 2.8 HEAD/NECK: No hypermetabolic activity in the scalp. No hypermetabolic cervical lymph nodes. Incidental CT findings: none CHEST: No hypermetabolic mediastinal or hilar nodes. No suspicious pulmonary nodules on the CT scan.  Incidental CT findings: none ABDOMEN/PELVIS: No abnormal hypermetabolic activity within the liver, pancreas, adrenal glands, or spleen. No hypermetabolic lymph nodes in the abdomen or pelvis. Incidental CT findings: non

## 2021-08-11 NOTE — Telephone Encounter (Signed)
Patient Advocate Encounter ? ?Insurance verification completed for Lenalidomide (REVLIMID) ? ?The test claim revealed co-pay of, $2681.78.  ? ?The patient is insured through [facility] Medicare  ? ? ?[name], CPhT-Adv ?Pharmacy Patient Advocate Specialist ?[place] Patient Advocate Team ?Direct Number: [phone]  Fax: [phone] ? ? ? ? ? ? ? ?

## 2021-08-11 NOTE — Telephone Encounter (Signed)
Oral Oncology Patient Advocate Encounter ? ?Was successful in securing patient a $12,000 grant from [employer] to provide copayment coverage for Lenalidomide (REVLIMID).  This will keep the out of pocket expense at $0.   ?  ?[employer] ID: [id number] ? ?I have spoken with the patient. ?  ?The billing information is as follows and has been shared with [facility].  ?  ?RxBin: [id number] ?PCN: [id number] ?Member ID: [id number] ?Group ID: [id number] ?Dates of Eligibility: 07/12/21 through 07/12/22 ? ?Fund:  Multiple Myeloma ? ?Eric Cruz, CPhT-Adv ?Pharmacy Patient Advocate Specialist ?[place] [facility] Patient Advocate Team ?Direct Number: [phone]  Fax: [phone] ?  ? ? ?

## 2021-08-12 ENCOUNTER — Other Ambulatory Visit: Payer: Self-pay

## 2021-08-12 ENCOUNTER — Ambulatory Visit: Payer: Medicare Other | Admitting: Rheumatology

## 2021-08-12 ENCOUNTER — Ambulatory Visit
Admission: RE | Admit: 2021-08-12 | Discharge: 2021-08-12 | Disposition: A | Discharge status: Home / Self Care | Payer: Medicare Other | Source: Ambulatory Visit | Attending: Oncology | Admitting: Oncology

## 2021-08-12 DIAGNOSIS — C9000 Multiple myeloma not having achieved remission: Secondary | ICD-10-CM | POA: Insufficient documentation

## 2021-08-12 DIAGNOSIS — Z9889 Other specified postprocedural states: Secondary | ICD-10-CM

## 2021-08-12 DIAGNOSIS — M5000 Cervical disc disorder with myelopathy, unspecified cervical region: Secondary | ICD-10-CM

## 2021-08-12 DIAGNOSIS — M19011 Primary osteoarthritis, right shoulder: Secondary | ICD-10-CM

## 2021-08-12 DIAGNOSIS — K8020 Calculus of gallbladder without cholecystitis without obstruction: Secondary | ICD-10-CM

## 2021-08-12 DIAGNOSIS — Z79899 Other long term (current) drug therapy: Secondary | ICD-10-CM

## 2021-08-12 DIAGNOSIS — M0579 Rheumatoid arthritis with rheumatoid factor of multiple sites without organ or systems involvement: Secondary | ICD-10-CM

## 2021-08-12 DIAGNOSIS — M24521 Contracture, right elbow: Secondary | ICD-10-CM

## 2021-08-12 LAB — GLUCOSE, CAPILLARY: Glucose-Capillary: 89 mg/dL (ref 70–99)

## 2021-08-12 MED ORDER — MONTELUKAST SODIUM 10 MG PO TABS: 10.0000 mg | ORAL_TABLET | ORAL | 0 refills | Status: DC

## 2021-08-12 MED ORDER — MONTELUKAST SODIUM 10 MG PO TABS
10.0000 mg | ORAL_TABLET | ORAL | 0 refills | Status: DC
Start: 2021-08-12 — End: 2021-08-15

## 2021-08-12 MED ORDER — FLUDEOXYGLUCOSE F - 18 (FDG) INJECTION
16.0000 | Freq: Once | INTRAVENOUS | Status: AC | PRN
  Administered 2021-08-12: 15.72 via INTRAVENOUS

## 2021-08-15 ENCOUNTER — Telehealth: Payer: Self-pay | Admitting: Pharmacist

## 2021-08-15 ENCOUNTER — Inpatient Hospital Stay: Payer: Medicare Other

## 2021-08-15 ENCOUNTER — Telehealth: Payer: Self-pay | Admitting: *Deleted

## 2021-08-15 DIAGNOSIS — C9000 Multiple myeloma not having achieved remission: Secondary | ICD-10-CM

## 2021-08-15 MED ORDER — ACYCLOVIR 400 MG PO TABS: 400.0000 mg | ORAL_TABLET | Freq: Two times a day (BID) | ORAL | 5 refills | Status: DC

## 2021-08-15 MED ORDER — PROCHLORPERAZINE MALEATE 10 MG PO TABS: 10.0000 mg | ORAL_TABLET | Freq: Four times a day (QID) | ORAL | 1 refills | Status: DC | PRN

## 2021-08-15 MED ORDER — ASPIRIN EC 81 MG PO TBEC: 81.0000 mg | DELAYED_RELEASE_TABLET | Freq: Every day | ORAL | 3 refills | Status: DC

## 2021-08-15 MED ORDER — ONDANSETRON HCL 8 MG PO TABS: 8.0000 mg | ORAL_TABLET | Freq: Two times a day (BID) | ORAL | 1 refills | Status: DC | PRN

## 2021-08-15 MED ORDER — MONTELUKAST SODIUM 10 MG PO TABS: 10.0000 mg | ORAL_TABLET | ORAL | 0 refills | Status: DC

## 2021-08-15 NOTE — Telephone Encounter (Signed)
Oral Oncology Pharmacist Encounter ? ?Received new prescription for Revlimid (lenalidomide) for the treatment of newly diagnosed IgA lambda multiple myeloma in conjunction with daratumumab sq, bortezomib, dexamethasone, planned duration until disease control or unacceptable drug toxicity. ? ?CMP from 07/11/21 assessed, no relevant lab abnormalities. Prescription dose and frequency assessed.  ? ?Current medication list in Epic reviewed, no DDIs with lenalidomide identified. ? ?Evaluated chart and no patient barriers to medication adherence identified.  ? ?Prescription has been e-scribed to [facility] ? ?Oral Oncology Clinic will continue to follow for initial counseling.  ? ?Patient agreed to treatment on 08/10/21 per MD documentation. ? ?Darl Pikes, PharmD, BCPS, BCOP, CPP ?Hematology/Oncology Clinical Pharmacist Practitioner ?[place]/DB/AP Oral Chemotherapy Navigation Clinic ?[phone] ? ?08/15/2021 11:55 AM ? ?

## 2021-08-15 NOTE — Telephone Encounter (Signed)
Supportive care medications sent to [facility] by MD last week. Patient has not heard anything about getting his medications filled. Redirected his medications to his local [facility].  ?

## 2021-08-15 NOTE — Telephone Encounter (Signed)
Received a call from [facility] reporting that there is a discrepancy with [employer] and the patient DOB. It is listed as [birth year] with [employer] and we have 2051/02/17 They are requesting that we contact [employer] and get this straightened out ?

## 2021-08-16 ENCOUNTER — Encounter: Payer: Self-pay | Admitting: Oncology

## 2021-08-16 ENCOUNTER — Ambulatory Visit: Payer: Medicare Other | Admitting: Rheumatology

## 2021-08-16 ENCOUNTER — Encounter: Payer: Self-pay | Admitting: Rheumatology

## 2021-08-16 VITALS — BP 124/80 | HR 93 | Ht 68.0 in | Wt 322.6 lb

## 2021-08-16 DIAGNOSIS — S63502A Unspecified sprain of left wrist, initial encounter: Secondary | ICD-10-CM

## 2021-08-16 DIAGNOSIS — Z79899 Other long term (current) drug therapy: Secondary | ICD-10-CM | POA: Diagnosis not present

## 2021-08-16 DIAGNOSIS — K8020 Calculus of gallbladder without cholecystitis without obstruction: Secondary | ICD-10-CM | POA: Diagnosis not present

## 2021-08-16 DIAGNOSIS — M19012 Primary osteoarthritis, left shoulder: Secondary | ICD-10-CM

## 2021-08-16 DIAGNOSIS — M24521 Contracture, right elbow: Secondary | ICD-10-CM | POA: Diagnosis not present

## 2021-08-16 DIAGNOSIS — Z9889 Other specified postprocedural states: Secondary | ICD-10-CM | POA: Diagnosis not present

## 2021-08-16 DIAGNOSIS — C9000 Multiple myeloma not having achieved remission: Secondary | ICD-10-CM

## 2021-08-16 DIAGNOSIS — M19011 Primary osteoarthritis, right shoulder: Secondary | ICD-10-CM | POA: Diagnosis not present

## 2021-08-16 DIAGNOSIS — M0579 Rheumatoid arthritis with rheumatoid factor of multiple sites without organ or systems involvement: Secondary | ICD-10-CM | POA: Diagnosis not present

## 2021-08-16 DIAGNOSIS — M5000 Cervical disc disorder with myelopathy, unspecified cervical region: Secondary | ICD-10-CM

## 2021-08-16 NOTE — Telephone Encounter (Signed)
Called [employer], in order to get the DOB corrected, patient would need to sign by the corrected information on the Patient Agreement Form. [employer] is faxing me a copy of that form. Patient will be coming into the office tomorrow morning for Revlimid education, I will have him sign at that time. ? ?After the form is signed it will be faxed back to [facility], they can make the change, and the pharmacy would then be able to fill his medication. ? ?

## 2021-08-16 NOTE — Patient Instructions (Signed)
Standing Labs ?We placed an order today for your standing lab work.  ? ?Please have your standing labs drawn in July and every 3 months ? ?If possible, please have your labs drawn 2 weeks prior to your appointment so that the provider can discuss your results at your appointment. ? ?Please note that you may see your imaging and lab results in [place] before we have reviewed them. ?We may be awaiting multiple results to interpret others before contacting you. ?Please allow our office up to 72 hours to thoroughly review all of the results before contacting the office for clarification of your results. ? ?We have open lab daily: ?Monday through Thursday from 1:30-4:30 PM and Friday from 1:30-4:00 PM ?at the office of Dr. Bo Merino, [facility].   ?Please be advised, all patients with office appointments requiring lab work will take precedent over walk-in lab work.  ?If possible, please come for your lab work on Monday and Friday afternoons, as you may experience shorter wait times. ?The office is located at [address] [place], [place], [place] [address] ?No appointment is necessary.   ?Labs are drawn by [facility]. Please bring your co-pay at the time of your lab draw.  You may receive a bill from [facility] for your lab work. ? ?Please note if you are on Hydroxychloroquine and and an order has been placed for a Hydroxychloroquine level, you will need to have it drawn 4 hours or more after your last dose. ? ?If you wish to have your labs drawn at another location, please call the office 24 hours in advance to send orders. ? ?If you have any questions regarding directions or hours of operation,  ?please call [phone].   ?As a reminder, please drink plenty of water prior to coming for your lab work. Thanks!  ? ?Vaccines ?You are taking a medication(s) that can suppress your immune system.  The following immunizations are recommended: ?Flu annually ?Covid-19  ?Td/Tdap (tetanus, diphtheria, pertussis)  every 10 years ?Pneumonia (Prevnar 15 then Pneumovax 23 at least 1 year apart.  Alternatively, can take Prevnar 20 without needing additional dose) ?Shingrix: 2 doses from 4 weeks to 6 months apart ? ?Please check with your PCP to make sure you are up to date.  ? ?If you have signs or symptoms of an infection or start antibiotics: ?First, call your PCP for workup of your infection. ?Hold your medication through the infection, until you complete your antibiotics, and until symptoms resolve if you take the following: ?Injectable medication (Actemra, Benlysta, Cimzia, Cosentyx, Enbrel, Humira, Kevzara, Orencia, Remicade, Simponi, [place], [place], [place]) ?Methotrexate ?Leflunomide Jolee Ewing) ?Mycophenolate (Cellcept) ?Roma Kayser, or Rinvoq  ?Please get an annual skin examination to screen for skin cancer while you are on Humira. ?

## 2021-08-17 ENCOUNTER — Inpatient Hospital Stay: Payer: Medicare Other | Admitting: Pharmacist

## 2021-08-17 ENCOUNTER — Telehealth: Payer: Self-pay | Admitting: Oncology

## 2021-08-17 DIAGNOSIS — Z79899 Other long term (current) drug therapy: Secondary | ICD-10-CM | POA: Diagnosis not present

## 2021-08-17 DIAGNOSIS — C9000 Multiple myeloma not having achieved remission: Secondary | ICD-10-CM

## 2021-08-17 DIAGNOSIS — Z5112 Encounter for antineoplastic immunotherapy: Secondary | ICD-10-CM | POA: Diagnosis not present

## 2021-08-17 DIAGNOSIS — Z87891 Personal history of nicotine dependence: Secondary | ICD-10-CM | POA: Diagnosis not present

## 2021-08-17 DIAGNOSIS — D649 Anemia, unspecified: Secondary | ICD-10-CM | POA: Diagnosis not present

## 2021-08-17 DIAGNOSIS — M069 Rheumatoid arthritis, unspecified: Secondary | ICD-10-CM | POA: Diagnosis not present

## 2021-08-17 NOTE — Progress Notes (Addendum)
? ?Oral Chemotherapy Clinic ?[place]  ?[phone] [phone] ? ?Patient Care Team: ?Seward Carol, MD as PCP - General (Internal Medicine)  ? ?Name of the patient: Eric Cruz  ?[id number]  ?1950-07-11  ? ?Date of visit: 08/17/21 ? ?HPI: Patient is a 71 y.o. male with newly diagnosed IgA lambda multiple myeloma in conjunction with daratumumab sq, bortezomib, dexamethasone, planned duration until disease control or unacceptable drug toxicity. ? ?Reason for Consult: Revlimid oral chemotherapy education. ? ? ?PAST MEDICAL HISTORY: ?Past Medical History:  ?Diagnosis Date  ? Arthritis   ? BPH (benign prostatic hyperplasia)   ? Cancer [facility])   ? Prostate: states he had a positive biopsy but had another one a year later and it was gone.   ? Depression   ? Dizziness   ? GERD (gastroesophageal reflux disease)   ? Gout   ? High cholesterol   ? Hypertension   ? Sleep apnea   ? has cpap - not currently wearing  ? Varicose veins   ? ? ?HEMATOLOGY/ONCOLOGY HISTORY:  ?Oncology History  ?Multiple myeloma not having achieved remission ([place])  ?04/22/2020 Initial Diagnosis  ? Multiple myeloma not having achieved remission ([place]) ? ?  ?04/22/2020 Cancer Staging  ? Staging form: Plasma Cell Myeloma and Plasma Cell Disorders, AJCC 8th Edition ?- Clinical stage from 04/22/2020: RISS Stage II (Beta-2-microglobulin (mg/L): 2.7, Albumin (g/dL): 3.1, ISS: Stage II, High-risk cytogenetics: Absent, LDH: Normal) - Signed by Earlie Server, MD on 08/10/2021 ?Stage prefix: Initial diagnosis ?Beta 2 microglobulin range (mg/L): Less than 3.5 ?Albumin range (g/dL): Less than 3.5 ?Cytogenetics: 1q addition ?Lactate dehydrogenase (LDH) (U/L): 136 ?Serum calcium level: Normal ?Serum creatinine level: Normal ? ?  ?08/22/2021 -  Chemotherapy  ? Patient is on Treatment Plan : MYELOMA NEWLY DIAGNOSED TRANSPLANT CANDIDATE DaraVRd (Daratumumab SQ) q21d x 6 Cycles (Induction/Consolidation)  ? ?   ? ? ?ALLERGIES:  is allergic to  atorvastatin, simvastatin, other, and statins. ? ?MEDICATIONS:  ?Current Outpatient Medications  ?Medication Sig Dispense Refill  ? acyclovir (ZOVIRAX) 400 MG tablet Take 1 tablet (400 mg total) by mouth 2 (two) times daily. 60 tablet 5  ? Adalimumab (HUMIRA PEN) 40 MG/0.4ML PNKT Inject 40 mg into the skin every 14 (fourteen) days. 2 each 2  ? aspirin EC 81 MG tablet Take 1 tablet (81 mg total) by mouth daily. 100 tablet 3  ? ferrous sulfate 325 (65 FE) MG EC tablet Take 325 mg by mouth daily with breakfast.    ? finasteride (PROSCAR) 5 MG tablet Take 5 mg by mouth daily.    ? folic acid (FOLVITE) 1 MG tablet Take 2 tablets (2 mg total) by mouth daily. 180 tablet 3  ? Gauze Pads & Dressings 2"X2" PADS Applied daily with dressing changes. 21 each 0  ? lenalidomide (REVLIMID) 25 MG capsule Take 1 capsule (25 mg total) by mouth daily. Take for 14 days on, 7 days off, repeat every 21 days. [employer] Auth # [id number]; Date Obtained: 08/11/21 14 capsule 0  ? methotrexate 2.5 MG tablet TAKE 8 TABLETS BY MOUTH ONCE WEEKLY *CAUTION: CHEMOTHERAPY. PROTECTION FROM LIGHT 96 tablet 0  ? montelukast (SINGULAIR) 10 MG tablet Take 1 tablet (10 mg total) by mouth See admin instructions. Singualair 1 tab on the day prior to daratumumab treatments, 1 tab daily for 2 days after treatment. Daratumumab treatment are once a week 30 tablet 0  ? ondansetron (ZOFRAN) 8 MG tablet Take 1 tablet (8 mg total) by mouth 2 (two)  times daily as needed (Nausea or vomiting). 30 tablet 1  ? prochlorperazine (COMPAZINE) 10 MG tablet Take 1 tablet (10 mg total) by mouth every 6 (six) hours as needed (Nausea or vomiting). 30 tablet 1  ? tamsulosin (FLOMAX) 0.4 MG CAPS capsule Take 0.4 mg by mouth daily after supper.    ? ?No current facility-administered medications for this visit.  ? ? ?VITAL SIGNS: ?There were no vitals taken for this visit. ?There were no vitals filed for this visit.  ?Estimated body mass index is 49.05 kg/m? as calculated from the  following: ?  Height as of 08/16/21: _0  (1.727 m). ?  Weight as of 08/16/21: 146.3 kg (322 lb 9.6 oz). ? ?LABS: ?CBC: ?   ?Component Value Date/Time  ? WBC 9.2 07/27/2021 0851  ? HGB 11.1 (L) 07/27/2021 0851  ? HCT 34.5 (L) 07/27/2021 0851  ? PLT 252 07/27/2021 0851  ? MCV 95.0 07/27/2021 0851  ? NEUTROABS 5.6 07/27/2021 0851  ? LYMPHSABS 2.8 07/27/2021 0851  ? MONOABS 0.6 07/27/2021 0851  ? EOSABS 0.2 07/27/2021 0851  ? BASOSABS 0.0 07/27/2021 0851  ? ?Comprehensive Metabolic Panel: ?   ?Component Value Date/Time  ? NA 132 (L) 07/11/2021 0949  ? K 4.1 07/11/2021 0949  ? CL 102 07/11/2021 0949  ? CO2 25 07/11/2021 0949  ? BUN 19 07/11/2021 0949  ? CREATININE 0.97 07/11/2021 0949  ? CREATININE 1.07 03/14/2021 1218  ? GLUCOSE 107 (H) 07/11/2021 0949  ? CALCIUM 8.8 (L) 07/11/2021 0949  ? AST 19 07/11/2021 0949  ? ALT 8 07/11/2021 0949  ? ALKPHOS 73 07/11/2021 0949  ? BILITOT <0.1 (L) 07/11/2021 0949  ? PROT 8.1 07/11/2021 0949  ? ALBUMIN 3.4 (L) 07/11/2021 0949  ? ? ? ?Present during today's visit: Patient and spouse ? ?Start plan: 08/22/21 ?  ?Patient Education ?I spoke with patient for overview of new oral chemotherapy medication: lenalidomide  ? ?Administration: ?Counseled patient on administration, dosing, side effects, monitoring, drug-food interactions, safe handling, storage, and disposal. ?Patient will take 25 mg by mouth daily for 14 days, followed by 7 days off. Repeat every 21 days. ? ?Side Effects: ?Side effects include but not limited to: ?- Rash: Recommended to stay moisturized and avoid sun exposure without covering or sunscreen. ?- Diarrhea: Recommended patient take loperamide as needed for any loose stool. ?- Constipation: Recommended patient take Dulcolax as needed for constipation. If Dulcolax does not relieve the constipation, patient may try Miralax.  ?- Blood clots: Counseled patient on signs and symptoms of a blood clot including, pain in the chest or extremities, shortness of breath, cough, and  swelling of the extremity. Patient will take aspirin 81 mg daily for antiplatelet therapy. ? 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 ?Rheumatology: patient saw this his rheumatologist yesterday, Dr. Estanislado Pandy, she instructed him to hold his methotrexate and Humira until Dr. Berniece Pap him to be resume his RA therapy ? ?Drug-drug Interactions (DDI): ?No DDIs identified ? ?Adherence: ?After discussion with patient, no patient barriers to medication adherence identified.  ?Reviewed with patient importance of keeping a medication schedule and plan for any missed doses. ? ?Mr. Bullen voiced understanding and appreciation. All questions answered. Medication handout provided. ? ?Provided patient with Oral [facility] phone number. Patient knows to call the office with questions or concerns. Oral Chemotherapy Navigation Clinic will continue to follow. ? ?Patient expressed understanding and was in agreement with this plan. He also understands that  He can call clinic at any time with any questions, concerns, or complaints.  ? ?Medication Access Issues: ?Medication is no cost to patient. Lenalidomide will be delivered 5/18.  ? ?Follow-up plan: RTC 08/24/21 ? ?Thank you for allowing me to participate in the care of this patient.  ? ?Time Total: 60 minutes ? ?Visit consisted of counseling and education on dealing with issues of symptom management in the setting of serious and potentially life-threatening illness. Greater than 50% of this time was spent counseling and coordinating care related to the above assessment and plan. ? ?Visit completed in conjunction with: ?Deatra Robinson, PharmD, BCPS ?PGY2 Hematology/Oncology Pharmacy Resident ? ?Signed by: ?Darl Pikes, PharmD, BCPS, BCOP, CPP ?Hematology/Oncology Clinical Pharmacist Practitioner ?[place]/DB/AP Oral Chemotherapy Navigation Clinic ?[phone] ? ?08/17/2021 2:02 PM ? ?

## 2021-08-17 NOTE — Telephone Encounter (Signed)
Pt wife req to have appts moved to a tuesday or wed, *New Chemo scheduled for 05/24.Marland KitchenKJ  ?

## 2021-08-18 ENCOUNTER — Other Ambulatory Visit: Payer: Medicare Other

## 2021-08-22 ENCOUNTER — Inpatient Hospital Stay: Payer: Medicare Other | Admitting: Oncology

## 2021-08-22 ENCOUNTER — Other Ambulatory Visit: Payer: Medicare Other

## 2021-08-22 ENCOUNTER — Inpatient Hospital Stay: Payer: Medicare Other

## 2021-08-24 ENCOUNTER — Inpatient Hospital Stay: Payer: Medicare Other

## 2021-08-24 ENCOUNTER — Encounter: Payer: Self-pay | Admitting: Oncology

## 2021-08-24 ENCOUNTER — Inpatient Hospital Stay (HOSPITAL_BASED_OUTPATIENT_CLINIC_OR_DEPARTMENT_OTHER): Payer: Medicare Other | Admitting: Oncology

## 2021-08-24 VITALS — BP 140/76 | HR 86

## 2021-08-24 VITALS — BP 126/75 | HR 78 | Temp 97.6°F | Resp 18 | Wt 319.3 lb

## 2021-08-24 DIAGNOSIS — Z79899 Other long term (current) drug therapy: Secondary | ICD-10-CM | POA: Diagnosis not present

## 2021-08-24 DIAGNOSIS — C9000 Multiple myeloma not having achieved remission: Secondary | ICD-10-CM

## 2021-08-24 DIAGNOSIS — Z87891 Personal history of nicotine dependence: Secondary | ICD-10-CM | POA: Diagnosis not present

## 2021-08-24 DIAGNOSIS — D649 Anemia, unspecified: Secondary | ICD-10-CM

## 2021-08-24 DIAGNOSIS — Z5111 Encounter for antineoplastic chemotherapy: Secondary | ICD-10-CM | POA: Insufficient documentation

## 2021-08-24 DIAGNOSIS — Z5112 Encounter for antineoplastic immunotherapy: Secondary | ICD-10-CM | POA: Diagnosis not present

## 2021-08-24 DIAGNOSIS — M069 Rheumatoid arthritis, unspecified: Secondary | ICD-10-CM

## 2021-08-24 LAB — COMPREHENSIVE METABOLIC PANEL
ALT: 7 U/L (ref 0–44)
AST: 16 U/L (ref 15–41)
Albumin: 3.4 g/dL — ABNORMAL LOW (ref 3.5–5.0)
Alkaline Phosphatase: 73 U/L (ref 38–126)
Anion gap: 6 (ref 5–15)
BUN: 14 mg/dL (ref 8–23)
CO2: 24 mmol/L (ref 22–32)
Calcium: 8.5 mg/dL — ABNORMAL LOW (ref 8.9–10.3)
Chloride: 106 mmol/L (ref 98–111)
Creatinine, Ser: 1.09 mg/dL (ref 0.61–1.24)
GFR, Estimated: >60 mL/min (ref >60)
Glucose, Bld: 122 mg/dL — ABNORMAL HIGH (ref 70–99)
Potassium: 4 mmol/L (ref 3.5–5.1)
Sodium: 136 mmol/L (ref 135–145)
Total Bilirubin: 0.2 mg/dL — ABNORMAL LOW (ref 0.3–1.2)
Total Protein: 8.1 g/dL (ref 6.5–8.1)

## 2021-08-24 LAB — CBC WITH DIFFERENTIAL/PLATELET
Abs Immature Granulocytes: 0.02 10*3/uL (ref 0.00–0.07)
Basophils Absolute: 0 10*3/uL (ref 0.0–0.1)
Basophils Relative: 0 %
Eosinophils Absolute: 0.2 10*3/uL (ref 0.0–0.5)
Eosinophils Relative: 3 %
HCT: 34.2 % — ABNORMAL LOW (ref 39.0–52.0)
Hemoglobin: 11.1 g/dL — ABNORMAL LOW (ref 13.0–17.0)
Immature Granulocytes: 0 %
Lymphocytes Relative: 28 %
Lymphs Abs: 2.1 10*3/uL (ref 0.7–4.0)
MCH: 31.2 pg (ref 26.0–34.0)
MCHC: 32.5 g/dL (ref 30.0–36.0)
MCV: 96.1 fL (ref 80.0–100.0)
Monocytes Absolute: 0.5 10*3/uL (ref 0.1–1.0)
Monocytes Relative: 6 %
Neutro Abs: 4.7 10*3/uL (ref 1.7–7.7)
Neutrophils Relative %: 63 %
Platelets: 232 10*3/uL (ref 150–400)
RBC: 3.56 MIL/uL — ABNORMAL LOW (ref 4.22–5.81)
RDW: 16.4 % — ABNORMAL HIGH (ref 11.5–15.5)
WBC: 7.6 10*3/uL (ref 4.0–10.5)
nRBC: 0 % (ref 0.0–0.2)

## 2021-08-24 LAB — TYPE AND SCREEN
ABO/RH(D): B POS
Antibody Screen: NEGATIVE

## 2021-08-24 LAB — LACTATE DEHYDROGENASE: LDH: 157 U/L (ref 98–192)

## 2021-08-24 LAB — PRETREATMENT RBC PHENOTYPE: DAT, IgG: NEGATIVE

## 2021-08-24 MED ORDER — ACETAMINOPHEN 325 MG PO TABS
650.0000 mg | ORAL_TABLET | Freq: Once | ORAL | Status: AC
  Administered 2021-08-24: 650 mg via ORAL
  Filled 2021-08-24: qty 2

## 2021-08-24 MED ORDER — MONTELUKAST SODIUM 10 MG PO TABS
10.0000 mg | ORAL_TABLET | Freq: Once | ORAL | Status: AC
  Administered 2021-08-24: 10 mg via ORAL
  Filled 2021-08-24: qty 1

## 2021-08-24 MED ORDER — DIPHENHYDRAMINE HCL 25 MG PO CAPS
50.0000 mg | ORAL_CAPSULE | Freq: Once | ORAL | Status: AC
  Administered 2021-08-24: 50 mg via ORAL
  Filled 2021-08-24: qty 2

## 2021-08-24 MED ORDER — BORTEZOMIB CHEMO SQ INJECTION 3.5 MG (2.5MG/ML)
1.3000 mg/m2 | Freq: Once | INTRAMUSCULAR | Status: AC
  Administered 2021-08-24: 3.5 mg via SUBCUTANEOUS
  Filled 2021-08-24: qty 1.4

## 2021-08-24 MED ORDER — DARATUMUMAB-HYALURONIDASE-FIHJ 1800-30000 MG-UT/15ML ~~LOC~~ SOLN
1800.0000 mg | Freq: Once | SUBCUTANEOUS | Status: AC
  Administered 2021-08-24: 1800 mg via SUBCUTANEOUS
  Filled 2021-08-24: qty 15

## 2021-08-24 MED ORDER — DEXAMETHASONE 4 MG PO TABS
20.0000 mg | ORAL_TABLET | Freq: Once | ORAL | Status: AC
  Administered 2021-08-24: 20 mg via ORAL
  Filled 2021-08-24: qty 5

## 2021-08-24 NOTE — Progress Notes (Signed)
Hematology/Oncology Progress note [phone] [phone]      Patient Care Team: Seward Carol, MD as PCP - General (Internal Medicine)  REFERRING PROVIDER: Seward Carol, MD  CHIEF COMPLAINTS/REASON FOR VISIT:  Follow-up for multiple myeloma  HISTORY OF PRESENTING ILLNESS:  Eric Cruz is a 71 y.o. male who was seen in consultation at the request of Seward Carol, MD for evaluation of abnormal SPEP results, anemia. .   Patient recently had work up done at rheumatologist's office and was diagnosed with rheumatoid arthritis and patient was recommended to start MTX. He has not started yet Labs reviewed,  03/04/20 SPEP showed abnormal protein band of 1.7g/dl.,   , and IFE showed IgA Lamda monoclonal protein.  He has had weight loss last year, no weight loss and some weight gain during the past 6 months.   Multiple join pain.   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  27% plasma cell, cytogenetics - normal, and myeloma FISH panel is negative.  Standard risk.  Congo red staining was added and was negative. normal kidney function, calcium level, hemoglobin above 10 and no bone lesions. albumin is decreased at 3.1, slightly elevated beta microglobulin.  Stage II, anemia with hemoglobin above 10.  # 07/27/2021, bone marrow biopsy showed hypercellular marrow involved by plasma cell myeloma, CD138 immunohistochemistry plasma cells compromise approximately 70% of the cellular elements and are lambda restricted by light chain in situ hybridization.  Cytogenetics showed duplication of 1q.  Cytogenetics is normal.  INTERVAL HISTORY Eric Cruz is a 71 y.o. male who has above history reviewed by me today presents for follow up visit for management of multiple myeloma During the interval, patient has attended a chemotherapy education class.  Patient had  discussion with pharmacist regarding Revlimid instructions. The patient feels well.  Some anxiety prior to starting the first treatments.  Denies nausea vomiting diarrhea fever or chills.  For rheumatoid arthritis, patient was seen by Dr. Estanislado Pandy recently and is currently off methotrexate and Humira.  08/15/2021, PET scan showed no evidence of active myeloma on skull base to thigh FDG PET scan.  No soft tissue plasmacytoma.  No lytic or suspicious lesion on CT portion of examination   Review of Systems  Constitutional:  Positive for fatigue. Negative for appetite change, chills, fever and unexpected weight change.  HENT:   Negative for hearing loss and voice change.   Eyes:  Negative for eye problems and icterus.  Respiratory:  Negative for chest tightness, cough and shortness of breath.   Cardiovascular:  Negative for chest pain and leg swelling.  Gastrointestinal:  Negative for abdominal distention and abdominal pain.  Endocrine: Negative for hot flashes.  Genitourinary:  Negative for difficulty urinating, dysuria and frequency.   Musculoskeletal:  Positive for arthralgias and back pain.  Skin:  Negative for itching and rash.  Neurological:  Negative for light-headedness and numbness.  Hematological:  Negative for adenopathy. Does not bruise/bleed easily.  Psychiatric/Behavioral:  Negative for confusion.     MEDICAL HISTORY:  Past Medical History:  Diagnosis Date   Arthritis    BPH (benign prostatic hyperplasia)    Cancer (HCC)    Prostate: states he had a positive biopsy but had another one a year later and it was gone.    Depression    Dizziness    GERD (gastroesophageal reflux disease)    Gout    High cholesterol  Hypertension    Sleep apnea    has cpap - not currently wearing   Varicose veins     SURGICAL HISTORY: Past Surgical History:  Procedure Laterality Date   ANTERIOR CERVICAL DECOMP/DISCECTOMY FUSION N/A 02/21/2018   Procedure: Cervical Five-Six Cervical  Six-Seven Anterior cervical decompression/discectomy/fusion;  Surgeon: Ashok Pall, MD;  Location: [facility];  Service: Neurosurgery;  Laterality: N/A;  Cervical Five-Six Cervical Six-Seven Anterior cervical decompression/discectomy/fusion   BIOPSY  10/09/2019   Procedure: BIOPSY;  Surgeon: Ronnette Juniper, MD;  Location: [facility];  Service: Gastroenterology;;   BLADDER SURGERY     CARPAL TUNNEL RELEASE Right 02/11/2019   Procedure: Right Carpal Tunnel Wound Irrigation;  Surgeon: Ashok Pall, MD;  Location: [facility];  Service: Neurosurgery;  Laterality: Right;  Right Carpal tunnel wound exploration/wash out   CARPAL TUNNEL RELEASE Bilateral    CHOLECYSTECTOMY  11/22/2012   CHOLECYSTECTOMY  11/22/2012   Procedure: LAPAROSCOPIC CHOLECYSTECTOMY;  Surgeon: Gwenyth Ober, MD;  Location: [facility];  Service: General;;   COLONOSCOPY     COLONOSCOPY WITH PROPOFOL N/A 10/09/2019   Procedure: COLONOSCOPY WITH PROPOFOL;  Surgeon: Ronnette Juniper, MD;  Location: [facility];  Service: Gastroenterology;  Laterality: N/A;   ESOPHAGOGASTRODUODENOSCOPY (EGD) WITH PROPOFOL N/A 10/09/2019   Procedure: ESOPHAGOGASTRODUODENOSCOPY (EGD) WITH PROPOFOL;  Surgeon: Ronnette Juniper, MD;  Location: [facility];  Service: Gastroenterology;  Laterality: N/A;   POLYPECTOMY  10/09/2019   Procedure: POLYPECTOMY;  Surgeon: Ronnette Juniper, MD;  Location: [facility];  Service: Gastroenterology;;    SOCIAL HISTORY: Social History   Socioeconomic History   Marital status: Married    Spouse name: Not on file   Number of children: Not on file   Years of education: Not on file   Highest education level: Not on file  Occupational History   Not on file  Tobacco Use   Smoking status: Former    Packs/day: 1.00    Years: 31.00    Pack years: 31.00    Types: Cigarettes    Quit date: 04/12/1998    Years since quitting: 23.3    Passive exposure: Never   Smokeless tobacco: Never  Vaping Use   Vaping Use: Never used  Substance and Sexual Activity    Alcohol use: No   Drug use: No   Sexual activity: Not on file  Other Topics Concern   Not on file  Social History Narrative   Not on file   Social Determinants of Health   Financial Resource Strain: Not on file  Food Insecurity: Not on file  Transportation Needs: Not on file  Physical Activity: Not on file  Stress: Not on file  Social Connections: Not on file  Intimate Partner Violence: Not on file    FAMILY HISTORY: Family History  Problem Relation Age of Onset   Hypertension Mother    Diabetes Mother    Multiple myeloma Mother    Hypertension Father    Diabetes Father    Healthy Son    Healthy Daughter     ALLERGIES:  is allergic to atorvastatin, simvastatin, other, and statins.  MEDICATIONS:  Current Outpatient Medications  Medication Sig Dispense Refill   acyclovir (ZOVIRAX) 400 MG tablet Take 1 tablet (400 mg total) by mouth 2 (two) times daily. 60 tablet 5   aspirin EC 81 MG tablet Take 1 tablet (81 mg total) by mouth daily. 100 tablet 3   ferrous sulfate 325 (65 FE) MG EC tablet Take 325 mg by mouth daily with breakfast.  finasteride (PROSCAR) 5 MG tablet Take 5 mg by mouth daily.     folic acid (FOLVITE) 1 MG tablet Take 2 tablets (2 mg total) by mouth daily. 180 tablet 3   Gauze Pads & Dressings 2"X2" PADS Applied daily with dressing changes. 21 each 0   lenalidomide (REVLIMID) 25 MG capsule Take 1 capsule (25 mg total) by mouth daily. Take for 14 days on, 7 days off, repeat every 21 days. [employer] Auth # [id number]; Date Obtained: 08/11/21 14 capsule 0   montelukast (SINGULAIR) 10 MG tablet Take 1 tablet (10 mg total) by mouth See admin instructions. Singualair 1 tab on the day prior to daratumumab treatments, 1 tab daily for 2 days after treatment. Daratumumab treatment are once a week 30 tablet 0   ondansetron (ZOFRAN) 8 MG tablet Take 1 tablet (8 mg total) by mouth 2 (two) times daily as needed (Nausea or vomiting). 30 tablet 1   prochlorperazine  (COMPAZINE) 10 MG tablet Take 1 tablet (10 mg total) by mouth every 6 (six) hours as needed (Nausea or vomiting). 30 tablet 1   tamsulosin (FLOMAX) 0.4 MG CAPS capsule Take 0.4 mg by mouth daily after supper.     Adalimumab (HUMIRA PEN) 40 MG/0.4ML PNKT Inject 40 mg into the skin every 14 (fourteen) days. (Patient not taking: Reported on 08/24/2021) 2 each 2   methotrexate 2.5 MG tablet TAKE 8 TABLETS BY MOUTH ONCE WEEKLY *CAUTION: CHEMOTHERAPY. PROTECTION FROM LIGHT (Patient not taking: Reported on 08/24/2021) 96 tablet 0   No current facility-administered medications for this visit.     PHYSICAL EXAMINATION: ECOG PERFORMANCE STATUS: 1 - Symptomatic but completely ambulatory Vitals:   08/24/21 0904  BP: 126/75  Pulse: 78  Resp: 18  Temp: 97.6 F (36.4 C)   Filed Weights   08/24/21 0904  Weight: (!) 319 lb 4.8 oz (144.8 kg)    Physical Exam Constitutional:      General: He is not in acute distress.    Appearance: He is obese.     Comments: Patient walks with a cane.  HENT:     Head: Normocephalic and atraumatic.  Eyes:     General: No scleral icterus. Cardiovascular:     Rate and Rhythm: Normal rate and regular rhythm.     Heart sounds: Normal heart sounds.  Pulmonary:     Effort: Pulmonary effort is normal. No respiratory distress.     Breath sounds: No wheezing.  Abdominal:     General: Bowel sounds are normal. There is no distension.     Palpations: Abdomen is soft.  Musculoskeletal:        General: No deformity. Normal range of motion.     Cervical back: Normal range of motion and neck supple.     Comments: Trace edema, bilateral low extremities.   Skin:    General: Skin is warm and dry.     Findings: No erythema or rash.  Neurological:     Mental Status: He is alert and oriented to person, place, and time. Mental status is at baseline.     Cranial Nerves: No cranial nerve deficit.     Coordination: Coordination normal.  Psychiatric:        Mood and Affect:  Mood normal.     LABORATORY DATA:  I have reviewed the data as listed Lab Results  Component Value Date   WBC 7.6 08/24/2021   HGB 11.1 (L) 08/24/2021   HCT 34.2 (L) 08/24/2021   MCV 96.1 08/24/2021  PLT 232 08/24/2021   Recent Labs    04/05/21 1008 07/11/21 0949 08/24/21 0822  NA 135 132* 136  K 4.0 4.1 4.0  CL 102 102 106  CO2 '24 25 24  ' GLUCOSE 120* 107* 122*  BUN '13 19 14  ' CREATININE 0.97 0.97 1.09  CALCIUM 8.9 8.8* 8.5*  GFRNONAA >60 >60 >60  PROT 8.2* 8.1 8.1  ALBUMIN 3.4* 3.4* 3.4*  AST '26 19 16  ' ALT '14 8 7  ' ALKPHOS 77 73 73  BILITOT <0.1* <0.1* 0.2*    Iron/TIBC/Ferritin/ %Sat    Component Value Date/Time   IRON 78 04/05/2021 1008   TIBC 274 04/05/2021 1008   FERRITIN 425 (H) 04/05/2021 1008   IRONPCTSAT 28 04/05/2021 1008      RADIOGRAPHIC STUDIES: I have personally reviewed the radiological images as listed and agreed with the findings in the report.  NM PET Image Restage (PS) Whole Body  Result Date: 08/15/2021 CLINICAL DATA:  Subsequent treatment strategy for multiple myeloma. EXAM: NUCLEAR MEDICINE PET WHOLE BODY TECHNIQUE: 15.7 mCi F-18 FDG was injected intravenously. Full-ring PET imaging was performed from the head to foot after the radiotracer. CT data was obtained and used for attenuation correction and anatomic localization. Fasting blood glucose: 89 mg/dl COMPARISON:  None Available. FINDINGS: Mediastinal blood pool activity: SUV max 2.8 HEAD/NECK: No hypermetabolic activity in the scalp. No hypermetabolic cervical lymph nodes. Incidental CT findings: none CHEST: No hypermetabolic mediastinal or hilar nodes. No suspicious pulmonary nodules on the CT scan. Incidental CT findings: none ABDOMEN/PELVIS: No abnormal hypermetabolic activity within the liver, pancreas, adrenal glands, or spleen. No hypermetabolic lymph nodes in the abdomen or pelvis. Incidental CT findings: none SKELETON: No focal hypermetabolic activity to suggest skeletal metastasis.  Incidental CT findings: No lytic lesions on CT portion exam. EXTREMITIES: No abnormal hypermetabolic activity in the lower extremities. Incidental CT findings: No lytic lesion on CT portion exam. IMPRESSION: 1. No evidence of active myeloma on skull base to thigh FDG PET scan. 2. No soft tissue plasmacytoma. 3. No lytic or suspicious lesion on CT portion exam. Electronically Signed   By: Suzy Bouchard M.D.   On: 08/15/2021 16:13   US ARTERIAL SEGMENTAL EXERCISE (USE FOR CLAUDICATION)  Result Date: 08/02/2021 CLINICAL DATA:  Decreased pedal pulses EXAM: NONINVASIVE PHYSIOLOGIC VASCULAR STUDY OF BILATERAL LOWER EXTREMITIES WITH AND WITHOUT EXERCISE TECHNIQUE: Evaluation of both lower extremities were performed at rest, including calculation of ankle-brachial indices, multiple segmental pressure evaluation, segmental Doppler and segmental pulse volume recording. Ankle brachial indices were also obtained following treadmill exercise. FINDINGS: Right Lower Extremity Resting ABI:  1.53 Resting TBI: 1.100 Post Exercise ABI:1.08 Segmental Pressures: Normal segmental pressures, no significant (20 mmHg) pressure gradient between adjacent segments. Great toe pressure: 137 mm Hg Arterial Waveforms: Normal multiphasic arterial waveforms. PVRs: Normal PVRs with maintained waveform amplitude, augmentation and quality. Left Lower Extremity: Resting ABI: 1.38 Resting TBI: 0.82 Post Exercise ABI:1.06 Segmental Pressures: Normal segmental pressures, no significant (20 mmHg) pressure gradient between adjacent segments. Great toe pressure: 103 mm Hg Arterial Waveforms: Normal multiphasic arterial waveforms. PVRs: Normal PVRs with maintained waveform amplitude, augmentation and quality. Other: Symmetric upper extremity pressures. IMPRESSION: Evaluation of the right lower extremity is slightly limited due to decreased compressibility of the posterior tibial artery. However, there is no definitive evidence of significant lower  extremity arterial occlusive disease. Electronically Signed   By: Miachel Roux M.D.   On: 08/02/2021 15:14   CT BONE MARROW BIOPSY & ASPIRATION  Result Date:  07/27/2021 INDICATION: Multiple myeloma EXAM: CT GUIDED BONE MARROW ASPIRATION AND CORE BIOPSY RADIATION DOSE REDUCTION: This exam was performed according to the departmental dose-optimization program which includes automated exposure control, adjustment of the mA and/or kV according to patient size and/or use of iterative reconstruction technique. MEDICATIONS: None. ANESTHESIA/SEDATION: Moderate (conscious) sedation was employed during this procedure. A total of 1.5 milligrams versed and 75 micrograms fentanyl were administered intravenously. The patient's level of consciousness and vital signs were monitored continuously by radiology nursing throughout the procedure under my direct supervision. Total monitored sedation time: 12 minutes FLUOROSCOPY TIME:  CT dose in mGy was not provided. COMPLICATIONS: None immediate. Estimated blood loss: <5 mL PROCEDURE: Informed written consent was obtained from the patient after a thorough discussion of the procedural risks, benefits and alternatives. All questions were addressed. Maximal Sterile Barrier Technique was utilized including caps, mask, sterile gowns, sterile gloves, sterile drape, hand hygiene and skin antiseptic. A timeout was performed prior to the initiation of the procedure. The patient was positioned prone and non-contrast localization CT was performed of the pelvis to demonstrate the iliac marrow spaces. Maximal barrier sterile technique utilized including caps, mask, sterile gowns, sterile gloves, large sterile drape, hand hygiene, and chlorhexidine prep. Under sterile conditions and local anesthesia, an 11 gauge coaxial bone biopsy needle was advanced into the RIGHT iliac marrow space. Needle position was confirmed with CT imaging. Initially, bone marrow aspiration was performed. Next, the 11 gauge  outer cannula was utilized to obtain a 2 iliac bone marrow core biopsy. Needle was removed. Hemostasis was obtained with compression. The patient tolerated the procedure well. Samples were prepared with the cytotechnologist. IMPRESSION: Successful CT-guided bone marrow aspiration and biopsy, as above. Michaelle Birks, MD [facility] Radiology Electronically Signed   By: Michaelle Birks M.D.   On: 07/27/2021 11:51     ASSESSMENT & PLAN:  1. Multiple myeloma not having achieved remission ([place])   2. Rheumatoid arthritis, involving unspecified site, unspecified whether rheumatoid factor present ([place])   3. Encounter for antineoplastic chemotherapy   4. Normocytic anemia    Cancer Staging  Multiple myeloma not having achieved remission ([place]) Staging form: Plasma Cell Myeloma and Plasma Cell Disorders, AJCC 8th Edition - Clinical stage from 04/22/2020: RISS Stage II (Beta-2-microglobulin (mg/L): 2.7, Albumin (g/dL): 3.1, ISS: Stage II, High-risk cytogenetics: Absent, LDH: Normal) - Signed by Earlie Server, MD on 08/10/2021   #IgA lambda multiple myeloma, M protein 2.3, normal free light chain ratio Bone marrow biopsy was reviewed and discussed with patient.  70% plasma cell, cytogenetics - normal, and myeloma FISH panel showed duplication of 1 q.  High risk, No hypercalcemia, no significant anemia [hemoglobin11.1], stable kidney function Plasma cell above 60%, warrants initiation of myeloma treatments. Labs were reviewed and discussed with patient Proceed with cycle 1 Dara-RVD Singualair 69m 1 day prior to daratumumab treatments, 2 days after treatment.  PET was reviewed and discussed with patient.   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  Close monitor.  Discussed about possibility of starting neuropathy medication.  # Normocytic anemia, secondary to chronic disease and RA treatment. #Rheumatoid arthritis, follow up with rheumatology.   Currently he is off Humira and methotrexate.  I agree with holding the treatment to avoid synergistic bone marrow suppression.  If his symptoms get worse, we will further discuss with patient's rheumatologist about resuming Dr. DEstanislado Pandytreatments.   All questions were answered. The patient knows to call the clinic with any problems questions  or concerns.  Cc Seward Carol, MD  Return of visit 1 week lab MD Dara-Velcade Dex    Earlie Server, MD, PhD 08/24/2021

## 2021-08-24 NOTE — Progress Notes (Signed)
Patient here for follow up. No new concerns voiced. Revlimid calendar given per Alyson L.

## 2021-08-24 NOTE — Patient Instructions (Signed)
[facility] [place]  Discharge Instructions: Thank you for choosing [facility] to provide your oncology and hematology care.  If you have a lab appointment with the [facility], please go directly to the [facility] and check in at the registration area.  Wear comfortable clothing and clothing appropriate for easy access to any Portacath or PICC line.   We strive to give you quality time with your provider. You may need to reschedule your appointment if you arrive late (15 or more minutes).  Arriving late affects you and other patients whose appointments are after yours.  Also, if you miss three or more appointments without notifying the office, you may be dismissed from the clinic at the provider's discretion.      For prescription refill requests, have your pharmacy contact our office and allow 72 hours for refills to be completed.    Today you received the following chemotherapy and/or immunotherapy agents: Darzalex Faspro, Velcade      To help prevent nausea and vomiting after your treatment, we encourage you to take your nausea medication as directed.  BELOW ARE SYMPTOMS THAT SHOULD BE REPORTED IMMEDIATELY: *FEVER GREATER THAN 100.4 F (38 C) OR HIGHER *CHILLS OR SWEATING *NAUSEA AND VOMITING THAT IS NOT CONTROLLED WITH YOUR NAUSEA MEDICATION *UNUSUAL SHORTNESS OF BREATH *UNUSUAL BRUISING OR BLEEDING *URINARY PROBLEMS (pain or burning when urinating, or frequent urination) *BOWEL PROBLEMS (unusual diarrhea, constipation, pain near the anus) TENDERNESS IN MOUTH AND THROAT WITH OR WITHOUT PRESENCE OF ULCERS (sore throat, sores in mouth, or a toothache) UNUSUAL RASH, SWELLING OR PAIN  UNUSUAL VAGINAL DISCHARGE OR ITCHING   Items with * indicate a potential emergency and should be followed up as soon as possible or go to the Emergency Department if any problems should occur.  Please show the CHEMOTHERAPY ALERT CARD or IMMUNOTHERAPY ALERT CARD  at check-in to the Emergency Department and triage nurse.  Should you have questions after your visit or need to cancel or reschedule your appointment, please contact [facility] [place] [facility] [place]  [phone] and follow the prompts.  Office hours are 8:00 a.m. to 4:30 p.m. Monday - Friday. Please note that voicemails left after 4:00 p.m. may not be returned until the following business day.  We are closed weekends and major holidays. You have access to a nurse at all times for urgent questions. Please call the main number to the clinic [phone] and follow the prompts.  For any non-urgent questions, you may also contact your provider using MyChart. We now offer e-Visits for anyone 10 and older to request care online for non-urgent symptoms. For details visit [email or web address].   Also download the MyChart app! Go to the app store, search "MyChart", open the app, select [facility], and log in with your MyChart username and password.  Due to Covid, a mask is required upon entering the hospital/clinic. If you do not have a mask, one will be given to you upon arrival. For doctor visits, patients may have 1 support person aged 32 or older with them. For treatment visits, patients cannot have anyone with them due to current Covid guidelines and our immunocompromised population.

## 2021-08-25 ENCOUNTER — Telehealth: Payer: Self-pay | Admitting: Rheumatology

## 2021-08-25 ENCOUNTER — Telehealth: Payer: Self-pay

## 2021-08-25 LAB — KAPPA/LAMBDA LIGHT CHAINS
Kappa free light chain: 21.1 mg/L — ABNORMAL HIGH (ref 3.3–19.4)
Kappa, lambda light chain ratio: 0.29 (ref 0.26–1.65)
Lambda free light chains: 71.6 mg/L — ABNORMAL HIGH (ref 5.7–26.3)

## 2021-08-25 LAB — BETA 2 MICROGLOBULIN, SERUM: Beta-2 Microglobulin: 2.2 mg/L (ref 0.6–2.4)

## 2021-08-25 NOTE — Telephone Encounter (Signed)
I returned patient's call.  I explained to him that I received a note from Dr. Tasia Catchings explaining why he had to stop Humira.  Dr. Tasia Catchings will let us know when to restart Humira.  He voiced understanding.

## 2021-08-25 NOTE — Telephone Encounter (Signed)
Patient left a voicemail stating he was being taken off Humira by Dr. Tasia Catchings while he undergoes chemo. Patient requests a call back to update someone as to why he has to stop. [phone]

## 2021-08-25 NOTE — Telephone Encounter (Signed)
Contacted [employer] and spoke with Shanon Brow. Duffy Rhody that patient currently has MTX on hold. Advised we will send a new prescription once patient is able to resume the medication.

## 2021-08-25 NOTE — Telephone Encounter (Signed)
Telephone call to patient for follow up after receiving first infusion.   No answer but left message stating we were calling to check on them.  Encouraged patient to call for any questions or concerns.   

## 2021-08-25 NOTE — Telephone Encounter (Signed)
Patient called back requesting the office contact [facility] and put a hold on his Humira and Methotrexate medications until he finishes chemotherapy.  Patient states he will call the office when Dr. Tasia Catchings clears him to restart the medications.

## 2021-08-30 ENCOUNTER — Encounter: Payer: Self-pay | Admitting: Oncology

## 2021-08-30 LAB — MULTIPLE MYELOMA PANEL, SERUM
Albumin SerPl Elph-Mcnc: 3.5 g/dL (ref 2.9–4.4)
Albumin/Glob SerPl: 0.9 (ref 0.7–1.7)
Alpha 1: 0.2 g/dL (ref 0.0–0.4)
Alpha2 Glob SerPl Elph-Mcnc: 0.6 g/dL (ref 0.4–1.0)
B-Globulin SerPl Elph-Mcnc: 2.3 g/dL — ABNORMAL HIGH (ref 0.7–1.3)
Gamma Glob SerPl Elph-Mcnc: 1.1 g/dL (ref 0.4–1.8)
Globulin, Total: 4.1 g/dL — ABNORMAL HIGH (ref 2.2–3.9)
IgA: 2279 mg/dL — ABNORMAL HIGH (ref 61–437)
IgG (Immunoglobin G), Serum: 825 mg/dL (ref 603–1613)
IgM (Immunoglobulin M), Srm: 56 mg/dL (ref 20–172)
M Protein SerPl Elph-Mcnc: 1.6 g/dL — ABNORMAL HIGH
Total Protein ELP: 7.6 g/dL (ref 6.0–8.5)

## 2021-09-02 ENCOUNTER — Inpatient Hospital Stay: Payer: Medicare Other | Attending: Oncology

## 2021-09-02 ENCOUNTER — Other Ambulatory Visit: Payer: Self-pay | Admitting: *Deleted

## 2021-09-02 ENCOUNTER — Inpatient Hospital Stay: Payer: Medicare Other

## 2021-09-02 ENCOUNTER — Encounter: Payer: Self-pay | Admitting: Oncology

## 2021-09-02 ENCOUNTER — Inpatient Hospital Stay: Payer: Medicare Other | Admitting: Oncology

## 2021-09-02 VITALS — BP 140/70 | HR 81 | Temp 97.8°F | Ht 68.0 in | Wt 317.0 lb

## 2021-09-02 DIAGNOSIS — Z79899 Other long term (current) drug therapy: Secondary | ICD-10-CM | POA: Insufficient documentation

## 2021-09-02 DIAGNOSIS — Z5112 Encounter for antineoplastic immunotherapy: Secondary | ICD-10-CM | POA: Diagnosis not present

## 2021-09-02 DIAGNOSIS — G629 Polyneuropathy, unspecified: Secondary | ICD-10-CM | POA: Insufficient documentation

## 2021-09-02 DIAGNOSIS — D649 Anemia, unspecified: Secondary | ICD-10-CM | POA: Insufficient documentation

## 2021-09-02 DIAGNOSIS — Z87891 Personal history of nicotine dependence: Secondary | ICD-10-CM | POA: Insufficient documentation

## 2021-09-02 DIAGNOSIS — Z5111 Encounter for antineoplastic chemotherapy: Secondary | ICD-10-CM

## 2021-09-02 DIAGNOSIS — Z7982 Long term (current) use of aspirin: Secondary | ICD-10-CM | POA: Diagnosis not present

## 2021-09-02 DIAGNOSIS — Z7961 Long term (current) use of immunomodulator: Secondary | ICD-10-CM | POA: Diagnosis not present

## 2021-09-02 DIAGNOSIS — C9000 Multiple myeloma not having achieved remission: Secondary | ICD-10-CM

## 2021-09-02 DIAGNOSIS — M069 Rheumatoid arthritis, unspecified: Secondary | ICD-10-CM

## 2021-09-02 LAB — COMPREHENSIVE METABOLIC PANEL
ALT: 7 U/L (ref 0–44)
AST: 16 U/L (ref 15–41)
Albumin: 3.5 g/dL (ref 3.5–5.0)
Alkaline Phosphatase: 69 U/L (ref 38–126)
Anion gap: 8 (ref 5–15)
BUN: 15 mg/dL (ref 8–23)
CO2: 26 mmol/L (ref 22–32)
Calcium: 8.8 mg/dL — ABNORMAL LOW (ref 8.9–10.3)
Chloride: 102 mmol/L (ref 98–111)
Creatinine, Ser: 1.19 mg/dL (ref 0.61–1.24)
GFR, Estimated: >60 mL/min (ref >60)
Glucose, Bld: 101 mg/dL — ABNORMAL HIGH (ref 70–99)
Potassium: 4.1 mmol/L (ref 3.5–5.1)
Sodium: 136 mmol/L (ref 135–145)
Total Bilirubin: 0.2 mg/dL — ABNORMAL LOW (ref 0.3–1.2)
Total Protein: 7.5 g/dL (ref 6.5–8.1)

## 2021-09-02 LAB — CBC WITH DIFFERENTIAL/PLATELET
Abs Immature Granulocytes: 0.02 10*3/uL (ref 0.00–0.07)
Basophils Absolute: 0 10*3/uL (ref 0.0–0.1)
Basophils Relative: 0 %
Eosinophils Absolute: 0.3 10*3/uL (ref 0.0–0.5)
Eosinophils Relative: 5 %
HCT: 35.6 % — ABNORMAL LOW (ref 39.0–52.0)
Hemoglobin: 11.4 g/dL — ABNORMAL LOW (ref 13.0–17.0)
Immature Granulocytes: 0 %
Lymphocytes Relative: 16 %
Lymphs Abs: 1.1 10*3/uL (ref 0.7–4.0)
MCH: 30.9 pg (ref 26.0–34.0)
MCHC: 32 g/dL (ref 30.0–36.0)
MCV: 96.5 fL (ref 80.0–100.0)
Monocytes Absolute: 0.6 10*3/uL (ref 0.1–1.0)
Monocytes Relative: 8 %
Neutro Abs: 5 10*3/uL (ref 1.7–7.7)
Neutrophils Relative %: 71 %
Platelets: 203 10*3/uL (ref 150–400)
RBC: 3.69 MIL/uL — ABNORMAL LOW (ref 4.22–5.81)
RDW: 16.5 % — ABNORMAL HIGH (ref 11.5–15.5)
WBC: 7 10*3/uL (ref 4.0–10.5)
nRBC: 0 % (ref 0.0–0.2)

## 2021-09-02 MED ORDER — MONTELUKAST SODIUM 10 MG PO TABS
10.0000 mg | ORAL_TABLET | Freq: Once | ORAL | Status: AC
  Administered 2021-09-02: 10 mg via ORAL
  Filled 2021-09-02: qty 1

## 2021-09-02 MED ORDER — ACETAMINOPHEN 325 MG PO TABS
650.0000 mg | ORAL_TABLET | Freq: Once | ORAL | Status: AC
  Administered 2021-09-02: 650 mg via ORAL
  Filled 2021-09-02: qty 2

## 2021-09-02 MED ORDER — BORTEZOMIB CHEMO SQ INJECTION 3.5 MG (2.5MG/ML)
1.3000 mg/m2 | Freq: Once | INTRAMUSCULAR | Status: AC
  Administered 2021-09-02: 3.5 mg via SUBCUTANEOUS
  Filled 2021-09-02: qty 1.4

## 2021-09-02 MED ORDER — DIPHENHYDRAMINE HCL 25 MG PO CAPS
50.0000 mg | ORAL_CAPSULE | Freq: Once | ORAL | Status: AC
  Administered 2021-09-02: 50 mg via ORAL
  Filled 2021-09-02: qty 2

## 2021-09-02 MED ORDER — DEXAMETHASONE 4 MG PO TABS
20.0000 mg | ORAL_TABLET | Freq: Once | ORAL | Status: AC
  Administered 2021-09-02: 20 mg via ORAL
  Filled 2021-09-02: qty 5

## 2021-09-02 MED ORDER — LENALIDOMIDE 25 MG PO CAPS: 25.0000 mg | ORAL_CAPSULE | Freq: Every day | ORAL | 0 refills | Status: DC

## 2021-09-02 MED ORDER — DARATUMUMAB-HYALURONIDASE-FIHJ 1800-30000 MG-UT/15ML ~~LOC~~ SOLN
1800.0000 mg | Freq: Once | SUBCUTANEOUS | Status: AC
  Administered 2021-09-02: 1800 mg via SUBCUTANEOUS
  Filled 2021-09-02: qty 15

## 2021-09-02 NOTE — Telephone Encounter (Signed)
Spoke with ABBVIE and placed Humira on hold until we reach back out to advise he is restarting.

## 2021-09-02 NOTE — Patient Instructions (Signed)
[facility] CANCER CTR AT [place]  Discharge Instructions: Thank you for choosing [facility] to provide your oncology and hematology care.  If you have a lab appointment with the [facility], please go directly to the [place] and check in at the registration area.  Wear comfortable clothing and clothing appropriate for easy access to any Portacath or PICC line.   We strive to give you quality time with your provider. You may need to reschedule your appointment if you arrive late (15 or more minutes).  Arriving late affects you and other patients whose appointments are after yours.  Also, if you miss three or more appointments without notifying the office, you may be dismissed from the clinic at the provider's discretion.      For prescription refill requests, have your pharmacy contact our office and allow 72 hours for refills to be completed.    Today you received the following chemotherapy and/or immunotherapy agents Velcade and Daratumumab.      To help prevent nausea and vomiting after your treatment, we encourage you to take your nausea medication as directed.  BELOW ARE SYMPTOMS THAT SHOULD BE REPORTED IMMEDIATELY: *FEVER GREATER THAN 100.4 F (38 C) OR HIGHER *CHILLS OR SWEATING *NAUSEA AND VOMITING THAT IS NOT CONTROLLED WITH YOUR NAUSEA MEDICATION *UNUSUAL SHORTNESS OF BREATH *UNUSUAL BRUISING OR BLEEDING *URINARY PROBLEMS (pain or burning when urinating, or frequent urination) *BOWEL PROBLEMS (unusual diarrhea, constipation, pain near the anus) TENDERNESS IN MOUTH AND THROAT WITH OR WITHOUT PRESENCE OF ULCERS (sore throat, sores in mouth, or a toothache) UNUSUAL RASH, SWELLING OR PAIN  UNUSUAL VAGINAL DISCHARGE OR ITCHING   Items with * indicate a potential emergency and should be followed up as soon as possible or go to the Emergency Department if any problems should occur.  Please show the CHEMOTHERAPY ALERT CARD or IMMUNOTHERAPY ALERT CARD  at check-in to the Emergency Department and triage nurse.  Should you have questions after your visit or need to cancel or reschedule your appointment, please contact [facility] CANCER [place] AT [place]  [phone] and follow the prompts.  Office hours are 8:00 a.m. to 4:30 p.m. Monday - Friday. Please note that voicemails left after 4:00 p.m. may not be returned until the following business day.  We are closed weekends and major holidays. You have access to a nurse at all times for urgent questions. Please call the main number to the clinic [phone] and follow the prompts.  For any non-urgent questions, you may also contact your provider using MyChart. We now offer e-Visits for anyone 54 and older to request care online for non-urgent symptoms. For details visit [email or web address].   Also download the MyChart app! Go to the app store, search "MyChart", open the app, select [place], and log in with your MyChart username and password.  Due to Covid, a mask is required upon entering the hospital/clinic. If you do not have a mask, one will be given to you upon arrival. For doctor visits, patients may have 1 support person aged 83 or older with them. For treatment visits, patients cannot have anyone with them due to current Covid guidelines and our immunocompromised population.

## 2021-09-02 NOTE — Progress Notes (Signed)
Hematology/Oncology Progress note [phone] [phone]      Patient Care Team: Seward Carol, MD as PCP - General (Internal Medicine)  REFERRING PROVIDER: Seward Carol, MD  CHIEF COMPLAINTS/REASON FOR VISIT:  Follow-up for multiple myeloma  HISTORY OF PRESENTING ILLNESS:  Eric Cruz is a 71 y.o. male who was seen in consultation at the request of Seward Carol, MD for evaluation of abnormal SPEP results, anemia. .   Patient recently had work up done at rheumatologist's office and was diagnosed with rheumatoid arthritis and patient was recommended to start MTX. He has not started yet Labs reviewed,  03/04/20 SPEP showed abnormal protein band of 1.7g/dl.,   , and IFE showed IgA Lamda monoclonal protein.  He has had weight loss last year, no weight loss and some weight gain during the past 6 months.   Multiple join pain.   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  27% plasma cell, cytogenetics - normal, and myeloma FISH panel is negative.  Standard risk.  Congo red staining was added and was negative. normal kidney function, calcium level, hemoglobin above 10 and no bone lesions. albumin is decreased at 3.1, slightly elevated beta microglobulin.  Stage II, anemia with hemoglobin above 10.  # 07/27/2021, bone marrow biopsy showed hypercellular marrow involved by plasma cell myeloma, CD138 immunohistochemistry plasma cells compromise approximately 70% of the cellular elements and are lambda restricted by light chain in situ hybridization.  Cytogenetics showed duplication of 1q.  Cytogenetics is normal.  For rheumatoid arthritis, patient was seen by Dr. Estanislado Pandy recently and is currently off methotrexate and Humira.  08/15/2021, PET scan showed no evidence of active myeloma on skull base to thigh FDG PET scan.  No soft tissue plasmacytoma.  No lytic or  suspicious lesion on CT portion of examination  INTERVAL HISTORY Eric Cruz is a 71 y.o. male who has above history reviewed by me today presents for follow up visit for management of multiple myeloma Patient tolerated daratumumab Velcade last week.  He is on Revlimid.  Denies any nausea vomiting diarrhea.   Review of Systems  Constitutional:  Positive for fatigue. Negative for appetite change, chills, fever and unexpected weight change.  HENT:   Negative for hearing loss and voice change.   Eyes:  Negative for eye problems and icterus.  Respiratory:  Negative for chest tightness, cough and shortness of breath.   Cardiovascular:  Negative for chest pain and leg swelling.  Gastrointestinal:  Negative for abdominal distention and abdominal pain.  Endocrine: Negative for hot flashes.  Genitourinary:  Negative for difficulty urinating, dysuria and frequency.   Musculoskeletal:  Positive for arthralgias and back pain.  Skin:  Negative for itching and rash.  Neurological:  Negative for light-headedness and numbness.  Hematological:  Negative for adenopathy. Does not bruise/bleed easily.  Psychiatric/Behavioral:  Negative for confusion.     MEDICAL HISTORY:  Past Medical History:  Diagnosis Date   Arthritis    BPH (benign prostatic hyperplasia)    Cancer (HCC)    Prostate: states he had a positive biopsy but had another one a year later and it was gone.    Depression    Dizziness    GERD (gastroesophageal reflux disease)    Gout    High cholesterol    Hypertension    Sleep apnea    has cpap - not currently wearing   Varicose veins  SURGICAL HISTORY: Past Surgical History:  Procedure Laterality Date   ANTERIOR CERVICAL DECOMP/DISCECTOMY FUSION N/A 02/21/2018   Procedure: Cervical Five-Six Cervical Six-Seven Anterior cervical decompression/discectomy/fusion;  Surgeon: Ashok Pall, MD;  Location: [place];  Service: Neurosurgery;  Laterality: N/A;  Cervical Five-Six  Cervical Six-Seven Anterior cervical decompression/discectomy/fusion   BIOPSY  10/09/2019   Procedure: BIOPSY;  Surgeon: Ronnette Juniper, MD;  Location: [facility];  Service: Gastroenterology;;   BLADDER SURGERY     CARPAL TUNNEL RELEASE Right 02/11/2019   Procedure: Right Carpal Tunnel Wound Irrigation;  Surgeon: Ashok Pall, MD;  Location: [place];  Service: Neurosurgery;  Laterality: Right;  Right Carpal tunnel wound exploration/wash out   CARPAL TUNNEL RELEASE Bilateral    CHOLECYSTECTOMY  11/22/2012   CHOLECYSTECTOMY  11/22/2012   Procedure: LAPAROSCOPIC CHOLECYSTECTOMY;  Surgeon: Gwenyth Ober, MD;  Location: [place];  Service: General;;   COLONOSCOPY     COLONOSCOPY WITH PROPOFOL N/A 10/09/2019   Procedure: COLONOSCOPY WITH PROPOFOL;  Surgeon: Ronnette Juniper, MD;  Location: [facility];  Service: Gastroenterology;  Laterality: N/A;   ESOPHAGOGASTRODUODENOSCOPY (EGD) WITH PROPOFOL N/A 10/09/2019   Procedure: ESOPHAGOGASTRODUODENOSCOPY (EGD) WITH PROPOFOL;  Surgeon: Ronnette Juniper, MD;  Location: [facility];  Service: Gastroenterology;  Laterality: N/A;   POLYPECTOMY  10/09/2019   Procedure: POLYPECTOMY;  Surgeon: Ronnette Juniper, MD;  Location: [facility];  Service: Gastroenterology;;    SOCIAL HISTORY: Social History   Socioeconomic History   Marital status: Married    Spouse name: Not on file   Number of children: Not on file   Years of education: Not on file   Highest education level: Not on file  Occupational History   Not on file  Tobacco Use   Smoking status: Former    Packs/day: 1.00    Years: 31.00    Pack years: 31.00    Types: Cigarettes    Quit date: 04/12/1998    Years since quitting: 23.4    Passive exposure: Never   Smokeless tobacco: Never  Vaping Use   Vaping Use: Never used  Substance and Sexual Activity   Alcohol use: No   Drug use: No   Sexual activity: Not on file  Other Topics Concern   Not on file  Social History Narrative   Not on file   Social  Determinants of Health   Financial Resource Strain: Not on file  Food Insecurity: Not on file  Transportation Needs: Not on file  Physical Activity: Not on file  Stress: Not on file  Social Connections: Not on file  Intimate Partner Violence: Not on file    FAMILY HISTORY: Family History  Problem Relation Age of Onset   Hypertension Mother    Diabetes Mother    Multiple myeloma Mother    Hypertension Father    Diabetes Father    Healthy Son    Healthy Daughter     ALLERGIES:  is allergic to atorvastatin, simvastatin, other, and statins.  MEDICATIONS:  Current Outpatient Medications  Medication Sig Dispense Refill   acyclovir (ZOVIRAX) 400 MG tablet Take 1 tablet (400 mg total) by mouth 2 (two) times daily. 60 tablet 5   aspirin EC 81 MG tablet Take 1 tablet (81 mg total) by mouth daily. 100 tablet 3   ferrous sulfate 325 (65 FE) MG EC tablet Take 325 mg by mouth daily with breakfast.     finasteride (PROSCAR) 5 MG tablet Take 5 mg by mouth daily.     folic acid (FOLVITE) 1 MG tablet  Take 2 tablets (2 mg total) by mouth daily. 180 tablet 3   Gauze Pads & Dressings 2"X2" PADS Applied daily with dressing changes. 21 each 0   montelukast (SINGULAIR) 10 MG tablet Take 1 tablet (10 mg total) by mouth See admin instructions. Singualair 1 tab on the day prior to daratumumab treatments, 1 tab daily for 2 days after treatment. Daratumumab treatment are once a week 30 tablet 0   ondansetron (ZOFRAN) 8 MG tablet Take 1 tablet (8 mg total) by mouth 2 (two) times daily as needed (Nausea or vomiting). 30 tablet 1   prochlorperazine (COMPAZINE) 10 MG tablet Take 1 tablet (10 mg total) by mouth every 6 (six) hours as needed (Nausea or vomiting). 30 tablet 1   tamsulosin (FLOMAX) 0.4 MG CAPS capsule Take 0.4 mg by mouth daily after supper.     Adalimumab (HUMIRA PEN) 40 MG/0.4ML PNKT Inject 40 mg into the skin every 14 (fourteen) days. (Patient not taking: Reported on 08/24/2021) 2 each 2    lenalidomide (REVLIMID) 25 MG capsule Take 1 capsule (25 mg total) by mouth daily. Take for 14 days on, 7 days off, repeat every 21 days. [employer] Auth # ; Date Obtained: 14 capsule 0   methotrexate 2.5 MG tablet TAKE 8 TABLETS BY MOUTH ONCE WEEKLY *CAUTION: CHEMOTHERAPY. PROTECTION FROM LIGHT (Patient not taking: Reported on 08/24/2021) 96 tablet 0   No current facility-administered medications for this visit.     PHYSICAL EXAMINATION: ECOG PERFORMANCE STATUS: 1 - Symptomatic but completely ambulatory Vitals:   09/02/21 0919  BP: 140/70  Pulse: 81  Temp: 97.8 F (36.6 C)   Filed Weights   09/02/21 0919  Weight: (!) 317 lb (143.8 kg)    Physical Exam Constitutional:      General: He is not in acute distress.    Appearance: He is obese.     Comments: Patient walks with a cane.  HENT:     Head: Normocephalic and atraumatic.  Eyes:     General: No scleral icterus. Cardiovascular:     Rate and Rhythm: Normal rate and regular rhythm.     Heart sounds: Normal heart sounds.  Pulmonary:     Effort: Pulmonary effort is normal. No respiratory distress.     Breath sounds: No wheezing.  Abdominal:     General: Bowel sounds are normal. There is no distension.     Palpations: Abdomen is soft.  Musculoskeletal:        General: No deformity. Normal range of motion.     Cervical back: Normal range of motion and neck supple.     Comments: Trace edema, bilateral low extremities.   Skin:    General: Skin is warm and dry.     Findings: No erythema or rash.  Neurological:     Mental Status: He is alert and oriented to person, place, and time. Mental status is at baseline.     Cranial Nerves: No cranial nerve deficit.     Coordination: Coordination normal.  Psychiatric:        Mood and Affect: Mood normal.     LABORATORY DATA:  I have reviewed the data as listed Lab Results  Component Value Date   WBC 7.0 09/02/2021   HGB 11.4 (L) 09/02/2021   HCT 35.6 (L) 09/02/2021   MCV 96.5  09/02/2021   PLT 203 09/02/2021   Recent Labs    07/11/21 0949 08/24/21 0822 09/02/21 0830  NA 132* 136 136  K 4.1 4.0 4.1  CL 102 106 102  CO2 '25 24 26  ' GLUCOSE 107* 122* 101*  BUN '19 14 15  ' CREATININE 0.97 1.09 1.19  CALCIUM 8.8* 8.5* 8.8*  GFRNONAA >60 >60 >60  PROT 8.1 8.1 7.5  ALBUMIN 3.4* 3.4* 3.5  AST '19 16 16  ' ALT '8 7 7  ' ALKPHOS 73 73 69  BILITOT <0.1* 0.2* 0.2*    Iron/TIBC/Ferritin/ %Sat    Component Value Date/Time   IRON 78 04/05/2021 1008   TIBC 274 04/05/2021 1008   FERRITIN 425 (H) 04/05/2021 1008   IRONPCTSAT 28 04/05/2021 1008      RADIOGRAPHIC STUDIES: I have personally reviewed the radiological images as listed and agreed with the findings in the report.  NM PET Image Restage (PS) Whole Body  Result Date: 08/15/2021 CLINICAL DATA:  Subsequent treatment strategy for multiple myeloma. EXAM: NUCLEAR MEDICINE PET WHOLE BODY TECHNIQUE: 15.7 mCi F-18 FDG was injected intravenously. Full-ring PET imaging was performed from the head to foot after the radiotracer. CT data was obtained and used for attenuation correction and anatomic localization. Fasting blood glucose: 89 mg/dl COMPARISON:  None Available. FINDINGS: Mediastinal blood pool activity: SUV max 2.8 HEAD/NECK: No hypermetabolic activity in the scalp. No hypermetabolic cervical lymph nodes. Incidental CT findings: none CHEST: No hypermetabolic mediastinal or hilar nodes. No suspicious pulmonary nodules on the CT scan. Incidental CT findings: none ABDOMEN/PELVIS: No abnormal hypermetabolic activity within the liver, pancreas, adrenal glands, or spleen. No hypermetabolic lymph nodes in the abdomen or pelvis. Incidental CT findings: none SKELETON: No focal hypermetabolic activity to suggest skeletal metastasis. Incidental CT findings: No lytic lesions on CT portion exam. EXTREMITIES: No abnormal hypermetabolic activity in the lower extremities. Incidental CT findings: No lytic lesion on CT portion exam.  IMPRESSION: 1. No evidence of active myeloma on skull base to thigh FDG PET scan. 2. No soft tissue plasmacytoma. 3. No lytic or suspicious lesion on CT portion exam. Electronically Signed   By: Suzy Bouchard M.D.   On: 08/15/2021 16:13     ASSESSMENT & PLAN:  1. Multiple myeloma not having achieved remission ([place])   2. Encounter for antineoplastic chemotherapy   3. Rheumatoid arthritis, involving unspecified site, unspecified whether rheumatoid factor present ([place])   4. Normocytic anemia   5. Neuropathy    Cancer Staging  Multiple myeloma not having achieved remission ([place]) Staging form: Plasma Cell Myeloma and Plasma Cell Disorders, AJCC 8th Edition - Clinical stage from 04/22/2020: RISS Stage II (Beta-2-microglobulin (mg/L): 2.7, Albumin (g/dL): 3.1, ISS: Stage II, High-risk cytogenetics: Absent, LDH: Normal) - Signed by Earlie Server, MD on 08/10/2021   #IgA lambda multiple myeloma, M protein 2.3, normal free light chain ratio Bone marrow biopsy was reviewed and discussed with patient.  70% plasma cell, cytogenetics - normal, and myeloma FISH panel showed duplication of 1 q.  High risk, No hypercalcemia, no significant anemia [hemoglobin11.1], stable kidney function Plasma cell above 60%, warrants initiation of myeloma treatments. Labs reviewed and discussed with patient. Proceed with cycle 1 day 8 Dara-RVD Singualair 37m 1 day prior to daratumumab treatments, 2 days after treatment.   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  Stable symptoms.  Observation #Morbid obesity, we reviewed and discussed about lifestyle modification, healthy diet and exercise.  # Normocytic anemia, secondary to chronic disease and RA treatment. #Rheumatoid arthritis, follow up with rheumatology.  Currently he is off Humira and methotrexate.  I agree with holding the treatment to avoid synergistic bone marrow suppression.  If his symptoms  get worse, we will further discuss with patient's  rheumatologist about resuming Dr. Estanislado Pandy treatments.   All questions were answered. The patient knows to call the clinic with any problems questions or concerns.  Cc Seward Carol, MD  Return of visit 1 week lab  Dara-Velcade Dex  2 weeks lab MD Dara-Velcade Dex     Earlie Server, MD, PhD 09/02/2021

## 2021-09-05 ENCOUNTER — Other Ambulatory Visit: Payer: Self-pay | Admitting: Pharmacist

## 2021-09-07 ENCOUNTER — Ambulatory Visit: Payer: Medicare Other | Admitting: Oncology

## 2021-09-07 ENCOUNTER — Other Ambulatory Visit: Payer: Medicare Other

## 2021-09-07 ENCOUNTER — Ambulatory Visit: Payer: Medicare Other

## 2021-09-09 ENCOUNTER — Inpatient Hospital Stay: Payer: Medicare Other

## 2021-09-09 ENCOUNTER — Other Ambulatory Visit: Payer: Self-pay | Admitting: Oncology

## 2021-09-09 ENCOUNTER — Other Ambulatory Visit: Payer: Self-pay

## 2021-09-09 VITALS — BP 138/79 | HR 69 | Temp 97.9°F | Resp 17 | Ht 68.0 in | Wt 319.0 lb

## 2021-09-09 DIAGNOSIS — Z5112 Encounter for antineoplastic immunotherapy: Secondary | ICD-10-CM | POA: Diagnosis not present

## 2021-09-09 DIAGNOSIS — M069 Rheumatoid arthritis, unspecified: Secondary | ICD-10-CM | POA: Diagnosis not present

## 2021-09-09 DIAGNOSIS — G629 Polyneuropathy, unspecified: Secondary | ICD-10-CM | POA: Diagnosis not present

## 2021-09-09 DIAGNOSIS — Z7982 Long term (current) use of aspirin: Secondary | ICD-10-CM | POA: Diagnosis not present

## 2021-09-09 DIAGNOSIS — C9000 Multiple myeloma not having achieved remission: Secondary | ICD-10-CM

## 2021-09-09 DIAGNOSIS — D649 Anemia, unspecified: Secondary | ICD-10-CM | POA: Diagnosis not present

## 2021-09-09 DIAGNOSIS — Z87891 Personal history of nicotine dependence: Secondary | ICD-10-CM | POA: Diagnosis not present

## 2021-09-09 DIAGNOSIS — Z79899 Other long term (current) drug therapy: Secondary | ICD-10-CM | POA: Diagnosis not present

## 2021-09-09 DIAGNOSIS — Z7961 Long term (current) use of immunomodulator: Secondary | ICD-10-CM | POA: Diagnosis not present

## 2021-09-09 LAB — CBC WITH DIFFERENTIAL/PLATELET
Abs Immature Granulocytes: 0.03 10*3/uL (ref 0.00–0.07)
Basophils Absolute: 0 10*3/uL (ref 0.0–0.1)
Basophils Relative: 0 %
Eosinophils Absolute: 0.3 10*3/uL (ref 0.0–0.5)
Eosinophils Relative: 4 %
HCT: 34.4 % — ABNORMAL LOW (ref 39.0–52.0)
Hemoglobin: 11.1 g/dL — ABNORMAL LOW (ref 13.0–17.0)
Immature Granulocytes: 1 %
Lymphocytes Relative: 21 %
Lymphs Abs: 1.3 10*3/uL (ref 0.7–4.0)
MCH: 30.7 pg (ref 26.0–34.0)
MCHC: 32.3 g/dL (ref 30.0–36.0)
MCV: 95 fL (ref 80.0–100.0)
Monocytes Absolute: 0.9 10*3/uL (ref 0.1–1.0)
Monocytes Relative: 15 %
Neutro Abs: 3.7 10*3/uL (ref 1.7–7.7)
Neutrophils Relative %: 59 %
Platelets: 192 10*3/uL (ref 150–400)
RBC: 3.62 MIL/uL — ABNORMAL LOW (ref 4.22–5.81)
RDW: 16 % — ABNORMAL HIGH (ref 11.5–15.5)
WBC: 6.1 10*3/uL (ref 4.0–10.5)
nRBC: 0 % (ref 0.0–0.2)

## 2021-09-09 LAB — COMPREHENSIVE METABOLIC PANEL
ALT: 8 U/L (ref 0–44)
AST: 15 U/L (ref 15–41)
Albumin: 3.4 g/dL — ABNORMAL LOW (ref 3.5–5.0)
Alkaline Phosphatase: 68 U/L (ref 38–126)
Anion gap: 7 (ref 5–15)
BUN: 17 mg/dL (ref 8–23)
CO2: 23 mmol/L (ref 22–32)
Calcium: 8.5 mg/dL — ABNORMAL LOW (ref 8.9–10.3)
Chloride: 106 mmol/L (ref 98–111)
Creatinine, Ser: 1.05 mg/dL (ref 0.61–1.24)
GFR, Estimated: >60 mL/min (ref >60)
Glucose, Bld: 99 mg/dL (ref 70–99)
Potassium: 4 mmol/L (ref 3.5–5.1)
Sodium: 136 mmol/L (ref 135–145)
Total Bilirubin: 0.3 mg/dL (ref 0.3–1.2)
Total Protein: 7.1 g/dL (ref 6.5–8.1)

## 2021-09-09 MED ORDER — ACETAMINOPHEN 325 MG PO TABS
650.0000 mg | ORAL_TABLET | Freq: Once | ORAL | Status: AC
  Administered 2021-09-09: 650 mg via ORAL
  Filled 2021-09-09: qty 2

## 2021-09-09 MED ORDER — MONTELUKAST SODIUM 10 MG PO TABS
10.0000 mg | ORAL_TABLET | Freq: Once | ORAL | Status: AC
  Administered 2021-09-09: 10 mg via ORAL
  Filled 2021-09-09: qty 1

## 2021-09-09 MED ORDER — DARATUMUMAB-HYALURONIDASE-FIHJ 1800-30000 MG-UT/15ML ~~LOC~~ SOLN
1800.0000 mg | Freq: Once | SUBCUTANEOUS | Status: AC
  Administered 2021-09-09: 1800 mg via SUBCUTANEOUS
  Filled 2021-09-09: qty 15

## 2021-09-09 MED ORDER — DIPHENHYDRAMINE HCL 25 MG PO CAPS
50.0000 mg | ORAL_CAPSULE | Freq: Once | ORAL | Status: AC
  Administered 2021-09-09: 50 mg via ORAL
  Filled 2021-09-09: qty 2

## 2021-09-09 MED ORDER — DEXAMETHASONE 4 MG PO TABS
20.0000 mg | ORAL_TABLET | Freq: Once | ORAL | Status: AC
  Administered 2021-09-09: 20 mg via ORAL
  Filled 2021-09-09: qty 5

## 2021-09-09 NOTE — Patient Instructions (Signed)
[facility] CANCER CTR AT [place]  Discharge Instructions: Thank you for choosing [facility] to provide your oncology and hematology care.  If you have a lab appointment with the [facility], please go directly to the [facility] and check in at the registration area.  Wear comfortable clothing and clothing appropriate for easy access to any Portacath or PICC line.   We strive to give you quality time with your provider. You may need to reschedule your appointment if you arrive late (15 or more minutes).  Arriving late affects you and other patients whose appointments are after yours.  Also, if you miss three or more appointments without notifying the office, you may be dismissed from the clinic at the provider's discretion.      For prescription refill requests, have your pharmacy contact our office and allow 72 hours for refills to be completed.    Today you received the following chemotherapy and/or immunotherapy agents Darzalex.      To help prevent nausea and vomiting after your treatment, we encourage you to take your nausea medication as directed.  BELOW ARE SYMPTOMS THAT SHOULD BE REPORTED IMMEDIATELY: *FEVER GREATER THAN 100.4 F (38 C) OR HIGHER *CHILLS OR SWEATING *NAUSEA AND VOMITING THAT IS NOT CONTROLLED WITH YOUR NAUSEA MEDICATION *UNUSUAL SHORTNESS OF BREATH *UNUSUAL BRUISING OR BLEEDING *URINARY PROBLEMS (pain or burning when urinating, or frequent urination) *BOWEL PROBLEMS (unusual diarrhea, constipation, pain near the anus) TENDERNESS IN MOUTH AND THROAT WITH OR WITHOUT PRESENCE OF ULCERS (sore throat, sores in mouth, or a toothache) UNUSUAL RASH, SWELLING OR PAIN  UNUSUAL VAGINAL DISCHARGE OR ITCHING   Items with * indicate a potential emergency and should be followed up as soon as possible or go to the Emergency Department if any problems should occur.  Please show the CHEMOTHERAPY ALERT CARD or IMMUNOTHERAPY ALERT CARD at check-in to  the Emergency Department and triage nurse.  Should you have questions after your visit or need to cancel or reschedule your appointment, please contact [facility] CANCER [place] AT [place]  [phone] and follow the prompts.  Office hours are 8:00 a.m. to 4:30 p.m. Monday - Friday. Please note that voicemails left after 4:00 p.m. may not be returned until the following business day.  We are closed weekends and major holidays. You have access to a nurse at all times for urgent questions. Please call the main number to the clinic [phone] and follow the prompts.  For any non-urgent questions, you may also contact your provider using MyChart. We now offer e-Visits for anyone 48 and older to request care online for non-urgent symptoms. For details visit [email or web address].   Also download the MyChart app! Go to the app store, search "MyChart", open the app, select [place], and log in with your MyChart username and password.  Due to Covid, a mask is required upon entering the hospital/clinic. If you do not have a mask, one will be given to you upon arrival. For doctor visits, patients may have 1 support person aged 45 or older with them. For treatment visits, patients cannot have anyone with them due to current Covid guidelines and our immunocompromised population.

## 2021-09-21 ENCOUNTER — Other Ambulatory Visit: Payer: Self-pay

## 2021-09-21 ENCOUNTER — Encounter: Payer: Self-pay | Admitting: Oncology

## 2021-09-21 ENCOUNTER — Inpatient Hospital Stay: Payer: Medicare Other

## 2021-09-21 ENCOUNTER — Inpatient Hospital Stay: Payer: Medicare Other | Admitting: Oncology

## 2021-09-21 DIAGNOSIS — G629 Polyneuropathy, unspecified: Secondary | ICD-10-CM

## 2021-09-21 DIAGNOSIS — C9000 Multiple myeloma not having achieved remission: Secondary | ICD-10-CM

## 2021-09-21 DIAGNOSIS — D649 Anemia, unspecified: Secondary | ICD-10-CM

## 2021-09-21 DIAGNOSIS — Z87891 Personal history of nicotine dependence: Secondary | ICD-10-CM | POA: Diagnosis not present

## 2021-09-21 DIAGNOSIS — M069 Rheumatoid arthritis, unspecified: Secondary | ICD-10-CM | POA: Diagnosis not present

## 2021-09-21 DIAGNOSIS — Z79899 Other long term (current) drug therapy: Secondary | ICD-10-CM | POA: Diagnosis not present

## 2021-09-21 DIAGNOSIS — Z5111 Encounter for antineoplastic chemotherapy: Secondary | ICD-10-CM | POA: Diagnosis not present

## 2021-09-21 DIAGNOSIS — Z7982 Long term (current) use of aspirin: Secondary | ICD-10-CM | POA: Diagnosis not present

## 2021-09-21 DIAGNOSIS — Z7961 Long term (current) use of immunomodulator: Secondary | ICD-10-CM | POA: Diagnosis not present

## 2021-09-21 DIAGNOSIS — Z5112 Encounter for antineoplastic immunotherapy: Secondary | ICD-10-CM | POA: Diagnosis not present

## 2021-09-21 LAB — CBC WITH DIFFERENTIAL/PLATELET
Abs Immature Granulocytes: 0.12 10*3/uL — ABNORMAL HIGH (ref 0.00–0.07)
Basophils Absolute: 0.1 10*3/uL (ref 0.0–0.1)
Basophils Relative: 1 %
Eosinophils Absolute: 0.1 10*3/uL (ref 0.0–0.5)
Eosinophils Relative: 1 %
HCT: 36.1 % — ABNORMAL LOW (ref 39.0–52.0)
Hemoglobin: 11.5 g/dL — ABNORMAL LOW (ref 13.0–17.0)
Immature Granulocytes: 1 %
Lymphocytes Relative: 26 %
Lymphs Abs: 2.2 10*3/uL (ref 0.7–4.0)
MCH: 30.7 pg (ref 26.0–34.0)
MCHC: 31.9 g/dL (ref 30.0–36.0)
MCV: 96.3 fL (ref 80.0–100.0)
Monocytes Absolute: 0.6 10*3/uL (ref 0.1–1.0)
Monocytes Relative: 6 %
Neutro Abs: 5.7 10*3/uL (ref 1.7–7.7)
Neutrophils Relative %: 65 %
Platelets: 284 10*3/uL (ref 150–400)
RBC: 3.75 MIL/uL — ABNORMAL LOW (ref 4.22–5.81)
RDW: 15.9 % — ABNORMAL HIGH (ref 11.5–15.5)
WBC: 8.7 10*3/uL (ref 4.0–10.5)
nRBC: 0 % (ref 0.0–0.2)

## 2021-09-21 LAB — COMPREHENSIVE METABOLIC PANEL
ALT: 7 U/L (ref 0–44)
AST: 14 U/L — ABNORMAL LOW (ref 15–41)
Albumin: 3.4 g/dL — ABNORMAL LOW (ref 3.5–5.0)
Alkaline Phosphatase: 83 U/L (ref 38–126)
Anion gap: 5 (ref 5–15)
BUN: 16 mg/dL (ref 8–23)
CO2: 23 mmol/L (ref 22–32)
Calcium: 8.6 mg/dL — ABNORMAL LOW (ref 8.9–10.3)
Chloride: 107 mmol/L (ref 98–111)
Creatinine, Ser: 0.95 mg/dL (ref 0.61–1.24)
GFR, Estimated: >60 mL/min (ref >60)
Glucose, Bld: 104 mg/dL — ABNORMAL HIGH (ref 70–99)
Potassium: 3.9 mmol/L (ref 3.5–5.1)
Sodium: 135 mmol/L (ref 135–145)
Total Bilirubin: 0.3 mg/dL (ref 0.3–1.2)
Total Protein: 7 g/dL (ref 6.5–8.1)

## 2021-09-21 MED ORDER — DEXAMETHASONE 4 MG PO TABS
20.0000 mg | ORAL_TABLET | Freq: Once | ORAL | Status: AC
  Administered 2021-09-21: 20 mg via ORAL
  Filled 2021-09-21: qty 5

## 2021-09-21 MED ORDER — ACETAMINOPHEN 325 MG PO TABS
650.0000 mg | ORAL_TABLET | Freq: Once | ORAL | Status: AC
  Administered 2021-09-21: 650 mg via ORAL
  Filled 2021-09-21: qty 2

## 2021-09-21 MED ORDER — DARATUMUMAB-HYALURONIDASE-FIHJ 1800-30000 MG-UT/15ML ~~LOC~~ SOLN
1800.0000 mg | Freq: Once | SUBCUTANEOUS | Status: AC
  Administered 2021-09-21: 1800 mg via SUBCUTANEOUS
  Filled 2021-09-21: qty 15

## 2021-09-21 MED ORDER — MUPIROCIN CALCIUM 2 % EX CREA: 1.0000 | TOPICAL_CREAM | Freq: Two times a day (BID) | CUTANEOUS | 0 refills | Status: DC

## 2021-09-21 MED ORDER — DOXYCYCLINE HYCLATE 100 MG PO TABS: 100.0000 mg | ORAL_TABLET | Freq: Two times a day (BID) | ORAL | 0 refills | Status: DC

## 2021-09-21 MED ORDER — DIPHENHYDRAMINE HCL 25 MG PO CAPS
50.0000 mg | ORAL_CAPSULE | Freq: Once | ORAL | Status: AC
  Administered 2021-09-21: 50 mg via ORAL
  Filled 2021-09-21: qty 2

## 2021-09-21 MED ORDER — BORTEZOMIB CHEMO SQ INJECTION 3.5 MG (2.5MG/ML)
1.3000 mg/m2 | Freq: Once | INTRAMUSCULAR | Status: AC
  Administered 2021-09-21: 3.5 mg via SUBCUTANEOUS
  Filled 2021-09-21: qty 1.4

## 2021-09-21 NOTE — Assessment & Plan Note (Signed)
Chemotherapy plan as listed above 

## 2021-09-21 NOTE — Assessment & Plan Note (Signed)
ollow up with rheumatology.  Currently he is off Humira and methotrexate.  I agree with holding the treatment to avoid synergistic bone marrow suppression.  If his symptoms get worse, we will further discuss with patient's rheumatologist about resuming Dr. Deveshwar treatments. 

## 2021-09-21 NOTE — Patient Instructions (Signed)
[facility]  Discharge Instructions: Thank you for choosing [facility] to provide your oncology and hematology care.   If you have a lab appointment with the Cancer Center, please go directly to the Cancer Center and check in at the registration area.   Wear comfortable clothing and clothing appropriate for easy access to any Portacath or PICC line.   We strive to give you quality time with your provider. You may need to reschedule your appointment if you arrive late (15 or more minutes).  Arriving late affects you and other patients whose appointments are after yours.  Also, if you miss three or more appointments without notifying the office, you may be dismissed from the clinic at the provider's discretion.      For prescription refill requests, have your pharmacy contact our office and allow 72 hours for refills to be completed.    Today you received the following chemotherapy and/or immunotherapy agents       To help prevent nausea and vomiting after your treatment, we encourage you to take your nausea medication as directed.  BELOW ARE SYMPTOMS THAT SHOULD BE REPORTED IMMEDIATELY: *FEVER GREATER THAN 100.4 F (38 C) OR HIGHER *CHILLS OR SWEATING *NAUSEA AND VOMITING THAT IS NOT CONTROLLED WITH YOUR NAUSEA MEDICATION *UNUSUAL SHORTNESS OF BREATH *UNUSUAL BRUISING OR BLEEDING *URINARY PROBLEMS (pain or burning when urinating, or frequent urination) *BOWEL PROBLEMS (unusual diarrhea, constipation, pain near the anus) TENDERNESS IN MOUTH AND THROAT WITH OR WITHOUT PRESENCE OF ULCERS (sore throat, sores in mouth, or a toothache) UNUSUAL RASH, SWELLING OR PAIN  UNUSUAL VAGINAL DISCHARGE OR ITCHING   Items with * indicate a potential emergency and should be followed up as soon as possible or go to the Emergency Department if any problems should occur.  Please show the CHEMOTHERAPY ALERT CARD or IMMUNOTHERAPY ALERT CARD at check-in to the  Emergency Department and triage nurse.  Should you have questions after your visit or need to cancel or reschedule your appointment, please contact [facility]  Dept: [phone]  and follow the prompts.  Office hours are 8:00 a.m. to 4:30 p.m. Monday - Friday. Please note that voicemails left after 4:00 p.m. may not be returned until the following business day.  We are closed weekends and major holidays. You have access to a nurse at all times for urgent questions. Please call the main number to the clinic Dept: [phone] and follow the prompts.   For any non-urgent questions, you may also contact your provider using MyChart. We now offer e-Visits for anyone 18 and older to request care online for non-urgent symptoms. For details visit [email or web address].   Also download the MyChart app! Go to the app store, search "MyChart", open the app, select [facility], and log in with your MyChart username and password.  Masks are optional in the cancer centers. If you would like for your care team to wear a mask while they are taking care of you, please let them know. For doctor visits, patients may have with them one support person who is at least 69 years old. At this time, visitors are not allowed in the infusion area. 

## 2021-09-21 NOTE — Assessment & Plan Note (Signed)
secondary to carpal tunnel disease and chemotherapy Stable symptoms.  Observation 

## 2021-09-21 NOTE — Assessment & Plan Note (Addendum)
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 Proceed with cycle 2  Dara-RVD Singualair 1m 1 day prior to daratumumab treatments, 2 days after treatment.

## 2021-09-21 NOTE — Progress Notes (Signed)
Hematology/Oncology Progress note [phone] [phone]      Patient Care Team: Seward Carol, MD as PCP - General (Internal Medicine)  ASSESSMENT & PLAN:   Multiple myeloma not having achieved remission ([place]) IgA lambda multiple myeloma, 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 Proceed with cycle 2  Dara-RVD Singualair 24m 1 day prior to daratumumab treatments, 2 days after treatment.   Encounter for antineoplastic chemotherapy Chemotherapy plan as listed above.  Rheumatoid arthritis (HSidney ollow up with rheumatology.  Currently he is off Humira and methotrexate.  I agree with holding the treatment to avoid synergistic bone marrow suppression.  If his symptoms get worse, we will further discuss with patient's rheumatologist about resuming Dr. DEstanislado Pandytreatments.  Normocytic anemia secondary to chronic disease and RA treatment.  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  Observation  No orders of the defined types were placed in this encounter.  Follow up  1 week lab Dara-Velcade Dex  2 weeks lab Dara-Velcade Dex  3 weeks lab MD lab MD Dara-Velcade Dex   All questions were answered. The patient knows to call the clinic with any problems, questions or concerns.  ZEarlie Server MD, PhD [facility] 09/21/2021    CHIEF COMPLAINTS/REASON FOR VISIT:  Follow-up for multiple myeloma  HISTORY OF PRESENTING ILLNESS:  Eric DRAHEIMis a 71y.o. male who was seen in consultation at the request of PSeward Carol MD for evaluation of abnormal SPEP results, anemia. .   Patient recently had work up done at rheumatologist's office and was diagnosed with rheumatoid arthritis and patient was recommended to start MTX. He has not started yet Labs reviewed,  03/04/20 SPEP showed abnormal protein band of 1.7g/dl.,   , and IFE showed IgA Lamda monoclonal  protein.  He has had weight loss last year, no weight loss and some weight gain during the past 6 months.   Multiple join pain.   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  27% plasma cell, cytogenetics - normal, and myeloma FISH panel is negative.  Standard risk.  Congo red staining was added and was negative. normal kidney function, calcium level, hemoglobin above 10 and no bone lesions. albumin is decreased at 3.1, slightly elevated beta microglobulin.  Stage II, anemia with hemoglobin above 10.  # 07/27/2021, bone marrow biopsy showed hypercellular marrow involved by plasma cell myeloma, CD138 immunohistochemistry plasma cells compromise approximately 70% of the cellular elements and are lambda restricted by light chain in situ hybridization.  Cytogenetics showed duplication of 1q.  Cytogenetics is normal.  For rheumatoid arthritis, patient was seen by Dr. DEstanislado Pandyrecently and is currently off methotrexate and Humira.  08/15/2021, PET scan showed no evidence of active myeloma on skull base to thigh FDG PET scan.  No soft tissue plasmacytoma.  No lytic or suspicious lesion on CT portion of examination  INTERVAL HISTORY Eric MAFFEOis a 71y.o. male who has above history reviewed by me today presents for follow up visit for management of multiple myeloma Patient tolerated daratumumab Velcade last week.  He is on Revlimid.  Denies any nausea vomiting diarrhea.   Review of Systems  Constitutional:  Positive for fatigue. Negative for appetite change, chills, fever and unexpected weight change.  HENT:   Negative for hearing loss and voice change.   Eyes:  Negative for  eye problems and icterus.  Respiratory:  Negative for chest tightness, cough and shortness of breath.   Cardiovascular:  Negative for chest pain and leg swelling.  Gastrointestinal:  Negative for abdominal distention and  abdominal pain.  Endocrine: Negative for hot flashes.  Genitourinary:  Negative for difficulty urinating, dysuria and frequency.   Musculoskeletal:  Positive for arthralgias and back pain.  Skin:  Negative for itching and rash.  Neurological:  Negative for light-headedness and numbness.  Hematological:  Negative for adenopathy. Does not bruise/bleed easily.  Psychiatric/Behavioral:  Negative for confusion.      MEDICAL HISTORY:  Past Medical History:  Diagnosis Date   Arthritis    BPH (benign prostatic hyperplasia)    Cancer (HCC)    Prostate: states he had a positive biopsy but had another one a year later and it was gone.    Depression    Dizziness    GERD (gastroesophageal reflux disease)    Gout    High cholesterol    Hypertension    Sleep apnea    has cpap - not currently wearing   Varicose veins     SURGICAL HISTORY: Past Surgical History:  Procedure Laterality Date   ANTERIOR CERVICAL DECOMP/DISCECTOMY FUSION N/A 02/21/2018   Procedure: Cervical Five-Six Cervical Six-Seven Anterior cervical decompression/discectomy/fusion;  Surgeon: Ashok Pall, MD;  Location: [facility];  Service: Neurosurgery;  Laterality: N/A;  Cervical Five-Six Cervical Six-Seven Anterior cervical decompression/discectomy/fusion   BIOPSY  10/09/2019   Procedure: BIOPSY;  Surgeon: Ronnette Juniper, MD;  Location: [facility];  Service: Gastroenterology;;   BLADDER SURGERY     CARPAL TUNNEL RELEASE Right 02/11/2019   Procedure: Right Carpal Tunnel Wound Irrigation;  Surgeon: Ashok Pall, MD;  Location: [facility];  Service: Neurosurgery;  Laterality: Right;  Right Carpal tunnel wound exploration/wash out   CARPAL TUNNEL RELEASE Bilateral    CHOLECYSTECTOMY  11/22/2012   CHOLECYSTECTOMY  11/22/2012   Procedure: LAPAROSCOPIC CHOLECYSTECTOMY;  Surgeon: Gwenyth Ober, MD;  Location: [facility];  Service: General;;   COLONOSCOPY     COLONOSCOPY WITH PROPOFOL N/A 10/09/2019   Procedure: COLONOSCOPY WITH PROPOFOL;   Surgeon: Ronnette Juniper, MD;  Location: [facility];  Service: Gastroenterology;  Laterality: N/A;   ESOPHAGOGASTRODUODENOSCOPY (EGD) WITH PROPOFOL N/A 10/09/2019   Procedure: ESOPHAGOGASTRODUODENOSCOPY (EGD) WITH PROPOFOL;  Surgeon: Ronnette Juniper, MD;  Location: [facility];  Service: Gastroenterology;  Laterality: N/A;   POLYPECTOMY  10/09/2019   Procedure: POLYPECTOMY;  Surgeon: Ronnette Juniper, MD;  Location: [facility];  Service: Gastroenterology;;    SOCIAL HISTORY: Social History   Socioeconomic History   Marital status: Married    Spouse name: Not on file   Number of children: Not on file   Years of education: Not on file   Highest education level: Not on file  Occupational History   Not on file  Tobacco Use   Smoking status: Former    Packs/day: 1.00    Years: 31.00    Total pack years: 31.00    Types: Cigarettes    Quit date: 04/12/1998    Years since quitting: 23.4    Passive exposure: Never   Smokeless tobacco: Never  Vaping Use   Vaping Use: Never used  Substance and Sexual Activity   Alcohol use: No   Drug use: No   Sexual activity: Not on file  Other Topics Concern   Not on file  Social History Narrative   Not on file   Social Determinants of Health   Financial Resource Strain:  Not on file  Food Insecurity: Not on file  Transportation Needs: Not on file  Physical Activity: Not on file  Stress: Not on file  Social Connections: Not on file  Intimate Partner Violence: Not on file    FAMILY HISTORY: Family History  Problem Relation Age of Onset   Hypertension Mother    Diabetes Mother    Multiple myeloma Mother    Hypertension Father    Diabetes Father    Healthy Son    Healthy Daughter     ALLERGIES:  is allergic to atorvastatin, simvastatin, other, and statins.  MEDICATIONS:  Current Outpatient Medications  Medication Sig Dispense Refill   acyclovir (ZOVIRAX) 400 MG tablet Take 1 tablet (400 mg total) by mouth 2 (two) times daily. 60 tablet 5    aspirin EC 81 MG tablet Take 1 tablet (81 mg total) by mouth daily. 100 tablet 3   doxycycline (VIBRA-TABS) 100 MG tablet Take 1 tablet (100 mg total) by mouth 2 (two) times daily. 10 tablet 0   ferrous sulfate 325 (65 FE) MG EC tablet Take 325 mg by mouth daily with breakfast.     finasteride (PROSCAR) 5 MG tablet Take 5 mg by mouth daily.     folic acid (FOLVITE) 1 MG tablet Take 2 tablets (2 mg total) by mouth daily. 180 tablet 3   Gauze Pads & Dressings 2"X2" PADS Applied daily with dressing changes. 21 each 0   lenalidomide (REVLIMID) 25 MG capsule Take 1 capsule (25 mg total) by mouth daily. Take for 14 days on, 7 days off, repeat every 21 days. [employer] Auth # ; Date Obtained: 14 capsule 0   montelukast (SINGULAIR) 10 MG tablet Take 1 tablet (10 mg total) by mouth See admin instructions. Singualair 1 tab on the day prior to daratumumab treatments, 1 tab daily for 2 days after treatment. Daratumumab treatment are once a week 30 tablet 0   ondansetron (ZOFRAN) 8 MG tablet Take 1 tablet (8 mg total) by mouth 2 (two) times daily as needed (Nausea or vomiting). 30 tablet 1   prochlorperazine (COMPAZINE) 10 MG tablet Take 1 tablet (10 mg total) by mouth every 6 (six) hours as needed (Nausea or vomiting). 30 tablet 1   tamsulosin (FLOMAX) 0.4 MG CAPS capsule Take 0.4 mg by mouth daily after supper.     Adalimumab (HUMIRA PEN) 40 MG/0.4ML PNKT Inject 40 mg into the skin every 14 (fourteen) days. (Patient not taking: Reported on 08/24/2021) 2 each 2   methotrexate 2.5 MG tablet TAKE 8 TABLETS BY MOUTH ONCE WEEKLY *CAUTION: CHEMOTHERAPY. PROTECTION FROM LIGHT (Patient not taking: Reported on 08/24/2021) 96 tablet 0   mupirocin cream (BACTROBAN) 2 % Apply 1 Application topically 2 (two) times daily. 30 g 0   No current facility-administered medications for this visit.     PHYSICAL EXAMINATION: ECOG PERFORMANCE STATUS: 1 - Symptomatic but completely ambulatory Vitals:   09/21/21 0916  BP: (!) 167/90   Pulse: 80  Temp: (!) 96.7 F (35.9 C)   Filed Weights   09/21/21 0916  Weight: (!) 318 lb (144.2 kg)    Physical Exam Constitutional:      General: He is not in acute distress.    Appearance: He is obese.     Comments: Patient walks with a cane.  HENT:     Head: Normocephalic and atraumatic.  Eyes:     General: No scleral icterus. Cardiovascular:     Rate and Rhythm: Normal rate and regular rhythm.  Heart sounds: Normal heart sounds.  Pulmonary:     Effort: Pulmonary effort is normal. No respiratory distress.     Breath sounds: No wheezing.  Abdominal:     General: Bowel sounds are normal. There is no distension.     Palpations: Abdomen is soft.  Musculoskeletal:        General: No deformity. Normal range of motion.     Cervical back: Normal range of motion and neck supple.     Comments: Trace edema, bilateral low extremities.   Skin:    General: Skin is warm and dry.     Findings: No erythema or rash.  Neurological:     Mental Status: He is alert and oriented to person, place, and time. Mental status is at baseline.     Cranial Nerves: No cranial nerve deficit.     Coordination: Coordination normal.  Psychiatric:        Mood and Affect: Mood normal.      LABORATORY DATA:  I have reviewed the data as listed Lab Results  Component Value Date   WBC 8.7 09/21/2021   HGB 11.5 (L) 09/21/2021   HCT 36.1 (L) 09/21/2021   MCV 96.3 09/21/2021   PLT 284 09/21/2021   Recent Labs    09/02/21 0830 09/09/21 0756 09/21/21 0744  NA 136 136 135  K 4.1 4.0 3.9  CL 102 106 107  CO2 '26 23 23  ' GLUCOSE 101* 99 104*  BUN '15 17 16  ' CREATININE 1.19 1.05 0.95  CALCIUM 8.8* 8.5* 8.6*  GFRNONAA >60 >60 >60  PROT 7.5 7.1 7.0  ALBUMIN 3.5 3.4* 3.4*  AST 16 15 14*  ALT '7 8 7  ' ALKPHOS 69 68 83  BILITOT 0.2* 0.3 0.3    Iron/TIBC/Ferritin/ %Sat    Component Value Date/Time   IRON 78 04/05/2021 1008   TIBC 274 04/05/2021 1008   FERRITIN 425 (H) 04/05/2021 1008    IRONPCTSAT 28 04/05/2021 1008      RADIOGRAPHIC STUDIES: I have personally reviewed the radiological images as listed and agreed with the findings in the report.  NM PET Image Restage (PS) Whole Body  Result Date: 08/15/2021 CLINICAL DATA:  Subsequent treatment strategy for multiple myeloma. EXAM: NUCLEAR MEDICINE PET WHOLE BODY TECHNIQUE: 15.7 mCi F-18 FDG was injected intravenously. Full-ring PET imaging was performed from the head to foot after the radiotracer. CT data was obtained and used for attenuation correction and anatomic localization. Fasting blood glucose: 89 mg/dl COMPARISON:  None Available. FINDINGS: Mediastinal blood pool activity: SUV max 2.8 HEAD/NECK: No hypermetabolic activity in the scalp. No hypermetabolic cervical lymph nodes. Incidental CT findings: none CHEST: No hypermetabolic mediastinal or hilar nodes. No suspicious pulmonary nodules on the CT scan. Incidental CT findings: none ABDOMEN/PELVIS: No abnormal hypermetabolic activity within the liver, pancreas, adrenal glands, or spleen. No hypermetabolic lymph nodes in the abdomen or pelvis. Incidental CT findings: none SKELETON: No focal hypermetabolic activity to suggest skeletal metastasis. Incidental CT findings: No lytic lesions on CT portion exam. EXTREMITIES: No abnormal hypermetabolic activity in the lower extremities. Incidental CT findings: No lytic lesion on CT portion exam. IMPRESSION: 1. No evidence of active myeloma on skull base to thigh FDG PET scan. 2. No soft tissue plasmacytoma. 3. No lytic or suspicious lesion on CT portion exam. Electronically Signed   By: Suzy Bouchard M.D.   On: 08/15/2021 16:13   US ARTERIAL SEGMENTAL EXERCISE (USE FOR CLAUDICATION)  Result Date: 08/02/2021 CLINICAL DATA:  Decreased pedal pulses  EXAM: NONINVASIVE PHYSIOLOGIC VASCULAR STUDY OF BILATERAL LOWER EXTREMITIES WITH AND WITHOUT EXERCISE TECHNIQUE: Evaluation of both lower extremities were performed at rest, including calculation  of ankle-brachial indices, multiple segmental pressure evaluation, segmental Doppler and segmental pulse volume recording. Ankle brachial indices were also obtained following treadmill exercise. FINDINGS: Right Lower Extremity Resting ABI:  1.53 Resting TBI: 1.100 Post Exercise ABI:1.08 Segmental Pressures: Normal segmental pressures, no significant (20 mmHg) pressure gradient between adjacent segments. Great toe pressure: 137 mm Hg Arterial Waveforms: Normal multiphasic arterial waveforms. PVRs: Normal PVRs with maintained waveform amplitude, augmentation and quality. Left Lower Extremity: Resting ABI: 1.38 Resting TBI: 0.82 Post Exercise ABI:1.06 Segmental Pressures: Normal segmental pressures, no significant (20 mmHg) pressure gradient between adjacent segments. Great toe pressure: 103 mm Hg Arterial Waveforms: Normal multiphasic arterial waveforms. PVRs: Normal PVRs with maintained waveform amplitude, augmentation and quality. Other: Symmetric upper extremity pressures. IMPRESSION: Evaluation of the right lower extremity is slightly limited due to decreased compressibility of the posterior tibial artery. However, there is no definitive evidence of significant lower extremity arterial occlusive disease. Electronically Signed   By: Miachel Roux M.D.   On: 08/02/2021 15:14   CT BONE MARROW BIOPSY & ASPIRATION  Result Date: 07/27/2021 INDICATION: Multiple myeloma EXAM: CT GUIDED BONE MARROW ASPIRATION AND CORE BIOPSY RADIATION DOSE REDUCTION: This exam was performed according to the departmental dose-optimization program which includes automated exposure control, adjustment of the mA and/or kV according to patient size and/or use of iterative reconstruction technique. MEDICATIONS: None. ANESTHESIA/SEDATION: Moderate (conscious) sedation was employed during this procedure. A total of 1.5 milligrams versed and 75 micrograms fentanyl were administered intravenously. The patient's level of consciousness and vital  signs were monitored continuously by radiology nursing throughout the procedure under my direct supervision. Total monitored sedation time: 12 minutes FLUOROSCOPY TIME:  CT dose in mGy was not provided. COMPLICATIONS: None immediate. Estimated blood loss: <5 mL PROCEDURE: Informed written consent was obtained from the patient after a thorough discussion of the procedural risks, benefits and alternatives. All questions were addressed. Maximal Sterile Barrier Technique was utilized including caps, mask, sterile gowns, sterile gloves, sterile drape, hand hygiene and skin antiseptic. A timeout was performed prior to the initiation of the procedure. The patient was positioned prone and non-contrast localization CT was performed of the pelvis to demonstrate the iliac marrow spaces. Maximal barrier sterile technique utilized including caps, mask, sterile gowns, sterile gloves, large sterile drape, hand hygiene, and chlorhexidine prep. Under sterile conditions and local anesthesia, an 11 gauge coaxial bone biopsy needle was advanced into the RIGHT iliac marrow space. Needle position was confirmed with CT imaging. Initially, bone marrow aspiration was performed. Next, the 11 gauge outer cannula was utilized to obtain a 2 iliac bone marrow core biopsy. Needle was removed. Hemostasis was obtained with compression. The patient tolerated the procedure well. Samples were prepared with the cytotechnologist. IMPRESSION: Successful CT-guided bone marrow aspiration and biopsy, as above. Michaelle Birks, MD [facility] Electronically Signed   By: Michaelle Birks M.D.   On: 07/27/2021 11:51

## 2021-09-21 NOTE — Assessment & Plan Note (Signed)
secondary to chronic disease and RA treatment.

## 2021-09-22 LAB — KAPPA/LAMBDA LIGHT CHAINS
Kappa free light chain: 17.2 mg/L (ref 3.3–19.4)
Kappa, lambda light chain ratio: 0.72 (ref 0.26–1.65)
Lambda free light chains: 24 mg/L (ref 5.7–26.3)

## 2021-09-26 LAB — MULTIPLE MYELOMA PANEL, SERUM
Albumin SerPl Elph-Mcnc: 3.5 g/dL (ref 2.9–4.4)
Albumin/Glob SerPl: 1.3 (ref 0.7–1.7)
Alpha 1: 0.2 g/dL (ref 0.0–0.4)
Alpha2 Glob SerPl Elph-Mcnc: 0.7 g/dL (ref 0.4–1.0)
B-Globulin SerPl Elph-Mcnc: 1.4 g/dL — ABNORMAL HIGH (ref 0.7–1.3)
Gamma Glob SerPl Elph-Mcnc: 0.5 g/dL (ref 0.4–1.8)
Globulin, Total: 2.8 g/dL (ref 2.2–3.9)
IgA: 586 mg/dL — ABNORMAL HIGH (ref 61–437)
IgG (Immunoglobin G), Serum: 733 mg/dL (ref 603–1613)
IgM (Immunoglobulin M), Srm: 75 mg/dL (ref 20–172)
M Protein SerPl Elph-Mcnc: 0.3 g/dL — ABNORMAL HIGH
Total Protein ELP: 6.3 g/dL (ref 6.0–8.5)

## 2021-09-28 ENCOUNTER — Telehealth: Payer: Self-pay

## 2021-09-28 ENCOUNTER — Other Ambulatory Visit: Payer: Self-pay | Admitting: *Deleted

## 2021-09-28 ENCOUNTER — Ambulatory Visit: Payer: Medicare Other | Admitting: Oncology

## 2021-09-28 ENCOUNTER — Inpatient Hospital Stay: Payer: Medicare Other

## 2021-09-28 ENCOUNTER — Encounter: Payer: Self-pay | Admitting: Oncology

## 2021-09-28 VITALS — BP 119/67 | HR 88 | Temp 96.2°F | Resp 18 | Wt 316.2 lb

## 2021-09-28 DIAGNOSIS — C9000 Multiple myeloma not having achieved remission: Secondary | ICD-10-CM

## 2021-09-28 DIAGNOSIS — M069 Rheumatoid arthritis, unspecified: Secondary | ICD-10-CM | POA: Diagnosis not present

## 2021-09-28 DIAGNOSIS — Z5112 Encounter for antineoplastic immunotherapy: Secondary | ICD-10-CM | POA: Diagnosis not present

## 2021-09-28 DIAGNOSIS — Z7961 Long term (current) use of immunomodulator: Secondary | ICD-10-CM | POA: Diagnosis not present

## 2021-09-28 DIAGNOSIS — Z87891 Personal history of nicotine dependence: Secondary | ICD-10-CM | POA: Diagnosis not present

## 2021-09-28 DIAGNOSIS — Z7982 Long term (current) use of aspirin: Secondary | ICD-10-CM | POA: Diagnosis not present

## 2021-09-28 DIAGNOSIS — D649 Anemia, unspecified: Secondary | ICD-10-CM | POA: Diagnosis not present

## 2021-09-28 DIAGNOSIS — G629 Polyneuropathy, unspecified: Secondary | ICD-10-CM | POA: Diagnosis not present

## 2021-09-28 DIAGNOSIS — Z79899 Other long term (current) drug therapy: Secondary | ICD-10-CM | POA: Diagnosis not present

## 2021-09-28 LAB — COMPREHENSIVE METABOLIC PANEL
ALT: 8 U/L (ref 0–44)
AST: 14 U/L — ABNORMAL LOW (ref 15–41)
Albumin: 3.5 g/dL (ref 3.5–5.0)
Alkaline Phosphatase: 78 U/L (ref 38–126)
Anion gap: 8 (ref 5–15)
BUN: 14 mg/dL (ref 8–23)
CO2: 23 mmol/L (ref 22–32)
Calcium: 8.6 mg/dL — ABNORMAL LOW (ref 8.9–10.3)
Chloride: 103 mmol/L (ref 98–111)
Creatinine, Ser: 1.11 mg/dL (ref 0.61–1.24)
GFR, Estimated: >60 mL/min (ref >60)
Glucose, Bld: 107 mg/dL — ABNORMAL HIGH (ref 70–99)
Potassium: 3.8 mmol/L (ref 3.5–5.1)
Sodium: 134 mmol/L — ABNORMAL LOW (ref 135–145)
Total Bilirubin: 0.6 mg/dL (ref 0.3–1.2)
Total Protein: 6.9 g/dL (ref 6.5–8.1)

## 2021-09-28 LAB — CBC WITH DIFFERENTIAL/PLATELET
Abs Immature Granulocytes: 0.04 10*3/uL (ref 0.00–0.07)
Basophils Absolute: 0 10*3/uL (ref 0.0–0.1)
Basophils Relative: 0 %
Eosinophils Absolute: 0.2 10*3/uL (ref 0.0–0.5)
Eosinophils Relative: 2 %
HCT: 35.5 % — ABNORMAL LOW (ref 39.0–52.0)
Hemoglobin: 11.4 g/dL — ABNORMAL LOW (ref 13.0–17.0)
Immature Granulocytes: 0 %
Lymphocytes Relative: 13 %
Lymphs Abs: 1.3 10*3/uL (ref 0.7–4.0)
MCH: 30.4 pg (ref 26.0–34.0)
MCHC: 32.1 g/dL (ref 30.0–36.0)
MCV: 94.7 fL (ref 80.0–100.0)
Monocytes Absolute: 0.6 10*3/uL (ref 0.1–1.0)
Monocytes Relative: 6 %
Neutro Abs: 8 10*3/uL — ABNORMAL HIGH (ref 1.7–7.7)
Neutrophils Relative %: 79 %
Platelets: 280 10*3/uL (ref 150–400)
RBC: 3.75 MIL/uL — ABNORMAL LOW (ref 4.22–5.81)
RDW: 15.7 % — ABNORMAL HIGH (ref 11.5–15.5)
WBC: 10.2 10*3/uL (ref 4.0–10.5)
nRBC: 0 % (ref 0.0–0.2)

## 2021-09-28 MED ORDER — LENALIDOMIDE 25 MG PO CAPS: 25.0000 mg | ORAL_CAPSULE | Freq: Every day | ORAL | 0 refills | Status: DC

## 2021-09-28 MED ORDER — DEXAMETHASONE 4 MG PO TABS
20.0000 mg | ORAL_TABLET | Freq: Once | ORAL | Status: AC
  Administered 2021-09-28: 20 mg via ORAL
  Filled 2021-09-28: qty 5

## 2021-09-28 MED ORDER — BORTEZOMIB CHEMO SQ INJECTION 3.5 MG (2.5MG/ML)
1.3000 mg/m2 | Freq: Once | INTRAMUSCULAR | Status: AC
  Administered 2021-09-28: 3.5 mg via SUBCUTANEOUS
  Filled 2021-09-28: qty 1.4

## 2021-09-28 MED ORDER — ACETAMINOPHEN 325 MG PO TABS
650.0000 mg | ORAL_TABLET | Freq: Once | ORAL | Status: AC
  Administered 2021-09-28: 650 mg via ORAL
  Filled 2021-09-28: qty 2

## 2021-09-28 MED ORDER — DIPHENHYDRAMINE HCL 25 MG PO CAPS
50.0000 mg | ORAL_CAPSULE | Freq: Once | ORAL | Status: AC
  Administered 2021-09-28: 50 mg via ORAL
  Filled 2021-09-28: qty 2

## 2021-09-28 MED ORDER — DARATUMUMAB-HYALURONIDASE-FIHJ 1800-30000 MG-UT/15ML ~~LOC~~ SOLN
1800.0000 mg | Freq: Once | SUBCUTANEOUS | Status: AC
  Administered 2021-09-28: 1800 mg via SUBCUTANEOUS
  Filled 2021-09-28: qty 15

## 2021-09-28 NOTE — Addendum Note (Signed)
Addended by: Earlie Server on: 09/28/2021 12:26 PM   Modules accepted: Orders

## 2021-09-28 NOTE — Patient Instructions (Signed)
[facility] AT [place]  Discharge Instructions: Thank you for choosing [facility] to provide your oncology and hematology care.  If you have a lab appointment with the [facility], please go directly to the [facility] and check in at the registration area.  Wear comfortable clothing and clothing appropriate for easy access to any Portacath or PICC line.   We strive to give you quality time with your provider. You may need to reschedule your appointment if you arrive late (15 or more minutes).  Arriving late affects you and other patients whose appointments are after yours.  Also, if you miss three or more appointments without notifying the office, you may be dismissed from the clinic at the provider's discretion.      For prescription refill requests, have your pharmacy contact our office and allow 72 hours for refills to be completed.    Today you received the following chemotherapy and/or immunotherapy agents: VELCADE/   daratumumab   To help prevent nausea and vomiting after your treatment, we encourage you to take your nausea medication as directed.  BELOW ARE SYMPTOMS THAT SHOULD BE REPORTED IMMEDIATELY: *FEVER GREATER THAN 100.4 F (38 C) OR HIGHER *CHILLS OR SWEATING *NAUSEA AND VOMITING THAT IS NOT CONTROLLED WITH YOUR NAUSEA MEDICATION *UNUSUAL SHORTNESS OF BREATH *UNUSUAL BRUISING OR BLEEDING *URINARY PROBLEMS (pain or burning when urinating, or frequent urination) *BOWEL PROBLEMS (unusual diarrhea, constipation, pain near the anus) TENDERNESS IN MOUTH AND THROAT WITH OR WITHOUT PRESENCE OF ULCERS (sore throat, sores in mouth, or a toothache) UNUSUAL RASH, SWELLING OR PAIN  UNUSUAL VAGINAL DISCHARGE OR ITCHING   Items with * indicate a potential emergency and should be followed up as soon as possible or go to the Emergency Department if any problems should occur.  Please show the CHEMOTHERAPY ALERT CARD or IMMUNOTHERAPY ALERT CARD at  check-in to the Emergency Department and triage nurse.  Should you have questions after your visit or need to cancel or reschedule your appointment, please contact [facility] [place] AT [place]  [phone] and follow the prompts.  Office hours are 8:00 a.m. to 4:30 p.m. Monday - Friday. Please note that voicemails left after 4:00 p.m. may not be returned until the following business day.  We are closed weekends and major holidays. You have access to a nurse at all times for urgent questions. Please call the main number to the clinic [phone] and follow the prompts.  For any non-urgent questions, you may also contact your provider using MyChart. We now offer e-Visits for anyone 32 and older to request care online for non-urgent symptoms. For details visit [email or web address].   Also download the MyChart app! Go to the app store, search "MyChart", open the app, select [facility], and log in with your MyChart username and password.  Masks are optional in the cancer centers. If you would like for your care team to wear a mask while they are taking care of you, please let them know. For doctor visits, patients may have with them one support person who is at least 69 years old. At this time, visitors are not allowed in the infusion area.

## 2021-09-28 NOTE — Telephone Encounter (Signed)
patient in clinic today asking if he can go back to Wednesday tx days. Informed him that his appt was moved to Thursday due accomodations being made due to holiday and treatment has to be 1 week apart. Pt verbalized understanding, but would like to go back to Wednedsays and is ok with r/s the appt on 7/12 to the following week. Informed him that it was not recommneded, but he wants to strongly go back to Wednesday we would r/s to 7/19. Appt was r/s to 7/19 and Alyson will make [facility] calendar for pt.

## 2021-09-28 NOTE — Progress Notes (Signed)
Patient tolerated Dara/ Velcade injection today, no questions/concerns voiced. Stable at discharge. AVS given.

## 2021-09-28 NOTE — Telephone Encounter (Signed)
Today following chemo injection, patient had issue with next infusion appointments stating it has changed, it is normally on Wednesday and this will interfere with his [facility] schedule. Patient explained regarding chair availably. Still concerns. Pharmacist and MD team notified. Will f/u with patient. Patient aware

## 2021-09-30 ENCOUNTER — Ambulatory Visit: Payer: Medicare Other | Admitting: Oncology

## 2021-09-30 ENCOUNTER — Other Ambulatory Visit: Payer: Medicare Other

## 2021-09-30 ENCOUNTER — Ambulatory Visit: Payer: Medicare Other

## 2021-10-06 ENCOUNTER — Inpatient Hospital Stay: Payer: Medicare Other

## 2021-10-06 ENCOUNTER — Ambulatory Visit
Admission: EM | Admit: 2021-10-06 | Discharge: 2021-10-06 | Disposition: A | Discharge status: Home / Self Care | Payer: Medicare Other

## 2021-10-06 ENCOUNTER — Inpatient Hospital Stay: Payer: Medicare Other | Attending: Oncology

## 2021-10-06 ENCOUNTER — Inpatient Hospital Stay (HOSPITAL_BASED_OUTPATIENT_CLINIC_OR_DEPARTMENT_OTHER): Payer: Medicare Other | Admitting: Medical Oncology

## 2021-10-06 DIAGNOSIS — T451X5A Adverse effect of antineoplastic and immunosuppressive drugs, initial encounter: Secondary | ICD-10-CM | POA: Insufficient documentation

## 2021-10-06 DIAGNOSIS — Z79899 Other long term (current) drug therapy: Secondary | ICD-10-CM | POA: Diagnosis not present

## 2021-10-06 DIAGNOSIS — D84821 Immunodeficiency due to drugs: Secondary | ICD-10-CM | POA: Diagnosis not present

## 2021-10-06 DIAGNOSIS — Z79624 Long term (current) use of inhibitors of nucleotide synthesis: Secondary | ICD-10-CM | POA: Insufficient documentation

## 2021-10-06 DIAGNOSIS — C9000 Multiple myeloma not having achieved remission: Secondary | ICD-10-CM

## 2021-10-06 DIAGNOSIS — D6481 Anemia due to antineoplastic chemotherapy: Secondary | ICD-10-CM | POA: Diagnosis not present

## 2021-10-06 DIAGNOSIS — L02414 Cutaneous abscess of left upper limb: Secondary | ICD-10-CM

## 2021-10-06 DIAGNOSIS — L0291 Cutaneous abscess, unspecified: Secondary | ICD-10-CM | POA: Diagnosis not present

## 2021-10-06 DIAGNOSIS — Z7982 Long term (current) use of aspirin: Secondary | ICD-10-CM | POA: Diagnosis not present

## 2021-10-06 DIAGNOSIS — Z5112 Encounter for antineoplastic immunotherapy: Secondary | ICD-10-CM | POA: Diagnosis not present

## 2021-10-06 DIAGNOSIS — Z7961 Long term (current) use of immunomodulator: Secondary | ICD-10-CM | POA: Insufficient documentation

## 2021-10-06 DIAGNOSIS — M069 Rheumatoid arthritis, unspecified: Secondary | ICD-10-CM | POA: Insufficient documentation

## 2021-10-06 DIAGNOSIS — D849 Immunodeficiency, unspecified: Secondary | ICD-10-CM | POA: Diagnosis not present

## 2021-10-06 LAB — CBC WITH DIFFERENTIAL/PLATELET
Abs Immature Granulocytes: 0.03 10*3/uL (ref 0.00–0.07)
Basophils Absolute: 0 10*3/uL (ref 0.0–0.1)
Basophils Relative: 0 %
Eosinophils Absolute: 0.4 10*3/uL (ref 0.0–0.5)
Eosinophils Relative: 5 %
HCT: 34.8 % — ABNORMAL LOW (ref 39.0–52.0)
Hemoglobin: 11.3 g/dL — ABNORMAL LOW (ref 13.0–17.0)
Immature Granulocytes: 0 %
Lymphocytes Relative: 20 %
Lymphs Abs: 1.6 10*3/uL (ref 0.7–4.0)
MCH: 30.8 pg (ref 26.0–34.0)
MCHC: 32.5 g/dL (ref 30.0–36.0)
MCV: 94.8 fL (ref 80.0–100.0)
Monocytes Absolute: 1.2 10*3/uL — ABNORMAL HIGH (ref 0.1–1.0)
Monocytes Relative: 15 %
Neutro Abs: 4.9 10*3/uL (ref 1.7–7.7)
Neutrophils Relative %: 60 %
Platelets: 251 10*3/uL (ref 150–400)
RBC: 3.67 MIL/uL — ABNORMAL LOW (ref 4.22–5.81)
RDW: 15.6 % — ABNORMAL HIGH (ref 11.5–15.5)
WBC: 8.2 10*3/uL (ref 4.0–10.5)
nRBC: 0 % (ref 0.0–0.2)

## 2021-10-06 LAB — COMPREHENSIVE METABOLIC PANEL
ALT: 7 U/L (ref 0–44)
AST: 17 U/L (ref 15–41)
Albumin: 3.4 g/dL — ABNORMAL LOW (ref 3.5–5.0)
Alkaline Phosphatase: 70 U/L (ref 38–126)
Anion gap: 10 (ref 5–15)
BUN: 14 mg/dL (ref 8–23)
CO2: 22 mmol/L (ref 22–32)
Calcium: 8.7 mg/dL — ABNORMAL LOW (ref 8.9–10.3)
Chloride: 105 mmol/L (ref 98–111)
Creatinine, Ser: 0.96 mg/dL (ref 0.61–1.24)
GFR, Estimated: >60 mL/min (ref >60)
Glucose, Bld: 97 mg/dL (ref 70–99)
Potassium: 3.9 mmol/L (ref 3.5–5.1)
Sodium: 137 mmol/L (ref 135–145)
Total Bilirubin: 0.5 mg/dL (ref 0.3–1.2)
Total Protein: 6.5 g/dL (ref 6.5–8.1)

## 2021-10-06 MED ORDER — AMOXICILLIN-POT CLAVULANATE 875-125 MG PO TABS: 1.0000 | ORAL_TABLET | Freq: Two times a day (BID) | ORAL | 0 refills | Status: AC

## 2021-10-06 NOTE — Progress Notes (Signed)
Symptom Management [facility] at [facility] [phone] [phone]  Patient Care Team: Seward Carol, MD as PCP - General (Internal Medicine)   Name of the patient: Eric Cruz  [id number]  Dec 16, 1950   Date of visit: 10/06/21  Reason for Consult: Eric Cruz is a 71 y.o. male who presents today for:  Wound: Patient presents for his chemotherapy appointment but does mention that the skin abscess of his left arm has not healed fully. Still having some swelling, pain, and purulent yellow/white discharge. No fevers, vomiting or increased size. He finished his last dose of doxycyline today. No missed doses.    PAST MEDICAL HISTORY: Past Medical History:  Diagnosis Date   Arthritis    BPH (benign prostatic hyperplasia)    Cancer (HCC)    Prostate: states he had a positive biopsy but had another one a year later and it was gone.    Depression    Dizziness    GERD (gastroesophageal reflux disease)    Gout    High cholesterol    Hypertension    Sleep apnea    has cpap - not currently wearing   Varicose veins     PAST SURGICAL HISTORY:  Past Surgical History:  Procedure Laterality Date   ANTERIOR CERVICAL DECOMP/DISCECTOMY FUSION N/A 02/21/2018   Procedure: Cervical Five-Six Cervical Six-Seven Anterior cervical decompression/discectomy/fusion;  Surgeon: Ashok Pall, MD;  Location: [place];  Service: Neurosurgery;  Laterality: N/A;  Cervical Five-Six Cervical Six-Seven Anterior cervical decompression/discectomy/fusion   BIOPSY  10/09/2019   Procedure: BIOPSY;  Surgeon: Ronnette Juniper, MD;  [facility]: [facility];  Service: Gastroenterology;;   BLADDER SURGERY     CARPAL TUNNEL RELEASE Right 02/11/2019   Procedure: Right Carpal Tunnel Wound Irrigation;  Surgeon: Ashok Pall, MD;  Location: [facility];  Service: Neurosurgery;  Laterality: Right;  Right Carpal tunnel wound exploration/wash out   CARPAL TUNNEL RELEASE Bilateral     CHOLECYSTECTOMY  11/22/2012   CHOLECYSTECTOMY  11/22/2012   Procedure: LAPAROSCOPIC CHOLECYSTECTOMY;  Surgeon: Gwenyth Ober, MD;  Location: [place];  Service: General;;   COLONOSCOPY     COLONOSCOPY WITH PROPOFOL N/A 10/09/2019   Procedure: COLONOSCOPY WITH PROPOFOL;  Surgeon: Ronnette Juniper, MD;  [facility];  Service: Gastroenterology;  Laterality: N/A;   ESOPHAGOGASTRODUODENOSCOPY (EGD) WITH PROPOFOL N/A 10/09/2019   Procedure: ESOPHAGOGASTRODUODENOSCOPY (EGD) WITH PROPOFOL;  Surgeon: Ronnette Juniper, MD;  [facility];  Service: Gastroenterology;  Laterality: N/A;   POLYPECTOMY  10/09/2019   Procedure: POLYPECTOMY;  Surgeon: Ronnette Juniper, MD;  [facility];  Service: Gastroenterology;;    HEMATOLOGY/ONCOLOGY HISTORY:  Oncology History  Multiple myeloma not having achieved remission ([place])  04/22/2020 Initial Diagnosis   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  27% plasma cell, cytogenetics - normal, and myeloma FISH panel is negative.  Standard risk.  Congo red staining was added and was negative.  - 07/27/2021, bone marrow biopsy showed hypercellular marrow involved by plasma cell myeloma, CD138 immunohistochemistry plasma cells compromise approximately 70% of the cellular elements and are lambda restricted by light chain in situ hybridization.  Cytogenetics showed duplication of 1q.  Cytogenetics is normal.   04/22/2020 Cancer Staging   Staging form: Plasma Cell Myeloma and Plasma Cell Disorders, AJCC 8th Edition - Clinical stage from 04/22/2020: RISS Stage II (Beta-2-microglobulin (mg/L): 2.7, Albumin (g/dL): 3.1, ISS: Stage II, High-risk cytogenetics: Absent, LDH: Normal) - Signed by  Earlie Server, MD on 08/10/2021 Stage prefix: Initial diagnosis Beta 2 microglobulin range (mg/L): Less than 3.5 Albumin range (g/dL): Less than 3.5 Cytogenetics: 1q addition Lactate dehydrogenase (LDH)  (U/L): 136 Serum calcium level: Normal Serum creatinine level: Normal   08/12/2021 Imaging   PET scan showed 1. No evidence of active myeloma on skull base to thigh FDG PET scan. 2. No soft tissue plasmacytoma.3. No lytic or suspicious lesion on CT portion exam.     08/24/2021 -  Chemotherapy   Patient is on Treatment Plan : MYELOMA NEWLY DIAGNOSED TRANSPLANT CANDIDATE DaraVRd (Daratumumab SQ) q21d x 6 Cycles (Induction/Consolidation)       ALLERGIES:  is allergic to atorvastatin, simvastatin, other, and statins.  MEDICATIONS:  Current Outpatient Medications  Medication Sig Dispense Refill   amoxicillin-clavulanate (AUGMENTIN) 875-125 MG tablet Take 1 tablet by mouth 2 (two) times daily for 10 days. 20 tablet 0   acyclovir (ZOVIRAX) 400 MG tablet Take 1 tablet (400 mg total) by mouth 2 (two) times daily. 60 tablet 5   Adalimumab (HUMIRA PEN) 40 MG/0.4ML PNKT Inject 40 mg into the skin every 14 (fourteen) days. (Patient not taking: Reported on 08/24/2021) 2 each 2   aspirin EC 81 MG tablet Take 1 tablet (81 mg total) by mouth daily. 100 tablet 3   ferrous sulfate 325 (65 FE) MG EC tablet Take 325 mg by mouth daily with breakfast.     finasteride (PROSCAR) 5 MG tablet Take 5 mg by mouth daily.     folic acid (FOLVITE) 1 MG tablet Take 2 tablets (2 mg total) by mouth daily. 180 tablet 3   Gauze Pads & Dressings 2"X2" PADS Applied daily with dressing changes. 21 each 0   lenalidomide (REVLIMID) 25 MG capsule Take 1 capsule (25 mg total) by mouth daily. Take for 14 days on, 7 days off, repeat every 21 days. [employer] Auth # ; Date Obtained: 14 capsule 0   methotrexate 2.5 MG tablet TAKE 8 TABLETS BY MOUTH ONCE WEEKLY *CAUTION: CHEMOTHERAPY. PROTECTION FROM LIGHT (Patient not taking: Reported on 08/24/2021) 96 tablet 0   montelukast (SINGULAIR) 10 MG tablet Take 1 tablet (10 mg total) by mouth See admin instructions. Singualair 1 tab on the day prior to daratumumab treatments, 1 tab daily for 2  days after treatment. Daratumumab treatment are once a week 30 tablet 0   mupirocin cream (BACTROBAN) 2 % Apply 1 Application topically 2 (two) times daily. 30 g 0   ondansetron (ZOFRAN) 8 MG tablet Take 1 tablet (8 mg total) by mouth 2 (two) times daily as needed (Nausea or vomiting). 30 tablet 1   prochlorperazine (COMPAZINE) 10 MG tablet Take 1 tablet (10 mg total) by mouth every 6 (six) hours as needed (Nausea or vomiting). 30 tablet 1   tamsulosin (FLOMAX) 0.4 MG CAPS capsule Take 0.4 mg by mouth daily after supper.     No current facility-administered medications for this visit.    VITAL SIGNS: There were no vitals taken for this visit. There were no vitals filed for this visit.  Estimated body mass index is 48.61 kg/m as calculated from the following:   Height as of 09/09/21: '5\' 8"'  (1.727 m).   Weight as of an earlier encounter on 10/06/21: 319 lb 10.7 oz (145 kg).  LABS: CBC:    Component Value Date/Time   WBC 8.2 10/06/2021 0803   HGB 11.3 (L) 10/06/2021 0803   HCT 34.8 (L) 10/06/2021 0803   PLT 251 10/06/2021 0803  MCV 94.8 10/06/2021 0803   NEUTROABS 4.9 10/06/2021 0803   LYMPHSABS 1.6 10/06/2021 0803   MONOABS 1.2 (H) 10/06/2021 0803   EOSABS 0.4 10/06/2021 0803   BASOSABS 0.0 10/06/2021 0803   Comprehensive Metabolic Panel:    Component Value Date/Time   NA 137 10/06/2021 0803   K 3.9 10/06/2021 0803   CL 105 10/06/2021 0803   CO2 22 10/06/2021 0803   BUN 14 10/06/2021 0803   CREATININE 0.96 10/06/2021 0803   CREATININE 1.07 03/14/2021 1218   GLUCOSE 97 10/06/2021 0803   CALCIUM 8.7 (L) 10/06/2021 0803   AST 17 10/06/2021 0803   ALT 7 10/06/2021 0803   ALKPHOS 70 10/06/2021 0803   BILITOT 0.5 10/06/2021 0803   PROT 6.5 10/06/2021 0803   ALBUMIN 3.4 (L) 10/06/2021 0803    RADIOGRAPHIC STUDIES: No results found.  PERFORMANCE STATUS (ECOG) : 1 - Symptomatic but completely ambulatory  Review of Systems Unless otherwise noted, a complete review of  systems is negative.  Physical Exam General: NAD Cardiovascular: regular rate and rhythm Pulmonary: clear ant fields GU: no suprapubic tenderness Extremities:No joint deformities. ROM Left elbow intact  Skin: See below  Neurological: Weakness but otherwise nonfocal  Assessment and Plan- Patient is a 71 y.o. male    Encounter Diagnoses  Name Primary?   Cutaneous abscess of left upper extremity Yes   Immunocompromised state ([place])    Multiple myeloma not having achieved remission ([place])    New to me. Failed doxycyline. Unable to obtain wound culture due to supply limitations. Switching to Augmentin (avoiding Bactrim due to his history of Multiple Myeloma to avoid AKI). Discussed how to take along with common potential side effects and precautions. Holding his chemotherapy today. Discussed with Dr. Tasia Catchings who agrees. Surgical referral suggested for I&D with saline debridement- pt prefers urgent care instead. Follow up in clinic next week.      Patient expressed understanding and was in agreement with this plan. He also understands that He can call clinic at any time with any questions, concerns, or complaints.   Thank you for allowing me to participate in the care of this very pleasant patient.   Time Total: 25  Visit consisted of counseling and education dealing with the complex and emotionally intense issues of symptom management in the setting of serious illness.Greater than 50%  of this time was spent counseling and coordinating care related to the above assessment and plan.  Signed by: Nelwyn Salisbury, PA-C

## 2021-10-06 NOTE — Progress Notes (Signed)
Pt reports to clinic for Darzalex/Velcade treatment. Pt reports area on left upper forearm is still not healed after completing round of antibiotics. Nelwyn Salisbury PA to see pt per Dr. Tasia Catchings.  After assessment of pt, hold treatment and a new antibiotic to be called into patient's pharmacy per Nelwyn Salisbury PA. Sarah PA informed pt and pt agrees with plan. Pt stable at discharge and to stop by scheduling to schedule follow up appts as directed by Nelwyn Salisbury PA.

## 2021-10-06 NOTE — ED Provider Notes (Signed)
[facility]    CSN: [id number] Arrival date & time: 10/06/21  1351      History   Chief Complaint Chief Complaint  Patient presents with   Abscess    HPI Eric Cruz is a 71 y.o. male.    Abscess  Here for swelling and redness on his left forearm.  On June 21 he was treated with doxycycline for cellulitis.  He was seen again today at his oncology office and could not get his chemo done today because of the abscess.  That provider sent in Augmentin and sent him here to "have it drained and washed out"  He is undergoing treatment for multiple myeloma.  No fever or chills  Past Medical History:  Diagnosis Date   Arthritis    BPH (benign prostatic hyperplasia)    Cancer (HCC)    Prostate: states he had a positive biopsy but had another one a year later and it was gone.    Depression    Dizziness    GERD (gastroesophageal reflux disease)    Gout    High cholesterol    Hypertension    Sleep apnea    has cpap - not currently wearing   Varicose veins     Patient Active Problem List   Diagnosis Date Noted   Neuropathy 09/02/2021   Normocytic anemia 09/02/2021   Encounter for antineoplastic chemotherapy 08/24/2021   Chest pain 08/25/2020   Gastroesophageal reflux disease 08/25/2020   Goals of care, counseling/discussion 04/22/2020   Multiple myeloma not having achieved remission ([place]) 04/22/2020   Rheumatoid arthritis ([place]) 04/07/2020   High risk medication use 04/07/2020   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   Synovitis of left knee 09/16/2019   Wound infection after surgery 02/11/2019   HNP (herniated nucleus pulposus) with myelopathy, cervical 02/21/2018   Postop check 12/10/2012   Symptomatic cholelithiasis 11/15/2012    Past Surgical History:  Procedure Laterality Date   ANTERIOR CERVICAL DECOMP/DISCECTOMY FUSION N/A 02/21/2018   Procedure: Cervical Five-Six Cervical Six-Seven Anterior cervical decompression/discectomy/fusion;  Surgeon:  Ashok Pall, MD;  Location: [place];  Service: Neurosurgery;  Laterality: N/A;  Cervical Five-Six Cervical Six-Seven Anterior cervical decompression/discectomy/fusion   BIOPSY  10/09/2019   Procedure: BIOPSY;  Surgeon: Ronnette Juniper, MD;  [facility];  Service: Gastroenterology;;   BLADDER SURGERY     CARPAL TUNNEL RELEASE Right 02/11/2019   Procedure: Right Carpal Tunnel Wound Irrigation;  Surgeon: Ashok Pall, MD;  Location: [place];  Service: Neurosurgery;  Laterality: Right;  Right Carpal tunnel wound exploration/wash out   CARPAL TUNNEL RELEASE Bilateral    CHOLECYSTECTOMY  11/22/2012   CHOLECYSTECTOMY  11/22/2012   Procedure: LAPAROSCOPIC CHOLECYSTECTOMY;  Surgeon: Gwenyth Ober, MD;  Location: [place];  Service: General;;   COLONOSCOPY     COLONOSCOPY WITH PROPOFOL N/A 10/09/2019   Procedure: COLONOSCOPY WITH PROPOFOL;  Surgeon: Ronnette Juniper, MD;  [facility];  Service: Gastroenterology;  Laterality: N/A;   ESOPHAGOGASTRODUODENOSCOPY (EGD) WITH PROPOFOL N/A 10/09/2019   Procedure: ESOPHAGOGASTRODUODENOSCOPY (EGD) WITH PROPOFOL;  Surgeon: Ronnette Juniper, MD;  [facility];  Service: Gastroenterology;  Laterality: N/A;   POLYPECTOMY  10/09/2019   Procedure: POLYPECTOMY;  Surgeon: Ronnette Juniper, MD;  [facility];  Service: Gastroenterology;;       Home Medications    Prior to Admission medications   Medication Sig Start Date End Date Taking? Authorizing Provider  acyclovir (ZOVIRAX) 400 MG tablet Take 1 tablet (400 mg total) by mouth 2 (two)  times daily. 08/15/21   Earlie Server, MD  Adalimumab (HUMIRA PEN) 40 MG/0.4ML PNKT Inject 40 mg into the skin every 14 (fourteen) days. Patient not taking: Reported on 08/24/2021 05/10/21   Bo Merino, MD  amoxicillin-clavulanate (AUGMENTIN) 875-125 MG tablet Take 1 tablet by mouth 2 (two) times daily for 10 days. 10/06/21 10/16/21  Hughie Closs, PA-C  aspirin EC 81 MG tablet Take 1 tablet (81 mg total) by mouth daily.  08/15/21   Earlie Server, MD  ferrous sulfate 325 (65 FE) MG EC tablet Take 325 mg by mouth daily with breakfast.    [provider]  finasteride (PROSCAR) 5 MG tablet Take 5 mg by mouth daily.    [provider]  folic acid (FOLVITE) 1 MG tablet Take 2 tablets (2 mg total) by mouth daily. 03/14/21   Bo Merino, MD  Gauze Pads & Dressings 2"X2" PADS Applied daily with dressing changes. 10/29/20   Raspet, Erin K, PA-C  lenalidomide (REVLIMID) 25 MG capsule Take 1 capsule (25 mg total) by mouth daily. Take for 14 days on, 7 days off, repeat every 21 days. [employer] Auth # ; Date Obtained: 09/28/21   Earlie Server, MD  methotrexate 2.5 MG tablet TAKE 8 TABLETS BY MOUTH ONCE WEEKLY *CAUTION: CHEMOTHERAPY. PROTECTION FROM LIGHT Patient not taking: Reported on 08/24/2021 06/27/21   Ofilia Neas, PA-C  montelukast (SINGULAIR) 10 MG tablet Take 1 tablet (10 mg total) by mouth See admin instructions. Singualair 1 tab on the day prior to daratumumab treatments, 1 tab daily for 2 days after treatment. Daratumumab treatment are once a week 08/15/21   Earlie Server, MD  mupirocin cream (BACTROBAN) 2 % Apply 1 Application topically 2 (two) times daily. 09/21/21   Earlie Server, MD  ondansetron (ZOFRAN) 8 MG tablet Take 1 tablet (8 mg total) by mouth 2 (two) times daily as needed (Nausea or vomiting). 08/15/21   Earlie Server, MD  prochlorperazine (COMPAZINE) 10 MG tablet Take 1 tablet (10 mg total) by mouth every 6 (six) hours as needed (Nausea or vomiting). 08/15/21   Earlie Server, MD  tamsulosin (FLOMAX) 0.4 MG CAPS capsule Take 0.4 mg by mouth daily after supper.    [provider]    Family History Family History  Problem Relation Age of Onset   Hypertension Mother    Diabetes Mother    Multiple myeloma Mother    Hypertension Father    Diabetes Father    Healthy Son    Healthy Daughter     Social History Social History   Tobacco Use   Smoking status: Former    Packs/day: 1.00    Years: 31.00     Total pack years: 31.00    Types: Cigarettes    Quit date: 04/12/1998    Years since quitting: 23.5    Passive exposure: Never   Smokeless tobacco: Never  Vaping Use   Vaping Use: Never used  Substance Use Topics   Alcohol use: No   Drug use: No     Allergies   Atorvastatin, Simvastatin, Other, and Statins   Review of Systems Review of Systems   Physical Exam Triage Vital Signs ED Triage Vitals  Enc Vitals Group     BP 10/06/21 1509 137/73     Pulse Rate 10/06/21 1509 72     Resp 10/06/21 1509 18     Temp 10/06/21 1509 (!) 97.3 F (36.3 C)     Temp src --      SpO2  10/06/21 1509 95 %     Weight --      Height --      Head Circumference --      Peak Flow --      Pain Score 10/06/21 1508 3     Pain Loc --      Pain Edu? --      Excl. in [place]? --    No data found.  Updated Vital Signs BP 137/73   Pulse 72   Temp (!) 97.3 F (36.3 C)   Resp 18   SpO2 95%   Visual Acuity Right Eye Distance:   Left Eye Distance:   Bilateral Distance:    Right Eye Near:   Left Eye Near:    Bilateral Near:     Physical Exam Vitals reviewed.  Constitutional:      General: He is not in acute distress.    Appearance: He is not ill-appearing, toxic-appearing or diaphoretic.  Cardiovascular:     Rate and Rhythm: Normal rate and regular rhythm.  Musculoskeletal:     Comments: He has swelling and induration and erythema about 5 cm in diameter.  There are 2 spots that already look like they have drained a little bit.  There is some fluctuance around 1 of those areas.  Neurological:     Mental Status: He is alert and oriented to person, place, and time.  Psychiatric:        Behavior: Behavior normal.      UC Treatments / Results  Labs (all labs ordered are listed, but only abnormal results are displayed) Labs Reviewed - No data to display  EKG   Radiology No results found.  Procedures Procedures (including critical care time)  Medications Ordered in  UC Medications - No data to display  Initial Impression / Assessment and Plan / UC Course  I have reviewed the triage vital signs and the nursing notes.  Pertinent labs & imaging results that were available during my care of the patient were reviewed by me and considered in my medical decision making (see chart for details).     Since there was a tiny bit of fluctuance around that 1 drain spot, I decided to see if I&D would give him any benefit.  After verbal consent, 1% lidocaine plain was used for infiltration anesthesia.  #11 blade was used under clean conditions to make a stab wound.  No purulence came out whatsoever.  It only bled.  There was a cavity that was empty that was about 0.5 cm deep.  Since that was bleeding fairly briskly it was packed with iodoform gauze.  Pressure bandage was applied.  Estimated blood loss 3 mL.  No complications.  There were no other areas that were fluctuant and needed draining.  He is going to take the Augmentin that has been sent in.  He is going to return tomorrow for the packing to be removed. Final Clinical Impressions(s) / UC Diagnoses   Final diagnoses:  Abscess     Discharge Instructions      Take amoxicillin-clavulanate 875 mg--1 tab twice daily with food for 7 days; this is the antibiotic your other provider sent in earlier today.  Return here tomorrow for the packing to be removed     ED Prescriptions   None    PDMP not reviewed this encounter.   Barrett Henle, MD 10/06/21 [phone]

## 2021-10-06 NOTE — ED Triage Notes (Signed)
Patient presents to Urgent Care with complaints of L arm abscess since 3 weeks ago. Patient reports he is a ca pt and ca md prescribed an antibiotic that he has been on for 2 weeks and then today she prescribed another antibiotic that is stronger but wants him to get it lanced. Pt was unable to get chemo today due to this issue.

## 2021-10-06 NOTE — Discharge Instructions (Addendum)
Take amoxicillin-clavulanate 875 mg--1 tab twice daily with food for 7 days; this is the antibiotic your other provider sent in earlier today.  Return here tomorrow for the packing to be removed

## 2021-10-07 ENCOUNTER — Ambulatory Visit
Admission: EM | Admit: 2021-10-07 | Discharge: 2021-10-07 | Disposition: A | Discharge status: Home / Self Care | Payer: Medicare Other | Attending: Internal Medicine | Admitting: Internal Medicine

## 2021-10-07 VITALS — BP 135/85 | HR 80 | Temp 98.0°F | Resp 18

## 2021-10-07 DIAGNOSIS — L02414 Cutaneous abscess of left upper limb: Secondary | ICD-10-CM

## 2021-10-07 NOTE — Discharge Instructions (Signed)
Okay to wash with soap and water Daily wound dressing changes with nonadherent dressing Complete the course of antibiotics Return to urgent care if symptoms worsen.

## 2021-10-07 NOTE — ED Triage Notes (Signed)
Pt c/o left forearm abscess. States he was here yesterday and it was drained. He reports discharge instructions for him to return today.

## 2021-10-10 NOTE — ED Provider Notes (Signed)
[facility]    CSN: [id number] Arrival date & time: 10/07/21  1043      History   Chief Complaint Chief Complaint  Patient presents with   Abscess    HPI Eric Cruz is a 71 y.o. male comes to the urgent care for follow-up.  Patient had incision and drainage done yesterday.  Some packing was placed.  The packing has since fallen off.  The discharges reduced.  Pain is decreased as well.  Patient continues to take antibiotics.  He denies any fever or chills.   HPI  Past Medical History:  Diagnosis Date   Arthritis    BPH (benign prostatic hyperplasia)    Cancer ([place])    Prostate: states he had a positive biopsy but had another one a year later and it was gone.    Depression    Dizziness    GERD (gastroesophageal reflux disease)    Gout    High cholesterol    Hypertension    Sleep apnea    has cpap - not currently wearing   Varicose veins     Patient Active Problem List   Diagnosis Date Noted   Neuropathy 09/02/2021   Normocytic anemia 09/02/2021   Encounter for antineoplastic chemotherapy 08/24/2021   Chest pain 08/25/2020   Gastroesophageal reflux disease 08/25/2020   Goals of care, counseling/discussion 04/22/2020   Multiple myeloma not having achieved remission ([place]) 04/22/2020   Rheumatoid arthritis ([place]) 04/07/2020   High risk medication use 04/07/2020   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   Synovitis of left knee 09/16/2019   Wound infection after surgery 02/11/2019   HNP (herniated nucleus pulposus) with myelopathy, cervical 02/21/2018   Postop check 12/10/2012   Symptomatic cholelithiasis 11/15/2012    Past Surgical History:  Procedure Laterality Date   ANTERIOR CERVICAL DECOMP/DISCECTOMY FUSION N/A 02/21/2018   Procedure: Cervical Five-Six Cervical Six-Seven Anterior cervical decompression/discectomy/fusion;  Surgeon: Ashok Pall, MD;  Location: [place];  Service: Neurosurgery;  Laterality: N/A;  Cervical Five-Six Cervical  Six-Seven Anterior cervical decompression/discectomy/fusion   BIOPSY  10/09/2019   Procedure: BIOPSY;  Surgeon: Ronnette Juniper, MD;  [facility];  Service: Gastroenterology;;   BLADDER SURGERY     CARPAL TUNNEL RELEASE Right 02/11/2019   Procedure: Right Carpal Tunnel Wound Irrigation;  Surgeon: Ashok Pall, MD;  Location: [place];  Service: Neurosurgery;  Laterality: Right;  Right Carpal tunnel wound exploration/wash out   CARPAL TUNNEL RELEASE Bilateral    CHOLECYSTECTOMY  11/22/2012   CHOLECYSTECTOMY  11/22/2012   Procedure: LAPAROSCOPIC CHOLECYSTECTOMY;  Surgeon: Gwenyth Ober, MD;  Location: [place];  Service: General;;   COLONOSCOPY     COLONOSCOPY WITH PROPOFOL N/A 10/09/2019   Procedure: COLONOSCOPY WITH PROPOFOL;  Surgeon: Ronnette Juniper, MD;  [facility];  Service: Gastroenterology;  Laterality: N/A;   ESOPHAGOGASTRODUODENOSCOPY (EGD) WITH PROPOFOL N/A 10/09/2019   Procedure: ESOPHAGOGASTRODUODENOSCOPY (EGD) WITH PROPOFOL;  Surgeon: Ronnette Juniper, MD;  [facility];  Service: Gastroenterology;  Laterality: N/A;   POLYPECTOMY  10/09/2019   Procedure: POLYPECTOMY;  Surgeon: Ronnette Juniper, MD;  [facility];  Service: Gastroenterology;;       Home Medications    Prior to Admission medications   Medication Sig Start Date End Date Taking? Authorizing Provider  acyclovir (ZOVIRAX) 400 MG tablet Take 1 tablet (400 mg total) by mouth 2 (two) times daily. 08/15/21   Earlie Server, MD  Adalimumab (HUMIRA PEN) 40 MG/0.4ML PNKT Inject 40 mg into the skin every 14 (fourteen)  days. Patient not taking: Reported on 08/24/2021 05/10/21   Bo Merino, MD  amoxicillin-clavulanate (AUGMENTIN) 875-125 MG tablet Take 1 tablet by mouth 2 (two) times daily for 10 days. 10/06/21 10/16/21  Hughie Closs, PA-C  aspirin EC 81 MG tablet Take 1 tablet (81 mg total) by mouth daily. 08/15/21   Earlie Server, MD  ferrous sulfate 325 (65 FE) MG EC tablet Take 325 mg by mouth daily with  breakfast.    [provider]  finasteride (PROSCAR) 5 MG tablet Take 5 mg by mouth daily.    [provider]  folic acid (FOLVITE) 1 MG tablet Take 2 tablets (2 mg total) by mouth daily. 03/14/21   Bo Merino, MD  Gauze Pads & Dressings 2"X2" PADS Applied daily with dressing changes. 10/29/20   Raspet, Erin K, PA-C  lenalidomide (REVLIMID) 25 MG capsule Take 1 capsule (25 mg total) by mouth daily. Take for 14 days on, 7 days off, repeat every 21 days. [employer] Auth # ; Date Obtained: 09/28/21   Earlie Server, MD  methotrexate 2.5 MG tablet TAKE 8 TABLETS BY MOUTH ONCE WEEKLY *CAUTION: CHEMOTHERAPY. PROTECTION FROM LIGHT Patient not taking: Reported on 08/24/2021 06/27/21   Ofilia Neas, PA-C  montelukast (SINGULAIR) 10 MG tablet Take 1 tablet (10 mg total) by mouth See admin instructions. Singualair 1 tab on the day prior to daratumumab treatments, 1 tab daily for 2 days after treatment. Daratumumab treatment are once a week 08/15/21   Earlie Server, MD  mupirocin cream (BACTROBAN) 2 % Apply 1 Application topically 2 (two) times daily. 09/21/21   Earlie Server, MD  ondansetron (ZOFRAN) 8 MG tablet Take 1 tablet (8 mg total) by mouth 2 (two) times daily as needed (Nausea or vomiting). 08/15/21   Earlie Server, MD  prochlorperazine (COMPAZINE) 10 MG tablet Take 1 tablet (10 mg total) by mouth every 6 (six) hours as needed (Nausea or vomiting). 08/15/21   Earlie Server, MD  tamsulosin (FLOMAX) 0.4 MG CAPS capsule Take 0.4 mg by mouth daily after supper.    [provider]    Family History Family History  Problem Relation Age of Onset   Hypertension Mother    Diabetes Mother    Multiple myeloma Mother    Hypertension Father    Diabetes Father    Healthy Son    Healthy Daughter     Social History Social History   Tobacco Use   Smoking status: Former    Packs/day: 1.00    Years: 31.00    Total pack years: 31.00    Types: Cigarettes    Quit date: 04/12/1998    Years since quitting:  23.5    Passive exposure: Never   Smokeless tobacco: Never  Vaping Use   Vaping Use: Never used  Substance Use Topics   Alcohol use: No   Drug use: No     Allergies   Atorvastatin, Simvastatin, Other, and Statins   Review of Systems Review of Systems  Skin:  Positive for wound. Negative for color change.     Physical Exam Triage Vital Signs ED Triage Vitals [10/07/21 1124]  Enc Vitals Group     BP 135/85     Pulse Rate 80     Resp 18     Temp 98 F (36.7 C)     Temp Source Oral     SpO2 97 %     Weight      Height      Head Circumference  Peak Flow      Pain Score 0     Pain Loc      Pain Edu?      Excl. in [place]?    No data found.  Updated Vital Signs BP 135/85 (BP Location: Left Arm)   Pulse 80   Temp 98 F (36.7 C) (Oral)   Resp 18   SpO2 97%   Visual Acuity Right Eye Distance:   Left Eye Distance:   Bilateral Distance:    Right Eye Near:   Left Eye Near:    Bilateral Near:     Physical Exam Constitutional:      Appearance: Normal appearance.  Musculoskeletal:        General: Normal range of motion.  Skin:    General: Skin is warm.     Comments: Wound on left elbow.  No surrounding erythema.  Drainage is minimal.  No fluctuance noted.  Neurological:     Mental Status: He is alert.      UC Treatments / Results  Labs (all labs ordered are listed, but only abnormal results are displayed) Labs Reviewed - No data to display  EKG   Radiology No results found.  Procedures Procedures (including critical care time)  Medications Ordered in UC Medications - No data to display  Initial Impression / Assessment and Plan / UC Course  I have reviewed the triage vital signs and the nursing notes.  Pertinent labs & imaging results that were available during my care of the patient were reviewed by me and considered in my medical decision making (see chart for details).     Abscess of left forearm status post incision and  drainage Patient advised to continue antibiotics Daily wound dressing changes Warm compresses Return precautions given. Final Clinical Impressions(s) / UC Diagnoses   Final diagnoses:  Abscess of left forearm     Discharge Instructions      Okay to wash with soap and water Daily wound dressing changes with nonadherent dressing Complete the course of antibiotics Return to urgent care if symptoms worsen.   ED Prescriptions   None    PDMP not reviewed this encounter.   Chase Picket, MD 10/10/21 1728

## 2021-10-11 ENCOUNTER — Telehealth: Payer: Self-pay | Admitting: Hospice and Palliative Medicine

## 2021-10-11 NOTE — Telephone Encounter (Signed)
LVM for pt to give [place] a call to r/s appt with Josh per provider to later in the afternoon due to schedule change .Marland KitchenKJ

## 2021-10-12 ENCOUNTER — Ambulatory Visit: Payer: Medicare Other | Admitting: Oncology

## 2021-10-12 ENCOUNTER — Other Ambulatory Visit: Payer: Medicare Other

## 2021-10-13 ENCOUNTER — Encounter: Payer: Self-pay | Admitting: Hospice and Palliative Medicine

## 2021-10-13 ENCOUNTER — Encounter: Payer: Medicare Other | Admitting: Hospice and Palliative Medicine

## 2021-10-13 ENCOUNTER — Ambulatory Visit: Payer: Medicare Other

## 2021-10-13 ENCOUNTER — Encounter: Payer: Medicare Other | Admitting: Medical Oncology

## 2021-10-13 ENCOUNTER — Other Ambulatory Visit: Payer: Self-pay

## 2021-10-13 ENCOUNTER — Inpatient Hospital Stay (HOSPITAL_BASED_OUTPATIENT_CLINIC_OR_DEPARTMENT_OTHER): Payer: Medicare Other | Admitting: Hospice and Palliative Medicine

## 2021-10-13 VITALS — BP 116/68 | HR 71 | Temp 98.1°F | Resp 22 | Ht 68.0 in | Wt 318.0 lb

## 2021-10-13 DIAGNOSIS — C9000 Multiple myeloma not having achieved remission: Secondary | ICD-10-CM | POA: Diagnosis not present

## 2021-10-13 DIAGNOSIS — Z79624 Long term (current) use of inhibitors of nucleotide synthesis: Secondary | ICD-10-CM | POA: Diagnosis not present

## 2021-10-13 DIAGNOSIS — Z7982 Long term (current) use of aspirin: Secondary | ICD-10-CM | POA: Diagnosis not present

## 2021-10-13 DIAGNOSIS — Z5112 Encounter for antineoplastic immunotherapy: Secondary | ICD-10-CM | POA: Diagnosis not present

## 2021-10-13 DIAGNOSIS — Z79899 Other long term (current) drug therapy: Secondary | ICD-10-CM | POA: Diagnosis not present

## 2021-10-13 DIAGNOSIS — Z7961 Long term (current) use of immunomodulator: Secondary | ICD-10-CM | POA: Diagnosis not present

## 2021-10-13 DIAGNOSIS — L02414 Cutaneous abscess of left upper limb: Secondary | ICD-10-CM

## 2021-10-13 DIAGNOSIS — D84821 Immunodeficiency due to drugs: Secondary | ICD-10-CM | POA: Diagnosis not present

## 2021-10-13 DIAGNOSIS — T451X5A Adverse effect of antineoplastic and immunosuppressive drugs, initial encounter: Secondary | ICD-10-CM | POA: Diagnosis not present

## 2021-10-13 DIAGNOSIS — M069 Rheumatoid arthritis, unspecified: Secondary | ICD-10-CM | POA: Diagnosis not present

## 2021-10-13 DIAGNOSIS — D6481 Anemia due to antineoplastic chemotherapy: Secondary | ICD-10-CM | POA: Diagnosis not present

## 2021-10-13 NOTE — Progress Notes (Signed)
Symptom Management [place] at [facility] [phone] [phone]  Patient Care Team: Seward Carol, MD as PCP - General (Internal Medicine)   Name of the patient: Eric Cruz  [id number]  Jul 14, 1950   Date of visit: 10/13/21  Reason for Consult: Eric Cruz is a 71 y.o. male with multiple medical problems includingmultiple myeloma not in remission on Dara RVD chemotherapy.   Patient was recently seen in [facility] and the ER on 10/07/2021 for abscess to left forearm status post I&D.  He failed doxycycline and was rotated to Augmentin.   Patient presents [facility] today for wound check.  Patient reports that he is doing much better.  He is no longer having pain in his abscess is no longer draining.  He denies fever or chills.  Denies other dermatological problems.   Denies any neurologic complaints. Denies recent fevers or illnesses. Denies any easy bleeding or bruising. Reports good appetite and denies weight loss. Denies chest pain. Denies any nausea, vomiting, constipation, or diarrhea. Denies urinary complaints. Patient offers no further specific complaints today.  PAST MEDICAL HISTORY: Past Medical History:  Diagnosis Date   Arthritis    BPH (benign prostatic hyperplasia)    Cancer ([facility])    Prostate: states he had a positive biopsy but had another one a year later and it was gone.    Depression    Dizziness    GERD (gastroesophageal reflux disease)    Gout    High cholesterol    Hypertension    Sleep apnea    has cpap - not currently wearing   Varicose veins     PAST SURGICAL HISTORY:  Past Surgical History:  Procedure Laterality Date   ANTERIOR CERVICAL DECOMP/DISCECTOMY FUSION N/A 02/21/2018   Procedure: Cervical Five-Six Cervical Six-Seven Anterior cervical decompression/discectomy/fusion;  Surgeon: Ashok Pall, MD;  Location: [place];  Service: Neurosurgery;  Laterality: N/A;  Cervical Five-Six Cervical Six-Seven  Anterior cervical decompression/discectomy/fusion   BIOPSY  10/09/2019   Procedure: BIOPSY;  Surgeon: Ronnette Juniper, MD;  [facility];  Service: Gastroenterology;;   BLADDER SURGERY     CARPAL TUNNEL RELEASE Right 02/11/2019   Procedure: Right Carpal Tunnel Wound Irrigation;  Surgeon: Ashok Pall, MD;  Location: [place];  Service: Neurosurgery;  Laterality: Right;  Right Carpal tunnel wound exploration/wash out   CARPAL TUNNEL RELEASE Bilateral    CHOLECYSTECTOMY  11/22/2012   CHOLECYSTECTOMY  11/22/2012   Procedure: LAPAROSCOPIC CHOLECYSTECTOMY;  Surgeon: Gwenyth Ober, MD;  Location: [place];  Service: General;;   COLONOSCOPY     COLONOSCOPY WITH PROPOFOL N/A 10/09/2019   Procedure: COLONOSCOPY WITH PROPOFOL;  Surgeon: Ronnette Juniper, MD;  [facility];  Service: Gastroenterology;  Laterality: N/A;   ESOPHAGOGASTRODUODENOSCOPY (EGD) WITH PROPOFOL N/A 10/09/2019   Procedure: ESOPHAGOGASTRODUODENOSCOPY (EGD) WITH PROPOFOL;  Surgeon: Ronnette Juniper, MD;  [facility];  Service: Gastroenterology;  Laterality: N/A;   POLYPECTOMY  10/09/2019   Procedure: POLYPECTOMY;  Surgeon: Ronnette Juniper, MD;  [facility];  Service: Gastroenterology;;    HEMATOLOGY/ONCOLOGY HISTORY:  Oncology History  Multiple myeloma not having achieved remission ([place])  04/22/2020 Initial Diagnosis   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  27% plasma cell, cytogenetics - normal, and myeloma FISH panel is negative.  Standard risk.  [place]ongo red staining was added and was negative.  - 07/27/2021, bone marrow biopsy showed hypercellular marrow involved by plasma cell myeloma,  CD138 immunohistochemistry plasma cells compromise approximately 70% of the cellular elements and are lambda restricted by light chain in situ hybridization.  Cytogenetics showed duplication of 1q.  Cytogenetics is normal.   04/22/2020 Cancer  Staging   Staging form: Plasma Cell Myeloma and Plasma Cell Disorders, AJCC 8th Edition - Clinical stage from 04/22/2020: RISS Stage II (Beta-2-microglobulin (mg/L): 2.7, Albumin (g/dL): 3.1, ISS: Stage II, High-risk cytogenetics: Absent, LDH: Normal) - Signed by Earlie Server, MD on 08/10/2021 Stage prefix: Initial diagnosis Beta 2 microglobulin range (mg/L): Less than 3.5 Albumin range (g/dL): Less than 3.5 Cytogenetics: 1q addition Lactate dehydrogenase (LDH) (U/L): 136 Serum calcium level: Normal Serum creatinine level: Normal   08/12/2021 Imaging   PET scan showed 1. No evidence of active myeloma on skull base to thigh FDG PET scan. 2. No soft tissue plasmacytoma.3. No lytic or suspicious lesion on CT portion exam.     08/24/2021 -  Chemotherapy   Patient is on Treatment Plan : MYELOMA NEWLY DIAGNOSED TRANSPLANT CANDIDATE DaraVRd (Daratumumab SQ) q21d x 6 Cycles (Induction/Consolidation)       ALLERGIES:  is allergic to atorvastatin, simvastatin, other, and statins.  MEDICATIONS:  Current Outpatient Medications  Medication Sig Dispense Refill   acyclovir (ZOVIRAX) 400 MG tablet Take 1 tablet (400 mg total) by mouth 2 (two) times daily. 60 tablet 5   amoxicillin-clavulanate (AUGMENTIN) 875-125 MG tablet Take 1 tablet by mouth 2 (two) times daily for 10 days. 20 tablet 0   aspirin EC 81 MG tablet Take 1 tablet (81 mg total) by mouth daily. 100 tablet 3   ferrous sulfate 325 (65 FE) MG EC tablet Take 325 mg by mouth daily with breakfast.     finasteride (PROSCAR) 5 MG tablet Take 5 mg by mouth daily.     folic acid (FOLVITE) 1 MG tablet Take 2 tablets (2 mg total) by mouth daily. 180 tablet 3   montelukast (SINGULAIR) 10 MG tablet Take 1 tablet (10 mg total) by mouth See admin instructions. Singualair 1 tab on the day prior to daratumumab treatments, 1 tab daily for 2 days after treatment. Daratumumab treatment are once a week 30 tablet 0   mupirocin cream (BACTROBAN) 2 % Apply 1  Application topically 2 (two) times daily. 30 g 0   tamsulosin (FLOMAX) 0.4 MG CAPS capsule Take 0.4 mg by mouth daily after supper.     Adalimumab (HUMIRA PEN) 40 MG/0.4ML PNKT Inject 40 mg into the skin every 14 (fourteen) days. (Patient not taking: Reported on 08/24/2021) 2 each 2   lenalidomide (REVLIMID) 25 MG capsule Take 1 capsule (25 mg total) by mouth daily. Take for 14 days on, 7 days off, repeat every 21 days. [employer] Auth # ; Date Obtained: (Patient not taking: Reported on 10/13/2021) 14 capsule 0   methotrexate 2.5 MG tablet TAKE 8 TABLETS BY MOUTH ONCE WEEKLY *CAUTION: CHEMOTHERAPY. PROTECTION FROM LIGHT (Patient not taking: Reported on 08/24/2021) 96 tablet 0   ondansetron (ZOFRAN) 8 MG tablet Take 1 tablet (8 mg total) by mouth 2 (two) times daily as needed (Nausea or vomiting). (Patient not taking: Reported on 10/13/2021) 30 tablet 1   prochlorperazine (COMPAZINE) 10 MG tablet Take 1 tablet (10 mg total) by mouth every 6 (six) hours as needed (Nausea or vomiting). (Patient not taking: Reported on 10/13/2021) 30 tablet 1   No current facility-administered medications for this visit.    VITAL SIGNS: BP 116/68   Pulse 71   Temp 98.1 F (36.7 C) (Tympanic)  Resp (!) 22   Ht '5\' 8"'  (1.727 m)   Wt (!) 318 lb (144.2 kg)   BMI 48.35 kg/m  Filed Weights   10/13/21 1053  Weight: (!) 318 lb (144.2 kg)    Estimated body mass index is 48.35 kg/m as calculated from the following:   Height as of this encounter: '5\' 8"'  (1.727 m).   Weight as of this encounter: 318 lb (144.2 kg).  LABS: CBC:    Component Value Date/Time   WBC 8.2 10/06/2021 0803   HGB 11.3 (L) 10/06/2021 0803   HCT 34.8 (L) 10/06/2021 0803   PLT 251 10/06/2021 0803   MCV 94.8 10/06/2021 0803   NEUTROABS 4.9 10/06/2021 0803   LYMPHSABS 1.6 10/06/2021 0803   MONOABS 1.2 (H) 10/06/2021 0803   EOSABS 0.4 10/06/2021 0803   BASOSABS 0.0 10/06/2021 0803   Comprehensive Metabolic Panel:    Component Value Date/Time    NA 137 10/06/2021 0803   K 3.9 10/06/2021 0803   CL 105 10/06/2021 0803   CO2 22 10/06/2021 0803   BUN 14 10/06/2021 0803   CREATININE 0.96 10/06/2021 0803   CREATININE 1.07 03/14/2021 1218   GLUCOSE 97 10/06/2021 0803   CALCIUM 8.7 (L) 10/06/2021 0803   AST 17 10/06/2021 0803   ALT 7 10/06/2021 0803   ALKPHOS 70 10/06/2021 0803   BILITOT 0.5 10/06/2021 0803   PROT 6.5 10/06/2021 0803   ALBUMIN 3.4 (L) 10/06/2021 0803    RADIOGRAPHIC STUDIES: No results found.  PERFORMANCE STATUS (ECOG) : 2 - Symptomatic, <50% confined to bed  Review of Systems Unless otherwise noted, a complete review of systems is negative.  Physical Exam General: NAD Cardiovascular: regular rate and rhythm Pulmonary: clear ant fields Abdomen: soft, nontender, + bowel sounds GU: no suprapubic tenderness Extremities: no edema, no joint deformities Skin: no rashes, healing abscess no longer indurated, no erythema or drainage Neurological: Weakness but otherwise nonfocal     Assessment and Plan- Patient is a 71 y.o. male  including multiple myeloma not in remission on Dara RVD chemotherapy.   Abscess -recommend completing Augmentin course.  This appears to be healing well.  Patient to monitor for changes or complications.  Otherwise, he is scheduled to return next week for treatment but can be seen sooner if needed.    Patient expressed understanding and was in agreement with this plan. He also understands that He can call clinic at any time with any questions, concerns, or complaints.   Thank you for allowing me to participate in the care of this very pleasant patient.   Time Total: 15 minutes  Visit consisted of counseling and education dealing with the complex and emotionally intense issues of symptom management in the setting of serious illness.Greater than 50%  of this time was spent counseling and coordinating care related to the above assessment and plan.  Signed by: Altha Harm, PhD,  NP-C

## 2021-10-19 ENCOUNTER — Inpatient Hospital Stay: Payer: Medicare Other

## 2021-10-19 ENCOUNTER — Inpatient Hospital Stay (HOSPITAL_BASED_OUTPATIENT_CLINIC_OR_DEPARTMENT_OTHER): Payer: Medicare Other | Admitting: Oncology

## 2021-10-19 ENCOUNTER — Encounter: Payer: Self-pay | Admitting: Oncology

## 2021-10-19 VITALS — BP 114/66 | HR 62 | Temp 97.4°F | Resp 18

## 2021-10-19 DIAGNOSIS — Z5111 Encounter for antineoplastic chemotherapy: Secondary | ICD-10-CM | POA: Diagnosis not present

## 2021-10-19 DIAGNOSIS — D6481 Anemia due to antineoplastic chemotherapy: Secondary | ICD-10-CM | POA: Diagnosis not present

## 2021-10-19 DIAGNOSIS — D649 Anemia, unspecified: Secondary | ICD-10-CM

## 2021-10-19 DIAGNOSIS — Z79899 Other long term (current) drug therapy: Secondary | ICD-10-CM | POA: Diagnosis not present

## 2021-10-19 DIAGNOSIS — L02414 Cutaneous abscess of left upper limb: Secondary | ICD-10-CM | POA: Diagnosis not present

## 2021-10-19 DIAGNOSIS — Z5112 Encounter for antineoplastic immunotherapy: Secondary | ICD-10-CM | POA: Diagnosis not present

## 2021-10-19 DIAGNOSIS — C9000 Multiple myeloma not having achieved remission: Secondary | ICD-10-CM

## 2021-10-19 DIAGNOSIS — G629 Polyneuropathy, unspecified: Secondary | ICD-10-CM

## 2021-10-19 DIAGNOSIS — T451X5A Adverse effect of antineoplastic and immunosuppressive drugs, initial encounter: Secondary | ICD-10-CM | POA: Diagnosis not present

## 2021-10-19 DIAGNOSIS — M069 Rheumatoid arthritis, unspecified: Secondary | ICD-10-CM

## 2021-10-19 DIAGNOSIS — D84821 Immunodeficiency due to drugs: Secondary | ICD-10-CM | POA: Diagnosis not present

## 2021-10-19 DIAGNOSIS — Z79624 Long term (current) use of inhibitors of nucleotide synthesis: Secondary | ICD-10-CM | POA: Diagnosis not present

## 2021-10-19 DIAGNOSIS — Z7961 Long term (current) use of immunomodulator: Secondary | ICD-10-CM | POA: Diagnosis not present

## 2021-10-19 DIAGNOSIS — Z7982 Long term (current) use of aspirin: Secondary | ICD-10-CM | POA: Diagnosis not present

## 2021-10-19 LAB — COMPREHENSIVE METABOLIC PANEL
ALT: 7 U/L (ref 0–44)
AST: 15 U/L (ref 15–41)
Albumin: 3.5 g/dL (ref 3.5–5.0)
Alkaline Phosphatase: 82 U/L (ref 38–126)
Anion gap: 6 (ref 5–15)
BUN: 15 mg/dL (ref 8–23)
CO2: 24 mmol/L (ref 22–32)
Calcium: 9 mg/dL (ref 8.9–10.3)
Chloride: 108 mmol/L (ref 98–111)
Creatinine, Ser: 0.9 mg/dL (ref 0.61–1.24)
GFR, Estimated: >60 mL/min (ref >60)
Glucose, Bld: 98 mg/dL (ref 70–99)
Potassium: 4 mmol/L (ref 3.5–5.1)
Sodium: 138 mmol/L (ref 135–145)
Total Bilirubin: 0.4 mg/dL (ref 0.3–1.2)
Total Protein: 6.8 g/dL (ref 6.5–8.1)

## 2021-10-19 LAB — CBC WITH DIFFERENTIAL/PLATELET
Abs Immature Granulocytes: 0.01 10*3/uL (ref 0.00–0.07)
Basophils Absolute: 0.1 10*3/uL (ref 0.0–0.1)
Basophils Relative: 1 %
Eosinophils Absolute: 0.2 10*3/uL (ref 0.0–0.5)
Eosinophils Relative: 3 %
HCT: 37.3 % — ABNORMAL LOW (ref 39.0–52.0)
Hemoglobin: 11.9 g/dL — ABNORMAL LOW (ref 13.0–17.0)
Immature Granulocytes: 0 %
Lymphocytes Relative: 33 %
Lymphs Abs: 2.2 10*3/uL (ref 0.7–4.0)
MCH: 30.4 pg (ref 26.0–34.0)
MCHC: 31.9 g/dL (ref 30.0–36.0)
MCV: 95.2 fL (ref 80.0–100.0)
Monocytes Absolute: 0.7 10*3/uL (ref 0.1–1.0)
Monocytes Relative: 10 %
Neutro Abs: 3.5 10*3/uL (ref 1.7–7.7)
Neutrophils Relative %: 53 %
Platelets: 259 10*3/uL (ref 150–400)
RBC: 3.92 MIL/uL — ABNORMAL LOW (ref 4.22–5.81)
RDW: 15.3 % (ref 11.5–15.5)
WBC: 6.7 10*3/uL (ref 4.0–10.5)
nRBC: 0 % (ref 0.0–0.2)

## 2021-10-19 MED ORDER — DARATUMUMAB-HYALURONIDASE-FIHJ 1800-30000 MG-UT/15ML ~~LOC~~ SOLN
1800.0000 mg | Freq: Once | SUBCUTANEOUS | Status: AC
  Administered 2021-10-19: 1800 mg via SUBCUTANEOUS
  Filled 2021-10-19: qty 15

## 2021-10-19 MED ORDER — DIPHENHYDRAMINE HCL 25 MG PO CAPS
50.0000 mg | ORAL_CAPSULE | Freq: Once | ORAL | Status: AC
  Administered 2021-10-19: 50 mg via ORAL
  Filled 2021-10-19: qty 2

## 2021-10-19 MED ORDER — BORTEZOMIB CHEMO SQ INJECTION 3.5 MG (2.5MG/ML)
1.3000 mg/m2 | Freq: Once | INTRAMUSCULAR | Status: AC
  Administered 2021-10-19: 3.5 mg via SUBCUTANEOUS
  Filled 2021-10-19: qty 1.4

## 2021-10-19 MED ORDER — ACETAMINOPHEN 325 MG PO TABS
650.0000 mg | ORAL_TABLET | Freq: Once | ORAL | Status: AC
  Administered 2021-10-19: 650 mg via ORAL
  Filled 2021-10-19: qty 2

## 2021-10-19 MED ORDER — DEXAMETHASONE 4 MG PO TABS
20.0000 mg | ORAL_TABLET | Freq: Once | ORAL | Status: AC
  Administered 2021-10-19: 20 mg via ORAL
  Filled 2021-10-19: qty 5

## 2021-10-19 NOTE — Assessment & Plan Note (Signed)
Chemotherapy plan as listed above 

## 2021-10-19 NOTE — Assessment & Plan Note (Addendum)
secondary to chemotherapy, and chronic disease.  Stable.

## 2021-10-19 NOTE — Patient Instructions (Signed)
[facility] AT [place]  Discharge Instructions: Thank you for choosing [place] to provide your oncology and hematology care.  If you have a lab appointment with the [place], please go directly to the [place] and check in at the registration area.  Wear comfortable clothing and clothing appropriate for easy access to any Portacath or PICC line.   We strive to give you quality time with your provider. You may need to reschedule your appointment if you arrive late (15 or more minutes).  Arriving late affects you and other patients whose appointments are after yours.  Also, if you miss three or more appointments without notifying the office, you may be dismissed from the clinic at the provider's discretion.      For prescription refill requests, have your pharmacy contact our office and allow 72 hours for refills to be completed.    Today you received the following chemotherapy and/or immunotherapy agents Velcade and Darzalex       To help prevent nausea and vomiting after your treatment, we encourage you to take your nausea medication as directed.  BELOW ARE SYMPTOMS THAT SHOULD BE REPORTED IMMEDIATELY: *FEVER GREATER THAN 100.4 F (38 C) OR HIGHER *CHILLS OR SWEATING *NAUSEA AND VOMITING THAT IS NOT CONTROLLED WITH YOUR NAUSEA MEDICATION *UNUSUAL SHORTNESS OF BREATH *UNUSUAL BRUISING OR BLEEDING *URINARY PROBLEMS (pain or burning when urinating, or frequent urination) *BOWEL PROBLEMS (unusual diarrhea, constipation, pain near the anus) TENDERNESS IN MOUTH AND THROAT WITH OR WITHOUT PRESENCE OF ULCERS (sore throat, sores in mouth, or a toothache) UNUSUAL RASH, SWELLING OR PAIN  UNUSUAL VAGINAL DISCHARGE OR ITCHING   Items with * indicate a potential emergency and should be followed up as soon as possible or go to the Emergency Department if any problems should occur.  Please show the CHEMOTHERAPY ALERT CARD or IMMUNOTHERAPY ALERT CARD at  check-in to the Emergency Department and triage nurse.  Should you have questions after your visit or need to cancel or reschedule your appointment, please contact [facility] [place] AT [place]  [phone] and follow the prompts.  Office hours are 8:00 a.m. to 4:30 p.m. Monday - Friday. Please note that voicemails left after 4:00 p.m. may not be returned until the following business day.  We are closed weekends and major holidays. You have access to a nurse at all times for urgent questions. Please call the main number to the clinic [phone] and follow the prompts.  For any non-urgent questions, you may also contact your provider using MyChart. We now offer e-Visits for anyone 22 and older to request care online for non-urgent symptoms. For details visit [email or web address].   Also download the MyChart app! Go to the app store, search "MyChart", open the app, select [facility], and log in with your MyChart username and password.  Masks are optional in the cancer centers. If you would like for your care team to wear a mask while they are taking care of you, please let them know. For doctor visits, patients may have with them one support person who is at least 69 years old. At this time, visitors are not allowed in the infusion area.

## 2021-10-19 NOTE — Assessment & Plan Note (Signed)
ollow up with rheumatology.  Currently he is off Humira and methotrexate.  I agree with holding the treatment to avoid synergistic bone marrow suppression.  If his symptoms get worse, we will further discuss with patient's rheumatologist about resuming Dr. Estanislado Pandy treatments.

## 2021-10-19 NOTE — Assessment & Plan Note (Signed)
secondary to carpal tunnel disease and chemotherapy Stable symptoms.  Observation

## 2021-10-19 NOTE — Progress Notes (Signed)
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 Proceed with cycle 3  Dara-RVD Singualair 1m 1 day prior to daratumumab treatments, 2 days after treatment.  Discussed about patient about rationale of bone strengthening agents and potential side effects Recommend patient to obtain dental clearance.  Encounter for antineoplastic chemotherapy Chemotherapy plan as listed above.  [place].  Currently he is off Humira and methotrexate.  I agree with holding the treatment to avoid synergistic bone marrow suppression.  If his symptoms get worse, we will further discuss with patient's rheumatologist about resuming Dr. DEstanislado Pandytreatments.  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  Observation  Normocytic anemia secondary to chemotherapy, and chronic disease.  Stable.  No orders of the defined types were placed in this encounter.  Follow up  1 week lab Dara-Velcade Dex  2 weeks lab MD Dara-Velcade Dex    All questions were answered. The patient knows to call the clinic with any problems, questions or concerns.  ZEarlie Server MD, PhD [facility] Oncology 10/19/2021    CHIEF COMPLAINTS/REASON FOR VISIT:  Follow-up for multiple myeloma  HISTORY OF PRESENTING ILLNESS:  Eric SPATAFOREis a 71y.o. male who was seen in consultation at the request of PSeward Carol MD for evaluation of abnormal SPEP results, anemia. .   Patient recently had work up done at rheumatologist's office and was diagnosed with rheumatoid arthritis and patient was recommended to start MTX. He has not started  yet Labs reviewed,  03/04/20 SPEP showed abnormal protein band of 1.7g/dl.,   , and IFE showed IgA Lamda monoclonal protein.  He has had weight loss last year, no weight loss and some weight gain during the past 6 months.   Multiple join pain.   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  27% plasma cell, cytogenetics - normal, and myeloma FISH panel is negative.  Standard risk.  Congo red staining was added and was negative. normal kidney function, calcium level, hemoglobin above 10 and no bone lesions. albumin is decreased at 3.1, slightly elevated beta microglobulin.  Stage II, anemia with hemoglobin above 10.  # 07/27/2021, bone marrow biopsy showed hypercellular marrow involved by plasma cell myeloma, CD138 immunohistochemistry plasma cells compromise approximately 70% of the cellular elements and are lambda restricted by light chain in situ hybridization.  Cytogenetics showed duplication of 1q.  Cytogenetics is normal.  For rheumatoid arthritis, patient was seen by Dr. DEstanislado Pandyrecently and is currently off methotrexate and Humira.  08/15/2021, PET scan showed no evidence of active myeloma on skull base to thigh FDG PET scan.  No soft tissue plasmacytoma.  No lytic or suspicious lesion on CT portion of examination  INTERVAL HISTORY GCARLITOS BOTTINOis a 71y.o. male who has above history reviewed by me today presents for follow up visit for management of multiple myeloma Patient tolerated daratumumab Velcade last week.  He is on Revlimid.  Denies any nausea vomiting diarrhea. Left elbow skin infection has improved.  Healing well.  He has finished course of antibiotics. Cycle 2-day 15 treatment was omitted due to skin  infection.  Review of Systems  Constitutional:  Positive for fatigue. Negative for appetite change, chills, fever and unexpected weight change.  HENT:   Negative for  hearing loss and voice change.   Eyes:  Negative for eye problems and icterus.  Respiratory:  Negative for chest tightness, cough and shortness of breath.   Cardiovascular:  Negative for chest pain and leg swelling.  Gastrointestinal:  Negative for abdominal distention and abdominal pain.  Endocrine: Negative for hot flashes.  Genitourinary:  Negative for difficulty urinating, dysuria and frequency.   Musculoskeletal:  Positive for arthralgias and back pain.  Skin:  Negative for itching and rash.  Neurological:  Negative for light-headedness and numbness.  Hematological:  Negative for adenopathy. Does not bruise/bleed easily.  Psychiatric/Behavioral:  Negative for confusion.      MEDICAL HISTORY:  Past Medical History:  Diagnosis Date   Arthritis    BPH (benign prostatic hyperplasia)    Cancer (HCC)    Prostate: states he had a positive biopsy but had another one a year later and it was gone.    Depression    Dizziness    GERD (gastroesophageal reflux disease)    Gout    High cholesterol    Hypertension    Sleep apnea    has cpap - not currently wearing   Varicose veins     SURGICAL HISTORY: Past Surgical History:  Procedure Laterality Date   ANTERIOR CERVICAL DECOMP/DISCECTOMY FUSION N/A 02/21/2018   Procedure: Cervical Five-Six Cervical Six-Seven Anterior cervical decompression/discectomy/fusion;  Surgeon: Ashok Pall, MD;  Location: [facility];  Service: Neurosurgery;  Laterality: N/A;  Cervical Five-Six Cervical Six-Seven Anterior cervical decompression/discectomy/fusion   BIOPSY  10/09/2019   Procedure: BIOPSY;  Surgeon: Ronnette Juniper, MD;  Location: [facility];  Service: Gastroenterology;;   BLADDER SURGERY     CARPAL TUNNEL RELEASE Right 02/11/2019   Procedure: Right Carpal Tunnel Wound Irrigation;  Surgeon: Ashok Pall, MD;  Location: [facility];  Service: Neurosurgery;  Laterality: Right;  Right Carpal tunnel wound exploration/wash out   CARPAL TUNNEL RELEASE Bilateral     CHOLECYSTECTOMY  11/22/2012   CHOLECYSTECTOMY  11/22/2012   Procedure: LAPAROSCOPIC CHOLECYSTECTOMY;  Surgeon: Gwenyth Ober, MD;  Location: [facility];  Service: General;;   COLONOSCOPY     COLONOSCOPY WITH PROPOFOL N/A 10/09/2019   Procedure: COLONOSCOPY WITH PROPOFOL;  Surgeon: Ronnette Juniper, MD;  Location: [facility];  Service: Gastroenterology;  Laterality: N/A;   ESOPHAGOGASTRODUODENOSCOPY (EGD) WITH PROPOFOL N/A 10/09/2019   Procedure: ESOPHAGOGASTRODUODENOSCOPY (EGD) WITH PROPOFOL;  Surgeon: Ronnette Juniper, MD;  Location: [facility];  Service: Gastroenterology;  Laterality: N/A;   POLYPECTOMY  10/09/2019   Procedure: POLYPECTOMY;  Surgeon: Ronnette Juniper, MD;  Location: [facility];  Service: Gastroenterology;;    SOCIAL HISTORY: Social History   Socioeconomic History   Marital status: Married    Spouse name: Not on file   Number of children: Not on file   Years of education: Not on file   Highest education level: Not on file  Occupational History   Not on file  Tobacco Use   Smoking status: Former    Packs/day: 1.00    Years: 31.00    Total pack years: 31.00    Types: Cigarettes    Quit date: 04/12/1998    Years since quitting: 23.5    Passive exposure: Never   Smokeless tobacco: Never  Vaping Use   Vaping Use: Never used  Substance and Sexual Activity   Alcohol use: No   Drug  use: No   Sexual activity: Not on file  Other Topics Concern   Not on file  Social History Narrative   Not on file   Social Determinants of Health   Financial Resource Strain: Not on file  Food Insecurity: Not on file  Transportation Needs: Not on file  Physical Activity: Not on file  Stress: Not on file  Social Connections: Not on file  Intimate Partner Violence: Not on file    FAMILY HISTORY: Family History  Problem Relation Age of Onset   Hypertension Mother    Diabetes Mother    Multiple myeloma Mother    Hypertension Father    Diabetes Father    Healthy Son    Healthy Daughter      ALLERGIES:  is allergic to atorvastatin, simvastatin, other, and statins.  MEDICATIONS:  Current Outpatient Medications  Medication Sig Dispense Refill   acyclovir (ZOVIRAX) 400 MG tablet Take 1 tablet (400 mg total) by mouth 2 (two) times daily. 60 tablet 5   aspirin EC 81 MG tablet Take 1 tablet (81 mg total) by mouth daily. 100 tablet 3   ferrous sulfate 325 (65 FE) MG EC tablet Take 325 mg by mouth daily with breakfast.     finasteride (PROSCAR) 5 MG tablet Take 5 mg by mouth daily.     folic acid (FOLVITE) 1 MG tablet Take 2 tablets (2 mg total) by mouth daily. 180 tablet 3   lenalidomide (REVLIMID) 25 MG capsule Take 1 capsule (25 mg total) by mouth daily. Take for 14 days on, 7 days off, repeat every 21 days. [employer] Auth # ; Date Obtained: 14 capsule 0   montelukast (SINGULAIR) 10 MG tablet Take 1 tablet (10 mg total) by mouth See admin instructions. Singualair 1 tab on the day prior to daratumumab treatments, 1 tab daily for 2 days after treatment. Daratumumab treatment are once a week 30 tablet 0   mupirocin cream (BACTROBAN) 2 % Apply 1 Application topically 2 (two) times daily. 30 g 0   tamsulosin (FLOMAX) 0.4 MG CAPS capsule Take 0.4 mg by mouth daily after supper.     Adalimumab (HUMIRA PEN) 40 MG/0.4ML PNKT Inject 40 mg into the skin every 14 (fourteen) days. (Patient not taking: Reported on 08/24/2021) 2 each 2   methotrexate 2.5 MG tablet TAKE 8 TABLETS BY MOUTH ONCE WEEKLY *CAUTION: CHEMOTHERAPY. PROTECTION FROM LIGHT (Patient not taking: Reported on 08/24/2021) 96 tablet 0   ondansetron (ZOFRAN) 8 MG tablet Take 1 tablet (8 mg total) by mouth 2 (two) times daily as needed (Nausea or vomiting). (Patient not taking: Reported on 10/13/2021) 30 tablet 1   prochlorperazine (COMPAZINE) 10 MG tablet Take 1 tablet (10 mg total) by mouth every 6 (six) hours as needed (Nausea or vomiting). (Patient not taking: Reported on 10/13/2021) 30 tablet 1   No current facility-administered  medications for this visit.     PHYSICAL EXAMINATION: ECOG PERFORMANCE STATUS: 1 - Symptomatic but completely ambulatory Vitals:   10/19/21 0849  BP: 139/82  Pulse: 62  Resp: 20  Temp: (!) 97 F (36.1 C)  SpO2: 95%   Filed Weights   10/19/21 0849  Weight: (!) 319 lb (144.7 kg)    Physical Exam Constitutional:      General: He is not in acute distress.    Appearance: He is obese.     Comments: Patient walks with a cane.  HENT:     Head: Normocephalic and atraumatic.  Eyes:  General: No scleral icterus. Cardiovascular:     Rate and Rhythm: Normal rate and regular rhythm.     Heart sounds: Normal heart sounds.  Pulmonary:     Effort: Pulmonary effort is normal. No respiratory distress.     Breath sounds: No wheezing.  Abdominal:     General: Bowel sounds are normal. There is no distension.     Palpations: Abdomen is soft.  Musculoskeletal:        General: No deformity. Normal range of motion.     Cervical back: Normal range of motion and neck supple.     Comments: Trace edema, bilateral low extremities.   Skin:    General: Skin is warm and dry.     Findings: No erythema or rash.  Neurological:     Mental Status: He is alert and oriented to person, place, and time. Mental status is at baseline.     Cranial Nerves: No cranial nerve deficit.     Coordination: Coordination normal.  Psychiatric:        Mood and Affect: Mood normal.      LABORATORY DATA:  I have reviewed the data as listed Lab Results  Component Value Date   WBC 6.7 10/19/2021   HGB 11.9 (L) 10/19/2021   HCT 37.3 (L) 10/19/2021   MCV 95.2 10/19/2021   PLT 259 10/19/2021   Recent Labs    09/28/21 0814 10/06/21 0803 10/19/21 0834  NA 134* 137 138  K 3.8 3.9 4.0  CL 103 105 108  CO2 '23 22 24  ' GLUCOSE 107* 97 98  BUN '14 14 15  ' CREATININE 1.11 0.96 0.90  CALCIUM 8.6* 8.7* 9.0  GFRNONAA >60 >60 >60  PROT 6.9 6.5 6.8  ALBUMIN 3.5 3.4* 3.5  AST 14* 17 15  ALT '8 7 7  ' ALKPHOS 78 70  82  BILITOT 0.6 0.5 0.4    Iron/TIBC/Ferritin/ %Sat    Component Value Date/Time   IRON 78 04/05/2021 1008   TIBC 274 04/05/2021 1008   FERRITIN 425 (H) 04/05/2021 1008   IRONPCTSAT 28 04/05/2021 1008      RADIOGRAPHIC STUDIES: I have personally reviewed the radiological images as listed and agreed with the findings in the report.  NM PET Image Restage (PS) Whole Body  Result Date: 08/15/2021 CLINICAL DATA:  Subsequent treatment strategy for multiple myeloma. EXAM: NUCLEAR MEDICINE PET WHOLE BODY TECHNIQUE: 15.7 mCi F-18 FDG was injected intravenously. Full-ring PET imaging was performed from the head to foot after the radiotracer. CT data was obtained and used for attenuation correction and anatomic localization. Fasting blood glucose: 89 mg/dl COMPARISON:  None Available. FINDINGS: Mediastinal blood pool activity: SUV max 2.8 HEAD/NECK: No hypermetabolic activity in the scalp. No hypermetabolic cervical lymph nodes. Incidental CT findings: none CHEST: No hypermetabolic mediastinal or hilar nodes. No suspicious pulmonary nodules on the CT scan. Incidental CT findings: none ABDOMEN/PELVIS: No abnormal hypermetabolic activity within the liver, pancreas, adrenal glands, or spleen. No hypermetabolic lymph nodes in the abdomen or pelvis. Incidental CT findings: none SKELETON: No focal hypermetabolic activity to suggest skeletal metastasis. Incidental CT findings: No lytic lesions on CT portion exam. EXTREMITIES: No abnormal hypermetabolic activity in the lower extremities. Incidental CT findings: No lytic lesion on CT portion exam. IMPRESSION: 1. No evidence of active myeloma on skull base to thigh FDG PET scan. 2. No soft tissue plasmacytoma. 3. No lytic or suspicious lesion on CT portion exam. Electronically Signed   By: Suzy Bouchard M.D.   On:  08/15/2021 16:13   US ARTERIAL SEGMENTAL EXERCISE (USE FOR CLAUDICATION)  Result Date: 08/02/2021 CLINICAL DATA:  Decreased pedal pulses EXAM:  NONINVASIVE PHYSIOLOGIC VASCULAR STUDY OF BILATERAL LOWER EXTREMITIES WITH AND WITHOUT EXERCISE TECHNIQUE: Evaluation of both lower extremities were performed at rest, including calculation of ankle-brachial indices, multiple segmental pressure evaluation, segmental Doppler and segmental pulse volume recording. Ankle brachial indices were also obtained following treadmill exercise. FINDINGS: Right Lower Extremity Resting ABI:  1.53 Resting TBI: 1.100 Post Exercise ABI:1.08 Segmental Pressures: Normal segmental pressures, no significant (20 mmHg) pressure gradient between adjacent segments. Great toe pressure: 137 mm Hg Arterial Waveforms: Normal multiphasic arterial waveforms. PVRs: Normal PVRs with maintained waveform amplitude, augmentation and quality. Left Lower Extremity: Resting ABI: 1.38 Resting TBI: 0.82 Post Exercise ABI:1.06 Segmental Pressures: Normal segmental pressures, no significant (20 mmHg) pressure gradient between adjacent segments. Great toe pressure: 103 mm Hg Arterial Waveforms: Normal multiphasic arterial waveforms. PVRs: Normal PVRs with maintained waveform amplitude, augmentation and quality. Other: Symmetric upper extremity pressures. IMPRESSION: Evaluation of the right lower extremity is slightly limited due to decreased compressibility of the posterior tibial artery. However, there is no definitive evidence of significant lower extremity arterial occlusive disease. Electronically Signed   By: Miachel Roux M.D.   On: 08/02/2021 15:14   CT BONE MARROW BIOPSY & ASPIRATION  Result Date: 07/27/2021 INDICATION: Multiple myeloma EXAM: CT GUIDED BONE MARROW ASPIRATION AND CORE BIOPSY RADIATION DOSE REDUCTION: This exam was performed according to the departmental dose-optimization program which includes automated exposure control, adjustment of the mA and/or kV according to patient size and/or use of iterative reconstruction technique. MEDICATIONS: None. ANESTHESIA/SEDATION: Moderate  (conscious) sedation was employed during this procedure. A total of 1.5 milligrams versed and 75 micrograms fentanyl were administered intravenously. The patient's level of consciousness and vital signs were monitored continuously by radiology nursing throughout the procedure under my direct supervision. Total monitored sedation time: 12 minutes FLUOROSCOPY TIME:  CT dose in mGy was not provided. COMPLICATIONS: None immediate. Estimated blood loss: <5 mL PROCEDURE: Informed written consent was obtained from the patient after a thorough discussion of the procedural risks, benefits and alternatives. All questions were addressed. Maximal Sterile Barrier Technique was utilized including caps, mask, sterile gowns, sterile gloves, sterile drape, hand hygiene and skin antiseptic. A timeout was performed prior to the initiation of the procedure. The patient was positioned prone and non-contrast localization CT was performed of the pelvis to demonstrate the iliac marrow spaces. Maximal barrier sterile technique utilized including caps, mask, sterile gowns, sterile gloves, large sterile drape, hand hygiene, and chlorhexidine prep. Under sterile conditions and local anesthesia, an 11 gauge coaxial bone biopsy needle was advanced into the RIGHT iliac marrow space. Needle position was confirmed with CT imaging. Initially, bone marrow aspiration was performed. Next, the 11 gauge outer cannula was utilized to obtain a 2 iliac bone marrow core biopsy. Needle was removed. Hemostasis was obtained with compression. The patient tolerated the procedure well. Samples were prepared with the cytotechnologist. IMPRESSION: Successful CT-guided bone marrow aspiration and biopsy, as above. Michaelle Birks, MD [facility] Electronically Signed   By: Michaelle Birks M.D.   On: 07/27/2021 11:51

## 2021-10-19 NOTE — Assessment & Plan Note (Addendum)
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 Proceed with cycle 3  Dara-RVD Singualair 58m 1 day prior to daratumumab treatments, 2 days after treatment.  Discussed about patient about rationale of bone strengthening agents and potential side effects Recommend patient to obtain dental clearance.

## 2021-10-20 LAB — KAPPA/LAMBDA LIGHT CHAINS
Kappa free light chain: 15.3 mg/L (ref 3.3–19.4)
Kappa, lambda light chain ratio: 1.29 (ref 0.26–1.65)
Lambda free light chains: 11.9 mg/L (ref 5.7–26.3)

## 2021-10-24 ENCOUNTER — Other Ambulatory Visit: Payer: Self-pay

## 2021-10-24 LAB — MULTIPLE MYELOMA PANEL, SERUM
Albumin SerPl Elph-Mcnc: 3.2 g/dL (ref 2.9–4.4)
Albumin/Glob SerPl: 1.1 (ref 0.7–1.7)
Alpha 1: 0.3 g/dL (ref 0.0–0.4)
Alpha2 Glob SerPl Elph-Mcnc: 0.7 g/dL (ref 0.4–1.0)
B-Globulin SerPl Elph-Mcnc: 1.3 g/dL (ref 0.7–1.3)
Gamma Glob SerPl Elph-Mcnc: 0.7 g/dL (ref 0.4–1.8)
Globulin, Total: 3 g/dL (ref 2.2–3.9)
IgA: 218 mg/dL (ref 61–437)
IgG (Immunoglobin G), Serum: 810 mg/dL (ref 603–1613)
IgM (Immunoglobulin M), Srm: 78 mg/dL (ref 20–172)
Total Protein ELP: 6.2 g/dL (ref 6.0–8.5)

## 2021-10-25 ENCOUNTER — Other Ambulatory Visit: Payer: Self-pay | Admitting: Oncology

## 2021-10-25 ENCOUNTER — Other Ambulatory Visit: Payer: Self-pay | Admitting: Physician Assistant

## 2021-10-25 DIAGNOSIS — M0579 Rheumatoid arthritis with rheumatoid factor of multiple sites without organ or systems involvement: Secondary | ICD-10-CM

## 2021-10-25 DIAGNOSIS — C9000 Multiple myeloma not having achieved remission: Secondary | ICD-10-CM

## 2021-10-25 NOTE — Telephone Encounter (Signed)
Next Visit: 01/18/2022  Last Visit: 08/16/2021  Last Fill: 06/27/2021  DX: Rheumatoid arthritis with rheumatoid factor of multiple sites without organ or systems involvement  Current Dose per office note 08/16/2021: methotrexate 8  tablets by mouth once weekly   Labs: 10/19/2021 RBC 3.92, Hgb 11.9, Hct 37.3  Okay to refill MTX?

## 2021-10-26 ENCOUNTER — Inpatient Hospital Stay: Payer: Medicare Other

## 2021-10-26 VITALS — BP 121/61 | HR 80 | Temp 97.4°F | Resp 18 | Wt 320.9 lb

## 2021-10-26 DIAGNOSIS — Z79624 Long term (current) use of inhibitors of nucleotide synthesis: Secondary | ICD-10-CM | POA: Diagnosis not present

## 2021-10-26 DIAGNOSIS — Z79899 Other long term (current) drug therapy: Secondary | ICD-10-CM | POA: Diagnosis not present

## 2021-10-26 DIAGNOSIS — C9000 Multiple myeloma not having achieved remission: Secondary | ICD-10-CM

## 2021-10-26 DIAGNOSIS — T451X5A Adverse effect of antineoplastic and immunosuppressive drugs, initial encounter: Secondary | ICD-10-CM | POA: Diagnosis not present

## 2021-10-26 DIAGNOSIS — M069 Rheumatoid arthritis, unspecified: Secondary | ICD-10-CM | POA: Diagnosis not present

## 2021-10-26 DIAGNOSIS — Z7982 Long term (current) use of aspirin: Secondary | ICD-10-CM | POA: Diagnosis not present

## 2021-10-26 DIAGNOSIS — Z7961 Long term (current) use of immunomodulator: Secondary | ICD-10-CM | POA: Diagnosis not present

## 2021-10-26 DIAGNOSIS — D84821 Immunodeficiency due to drugs: Secondary | ICD-10-CM | POA: Diagnosis not present

## 2021-10-26 DIAGNOSIS — D6481 Anemia due to antineoplastic chemotherapy: Secondary | ICD-10-CM | POA: Diagnosis not present

## 2021-10-26 DIAGNOSIS — L02414 Cutaneous abscess of left upper limb: Secondary | ICD-10-CM | POA: Diagnosis not present

## 2021-10-26 DIAGNOSIS — Z5112 Encounter for antineoplastic immunotherapy: Secondary | ICD-10-CM | POA: Diagnosis not present

## 2021-10-26 LAB — CBC WITH DIFFERENTIAL/PLATELET
Abs Immature Granulocytes: 0.04 10*3/uL (ref 0.00–0.07)
Basophils Absolute: 0 10*3/uL (ref 0.0–0.1)
Basophils Relative: 1 %
Eosinophils Absolute: 0.5 10*3/uL (ref 0.0–0.5)
Eosinophils Relative: 7 %
HCT: 38.7 % — ABNORMAL LOW (ref 39.0–52.0)
Hemoglobin: 12.5 g/dL — ABNORMAL LOW (ref 13.0–17.0)
Immature Granulocytes: 1 %
Lymphocytes Relative: 20 %
Lymphs Abs: 1.6 10*3/uL (ref 0.7–4.0)
MCH: 30.2 pg (ref 26.0–34.0)
MCHC: 32.3 g/dL (ref 30.0–36.0)
MCV: 93.5 fL (ref 80.0–100.0)
Monocytes Absolute: 0.6 10*3/uL (ref 0.1–1.0)
Monocytes Relative: 7 %
Neutro Abs: 5.4 10*3/uL (ref 1.7–7.7)
Neutrophils Relative %: 64 %
Platelets: 245 10*3/uL (ref 150–400)
RBC: 4.14 MIL/uL — ABNORMAL LOW (ref 4.22–5.81)
RDW: 15.5 % (ref 11.5–15.5)
WBC: 8.2 10*3/uL (ref 4.0–10.5)
nRBC: 0 % (ref 0.0–0.2)

## 2021-10-26 LAB — COMPREHENSIVE METABOLIC PANEL
ALT: 7 U/L (ref 0–44)
AST: 13 U/L — ABNORMAL LOW (ref 15–41)
Albumin: 3.8 g/dL (ref 3.5–5.0)
Alkaline Phosphatase: 78 U/L (ref 38–126)
Anion gap: 4 — ABNORMAL LOW (ref 5–15)
BUN: 16 mg/dL (ref 8–23)
CO2: 25 mmol/L (ref 22–32)
Calcium: 8.8 mg/dL — ABNORMAL LOW (ref 8.9–10.3)
Chloride: 109 mmol/L (ref 98–111)
Creatinine, Ser: 0.93 mg/dL (ref 0.61–1.24)
GFR, Estimated: >60 mL/min (ref >60)
Glucose, Bld: 97 mg/dL (ref 70–99)
Potassium: 3.9 mmol/L (ref 3.5–5.1)
Sodium: 138 mmol/L (ref 135–145)
Total Bilirubin: 0.3 mg/dL (ref 0.3–1.2)
Total Protein: 7.1 g/dL (ref 6.5–8.1)

## 2021-10-26 MED ORDER — ACETAMINOPHEN 325 MG PO TABS
650.0000 mg | ORAL_TABLET | Freq: Once | ORAL | Status: AC
  Administered 2021-10-26: 650 mg via ORAL
  Filled 2021-10-26: qty 2

## 2021-10-26 MED ORDER — DARATUMUMAB-HYALURONIDASE-FIHJ 1800-30000 MG-UT/15ML ~~LOC~~ SOLN
1800.0000 mg | Freq: Once | SUBCUTANEOUS | Status: AC
  Administered 2021-10-26: 1800 mg via SUBCUTANEOUS
  Filled 2021-10-26: qty 15

## 2021-10-26 MED ORDER — BORTEZOMIB CHEMO SQ INJECTION 3.5 MG (2.5MG/ML)
1.3000 mg/m2 | Freq: Once | INTRAMUSCULAR | Status: AC
  Administered 2021-10-26: 3.5 mg via SUBCUTANEOUS
  Filled 2021-10-26: qty 1.4

## 2021-10-26 MED ORDER — DIPHENHYDRAMINE HCL 25 MG PO CAPS
50.0000 mg | ORAL_CAPSULE | Freq: Once | ORAL | Status: AC
  Administered 2021-10-26: 50 mg via ORAL
  Filled 2021-10-26: qty 2

## 2021-10-26 MED ORDER — DEXAMETHASONE 4 MG PO TABS
20.0000 mg | ORAL_TABLET | Freq: Once | ORAL | Status: AC
  Administered 2021-10-26: 20 mg via ORAL
  Filled 2021-10-26: qty 5

## 2021-10-26 NOTE — Patient Instructions (Signed)
[facility] AT [place]  Discharge Instructions: Thank you for choosing [facility] to provide your oncology and hematology care.  If you have a lab appointment with the [facility], please go directly to the [facility] and check in at the registration area.  Wear comfortable clothing and clothing appropriate for easy access to any Portacath or PICC line.   We strive to give you quality time with your provider. You may need to reschedule your appointment if you arrive late (15 or more minutes).  Arriving late affects you and other patients whose appointments are after yours.  Also, if you miss three or more appointments without notifying the office, you may be dismissed from the clinic at the provider's discretion.      For prescription refill requests, have your pharmacy contact our office and allow 72 hours for refills to be completed.    Today you received the following chemotherapy and/or immunotherapy agents Darzalex and Velcade       To help prevent nausea and vomiting after your treatment, we encourage you to take your nausea medication as directed.  BELOW ARE SYMPTOMS THAT SHOULD BE REPORTED IMMEDIATELY: *FEVER GREATER THAN 100.4 F (38 C) OR HIGHER *CHILLS OR SWEATING *NAUSEA AND VOMITING THAT IS NOT CONTROLLED WITH YOUR NAUSEA MEDICATION *UNUSUAL SHORTNESS OF BREATH *UNUSUAL BRUISING OR BLEEDING *URINARY PROBLEMS (pain or burning when urinating, or frequent urination) *BOWEL PROBLEMS (unusual diarrhea, constipation, pain near the anus) TENDERNESS IN MOUTH AND THROAT WITH OR WITHOUT PRESENCE OF ULCERS (sore throat, sores in mouth, or a toothache) UNUSUAL RASH, SWELLING OR PAIN  UNUSUAL VAGINAL DISCHARGE OR ITCHING   Items with * indicate a potential emergency and should be followed up as soon as possible or go to the Emergency Department if any problems should occur.  Please show the CHEMOTHERAPY ALERT CARD or IMMUNOTHERAPY ALERT CARD at  check-in to the Emergency Department and triage nurse.  Should you have questions after your visit or need to cancel or reschedule your appointment, please contact [facility] [place] AT [place]  [phone] and follow the prompts.  Office hours are 8:00 a.m. to 4:30 p.m. Monday - Friday. Please note that voicemails left after 4:00 p.m. may not be returned until the following business day.  We are closed weekends and major holidays. You have access to a nurse at all times for urgent questions. Please call the main number to the clinic [phone] and follow the prompts.  For any non-urgent questions, you may also contact your provider using MyChart. We now offer e-Visits for anyone 42 and older to request care online for non-urgent symptoms. For details visit [email or web address].   Also download the MyChart app! Go to the app store, search "MyChart", open the app, select , and log in with your MyChart username and password.  Masks are optional in the cancer centers. If you would like for your care team to wear a mask while they are taking care of you, please let them know. For doctor visits, patients may have with them one support person who is at least 69 years old. At this time, visitors are not allowed in the infusion area.

## 2021-10-28 ENCOUNTER — Other Ambulatory Visit: Payer: Self-pay | Admitting: *Deleted

## 2021-10-28 DIAGNOSIS — C9000 Multiple myeloma not having achieved remission: Secondary | ICD-10-CM

## 2021-10-29 ENCOUNTER — Encounter: Payer: Self-pay | Admitting: Oncology

## 2021-11-01 ENCOUNTER — Other Ambulatory Visit: Payer: Self-pay

## 2021-11-01 ENCOUNTER — Encounter: Payer: Self-pay | Admitting: Oncology

## 2021-11-01 MED ORDER — LENALIDOMIDE 25 MG PO CAPS: 25.0000 mg | ORAL_CAPSULE | Freq: Every day | ORAL | 0 refills | Status: DC

## 2021-11-02 ENCOUNTER — Inpatient Hospital Stay (HOSPITAL_BASED_OUTPATIENT_CLINIC_OR_DEPARTMENT_OTHER): Payer: Medicare Other | Admitting: Oncology

## 2021-11-02 ENCOUNTER — Encounter: Payer: Self-pay | Admitting: Oncology

## 2021-11-02 ENCOUNTER — Inpatient Hospital Stay: Payer: Medicare Other

## 2021-11-02 ENCOUNTER — Inpatient Hospital Stay: Payer: Medicare Other | Attending: Oncology

## 2021-11-02 ENCOUNTER — Ambulatory Visit
Admission: RE | Admit: 2021-11-02 | Discharge: 2021-11-02 | Disposition: A | Discharge status: Home / Self Care | Payer: Medicare Other | Source: Ambulatory Visit | Attending: Oncology | Admitting: Oncology

## 2021-11-02 VITALS — BP 113/63 | HR 59 | Temp 97.3°F | Resp 18

## 2021-11-02 VITALS — BP 123/71 | HR 75 | Temp 98.8°F | Resp 18 | Wt 326.9 lb

## 2021-11-02 DIAGNOSIS — Z7961 Long term (current) use of immunomodulator: Secondary | ICD-10-CM | POA: Insufficient documentation

## 2021-11-02 DIAGNOSIS — C9000 Multiple myeloma not having achieved remission: Secondary | ICD-10-CM | POA: Insufficient documentation

## 2021-11-02 DIAGNOSIS — G629 Polyneuropathy, unspecified: Secondary | ICD-10-CM | POA: Diagnosis not present

## 2021-11-02 DIAGNOSIS — Z87891 Personal history of nicotine dependence: Secondary | ICD-10-CM | POA: Insufficient documentation

## 2021-11-02 DIAGNOSIS — D649 Anemia, unspecified: Secondary | ICD-10-CM | POA: Diagnosis not present

## 2021-11-02 DIAGNOSIS — D6481 Anemia due to antineoplastic chemotherapy: Secondary | ICD-10-CM | POA: Insufficient documentation

## 2021-11-02 DIAGNOSIS — R21 Rash and other nonspecific skin eruption: Secondary | ICD-10-CM | POA: Insufficient documentation

## 2021-11-02 DIAGNOSIS — M7989 Other specified soft tissue disorders: Secondary | ICD-10-CM | POA: Diagnosis not present

## 2021-11-02 DIAGNOSIS — K521 Toxic gastroenteritis and colitis: Secondary | ICD-10-CM | POA: Insufficient documentation

## 2021-11-02 DIAGNOSIS — T451X5A Adverse effect of antineoplastic and immunosuppressive drugs, initial encounter: Secondary | ICD-10-CM | POA: Insufficient documentation

## 2021-11-02 DIAGNOSIS — Z79899 Other long term (current) drug therapy: Secondary | ICD-10-CM | POA: Diagnosis not present

## 2021-11-02 DIAGNOSIS — M069 Rheumatoid arthritis, unspecified: Secondary | ICD-10-CM | POA: Insufficient documentation

## 2021-11-02 DIAGNOSIS — Z7982 Long term (current) use of aspirin: Secondary | ICD-10-CM | POA: Diagnosis not present

## 2021-11-02 DIAGNOSIS — Z79624 Long term (current) use of inhibitors of nucleotide synthesis: Secondary | ICD-10-CM | POA: Diagnosis not present

## 2021-11-02 DIAGNOSIS — Z5112 Encounter for antineoplastic immunotherapy: Secondary | ICD-10-CM | POA: Diagnosis not present

## 2021-11-02 LAB — CBC WITH DIFFERENTIAL/PLATELET
Abs Immature Granulocytes: 0.07 10*3/uL (ref 0.00–0.07)
Basophils Absolute: 0.1 10*3/uL (ref 0.0–0.1)
Basophils Relative: 1 %
Eosinophils Absolute: 1.1 10*3/uL — ABNORMAL HIGH (ref 0.0–0.5)
Eosinophils Relative: 10 %
HCT: 38.4 % — ABNORMAL LOW (ref 39.0–52.0)
Hemoglobin: 12.3 g/dL — ABNORMAL LOW (ref 13.0–17.0)
Immature Granulocytes: 1 %
Lymphocytes Relative: 10 %
Lymphs Abs: 1.1 10*3/uL (ref 0.7–4.0)
MCH: 30.1 pg (ref 26.0–34.0)
MCHC: 32 g/dL (ref 30.0–36.0)
MCV: 93.9 fL (ref 80.0–100.0)
Monocytes Absolute: 1.3 10*3/uL — ABNORMAL HIGH (ref 0.1–1.0)
Monocytes Relative: 13 %
Neutro Abs: 6.8 10*3/uL (ref 1.7–7.7)
Neutrophils Relative %: 65 %
Platelets: 218 10*3/uL (ref 150–400)
RBC: 4.09 MIL/uL — ABNORMAL LOW (ref 4.22–5.81)
RDW: 15.6 % — ABNORMAL HIGH (ref 11.5–15.5)
Smear Review: NORMAL
WBC: 10.3 10*3/uL (ref 4.0–10.5)
nRBC: 0 % (ref 0.0–0.2)

## 2021-11-02 LAB — COMPREHENSIVE METABOLIC PANEL
ALT: 8 U/L (ref 0–44)
AST: 16 U/L (ref 15–41)
Albumin: 3.7 g/dL (ref 3.5–5.0)
Alkaline Phosphatase: 74 U/L (ref 38–126)
Anion gap: 6 (ref 5–15)
BUN: 13 mg/dL (ref 8–23)
CO2: 26 mmol/L (ref 22–32)
Calcium: 8.7 mg/dL — ABNORMAL LOW (ref 8.9–10.3)
Chloride: 106 mmol/L (ref 98–111)
Creatinine, Ser: 1.11 mg/dL (ref 0.61–1.24)
GFR, Estimated: >60 mL/min (ref >60)
Glucose, Bld: 95 mg/dL (ref 70–99)
Potassium: 4 mmol/L (ref 3.5–5.1)
Sodium: 138 mmol/L (ref 135–145)
Total Bilirubin: 0.3 mg/dL (ref 0.3–1.2)
Total Protein: 6.7 g/dL (ref 6.5–8.1)

## 2021-11-02 MED ORDER — ACETAMINOPHEN 325 MG PO TABS
650.0000 mg | ORAL_TABLET | Freq: Once | ORAL | Status: AC
  Administered 2021-11-02: 650 mg via ORAL
  Filled 2021-11-02: qty 2

## 2021-11-02 MED ORDER — DEXAMETHASONE 4 MG PO TABS
20.0000 mg | ORAL_TABLET | Freq: Once | ORAL | Status: AC
  Administered 2021-11-02: 20 mg via ORAL
  Filled 2021-11-02: qty 5

## 2021-11-02 MED ORDER — DARATUMUMAB-HYALURONIDASE-FIHJ 1800-30000 MG-UT/15ML ~~LOC~~ SOLN
1800.0000 mg | Freq: Once | SUBCUTANEOUS | Status: AC
  Administered 2021-11-02: 1800 mg via SUBCUTANEOUS
  Filled 2021-11-02: qty 15

## 2021-11-02 MED ORDER — GABAPENTIN 100 MG PO CAPS: 100.0000 mg | ORAL_CAPSULE | Freq: Every day | ORAL | 0 refills | Status: DC

## 2021-11-02 MED ORDER — HYDROCORTISONE 0.5 % EX CREA: 1.0000 | TOPICAL_CREAM | Freq: Three times a day (TID) | CUTANEOUS | 1 refills | Status: DC

## 2021-11-02 MED ORDER — BORTEZOMIB CHEMO SQ INJECTION 3.5 MG (2.5MG/ML)
1.3000 mg/m2 | Freq: Once | INTRAMUSCULAR | Status: AC
  Administered 2021-11-02: 3.5 mg via SUBCUTANEOUS
  Filled 2021-11-02: qty 1.4

## 2021-11-02 MED ORDER — DIPHENHYDRAMINE HCL 25 MG PO CAPS
50.0000 mg | ORAL_CAPSULE | Freq: Once | ORAL | Status: AC
  Administered 2021-11-02: 50 mg via ORAL
  Filled 2021-11-02: qty 2

## 2021-11-02 NOTE — Assessment & Plan Note (Signed)
Bilateral lower extremity swelling, rule out DVT check ultrasound lower extremities bilaterally start.  If negative I will check echocardiogram and start patient on a trial of diuretics

## 2021-11-02 NOTE — Assessment & Plan Note (Signed)
follow up with rheumatology.  Currently he is off Humira and methotrexate.  I agree with holding the treatment to avoid synergistic bone marrow suppression.  Symptom has been stable.

## 2021-11-02 NOTE — Assessment & Plan Note (Signed)
secondary to chemotherapy, and chronic disease.  Stable.

## 2021-11-02 NOTE — Assessment & Plan Note (Signed)
Encourage oral hydration. Recommend Imodium as needed.  Declines

## 2021-11-02 NOTE — Patient Instructions (Signed)
[facility] CANCER CTR AT [place]  Discharge Instructions: Thank you for choosing [facility] to provide your oncology and hematology care.  If you have a lab appointment with the [facility], please go directly to the [facility] and check in at the registration area.  Wear comfortable clothing and clothing appropriate for easy access to any Portacath or PICC line.   We strive to give you quality time with your provider. You may need to reschedule your appointment if you arrive late (15 or more minutes).  Arriving late affects you and other patients whose appointments are after yours.  Also, if you miss three or more appointments without notifying the office, you may be dismissed from the clinic at the provider's discretion.      For prescription refill requests, have your pharmacy contact our office and allow 72 hours for refills to be completed.    Today you received the following chemotherapy and/or immunotherapy agents Darzalex and Velcade      To help prevent nausea and vomiting after your treatment, we encourage you to take your nausea medication as directed.  BELOW ARE SYMPTOMS THAT SHOULD BE REPORTED IMMEDIATELY: *FEVER GREATER THAN 100.4 F (38 C) OR HIGHER *CHILLS OR SWEATING *NAUSEA AND VOMITING THAT IS NOT CONTROLLED WITH YOUR NAUSEA MEDICATION *UNUSUAL SHORTNESS OF BREATH *UNUSUAL BRUISING OR BLEEDING *URINARY PROBLEMS (pain or burning when urinating, or frequent urination) *BOWEL PROBLEMS (unusual diarrhea, constipation, pain near the anus) TENDERNESS IN MOUTH AND THROAT WITH OR WITHOUT PRESENCE OF ULCERS (sore throat, sores in mouth, or a toothache) UNUSUAL RASH, SWELLING OR PAIN  UNUSUAL VAGINAL DISCHARGE OR ITCHING   Items with * indicate a potential emergency and should be followed up as soon as possible or go to the Emergency Department if any problems should occur.  Please show the CHEMOTHERAPY ALERT CARD or IMMUNOTHERAPY ALERT CARD at  check-in to the Emergency Department and triage nurse.  Should you have questions after your visit or need to cancel or reschedule your appointment, please contact [facility] CANCER [place] AT [place]  [phone] and follow the prompts.  Office hours are 8:00 a.m. to 4:30 p.m. Monday - Friday. Please note that voicemails left after 4:00 p.m. may not be returned until the following business day.  We are closed weekends and major holidays. You have access to a nurse at all times for urgent questions. Please call the main number to the clinic [phone] and follow the prompts.  For any non-urgent questions, you may also contact your provider using MyChart. We now offer e-Visits for anyone 69 and older to request care online for non-urgent symptoms. For details visit [email or web address].   Also download the MyChart app! Go to the app store, search "MyChart", open the app, select [facility], and log in with your MyChart username and password.  Masks are optional in the cancer centers. If you would like for your care team to wear a mask while they are taking care of you, please let them know. For doctor visits, patients may have with them one support person who is at least 69 years old. At this time, visitors are not allowed in the infusion area.

## 2021-11-02 NOTE — Assessment & Plan Note (Signed)
secondary to carpal tunnel disease and chemotherapy Slightly worse.  Grade 1/2 Recommend gabapentin 100 mg QHS Rationale and potential side effects were reviewed and discussed with patient.  He agrees with the plan.

## 2021-11-02 NOTE — Progress Notes (Addendum)
Hematology/Oncology Progress note [phone] [phone]      Patient Care Team: Seward Carol, MD as PCP - General (Internal Medicine)  ASSESSMENT & PLAN:   Cancer Staging  Multiple myeloma not having achieved remission [facility]) Staging form: Plasma Cell Myeloma and Plasma Cell Disorders, AJCC 8th Edition - Clinical stage from 04/22/2020: RISS Stage II (Beta-2-microglobulin (mg/L): 2.7, Albumin (g/dL): 3.1, ISS: Stage II, High-risk cytogenetics: Absent, LDH: Normal) - Signed by Earlie Server, MD on 08/10/2021   Multiple myeloma not having achieved remission ([employer]) IgA lambda multiple myeloma, 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 Proceed with cycle 3 day 15 Velcade Dara. Singualair 36m 1 day prior to daratumumab treatments, 2 days after treatment.  Discussed about patient about rationale of bone strengthening agents and potential side effects Recommend patient to obtain dental clearance. [facility].  [place]heumatoid [place] with [place]ology.  Currently he is off Humira and methotrexate.  I agree with holding the treatment to avoid synergistic bone marrow suppression.  Symptom has been stable.  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  Grade 1/2 Recommend gabapentin 100 mg QHS Rationale and potential side effects were reviewed and discussed with patient.  He agrees with the plan.  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  Stable.  Leg swelling Bilateral lower extremity swelling, rule out DVT check ultrasound lower extremities bilaterally start.  If negative I will check echocardiogram and start patient on a trial of diuretics  Chemotherapy induced diarrhea Encourage oral hydration. Recommend Imodium as needed.  Declines  Skin rash, recommend topical hydrocortisone  cream.   Orders Placed This Encounter  Procedures   [place]ous Img Lower Bilateral    Standing Status:   Future    Number of Occurrences:   1    Standing Expiration Date:   11/02/2022    Order Specific Question:   Reason for Exam (SYMPTOM  OR DIAGNOSIS REQUIRED)    Answer:   leg swelling    Order Specific Question:   Preferred imaging location?    Answer:   [facility]    Order Specific Question:   Call Results- Best Contact Number?    Answer:   [phone] not hold   Ambulatory referral to Hematology / Oncology    Referral Priority:   Routine    Referral Type:   Consultation    Referral Reason:   Specialty Services Required    Requested Specialty:   Oncology    Number of Visits Requested:   1    Follow up  1 week lab MD Dara-Velcade Dex    All questions were answered. The patient knows to call the clinic with any problems, questions or concerns.  ZEarlie Server MD, PhD [facility] Oncology 11/02/2021    CHIEF COMPLAINTS/REASON FOR VISIT:  Follow-up for multiple myeloma  HISTORY OF PRESENTING ILLNESS:  Eric SOTHis a 71y.o. male who was seen in consultation at the request of PSeward Carol MD for evaluation of abnormal SPEP results, anemia. .   Patient recently had work up done at rheumatologist's office and was diagnosed with rheumatoid arthritis and patient was recommended to start MTX. He has not started yet Labs reviewed,  03/04/20 SPEP showed abnormal protein band of 1.7g/dl.,   , and IFE showed IgA Lamda monoclonal protein.  He has had weight loss last year, no weight loss  and some weight gain during the past 6 months.   Multiple join pain.   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  27% plasma cell, cytogenetics - normal, and myeloma FISH panel is negative.  Standard risk.  [place]ongo red staining was added and was negative. normal kidney  function, calcium level, hemoglobin above 10 and no bone lesions. albumin is decreased at 3.1, slightly elevated beta microglobulin.  Stage II, anemia with hemoglobin above 10.  # 07/27/2021, bone marrow biopsy showed hypercellular marrow involved by plasma cell myeloma, CD138 immunohistochemistry plasma cells compromise approximately 70% of the cellular elements and are lambda restricted by light chain in situ hybridization.  Cytogenetics showed duplication of 1q.  Cytogenetics is normal.  For rheumatoid arthritis, patient was seen by Dr. Estanislado Pandy recently and is currently off methotrexate and Humira.  08/15/2021, PET scan showed no evidence of active myeloma on skull base to thigh FDG PET scan.  No soft tissue plasmacytoma.  No lytic or suspicious lesion on CT portion of examination  INTERVAL HISTORY Eric Cruz is a 71 y.o. male who has above history reviewed by me today presents for follow up visit for management of multiple myeloma + Bilateral lower extremity swelling.+ Weight gain No shortness of breath, fever, nausea vomiting.  He has experienced intermittent diarrhea, per patient always happens on Saturdays.  Usually for loose bowel movements.  Symptoms spontaneously resolves. + Decreased urination, no dysuria, urgency.  Review of Systems  Constitutional:  Positive for fatigue. Negative for appetite change, chills, fever and unexpected weight change.  HENT:   Negative for hearing loss and voice change.   Eyes:  Negative for eye problems and icterus.  Respiratory:  Negative for chest tightness, cough and shortness of breath.   Cardiovascular:  Negative for chest pain and leg swelling.  Gastrointestinal:  Negative for abdominal distention and abdominal pain.  Endocrine: Negative for hot flashes.  Genitourinary:  Negative for difficulty urinating, dysuria and frequency.   Musculoskeletal:  Positive for arthralgias and back pain.  Skin:  Negative for itching and rash.   Neurological:  Negative for light-headedness and numbness.  Hematological:  Negative for adenopathy. Does not bruise/bleed easily.  Psychiatric/Behavioral:  Negative for confusion.      MEDICAL HISTORY:  Past Medical History:  Diagnosis Date   Arthritis    BPH (benign prostatic hyperplasia)    Cancer (HCC)    Prostate: states he had a positive biopsy but had another one a year later and it was gone.    Depression    Dizziness    GERD (gastroesophageal reflux disease)    Gout    High cholesterol    Hypertension    Sleep apnea    has cpap - not currently wearing   Varicose veins     SURGICAL HISTORY: Past Surgical History:  Procedure Laterality Date   ANTERIOR CERVICAL DECOMP/DISCECTOMY FUSION N/A 02/21/2018   Procedure: Cervical Five-Six Cervical Six-Seven Anterior cervical decompression/discectomy/fusion;  Surgeon: Ashok Pall, MD;  Location: [facility];  Service: Neurosurgery;  Laterality: N/A;  Cervical Five-Six Cervical Six-Seven Anterior cervical decompression/discectomy/fusion   BIOPSY  10/09/2019   Procedure: BIOPSY;  Surgeon: Ronnette Juniper, MD;  Location: [facility];  Service: Gastroenterology;;   BLADDER SURGERY     CARPAL TUNNEL RELEASE Right 02/11/2019   Procedure: Right Carpal Tunnel Wound Irrigation;  Surgeon: Ashok Pall, MD;  Location: [facility];  Service: Neurosurgery;  Laterality:  Right;  Right Carpal tunnel wound exploration/wash out   CARPAL TUNNEL RELEASE Bilateral    CHOLECYSTECTOMY  11/22/2012   CHOLECYSTECTOMY  11/22/2012   Procedure: LAPAROSCOPIC CHOLECYSTECTOMY;  Surgeon: Gwenyth Ober, MD;  Location: [facility];  Service: General;;   COLONOSCOPY     COLONOSCOPY WITH PROPOFOL N/A 10/09/2019   Procedure: COLONOSCOPY WITH PROPOFOL;  Surgeon: Ronnette Juniper, MD;  Location: [facility];  Service: Gastroenterology;  Laterality: N/A;   ESOPHAGOGASTRODUODENOSCOPY (EGD) WITH PROPOFOL N/A 10/09/2019   Procedure: ESOPHAGOGASTRODUODENOSCOPY (EGD) WITH PROPOFOL;  Surgeon: Ronnette Juniper, MD;  Location: [facility];  Service: Gastroenterology;  Laterality: N/A;   POLYPECTOMY  10/09/2019   Procedure: POLYPECTOMY;  Surgeon: Ronnette Juniper, MD;  Location: [facility];  Service: Gastroenterology;;    SOCIAL HISTORY: Social History   Socioeconomic History   Marital status: Married    Spouse name: Not on file   Number of children: Not on file   Years of education: Not on file   Highest education level: Not on file  Occupational History   Not on file  Tobacco Use   Smoking status: Former    Packs/day: 1.00    Years: 31.00    Total pack years: 31.00    Types: Cigarettes    Quit date: 04/12/1998    Years since quitting: 23.5    Passive exposure: Never   Smokeless tobacco: Never  Vaping Use   Vaping Use: Never used  Substance and Sexual Activity   Alcohol use: No   Drug use: No   Sexual activity: Not on file  Other Topics Concern   Not on file  Social History Narrative   Not on file   Social Determinants of Health   Financial Resource Strain: Not on file  Food Insecurity: Not on file  Transportation Needs: Not on file  Physical Activity: Not on file  Stress: Not on file  Social Connections: Not on file  Intimate Partner Violence: Not on file    FAMILY HISTORY: Family History  Problem Relation Age of Onset   Hypertension Mother    Diabetes Mother    Multiple myeloma Mother    Hypertension Father    Diabetes Father    Healthy Son    Healthy Daughter     ALLERGIES:  is allergic to atorvastatin, simvastatin, other, and statins.  MEDICATIONS:  Current Outpatient Medications  Medication Sig Dispense Refill   acyclovir (ZOVIRAX) 400 MG tablet Take 1 tablet (400 mg total) by mouth 2 (two) times daily. 60 tablet 5   aspirin EC 81 MG tablet Take 1 tablet (81 mg total) by mouth daily. 100 tablet 3   ferrous sulfate 325 (65 FE) MG EC tablet Take 325 mg by mouth daily with breakfast.     finasteride (PROSCAR) 5 MG tablet Take 5 mg by mouth daily.      folic acid (FOLVITE) 1 MG tablet Take 2 tablets (2 mg total) by mouth daily. 180 tablet 3   gabapentin (NEURONTIN) 100 MG capsule Take 1 capsule (100 mg total) by mouth at bedtime. 30 capsule 0   hydrocortisone cream 0.5 % Apply 1 Application topically 3 (three) times daily. Apply small amount to affected area 3 times daily until healed 56 g 1   lenalidomide (REVLIMID) 25 MG capsule Take 1 capsule (25 mg total) by mouth daily. Take for 14 days on, 7 days off, repeat every 21 days. 14 capsule 0   methotrexate (RHEUMATREX) 2.5 MG tablet TAKE 8 TABLETS BY MOUTH ONCE  WEEKLY . CAUTION: CHEMOTHERAPY.  PROTECTION FROM LIGHT 96 tablet 0   montelukast (SINGULAIR) 10 MG tablet Take 1 tablet (10 mg total) by mouth See admin instructions. Singualair 1 tab on the day prior to daratumumab treatments, 1 tab daily for 2 days after treatment. Daratumumab treatment are once a week 30 tablet 0   mupirocin cream (BACTROBAN) 2 % Apply 1 Application topically 2 (two) times daily. 30 g 0   tamsulosin (FLOMAX) 0.4 MG CAPS capsule Take 0.4 mg by mouth daily after supper.     Adalimumab (HUMIRA PEN) 40 MG/0.4ML PNKT Inject 40 mg into the skin every 14 (fourteen) days. (Patient not taking: Reported on 08/24/2021) 2 each 2   ondansetron (ZOFRAN) 8 MG tablet TAKE 1 TABLET BY MOUTH TWICE  DAILY AS NEEDED FOR NAUSEA OR  VOMITING (Patient not taking: Reported on 11/02/2021) 60 tablet 1   prochlorperazine (COMPAZINE) 10 MG tablet TAKE 1 TABLET BY MOUTH EVERY 6  HOURS AS NEEDED FOR NAUSEA AND  VOMITING (Patient not taking: Reported on 11/02/2021) 30 tablet 1   No current facility-administered medications for this visit.     PHYSICAL EXAMINATION: ECOG PERFORMANCE STATUS: 1 - Symptomatic but completely ambulatory Vitals:   11/02/21 0846  BP: 123/71  Pulse: 75  Resp: 18  Temp: 98.8 F (37.1 C)   Filed Weights   11/02/21 0846  Weight: (!) 326 lb 14.4 oz (148.3 kg)    Physical Exam Constitutional:      General: He is not in  acute distress.    Appearance: He is obese.     Comments: Patient walks with a cane.  HENT:     Head: Normocephalic and atraumatic.  Eyes:     General: No scleral icterus. Cardiovascular:     Rate and Rhythm: Normal rate and regular rhythm.     Heart sounds: Normal heart sounds.  Pulmonary:     Effort: Pulmonary effort is normal. No respiratory distress.     Breath sounds: No wheezing.  Abdominal:     General: Bowel sounds are normal. There is no distension.     Palpations: Abdomen is soft.  Musculoskeletal:        General: No deformity. Normal range of motion.     Cervical back: Normal range of motion and neck supple.     Comments: Trace edema, bilateral low extremities.   Skin:    General: Skin is warm and dry.     Findings: No erythema or rash.  Neurological:     Mental Status: He is alert and oriented to person, place, and time. Mental status is at baseline.     Cranial Nerves: No cranial nerve deficit.     Coordination: Coordination normal.  Psychiatric:        Mood and Affect: Mood normal.      LABORATORY DATA:  I have reviewed the data as listed Lab Results  Component Value Date   WBC 10.3 11/02/2021   HGB 12.3 (L) 11/02/2021   HCT 38.4 (L) 11/02/2021   MCV 93.9 11/02/2021   PLT 218 11/02/2021   Recent Labs    10/19/21 0834 10/26/21 0824 11/02/21 0822  NA 138 138 138  K 4.0 3.9 4.0  CL 108 109 106  CO2 '24 25 26  ' GLUCOSE 98 97 95  BUN '15 16 13  ' CREATININE 0.90 0.93 1.11  CALCIUM 9.0 8.8* 8.7*  GFRNONAA >60 >60 >60  PROT 6.8 7.1 6.7  ALBUMIN 3.5 3.8 3.7  AST 15 13*  16  ALT '7 7 8  ' ALKPHOS 82 78 74  BILITOT 0.4 0.3 0.3    Iron/TIBC/Ferritin/ %Sat    Component Value Date/Time   IRON 78 04/05/2021 1008   TIBC 274 04/05/2021 1008   FERRITIN 425 (H) 04/05/2021 1008   IRONPCTSAT 28 04/05/2021 1008      RADIOGRAPHIC STUDIES: I have personally reviewed the radiological images as listed and agreed with the findings in the report.  NM PET Image  Restage (PS) Whole Body  Result Date: 08/15/2021 CLINICAL DATA:  Subsequent treatment strategy for multiple myeloma. EXAM: NUCLEAR MEDICINE PET WHOLE BODY TECHNIQUE: 15.7 mCi F-18 FDG was injected intravenously. Full-ring PET imaging was performed from the head to foot after the radiotracer. CT data was obtained and used for attenuation correction and anatomic localization. Fasting blood glucose: 89 mg/dl COMPARISON:  None Available. FINDINGS: Mediastinal blood pool activity: SUV max 2.8 HEAD/NECK: No hypermetabolic activity in the scalp. No hypermetabolic cervical lymph nodes. Incidental CT findings: none CHEST: No hypermetabolic mediastinal or hilar nodes. No suspicious pulmonary nodules on the CT scan. Incidental CT findings: none ABDOMEN/PELVIS: No abnormal hypermetabolic activity within the liver, pancreas, adrenal glands, or spleen. No hypermetabolic lymph nodes in the abdomen or pelvis. Incidental CT findings: none SKELETON: No focal hypermetabolic activity to suggest skeletal metastasis. Incidental CT findings: No lytic lesions on CT portion exam. EXTREMITIES: No abnormal hypermetabolic activity in the lower extremities. Incidental CT findings: No lytic lesion on CT portion exam. IMPRESSION: 1. No evidence of active myeloma on skull base to thigh FDG PET scan. 2. No soft tissue plasmacytoma. 3. No lytic or suspicious lesion on CT portion exam. Electronically Signed   By: Suzy Bouchard M.D.   On: 08/15/2021 16:13

## 2021-11-02 NOTE — Progress Notes (Signed)
Patient here for follow up. Pt reports swelling to both feet and legs, decreased urination and rash to back and chest.

## 2021-11-02 NOTE — Assessment & Plan Note (Addendum)
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 Proceed with cycle 3 day 15 Velcade Dara. Singualair 27m 1 day prior to daratumumab treatments, 2 days after treatment.  Discussed about patient about rationale of bone strengthening agents and potential side effects Recommend patient to obtain dental clearance. [facility]er to [facility] for evaluation.

## 2021-11-04 ENCOUNTER — Telehealth: Payer: Self-pay

## 2021-11-04 ENCOUNTER — Other Ambulatory Visit: Payer: Self-pay | Admitting: Oncology

## 2021-11-04 ENCOUNTER — Telehealth: Payer: Self-pay | Admitting: Oncology

## 2021-11-04 ENCOUNTER — Other Ambulatory Visit: Payer: Self-pay

## 2021-11-04 DIAGNOSIS — C9000 Multiple myeloma not having achieved remission: Secondary | ICD-10-CM

## 2021-11-04 DIAGNOSIS — M7989 Other specified soft tissue disorders: Secondary | ICD-10-CM

## 2021-11-04 NOTE — Telephone Encounter (Signed)
-----   Message from Earlie Server, MD sent at 11/03/2021 11:52 PM EDT ----- Let him know that [place] leg showed no DVT.  Recommend to obtain 2D Echo - bilateral leg edema.

## 2021-11-04 NOTE — Telephone Encounter (Signed)
Called pt with appt information for Echo!

## 2021-11-04 NOTE — Telephone Encounter (Signed)
Called and left patient detailed VM informing him of [place] results and Dr. Collie Siad recommendation to have 2D echo done. Advised to call back with any questions or concerns.     Eric Cruz, please schedule patient for: 2D echo for bilateral edema. Please notify patient of appt.

## 2021-11-07 ENCOUNTER — Encounter: Payer: Self-pay | Admitting: Oncology

## 2021-11-09 ENCOUNTER — Inpatient Hospital Stay: Payer: Medicare Other

## 2021-11-09 ENCOUNTER — Encounter: Payer: Self-pay | Admitting: Oncology

## 2021-11-09 ENCOUNTER — Inpatient Hospital Stay (HOSPITAL_BASED_OUTPATIENT_CLINIC_OR_DEPARTMENT_OTHER): Payer: Medicare Other | Admitting: Oncology

## 2021-11-09 VITALS — BP 131/68 | HR 63 | Temp 98.5°F | Wt 326.0 lb

## 2021-11-09 DIAGNOSIS — Z7982 Long term (current) use of aspirin: Secondary | ICD-10-CM | POA: Diagnosis not present

## 2021-11-09 DIAGNOSIS — C9000 Multiple myeloma not having achieved remission: Secondary | ICD-10-CM | POA: Diagnosis not present

## 2021-11-09 DIAGNOSIS — Z5111 Encounter for antineoplastic chemotherapy: Secondary | ICD-10-CM | POA: Diagnosis not present

## 2021-11-09 DIAGNOSIS — D6481 Anemia due to antineoplastic chemotherapy: Secondary | ICD-10-CM | POA: Diagnosis not present

## 2021-11-09 DIAGNOSIS — G629 Polyneuropathy, unspecified: Secondary | ICD-10-CM | POA: Diagnosis not present

## 2021-11-09 DIAGNOSIS — T451X5A Adverse effect of antineoplastic and immunosuppressive drugs, initial encounter: Secondary | ICD-10-CM | POA: Diagnosis not present

## 2021-11-09 DIAGNOSIS — K521 Toxic gastroenteritis and colitis: Secondary | ICD-10-CM | POA: Diagnosis not present

## 2021-11-09 DIAGNOSIS — D649 Anemia, unspecified: Secondary | ICD-10-CM

## 2021-11-09 DIAGNOSIS — M7989 Other specified soft tissue disorders: Secondary | ICD-10-CM | POA: Diagnosis not present

## 2021-11-09 DIAGNOSIS — M069 Rheumatoid arthritis, unspecified: Secondary | ICD-10-CM

## 2021-11-09 DIAGNOSIS — Z79624 Long term (current) use of inhibitors of nucleotide synthesis: Secondary | ICD-10-CM | POA: Diagnosis not present

## 2021-11-09 DIAGNOSIS — Z79899 Other long term (current) drug therapy: Secondary | ICD-10-CM | POA: Diagnosis not present

## 2021-11-09 DIAGNOSIS — Z5112 Encounter for antineoplastic immunotherapy: Secondary | ICD-10-CM | POA: Diagnosis not present

## 2021-11-09 DIAGNOSIS — Z87891 Personal history of nicotine dependence: Secondary | ICD-10-CM | POA: Diagnosis not present

## 2021-11-09 DIAGNOSIS — R21 Rash and other nonspecific skin eruption: Secondary | ICD-10-CM | POA: Diagnosis not present

## 2021-11-09 DIAGNOSIS — Z7961 Long term (current) use of immunomodulator: Secondary | ICD-10-CM | POA: Diagnosis not present

## 2021-11-09 LAB — COMPREHENSIVE METABOLIC PANEL WITH GFR
ALT: 9 U/L (ref 0–44)
AST: 13 U/L — ABNORMAL LOW (ref 15–41)
Albumin: 3.7 g/dL (ref 3.5–5.0)
Alkaline Phosphatase: 79 U/L (ref 38–126)
Anion gap: 6 (ref 5–15)
BUN: 13 mg/dL (ref 8–23)
CO2: 24 mmol/L (ref 22–32)
Calcium: 9 mg/dL (ref 8.9–10.3)
Chloride: 108 mmol/L (ref 98–111)
Creatinine, Ser: 0.97 mg/dL (ref 0.61–1.24)
GFR, Estimated: >60 mL/min
Glucose, Bld: 95 mg/dL (ref 70–99)
Potassium: 4.3 mmol/L (ref 3.5–5.1)
Sodium: 138 mmol/L (ref 135–145)
Total Bilirubin: 0.3 mg/dL (ref 0.3–1.2)
Total Protein: 6.7 g/dL (ref 6.5–8.1)

## 2021-11-09 LAB — CBC WITH DIFFERENTIAL/PLATELET
Abs Immature Granulocytes: 0.02 K/uL (ref 0.00–0.07)
Basophils Absolute: 0.1 K/uL (ref 0.0–0.1)
Basophils Relative: 1 %
Eosinophils Absolute: 1.1 K/uL — ABNORMAL HIGH (ref 0.0–0.5)
Eosinophils Relative: 12 %
HCT: 39.5 % (ref 39.0–52.0)
Hemoglobin: 12.7 g/dL — ABNORMAL LOW (ref 13.0–17.0)
Immature Granulocytes: 0 %
Lymphocytes Relative: 20 %
Lymphs Abs: 1.8 K/uL (ref 0.7–4.0)
MCH: 30.4 pg (ref 26.0–34.0)
MCHC: 32.2 g/dL (ref 30.0–36.0)
MCV: 94.5 fL (ref 80.0–100.0)
Monocytes Absolute: 1.2 K/uL — ABNORMAL HIGH (ref 0.1–1.0)
Monocytes Relative: 13 %
Neutro Abs: 4.8 K/uL (ref 1.7–7.7)
Neutrophils Relative %: 54 %
Platelets: 187 K/uL (ref 150–400)
RBC: 4.18 MIL/uL — ABNORMAL LOW (ref 4.22–5.81)
RDW: 15.6 % — ABNORMAL HIGH (ref 11.5–15.5)
WBC: 8.9 K/uL (ref 4.0–10.5)
nRBC: 0 % (ref 0.0–0.2)

## 2021-11-09 MED ORDER — DEXAMETHASONE 4 MG PO TABS
ORAL_TABLET | ORAL | Status: AC
  Filled 2021-11-09: qty 5

## 2021-11-09 MED ORDER — DIPHENHYDRAMINE HCL 25 MG PO CAPS
50.0000 mg | ORAL_CAPSULE | Freq: Once | ORAL | Status: AC
  Administered 2021-11-09: 50 mg via ORAL

## 2021-11-09 MED ORDER — DIPHENHYDRAMINE HCL 25 MG PO CAPS
ORAL_CAPSULE | ORAL | Status: AC
  Filled 2021-11-09: qty 2

## 2021-11-09 MED ORDER — DARATUMUMAB-HYALURONIDASE-FIHJ 1800-30000 MG-UT/15ML ~~LOC~~ SOLN
1800.0000 mg | Freq: Once | SUBCUTANEOUS | Status: AC
  Administered 2021-11-09: 1800 mg via SUBCUTANEOUS
  Filled 2021-11-09: qty 15

## 2021-11-09 MED ORDER — BORTEZOMIB CHEMO SQ INJECTION 3.5 MG (2.5MG/ML)
1.3000 mg/m2 | Freq: Once | INTRAMUSCULAR | Status: AC
  Administered 2021-11-09: 3.5 mg via SUBCUTANEOUS
  Filled 2021-11-09: qty 1.4

## 2021-11-09 MED ORDER — DEXAMETHASONE 4 MG PO TABS
20.0000 mg | ORAL_TABLET | Freq: Once | ORAL | Status: AC
  Administered 2021-11-09: 20 mg via ORAL

## 2021-11-09 MED ORDER — ACETAMINOPHEN 325 MG PO TABS
650.0000 mg | ORAL_TABLET | Freq: Once | ORAL | Status: AC
  Administered 2021-11-09: 650 mg via ORAL

## 2021-11-09 MED ORDER — ACETAMINOPHEN 325 MG PO TABS
ORAL_TABLET | ORAL | Status: AC
  Filled 2021-11-09: qty 2

## 2021-11-09 NOTE — Assessment & Plan Note (Signed)
secondary to carpal tunnel disease and chemotherapy Slightly worse.  Grade 1/2 Recommend gabapentin 100 mg QHS

## 2021-11-09 NOTE — Assessment & Plan Note (Signed)
follow up with rheumatology.  Currently he is off Humira and methotrexate.  I agree with holding the treatment to avoid synergistic bone marrow suppression.  Symptom has been stable.

## 2021-11-09 NOTE — Progress Notes (Signed)
Hematology/Oncology Progress note [phone] [phone]      Patient Care Team: Seward Carol, MD as PCP - General (Internal Medicine)  ASSESSMENT & PLAN:   Cancer Staging  Multiple myeloma not having achieved remission [facility]) Staging form: Plasma Cell Myeloma and Plasma Cell Disorders, AJCC 8th Edition - Clinical stage from 04/22/2020: RISS Stage II (Beta-2-microglobulin (mg/L): 2.7, Albumin (g/dL): 3.1, ISS: Stage II, High-risk cytogenetics: Absent, LDH: Normal) - Signed by Earlie Server, MD on 08/10/2021   Multiple myeloma not having achieved remission ([place]) IgA lambda multiple myeloma, 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 Proceed with cycle 4 day 1 Velcade Dara. Take Revlimid D1-14 Singualair 55m 1 day prior to daratumumab treatments, 2 days after treatment.   Discussed about patient about rationale of bone strengthening agents and potential side effects Recommend patient to obtain dental clearance.  [place] [facility] for [place].  Encounter for antineoplastic chemotherapy Chemotherapy plan as listed above.  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  Stable.  Rheumatoid arthritis ([place] follow up with rheumatology.  Currently he is off Humira and methotrexate.  I agree with holding the treatment to avoid synergistic bone marrow suppression.  Symptom has been stable.  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  Grade 1/2 Recommend gabapentin 100 mg QHS   Orders Placed This Encounter  Procedures   CBC with Differential    Standing Status:   Future    Standing Expiration Date:   11/17/2022   Comprehensive metabolic panel    Standing Status:   Future    Standing Expiration Date:   11/17/2022   Comprehensive metabolic panel    Standing Status:   Future    Standing Expiration Date:   11/23/2022   CBC with  Differential    Standing Status:   Future    Standing Expiration Date:   11/24/2022   Kappa/Lambda Light Chains, Free, With Ratio, 24Hr. Urine    Standing Status:   Future    Standing Expiration Date:   11/30/2022   Multiple Myeloma Panel (SPEP&IFE w/QIG)    Standing Status:   Future    Standing Expiration Date:   11/30/2022   Comprehensive metabolic panel    Standing Status:   Future    Standing Expiration Date:   11/30/2022   CBC with Differential    Standing Status:   Future    Standing Expiration Date:   12/01/2022   Comprehensive metabolic panel    Standing Status:   Future    Standing Expiration Date:   12/07/2022   CBC with Differential    Standing Status:   Future    Standing Expiration Date:   12/08/2022   Comprehensive metabolic panel    Standing Status:   Future    Standing Expiration Date:   12/08/2022   Comprehensive metabolic panel    Standing Status:   Future    Standing Expiration Date:   12/14/2022   CBC with Differential    Standing Status:   Future    Standing Expiration Date:   12/15/2022   Kappa/Lambda Light Chains, Free, With Ratio, 24Hr. Urine    Standing Status:   Future    Standing Expiration Date:   12/21/2022   Multiple Myeloma Panel (SPEP&IFE w/QIG)    Standing Status:   Future    Standing Expiration Date:   12/21/2022   Comprehensive metabolic panel    Standing Status:  Future    Standing Expiration Date:   12/21/2022   CBC with Differential    Standing Status:   Future    Standing Expiration Date:   12/22/2022   Comprehensive metabolic panel    Standing Status:   Future    Standing Expiration Date:   12/28/2022   CBC with Differential    Standing Status:   Future    Standing Expiration Date:   12/29/2022   Comprehensive metabolic panel    Standing Status:   Future    Standing Expiration Date:   01/04/2023   CBC with Differential    Standing Status:   Future    Standing Expiration Date:   01/05/2023    Follow up  1 week lab dara-velcade 2 weeks lab  Dara-velcade 3 weeks flex lab MD Dara-Velcade   All questions were answered. The patient knows to call the clinic with any problems, questions or concerns.  Earlie Server, MD, PhD [facility] Hematology Oncology 11/09/2021    CHIEF COMPLAINTS/REASON FOR VISIT:  Follow-up for multiple myeloma  HISTORY OF PRESENTING ILLNESS:  Eric Cruz is a 71 y.o. male presents for follow up of myeloma .  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  27% plasma cell, cytogenetics - normal, and myeloma FISH panel is negative.  Standard risk.  [place]ongo red staining was added and was negative.  - 07/27/2021, bone marrow biopsy showed hypercellular marrow involved by plasma cell myeloma, CD138 immunohistochemistry plasma cells compromise approximately 70% of the cellular elements and are lambda restricted by light chain in situ hybridization.  Cytogenetics showed duplication of 1q.  Cytogenetics is normal.   04/22/2020 Cancer Staging   Staging form: Plasma Cell Myeloma and Plasma Cell Disorders, AJCC 8th Edition - Clinical stage from 04/22/2020: RISS Stage II (Beta-2-microglobulin (mg/L): 2.7, Albumin (g/dL): 3.1, ISS: Stage II, High-risk cytogenetics: Absent, LDH: Normal) - Signed by Earlie Server, MD on 08/10/2021 Stage prefix: Initial diagnosis Beta 2 microglobulin range (mg/L): Less than 3.5 Albumin range (g/dL): Less than 3.5 Cytogenetics: 1q addition Lactate dehydrogenase (LDH) (U/L): 136 Serum calcium level: Normal Serum creatinine level: Normal   08/12/2021 Imaging   PET scan showed 1. No evidence of active myeloma on skull base to thigh FDG PET scan. 2. No soft tissue plasmacytoma.3. No lytic or suspicious lesion on CT portion exam.     08/22/2021 -  Chemotherapy   Patient is on Treatment Plan : MYELOMA NEWLY  DIAGNOSED TRANSPLANT CANDIDATE DaraVRd (Daratumumab SQ) q21d x 6 Cycles (Induction/Consolidation)     08/24/2021 - 11/09/2021 Chemotherapy   Patient is on Treatment Plan : MYELOMA NEWLY DIAGNOSED TRANSPLANT CANDIDATE DaraVRd (Daratumumab SQ) q21d x 6 Cycles (Induction/Consolidation)     11/02/2021 Imaging   Bilateral lower extremity US negative for DVT    For rheumatoid arthritis, patient was seen by Dr. Estanislado Pandy recently and is currently off methotrexate and Humira.   INTERVAL HISTORY Eric Cruz is a 71 y.o. male who has above history reviewed by me today presents for follow up visit for management of multiple myeloma + Bilateral lower extremity swelling.[place] negative for DVT No shortness of breath, fever, nausea vomiting.  He has experienced intermittent diarrhea, per patient always happens on Saturdays.  Usually for loose bowel movements.  Symptoms spontaneously resolves.   Review of Systems  Constitutional:  Positive for  fatigue. Negative for appetite change, chills, fever and unexpected weight change.  HENT:   Negative for hearing loss and voice change.   Eyes:  Negative for eye problems and icterus.  Respiratory:  Negative for chest tightness, cough and shortness of breath.   Cardiovascular:  Negative for chest pain and leg swelling.  Gastrointestinal:  Negative for abdominal distention and abdominal pain.  Endocrine: Negative for hot flashes.  Genitourinary:  Negative for difficulty urinating, dysuria and frequency.   Musculoskeletal:  Positive for arthralgias and back pain.  Skin:  Negative for itching and rash.  Neurological:  Negative for light-headedness and numbness.  Hematological:  Negative for adenopathy. Does not bruise/bleed easily.  Psychiatric/Behavioral:  Negative for confusion.      MEDICAL HISTORY:  Past Medical History:  Diagnosis Date   Arthritis    BPH (benign prostatic hyperplasia)    Cancer (HCC)    Prostate: states he had a positive biopsy but had  another one a year later and it was gone.    Depression    Dizziness    GERD (gastroesophageal reflux disease)    Gout    High cholesterol    Hypertension    Sleep apnea    has cpap - not currently wearing   Varicose veins     SURGICAL HISTORY: Past Surgical History:  Procedure Laterality Date   ANTERIOR CERVICAL DECOMP/DISCECTOMY FUSION N/A 02/21/2018   Procedure: Cervical Five-Six Cervical Six-Seven Anterior cervical decompression/discectomy/fusion;  Surgeon: Ashok Pall, MD;  Location: [place];  Service: Neurosurgery;  Laterality: N/A;  Cervical Five-Six Cervical Six-Seven Anterior cervical decompression/discectomy/fusion   BIOPSY  10/09/2019   Procedure: BIOPSY;  Surgeon: Ronnette Juniper, MD;  Location: [facility];  Service: Gastroenterology;;   BLADDER SURGERY     CARPAL TUNNEL RELEASE Right 02/11/2019   Procedure: Right Carpal Tunnel Wound Irrigation;  Surgeon: Ashok Pall, MD;  Location: [place];  Service: Neurosurgery;  Laterality: Right;  Right Carpal tunnel wound exploration/wash out   CARPAL TUNNEL RELEASE Bilateral    CHOLECYSTECTOMY  11/22/2012   CHOLECYSTECTOMY  11/22/2012   Procedure: LAPAROSCOPIC CHOLECYSTECTOMY;  Surgeon: Gwenyth Ober, MD;  Location: [place];  Service: General;;   COLONOSCOPY     COLONOSCOPY WITH PROPOFOL N/A 10/09/2019   Procedure: COLONOSCOPY WITH PROPOFOL;  Surgeon: Ronnette Juniper, MD;  Location: [facility];  Service: Gastroenterology;  Laterality: N/A;   ESOPHAGOGASTRODUODENOSCOPY (EGD) WITH PROPOFOL N/A 10/09/2019   Procedure: ESOPHAGOGASTRODUODENOSCOPY (EGD) WITH PROPOFOL;  Surgeon: Ronnette Juniper, MD;  Location: [facility];  Service: Gastroenterology;  Laterality: N/A;   POLYPECTOMY  10/09/2019   Procedure: POLYPECTOMY;  Surgeon: Ronnette Juniper, MD;  Location: [facility];  Service: Gastroenterology;;    SOCIAL HISTORY: Social History   Socioeconomic History   Marital status: Married    Spouse name: Not on file   Number of children: Not on file    Years of education: Not on file   Highest education level: Not on file  Occupational History   Not on file  Tobacco Use   Smoking status: Former    Packs/day: 1.00    Years: 31.00    Total pack years: 31.00    Types: Cigarettes    Quit date: 04/12/1998    Years since quitting: 23.5    Passive exposure: Never   Smokeless tobacco: Never  Vaping Use   Vaping Use: Never used  Substance and Sexual Activity   Alcohol use: No   Drug use: No   Sexual activity: Not on file  Other Topics Concern   Not on file  Social History Narrative   Not on file   Social Determinants of Health   Financial Resource Strain: Not on file  Food Insecurity: Not on file  Transportation Needs: Not on file  Physical Activity: Not on file  Stress: Not on file  Social Connections: Not on file  Intimate Partner Violence: Not on file    FAMILY HISTORY: Family History  Problem Relation Age of Onset   Hypertension Mother    Diabetes Mother    Multiple myeloma Mother    Hypertension Father    Diabetes Father    Healthy Son    Healthy Daughter     ALLERGIES:  is allergic to atorvastatin, simvastatin, other, and statins.  MEDICATIONS:  Current Outpatient Medications  Medication Sig Dispense Refill   ferrous sulfate 325 (65 FE) MG EC tablet Take 325 mg by mouth daily with breakfast.     finasteride (PROSCAR) 5 MG tablet Take 5 mg by mouth daily.     folic acid (FOLVITE) 1 MG tablet Take 2 tablets (2 mg total) by mouth daily. 180 tablet 3   gabapentin (NEURONTIN) 100 MG capsule Take 1 capsule (100 mg total) by mouth at bedtime. 30 capsule 0   hydrocortisone cream 0.5 % Apply 1 Application topically 3 (three) times daily. Apply small amount to affected area 3 times daily until healed 56 g 1   methotrexate (RHEUMATREX) 2.5 MG tablet TAKE 8 TABLETS BY MOUTH ONCE  WEEKLY . CAUTION: CHEMOTHERAPY.  PROTECTION FROM LIGHT 96 tablet 0   montelukast (SINGULAIR) 10 MG tablet TAKE 1 TABLET BY MOUTH ON THE  DAY  PRIOR TO DARATUMUMAB  TREATMENT AND 1 TABLET BY MOUTH  DAILY FOR 2 DAYS AFTER TREATMENT 30 tablet 4   mupirocin cream (BACTROBAN) 2 % Apply 1 Application topically 2 (two) times daily. 30 g 0   tamsulosin (FLOMAX) 0.4 MG CAPS capsule Take 0.4 mg by mouth daily after supper.     Adalimumab (HUMIRA PEN) 40 MG/0.4ML PNKT Inject 40 mg into the skin every 14 (fourteen) days. (Patient not taking: Reported on 08/24/2021) 2 each 2   No current facility-administered medications for this visit.   Facility-Administered Medications Ordered in Other Visits  Medication Dose Route Frequency Provider Last Rate Last Admin   acetaminophen (TYLENOL) 325 MG tablet            dexamethasone (DECADRON) 4 MG tablet            diphenhydrAMINE (BENADRYL) 25 mg capsule              PHYSICAL EXAMINATION: ECOG PERFORMANCE STATUS: 1 - Symptomatic but completely ambulatory Vitals:   11/09/21 0841  BP: 131/68  Pulse: 63  Temp: 98.5 F (36.9 C)   Filed Weights   11/09/21 0841  Weight: (!) 326 lb (147.9 kg)    Physical Exam Constitutional:      General: He is not in acute distress.    Appearance: He is obese.     Comments: Patient walks with a cane.  HENT:     Head: Normocephalic and atraumatic.  Eyes:     General: No scleral icterus. Cardiovascular:     Rate and Rhythm: Normal rate and regular rhythm.     Heart sounds: Normal heart sounds.  Pulmonary:     Effort: Pulmonary effort is normal. No respiratory distress.     Breath sounds: No wheezing.  Abdominal:     General: Bowel sounds are normal.  There is no distension.     Palpations: Abdomen is soft.  Musculoskeletal:        General: No deformity. Normal range of motion.     Cervical back: Normal range of motion and neck supple.     Comments: Trace edema, bilateral low extremities.   Skin:    General: Skin is warm and dry.     Findings: No erythema or rash.  Neurological:     Mental Status: He is alert and oriented to person, place, and time.  Mental status is at baseline.     Cranial Nerves: No cranial nerve deficit.     Coordination: Coordination normal.  Psychiatric:        Mood and Affect: Mood normal.      LABORATORY DATA:  I have reviewed the data as listed Lab Results  Component Value Date   WBC 8.9 11/09/2021   HGB 12.7 (L) 11/09/2021   HCT 39.5 11/09/2021   MCV 94.5 11/09/2021   PLT 187 11/09/2021   Recent Labs    10/26/21 0824 11/02/21 0822 11/09/21 0817  NA 138 138 138  K 3.9 4.0 4.3  CL 109 106 108  CO2 '25 26 24  ' GLUCOSE 97 95 95  BUN '16 13 13  ' CREATININE 0.93 1.11 0.97  CALCIUM 8.8* 8.7* 9.0  GFRNONAA >60 >60 >60  PROT 7.1 6.7 6.7  ALBUMIN 3.8 3.7 3.7  AST 13* 16 13*  ALT '7 8 9  ' ALKPHOS 78 74 79  BILITOT 0.3 0.3 0.3    Iron/TIBC/Ferritin/ %Sat    Component Value Date/Time   IRON 78 04/05/2021 1008   TIBC 274 04/05/2021 1008   FERRITIN 425 (H) 04/05/2021 1008   IRONPCTSAT 28 04/05/2021 1008      RADIOGRAPHIC STUDIES: I have personally reviewed the radiological images as listed and agreed with the findings in the report.  US Venous Img Lower Bilateral  Result Date: 11/02/2021 CLINICAL DATA:  71 year old male with history of leg swelling. EXAM: BILATERAL LOWER EXTREMITY VENOUS DOPPLER ULTRASOUND TECHNIQUE: Gray-scale sonography with graded compression, as well as color Doppler and duplex ultrasound were performed to evaluate the lower extremity deep venous systems from the level of the common femoral vein and including the common femoral, femoral, profunda femoral, popliteal and calf veins including the posterior tibial, peroneal and gastrocnemius veins when visible. The superficial great saphenous vein was also interrogated. Spectral Doppler was utilized to evaluate flow at rest and with distal augmentation maneuvers in the common femoral, femoral and popliteal veins. COMPARISON:  None Available. FINDINGS: RIGHT LOWER EXTREMITY Common Femoral Vein: No evidence of thrombus. Normal  compressibility, respiratory phasicity and response to augmentation. Saphenofemoral Junction: No evidence of thrombus. Normal compressibility and flow on color Doppler imaging. Profunda Femoral Vein: No evidence of thrombus. Normal compressibility and flow on color Doppler imaging. Femoral Vein: No evidence of thrombus. Normal compressibility, respiratory phasicity and response to augmentation. Popliteal Vein: No evidence of thrombus. Normal compressibility, respiratory phasicity and response to augmentation. Calf Veins: No evidence of thrombus. Normal compressibility and flow on color Doppler imaging. Other Findings:  None. LEFT LOWER EXTREMITY Common Femoral Vein: No evidence of thrombus. Normal compressibility, respiratory phasicity and response to augmentation. Saphenofemoral Junction: No evidence of thrombus. Normal compressibility and flow on color Doppler imaging. Profunda Femoral Vein: No evidence of thrombus. Normal compressibility and flow on color Doppler imaging. Femoral Vein: No evidence of thrombus. Normal compressibility, respiratory phasicity and response to augmentation. Popliteal Vein: No evidence of thrombus. Normal  compressibility, respiratory phasicity and response to augmentation. Calf Veins: No evidence of thrombus. Normal compressibility and flow on color Doppler imaging. Other Findings:  None. IMPRESSION: No evidence of bilateral lower extremity deep venous thrombosis. Ruthann Cancer, MD [facility] Radiology Electronically Signed   By: Ruthann Cancer M.D.   On: 11/02/2021 14:23   NM PET Image Restage (PS) Whole Body  Result Date: 08/15/2021 CLINICAL DATA:  Subsequent treatment strategy for multiple myeloma. EXAM: NUCLEAR MEDICINE PET WHOLE BODY TECHNIQUE: 15.7 mCi F-18 FDG was injected intravenously. Full-ring PET imaging was performed from the head to foot after the radiotracer. CT data was obtained and used for attenuation correction and  anatomic localization. Fasting blood glucose: 89 mg/dl COMPARISON:  None Available. FINDINGS: Mediastinal blood pool activity: SUV max 2.8 HEAD/NECK: No hypermetabolic activity in the scalp. No hypermetabolic cervical lymph nodes. Incidental CT findings: none CHEST: No hypermetabolic mediastinal or hilar nodes. No suspicious pulmonary nodules on the CT scan. Incidental CT findings: none ABDOMEN/PELVIS: No abnormal hypermetabolic activity within the liver, pancreas, adrenal glands, or spleen. No hypermetabolic lymph nodes in the abdomen or pelvis. Incidental CT findings: none SKELETON: No focal hypermetabolic activity to suggest skeletal metastasis. Incidental CT findings: No lytic lesions on CT portion exam. EXTREMITIES: No abnormal hypermetabolic activity in the lower extremities. Incidental CT findings: No lytic lesion on CT portion exam. IMPRESSION: 1. No evidence of active myeloma on skull base to thigh FDG PET scan. 2. No soft tissue plasmacytoma. 3. No lytic or suspicious lesion on CT portion exam. Electronically Signed   By: Suzy Bouchard M.D.   On: 08/15/2021 16:13

## 2021-11-09 NOTE — Assessment & Plan Note (Signed)
secondary to chemotherapy, and chronic disease.  Stable.

## 2021-11-09 NOTE — Progress Notes (Signed)
ON PATHWAY REGIMEN - Multiple Myeloma and Other Plasma Cell Dyscrasias  No Change  Continue With Treatment as Ordered.  Original Decision Date/Time: 08/10/2021 22:51   DaraVRd (Daratumumab SUBQ + Bortezomib SUBQ + Lenalidomide PO + Dexamethasone IV/PO) q21 Days (Induction Schema):   A cycle is every 21 days:     Lenalidomide      Dexamethasone      Bortezomib      Daratumumab and hyaluronidase-fihj    DaraVRd (Daratumumab SUBQ + Bortezomib SUBQ + Lenalidomide PO + Dexamethasone IV/PO) q21 Days (Consolidation Schema):   A cycle is every 21 days:     Lenalidomide      Dexamethasone      Bortezomib      Daratumumab and hyaluronidase-fihj   **Always confirm dose/schedule in your pharmacy ordering system**  Patient Characteristics: Multiple Myeloma, Newly Diagnosed, Transplant Eligible, High Risk Disease Classification: Multiple Myeloma R-ISS Staging: II Therapeutic Status: Newly Diagnosed Is Patient Eligible for Transplant<= Transplant Eligible Risk Status: High Risk Intent of Therapy: Non-Curative / Palliative Intent, Discussed with Patient

## 2021-11-09 NOTE — Assessment & Plan Note (Signed)
Chemotherapy plan as listed above 

## 2021-11-09 NOTE — Assessment & Plan Note (Signed)
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 Proceed with cycle 4 day 1 Velcade Dara. Take Revlimid D1-14 Singualair 55m 1 day prior to daratumumab treatments, 2 days after treatment.   Discussed about patient about rationale of bone strengthening agents and potential side effects Recommend patient to obtain dental clearance.  [facility] for evaluation.

## 2021-11-09 NOTE — Patient Instructions (Signed)
[facility]CER CTR AT [place]  Discharge Instructions: Thank you for choosing [place] to provide your oncology and hematology care.  If you have a lab appointment with the [place], please go directly to the [place] and check in at the registration area.  Wear comfortable clothing and clothing appropriate for easy access to any Portacath or PICC line.   We strive to give you quality time with your provider. You may need to reschedule your appointment if you arrive late (15 or more minutes).  Arriving late affects you and other patients whose appointments are after yours.  Also, if you miss three or more appointments without notifying the office, you may be dismissed from the clinic at the provider's discretion.      For prescription refill requests, have your pharmacy contact our office and allow 72 hours for refills to be completed.    Today you received the following chemotherapy and/or immunotherapy agents Darzalex and Velcade.      To help prevent nausea and vomiting after your treatment, we encourage you to take your nausea medication as directed.  BELOW ARE SYMPTOMS THAT SHOULD BE REPORTED IMMEDIATELY: *FEVER GREATER THAN 100.4 F (38 C) OR HIGHER *CHILLS OR SWEATING *NAUSEA AND VOMITING THAT IS NOT CONTROLLED WITH YOUR NAUSEA MEDICATION *UNUSUAL SHORTNESS OF BREATH *UNUSUAL BRUISING OR BLEEDING *URINARY PROBLEMS (pain or burning when urinating, or frequent urination) *BOWEL PROBLEMS (unusual diarrhea, constipation, pain near the anus) TENDERNESS IN MOUTH AND THROAT WITH OR WITHOUT PRESENCE OF ULCERS (sore throat, sores in mouth, or a toothache) UNUSUAL RASH, SWELLING OR PAIN  UNUSUAL VAGINAL DISCHARGE OR ITCHING   Items with * indicate a potential emergency and should be followed up as soon as possible or go to the Emergency Department if any problems should occur.  Please show the CHEMOTHERAPY ALERT CARD or IMMUNOTHERAPY ALERT CARD at  check-in to the Emergency Department and triage nurse.  Should you have questions after your visit or need to cancel or reschedule your appointment, please contact [facility] CANCER [place] AT [place]  [phone] and follow the prompts.  Office hours are 8:00 a.m. to 4:30 p.m. Monday - Friday. Please note that voicemails left after 4:00 p.m. may not be returned until the following business day.  We are closed weekends and major holidays. You have access to a nurse at all times for urgent questions. Please call the main number to the clinic [phone] and follow the prompts.  For any non-urgent questions, you may also contact your provider using MyChart. We now offer e-Visits for anyone 49 and older to request care online for non-urgent symptoms. For details visit [email or web address].   Also download the MyChart app! Go to the app store, search "MyChart", open the app, select [place], and log in with your MyChart username and password.  Masks are optional in the cancer centers. If you would like for your care team to wear a mask while they are taking care of you, please let them know. For doctor visits, patients may have with them one support person who is at least 69 years old. At this time, visitors are not allowed in the infusion area.

## 2021-11-10 LAB — KAPPA/LAMBDA LIGHT CHAINS
Kappa free light chain: 14.3 mg/L (ref 3.3–19.4)
Kappa, lambda light chain ratio: 1.59 (ref 0.26–1.65)
Lambda free light chains: 9 mg/L (ref 5.7–26.3)

## 2021-11-14 ENCOUNTER — Ambulatory Visit: Payer: Medicare Other

## 2021-11-14 LAB — MULTIPLE MYELOMA PANEL, SERUM
Albumin SerPl Elph-Mcnc: 3.5 g/dL (ref 2.9–4.4)
Albumin/Glob SerPl: 1.4 (ref 0.7–1.7)
Alpha 1: 0.2 g/dL (ref 0.0–0.4)
Alpha2 Glob SerPl Elph-Mcnc: 0.7 g/dL (ref 0.4–1.0)
B-Globulin SerPl Elph-Mcnc: 1 g/dL (ref 0.7–1.3)
Gamma Glob SerPl Elph-Mcnc: 0.7 g/dL (ref 0.4–1.8)
Globulin, Total: 2.6 g/dL (ref 2.2–3.9)
IgA: 128 mg/dL (ref 61–437)
IgG (Immunoglobin G), Serum: 730 mg/dL (ref 603–1613)
IgM (Immunoglobulin M), Srm: 68 mg/dL (ref 20–172)
M Protein SerPl Elph-Mcnc: 0.2 g/dL — ABNORMAL HIGH
Total Protein ELP: 6.1 g/dL (ref 6.0–8.5)

## 2021-11-15 ENCOUNTER — Ambulatory Visit
Admission: RE | Admit: 2021-11-15 | Discharge: 2021-11-15 | Disposition: A | Discharge status: Home / Self Care | Payer: Medicare Other | Source: Ambulatory Visit | Attending: Oncology | Admitting: Oncology

## 2021-11-15 DIAGNOSIS — I10 Essential (primary) hypertension: Secondary | ICD-10-CM | POA: Insufficient documentation

## 2021-11-15 DIAGNOSIS — C9000 Multiple myeloma not having achieved remission: Secondary | ICD-10-CM | POA: Insufficient documentation

## 2021-11-15 DIAGNOSIS — M7989 Other specified soft tissue disorders: Secondary | ICD-10-CM | POA: Insufficient documentation

## 2021-11-15 DIAGNOSIS — G4730 Sleep apnea, unspecified: Secondary | ICD-10-CM | POA: Insufficient documentation

## 2021-11-15 DIAGNOSIS — Z0189 Encounter for other specified special examinations: Secondary | ICD-10-CM

## 2021-11-15 DIAGNOSIS — E785 Hyperlipidemia, unspecified: Secondary | ICD-10-CM | POA: Diagnosis not present

## 2021-11-15 DIAGNOSIS — C61 Malignant neoplasm of prostate: Secondary | ICD-10-CM | POA: Insufficient documentation

## 2021-11-15 DIAGNOSIS — Z87891 Personal history of nicotine dependence: Secondary | ICD-10-CM | POA: Diagnosis not present

## 2021-11-15 LAB — ECHOCARDIOGRAM COMPLETE
AR max vel: 2.07 cm2
AV Area VTI: 2.11 cm2
AV Area mean vel: 1.89 cm2
AV Mean grad: 4 mmHg
AV Peak grad: 7.3 mmHg
Ao pk vel: 1.35 m/s
Area-P 1/2: 3.13 cm2
S' Lateral: 3.15 cm

## 2021-11-16 ENCOUNTER — Inpatient Hospital Stay: Payer: Medicare Other

## 2021-11-16 VITALS — BP 121/61 | HR 60 | Temp 97.9°F | Resp 18 | Wt 323.6 lb

## 2021-11-16 DIAGNOSIS — M069 Rheumatoid arthritis, unspecified: Secondary | ICD-10-CM | POA: Diagnosis not present

## 2021-11-16 DIAGNOSIS — K521 Toxic gastroenteritis and colitis: Secondary | ICD-10-CM | POA: Diagnosis not present

## 2021-11-16 DIAGNOSIS — Z7961 Long term (current) use of immunomodulator: Secondary | ICD-10-CM | POA: Diagnosis not present

## 2021-11-16 DIAGNOSIS — Z5112 Encounter for antineoplastic immunotherapy: Secondary | ICD-10-CM | POA: Diagnosis not present

## 2021-11-16 DIAGNOSIS — Z79899 Other long term (current) drug therapy: Secondary | ICD-10-CM | POA: Diagnosis not present

## 2021-11-16 DIAGNOSIS — D6481 Anemia due to antineoplastic chemotherapy: Secondary | ICD-10-CM | POA: Diagnosis not present

## 2021-11-16 DIAGNOSIS — Z79624 Long term (current) use of inhibitors of nucleotide synthesis: Secondary | ICD-10-CM | POA: Diagnosis not present

## 2021-11-16 DIAGNOSIS — Z87891 Personal history of nicotine dependence: Secondary | ICD-10-CM | POA: Diagnosis not present

## 2021-11-16 DIAGNOSIS — C9000 Multiple myeloma not having achieved remission: Secondary | ICD-10-CM | POA: Diagnosis not present

## 2021-11-16 DIAGNOSIS — M7989 Other specified soft tissue disorders: Secondary | ICD-10-CM | POA: Diagnosis not present

## 2021-11-16 DIAGNOSIS — Z7982 Long term (current) use of aspirin: Secondary | ICD-10-CM | POA: Diagnosis not present

## 2021-11-16 DIAGNOSIS — R21 Rash and other nonspecific skin eruption: Secondary | ICD-10-CM | POA: Diagnosis not present

## 2021-11-16 DIAGNOSIS — T451X5A Adverse effect of antineoplastic and immunosuppressive drugs, initial encounter: Secondary | ICD-10-CM | POA: Diagnosis not present

## 2021-11-16 LAB — COMPREHENSIVE METABOLIC PANEL
ALT: 8 U/L (ref 0–44)
AST: 16 U/L (ref 15–41)
Albumin: 3.6 g/dL (ref 3.5–5.0)
Alkaline Phosphatase: 68 U/L (ref 38–126)
Anion gap: 5 (ref 5–15)
BUN: 9 mg/dL (ref 8–23)
CO2: 25 mmol/L (ref 22–32)
Calcium: 8.4 mg/dL — ABNORMAL LOW (ref 8.9–10.3)
Chloride: 106 mmol/L (ref 98–111)
Creatinine, Ser: 0.9 mg/dL (ref 0.61–1.24)
GFR, Estimated: >60 mL/min (ref >60)
Glucose, Bld: 90 mg/dL (ref 70–99)
Potassium: 3.9 mmol/L (ref 3.5–5.1)
Sodium: 136 mmol/L (ref 135–145)
Total Bilirubin: 0.5 mg/dL (ref 0.3–1.2)
Total Protein: 6.5 g/dL (ref 6.5–8.1)

## 2021-11-16 LAB — CBC WITH DIFFERENTIAL/PLATELET
Abs Immature Granulocytes: 0.04 10*3/uL (ref 0.00–0.07)
Basophils Absolute: 0 10*3/uL (ref 0.0–0.1)
Basophils Relative: 1 %
Eosinophils Absolute: 1.3 10*3/uL — ABNORMAL HIGH (ref 0.0–0.5)
Eosinophils Relative: 16 %
HCT: 34.5 % — ABNORMAL LOW (ref 39.0–52.0)
Hemoglobin: 11.2 g/dL — ABNORMAL LOW (ref 13.0–17.0)
Immature Granulocytes: 1 %
Lymphocytes Relative: 12 %
Lymphs Abs: 1 10*3/uL (ref 0.7–4.0)
MCH: 30.9 pg (ref 26.0–34.0)
MCHC: 32.5 g/dL (ref 30.0–36.0)
MCV: 95 fL (ref 80.0–100.0)
Monocytes Absolute: 0.4 10*3/uL (ref 0.1–1.0)
Monocytes Relative: 5 %
Neutro Abs: 5.2 10*3/uL (ref 1.7–7.7)
Neutrophils Relative %: 65 %
Platelets: 173 10*3/uL (ref 150–400)
RBC: 3.63 MIL/uL — ABNORMAL LOW (ref 4.22–5.81)
RDW: 15.8 % — ABNORMAL HIGH (ref 11.5–15.5)
WBC: 7.9 10*3/uL (ref 4.0–10.5)
nRBC: 0 % (ref 0.0–0.2)

## 2021-11-16 MED ORDER — DEXAMETHASONE 4 MG PO TABS
20.0000 mg | ORAL_TABLET | Freq: Once | ORAL | Status: AC
  Administered 2021-11-16: 20 mg via ORAL

## 2021-11-16 MED ORDER — ACETAMINOPHEN 325 MG PO TABS
650.0000 mg | ORAL_TABLET | Freq: Once | ORAL | Status: AC
  Administered 2021-11-16: 650 mg via ORAL

## 2021-11-16 MED ORDER — DARATUMUMAB-HYALURONIDASE-FIHJ 1800-30000 MG-UT/15ML ~~LOC~~ SOLN
1800.0000 mg | Freq: Once | SUBCUTANEOUS | Status: AC
  Administered 2021-11-16: 1800 mg via SUBCUTANEOUS
  Filled 2021-11-16: qty 15

## 2021-11-16 MED ORDER — BORTEZOMIB CHEMO SQ INJECTION 3.5 MG (2.5MG/ML)
1.3000 mg/m2 | Freq: Once | INTRAMUSCULAR | Status: AC
  Administered 2021-11-16: 3.5 mg via SUBCUTANEOUS
  Filled 2021-11-16: qty 1.4

## 2021-11-16 MED ORDER — DIPHENHYDRAMINE HCL 25 MG PO CAPS
50.0000 mg | ORAL_CAPSULE | Freq: Once | ORAL | Status: AC
  Administered 2021-11-16: 50 mg via ORAL

## 2021-11-16 NOTE — Patient Instructions (Signed)
[facility] CANCER CTR AT [place]  Discharge Instructions: Thank you for choosing [facility] to provide your oncology and hematology care.  If you have a lab appointment with the [facility], please go directly to the [facility] and check in at the registration area.  Wear comfortable clothing and clothing appropriate for easy access to any Portacath or PICC line.   We strive to give you quality time with your provider. You may need to reschedule your appointment if you arrive late (15 or more minutes).  Arriving late affects you and other patients whose appointments are after yours.  Also, if you miss three or more appointments without notifying the office, you may be dismissed from the clinic at the provider's discretion.      For prescription refill requests, have your pharmacy contact our office and allow 72 hours for refills to be completed.    Today you received the following chemotherapy and/or immunotherapy agents Darzalex, Velcade      To help prevent nausea and vomiting after your treatment, we encourage you to take your nausea medication as directed.  BELOW ARE SYMPTOMS THAT SHOULD BE REPORTED IMMEDIATELY: *FEVER GREATER THAN 100.4 F (38 C) OR HIGHER *CHILLS OR SWEATING *NAUSEA AND VOMITING THAT IS NOT CONTROLLED WITH YOUR NAUSEA MEDICATION *UNUSUAL SHORTNESS OF BREATH *UNUSUAL BRUISING OR BLEEDING *URINARY PROBLEMS (pain or burning when urinating, or frequent urination) *BOWEL PROBLEMS (unusual diarrhea, constipation, pain near the anus) TENDERNESS IN MOUTH AND THROAT WITH OR WITHOUT PRESENCE OF ULCERS (sore throat, sores in mouth, or a toothache) UNUSUAL RASH, SWELLING OR PAIN  UNUSUAL VAGINAL DISCHARGE OR ITCHING   Items with * indicate a potential emergency and should be followed up as soon as possible or go to the Emergency Department if any problems should occur.  Please show the CHEMOTHERAPY ALERT CARD or IMMUNOTHERAPY ALERT CARD at  check-in to the Emergency Department and triage nurse.  Should you have questions after your visit or need to cancel or reschedule your appointment, please contact [facility] CANCER [place] AT [place]  [phone] and follow the prompts.  Office hours are 8:00 a.m. to 4:30 p.m. Monday - Friday. Please note that voicemails left after 4:00 p.m. may not be returned until the following business day.  We are closed weekends and major holidays. You have access to a nurse at all times for urgent questions. Please call the main number to the clinic [phone] and follow the prompts.  For any non-urgent questions, you may also contact your provider using MyChart. We now offer e-Visits for anyone 61 and older to request care online for non-urgent symptoms. For details visit [email or web address].   Also download the MyChart app! Go to the app store, search "MyChart", open the app, select [facility], and log in with your MyChart username and password.  Masks are optional in the cancer centers. If you would like for your care team to wear a mask while they are taking care of you, please let them know. For doctor visits, patients may have with them one support person who is at least 69 years old. At this time, visitors are not allowed in the infusion area.

## 2021-11-23 ENCOUNTER — Inpatient Hospital Stay (HOSPITAL_BASED_OUTPATIENT_CLINIC_OR_DEPARTMENT_OTHER): Payer: Medicare Other | Admitting: Medical Oncology

## 2021-11-23 ENCOUNTER — Inpatient Hospital Stay: Payer: Medicare Other

## 2021-11-23 ENCOUNTER — Other Ambulatory Visit: Payer: Self-pay

## 2021-11-23 ENCOUNTER — Other Ambulatory Visit: Payer: Self-pay | Admitting: *Deleted

## 2021-11-23 ENCOUNTER — Other Ambulatory Visit: Payer: Self-pay | Admitting: Medical Oncology

## 2021-11-23 VITALS — BP 127/63 | HR 60 | Temp 97.0°F | Resp 18 | Wt 321.4 lb

## 2021-11-23 DIAGNOSIS — C9000 Multiple myeloma not having achieved remission: Secondary | ICD-10-CM

## 2021-11-23 DIAGNOSIS — Z7961 Long term (current) use of immunomodulator: Secondary | ICD-10-CM | POA: Diagnosis not present

## 2021-11-23 DIAGNOSIS — T451X5A Adverse effect of antineoplastic and immunosuppressive drugs, initial encounter: Secondary | ICD-10-CM | POA: Diagnosis not present

## 2021-11-23 DIAGNOSIS — K521 Toxic gastroenteritis and colitis: Secondary | ICD-10-CM | POA: Diagnosis not present

## 2021-11-23 DIAGNOSIS — D6481 Anemia due to antineoplastic chemotherapy: Secondary | ICD-10-CM | POA: Diagnosis not present

## 2021-11-23 DIAGNOSIS — Z87891 Personal history of nicotine dependence: Secondary | ICD-10-CM | POA: Diagnosis not present

## 2021-11-23 DIAGNOSIS — L989 Disorder of the skin and subcutaneous tissue, unspecified: Secondary | ICD-10-CM | POA: Diagnosis not present

## 2021-11-23 DIAGNOSIS — Z5112 Encounter for antineoplastic immunotherapy: Secondary | ICD-10-CM | POA: Diagnosis not present

## 2021-11-23 DIAGNOSIS — Z5111 Encounter for antineoplastic chemotherapy: Secondary | ICD-10-CM

## 2021-11-23 DIAGNOSIS — R21 Rash and other nonspecific skin eruption: Secondary | ICD-10-CM | POA: Diagnosis not present

## 2021-11-23 DIAGNOSIS — M7989 Other specified soft tissue disorders: Secondary | ICD-10-CM | POA: Diagnosis not present

## 2021-11-23 DIAGNOSIS — M069 Rheumatoid arthritis, unspecified: Secondary | ICD-10-CM | POA: Diagnosis not present

## 2021-11-23 DIAGNOSIS — D849 Immunodeficiency, unspecified: Secondary | ICD-10-CM

## 2021-11-23 DIAGNOSIS — L282 Other prurigo: Secondary | ICD-10-CM

## 2021-11-23 DIAGNOSIS — Z79899 Other long term (current) drug therapy: Secondary | ICD-10-CM | POA: Diagnosis not present

## 2021-11-23 DIAGNOSIS — Z7982 Long term (current) use of aspirin: Secondary | ICD-10-CM | POA: Diagnosis not present

## 2021-11-23 DIAGNOSIS — Z79624 Long term (current) use of inhibitors of nucleotide synthesis: Secondary | ICD-10-CM | POA: Diagnosis not present

## 2021-11-23 LAB — CBC WITH DIFFERENTIAL/PLATELET
Abs Immature Granulocytes: 0.04 10*3/uL (ref 0.00–0.07)
Basophils Absolute: 0.1 10*3/uL (ref 0.0–0.1)
Basophils Relative: 1 %
Eosinophils Absolute: 0.8 10*3/uL — ABNORMAL HIGH (ref 0.0–0.5)
Eosinophils Relative: 10 %
HCT: 36.5 % — ABNORMAL LOW (ref 39.0–52.0)
Hemoglobin: 11.9 g/dL — ABNORMAL LOW (ref 13.0–17.0)
Immature Granulocytes: 1 %
Lymphocytes Relative: 14 %
Lymphs Abs: 1.1 10*3/uL (ref 0.7–4.0)
MCH: 30.6 pg (ref 26.0–34.0)
MCHC: 32.6 g/dL (ref 30.0–36.0)
MCV: 93.8 fL (ref 80.0–100.0)
Monocytes Absolute: 1.2 10*3/uL — ABNORMAL HIGH (ref 0.1–1.0)
Monocytes Relative: 15 %
Neutro Abs: 4.8 10*3/uL (ref 1.7–7.7)
Neutrophils Relative %: 59 %
Platelets: 226 10*3/uL (ref 150–400)
RBC: 3.89 MIL/uL — ABNORMAL LOW (ref 4.22–5.81)
RDW: 15.8 % — ABNORMAL HIGH (ref 11.5–15.5)
WBC: 8 10*3/uL (ref 4.0–10.5)
nRBC: 0 % (ref 0.0–0.2)

## 2021-11-23 LAB — COMPREHENSIVE METABOLIC PANEL
ALT: 8 U/L (ref 0–44)
AST: 15 U/L (ref 15–41)
Albumin: 3.6 g/dL (ref 3.5–5.0)
Alkaline Phosphatase: 68 U/L (ref 38–126)
Anion gap: 8 (ref 5–15)
BUN: 15 mg/dL (ref 8–23)
CO2: 24 mmol/L (ref 22–32)
Calcium: 8.6 mg/dL — ABNORMAL LOW (ref 8.9–10.3)
Chloride: 106 mmol/L (ref 98–111)
Creatinine, Ser: 1 mg/dL (ref 0.61–1.24)
GFR, Estimated: >60 mL/min (ref >60)
Glucose, Bld: 108 mg/dL — ABNORMAL HIGH (ref 70–99)
Potassium: 4 mmol/L (ref 3.5–5.1)
Sodium: 138 mmol/L (ref 135–145)
Total Bilirubin: 0.5 mg/dL (ref 0.3–1.2)
Total Protein: 6.6 g/dL (ref 6.5–8.1)

## 2021-11-23 MED ORDER — DEXAMETHASONE 4 MG PO TABS
20.0000 mg | ORAL_TABLET | Freq: Once | ORAL | Status: AC
  Administered 2021-11-23: 20 mg via ORAL
  Filled 2021-11-23: qty 5

## 2021-11-23 MED ORDER — DARATUMUMAB-HYALURONIDASE-FIHJ 1800-30000 MG-UT/15ML ~~LOC~~ SOLN
1800.0000 mg | Freq: Once | SUBCUTANEOUS | Status: AC
  Administered 2021-11-23: 1800 mg via SUBCUTANEOUS
  Filled 2021-11-23: qty 15

## 2021-11-23 MED ORDER — DOXYCYCLINE HYCLATE 100 MG PO TABS: 100.0000 mg | ORAL_TABLET | Freq: Two times a day (BID) | ORAL | 0 refills | Status: AC

## 2021-11-23 MED ORDER — DIPHENHYDRAMINE HCL 25 MG PO CAPS
50.0000 mg | ORAL_CAPSULE | Freq: Once | ORAL | Status: AC
  Administered 2021-11-23: 50 mg via ORAL
  Filled 2021-11-23: qty 2

## 2021-11-23 MED ORDER — ACETAMINOPHEN 325 MG PO TABS
650.0000 mg | ORAL_TABLET | Freq: Once | ORAL | Status: AC
  Administered 2021-11-23: 650 mg via ORAL
  Filled 2021-11-23: qty 2

## 2021-11-23 MED ORDER — BORTEZOMIB CHEMO SQ INJECTION 3.5 MG (2.5MG/ML)
1.3000 mg/m2 | Freq: Once | INTRAMUSCULAR | Status: AC
  Administered 2021-11-23: 3.5 mg via SUBCUTANEOUS
  Filled 2021-11-23: qty 1.4

## 2021-11-23 NOTE — Progress Notes (Signed)
Open sore noted on lower abdomen, pt reports he started feeling pain in the area on Saturday and then it "busted" on Monday and has been draining. Pt also reports rash to upper chest and back with taking Revlimid. Dr. Tasia Catchings made aware. Per Dr. Glean Salvo PA to assess pt.  Nelwyn Salisbury PA at chairside to assess. Per Judson Roch PA medication will be called into patient's pharmacy and will discuss treatment plan with Dr. Tasia Catchings.   1020: Per Janeann Merl RN per Dr. Tasia Catchings proceed with Vecade/Darzalex treatment as scheduled.   1230: Pt tolerated treatment well and stable at this time.   1317: pt stable at discharge.

## 2021-11-23 NOTE — Patient Instructions (Signed)
[facility] CANCER CTR AT [place]  Discharge Instructions: Thank you for choosing [facility] to provide your oncology and hematology care.  If you have a lab appointment with the [facility], please go directly to the [facility] and check in at the registration area.  Wear comfortable clothing and clothing appropriate for easy access to any Portacath or PICC line.   We strive to give you quality time with your provider. You may need to reschedule your appointment if you arrive late (15 or more minutes).  Arriving late affects you and other patients whose appointments are after yours.  Also, if you miss three or more appointments without notifying the office, you may be dismissed from the clinic at the provider's discretion.      For prescription refill requests, have your pharmacy contact our office and allow 72 hours for refills to be completed.    Today you received the following chemotherapy and/or immunotherapy agents Darzalex and Velcade      To help prevent nausea and vomiting after your treatment, we encourage you to take your nausea medication as directed.  BELOW ARE SYMPTOMS THAT SHOULD BE REPORTED IMMEDIATELY: *FEVER GREATER THAN 100.4 F (38 C) OR HIGHER *CHILLS OR SWEATING *NAUSEA AND VOMITING THAT IS NOT CONTROLLED WITH YOUR NAUSEA MEDICATION *UNUSUAL SHORTNESS OF BREATH *UNUSUAL BRUISING OR BLEEDING *URINARY PROBLEMS (pain or burning when urinating, or frequent urination) *BOWEL PROBLEMS (unusual diarrhea, constipation, pain near the anus) TENDERNESS IN MOUTH AND THROAT WITH OR WITHOUT PRESENCE OF ULCERS (sore throat, sores in mouth, or a toothache) UNUSUAL RASH, SWELLING OR PAIN  UNUSUAL VAGINAL DISCHARGE OR ITCHING   Items with * indicate a potential emergency and should be followed up as soon as possible or go to the Emergency Department if any problems should occur.  Please show the CHEMOTHERAPY ALERT CARD or IMMUNOTHERAPY ALERT CARD at  check-in to the Emergency Department and triage nurse.  Should you have questions after your visit or need to cancel or reschedule your appointment, please contact [facility] CANCER [place] AT [place]  [phone] and follow the prompts.  Office hours are 8:00 a.m. to 4:30 p.m. Monday - Friday. Please note that voicemails left after 4:00 p.m. may not be returned until the following business day.  We are closed weekends and major holidays. You have access to a nurse at all times for urgent questions. Please call the main number to the clinic [phone] and follow the prompts.  For any non-urgent questions, you may also contact your provider using MyChart. We now offer e-Visits for anyone 60 and older to request care online for non-urgent symptoms. For details visit [email or web address].   Also download the MyChart app! Go to the app store, search "MyChart", open the app, select [facility], and log in with your MyChart username and password.  Masks are optional in the cancer centers. If you would like for your care team to wear a mask while they are taking care of you, please let them know. For doctor visits, patients may have with them one support person who is at least 69 years old. At this time, visitors are not allowed in the infusion area.

## 2021-11-23 NOTE — Progress Notes (Signed)
Symptom Management [facility] at [facility] [phone] [phone]) [phone]  Patient Care Team: Seward Carol, MD as PCP - General (Internal Medicine)   Name of the patient: Eric Cruz  [id number]  Dec 13, 1950   Date of visit: 11/23/21  Reason for Consult: Eric Cruz is a 71 y.o. male who presents today for:  Skin Lesion: Patient is here today for Cycle 4 velcade/DARA. He is also on Revlimid. He reports that a few days ago he got a pimple like lesion of his right lower abdomen. This "burst" and now appears to be healing. Mildly uncomfortable if he touches it. No other lesions. No fevers, vomiting. Also notes a chronic slightly itchy rash with the Revlimid which is unchanged.   Denies any neurologic complaints. Denies recent fevers or illnesses. Denies any easy bleeding or bruising. Reports good appetite and denies weight loss. Denies chest pain. Denies any nausea, vomiting, constipation, or diarrhea. Denies urinary complaints. Patient offers no further specific complaints today.    PAST MEDICAL HISTORY: Past Medical History:  Diagnosis Date   Arthritis    BPH (benign prostatic hyperplasia)    Cancer (HCC)    Prostate: states he had a positive biopsy but had another one a year later and it was gone.    Depression    Dizziness    GERD (gastroesophageal reflux disease)    Gout    High cholesterol    Hypertension    Sleep apnea    has cpap - not currently wearing   Varicose veins     PAST SURGICAL HISTORY:  Past Surgical History:  Procedure Laterality Date   ANTERIOR CERVICAL DECOMP/DISCECTOMY FUSION N/A 02/21/2018   Procedure: Cervical Five-Six Cervical Six-Seven Anterior cervical decompression/discectomy/fusion;  Surgeon: Ashok Pall, MD;  Location: [place];  Service: Neurosurgery;  Laterality: N/A;  Cervical Five-Six Cervical Six-Seven Anterior cervical decompression/discectomy/fusion   BIOPSY  10/09/2019   Procedure:  BIOPSY;  Surgeon: Ronnette Juniper, MD;  Location: [facility];  Service: Gastroenterology;;   BLADDER SURGERY     CARPAL TUNNEL RELEASE Right 02/11/2019   Procedure: Right Carpal Tunnel Wound Irrigation;  Surgeon: Ashok Pall, MD;  Location: [place];  Service: Neurosurgery;  Laterality: Right;  Right Carpal tunnel wound exploration/wash out   CARPAL TUNNEL RELEASE Bilateral    CHOLECYSTECTOMY  11/22/2012   CHOLECYSTECTOMY  11/22/2012   Procedure: LAPAROSCOPIC CHOLECYSTECTOMY;  Surgeon: Gwenyth Ober, MD;  Location: [place];  Service: General;;   COLONOSCOPY     COLONOSCOPY WITH PROPOFOL N/A 10/09/2019   Procedure: COLONOSCOPY WITH PROPOFOL;  Surgeon: Ronnette Juniper, MD;  Location: [facility];  Service: Gastroenterology;  Laterality: N/A;   ESOPHAGOGASTRODUODENOSCOPY (EGD) WITH PROPOFOL N/A 10/09/2019   Procedure: ESOPHAGOGASTRODUODENOSCOPY (EGD) WITH PROPOFOL;  Surgeon: Ronnette Juniper, MD;  Location: [facility];  Service: Gastroenterology;  Laterality: N/A;   POLYPECTOMY  10/09/2019   Procedure: POLYPECTOMY;  Surgeon: Ronnette Juniper, MD;  Location: [facility];  Service: Gastroenterology;;    HEMATOLOGY/ONCOLOGY HISTORY:  Oncology History  Multiple myeloma not having achieved remission ([place])  04/22/2020 Initial Diagnosis   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  27% plasma cell, cytogenetics - normal, and myeloma FISH panel is negative.  Standard risk.  [place]ongo red staining was added and was negative.  - 07/27/2021, bone marrow biopsy showed hypercellular marrow involved by plasma cell myeloma, CD138 immunohistochemistry plasma cells compromise approximately 70% of the cellular elements and are  lambda restricted by light chain in situ hybridization.  Cytogenetics showed duplication of 1q.  Cytogenetics is normal.   04/22/2020 Cancer Staging   Staging form: Plasma Cell Myeloma and Plasma Cell Disorders, AJCC 8th  Edition - Clinical stage from 04/22/2020: RISS Stage II (Beta-2-microglobulin (mg/L): 2.7, Albumin (g/dL): 3.1, ISS: Stage II, High-risk cytogenetics: Absent, LDH: Normal) - Signed by Earlie Server, MD on 08/10/2021 Stage prefix: Initial diagnosis Beta 2 microglobulin range (mg/L): Less than 3.5 Albumin range (g/dL): Less than 3.5 Cytogenetics: 1q addition Lactate dehydrogenase (LDH) (U/L): 136 Serum calcium level: Normal Serum creatinine level: Normal   08/12/2021 Imaging   PET scan showed 1. No evidence of active myeloma on skull base to thigh FDG PET scan. 2. No soft tissue plasmacytoma.3. No lytic or suspicious lesion on CT portion exam.     08/22/2021 -  Chemotherapy   Patient is on Treatment Plan : MYELOMA NEWLY DIAGNOSED TRANSPLANT CANDIDATE DaraVRd (Daratumumab SQ) q21d x 6 Cycles (Induction/Consolidation)     08/24/2021 - 11/09/2021 Chemotherapy   Patient is on Treatment Plan : MYELOMA NEWLY DIAGNOSED TRANSPLANT CANDIDATE DaraVRd (Daratumumab SQ) q21d x 6 Cycles (Induction/Consolidation)     11/02/2021 Imaging   Bilateral lower extremity US negative for DVT     ALLERGIES:  is allergic to atorvastatin, simvastatin, other, and statins.  MEDICATIONS:  Current Outpatient Medications  Medication Sig Dispense Refill   Adalimumab (HUMIRA PEN) 40 MG/0.4ML PNKT Inject 40 mg into the skin every 14 (fourteen) days. (Patient not taking: Reported on 08/24/2021) 2 each 2   doxycycline (VIBRA-TABS) 100 MG tablet Take 1 tablet (100 mg total) by mouth 2 (two) times daily for 10 days. 20 tablet 0   ferrous sulfate 325 (65 FE) MG EC tablet Take 325 mg by mouth daily with breakfast.     finasteride (PROSCAR) 5 MG tablet Take 5 mg by mouth daily.     folic acid (FOLVITE) 1 MG tablet Take 2 tablets (2 mg total) by mouth daily. 180 tablet 3   gabapentin (NEURONTIN) 100 MG capsule Take 1 capsule (100 mg total) by mouth at bedtime. 30 capsule 0   hydrocortisone cream 0.5 % Apply 1 Application topically 3  (three) times daily. Apply small amount to affected area 3 times daily until healed 56 g 1   methotrexate (RHEUMATREX) 2.5 MG tablet TAKE 8 TABLETS BY MOUTH ONCE  WEEKLY . CAUTION: CHEMOTHERAPY.  PROTECTION FROM LIGHT 96 tablet 0   montelukast (SINGULAIR) 10 MG tablet TAKE 1 TABLET BY MOUTH ON THE  DAY PRIOR TO DARATUMUMAB  TREATMENT AND 1 TABLET BY MOUTH  DAILY FOR 2 DAYS AFTER TREATMENT 30 tablet 4   mupirocin cream (BACTROBAN) 2 % Apply 1 Application topically 2 (two) times daily. 30 g 0   tamsulosin (FLOMAX) 0.4 MG CAPS capsule Take 0.4 mg by mouth daily after supper.     No current facility-administered medications for this visit.   Facility-Administered Medications Ordered in Other Visits  Medication Dose Route Frequency Provider Last Rate Last Admin   acetaminophen (TYLENOL) 325 MG tablet            dexamethasone (DECADRON) 4 MG tablet            diphenhydrAMINE (BENADRYL) 25 mg capsule             VITAL SIGNS: There were no vitals taken for this visit. There were no vitals filed for this visit.  Estimated body mass index is 48.87 kg/m as calculated from the following:  Height as of 10/19/21: '5\' 8"'  (1.727 m).   Weight as of an earlier encounter on 11/23/21: 321 lb 6.9 oz (145.8 kg).  LABS: CBC:    Component Value Date/Time   WBC 8.0 11/23/2021 0814   HGB 11.9 (L) 11/23/2021 0814   HCT 36.5 (L) 11/23/2021 0814   PLT 226 11/23/2021 0814   MCV 93.8 11/23/2021 0814   NEUTROABS 4.8 11/23/2021 0814   LYMPHSABS 1.1 11/23/2021 0814   MONOABS 1.2 (H) 11/23/2021 0814   EOSABS 0.8 (H) 11/23/2021 0814   BASOSABS 0.1 11/23/2021 0814   Comprehensive Metabolic Panel:    Component Value Date/Time   NA 138 11/23/2021 0814   K 4.0 11/23/2021 0814   CL 106 11/23/2021 0814   CO2 24 11/23/2021 0814   BUN 15 11/23/2021 0814   CREATININE 1.00 11/23/2021 0814   CREATININE 1.07 03/14/2021 1218   GLUCOSE 108 (H) 11/23/2021 0814   CALCIUM 8.6 (L) 11/23/2021 0814   AST 15 11/23/2021 0814    ALT 8 11/23/2021 0814   ALKPHOS 68 11/23/2021 0814   BILITOT 0.5 11/23/2021 0814   PROT 6.6 11/23/2021 0814   ALBUMIN 3.6 11/23/2021 0814    RADIOGRAPHIC STUDIES: ECHOCARDIOGRAM COMPLETE  Result Date: 11/15/2021    ECHOCARDIOGRAM REPORT   Patient Name:   Delsa Sale Date of Exam: 11/15/2021 Medical Rec #:  [id number]         Height:       68.0 in Accession #:    [id number]        Weight:       326.0 lb Date of Birth:  1951-02-27         BSA:          2.515 m Patient Age:    13 years          BP:           131/68 mmHg Patient Gender: M                 HR:           63 bpm. Exam Location:  [facility] Procedure: 2D Echo, Color Doppler, Cardiac Doppler and Strain Analysis Indications:     Z09 Chemo                  M79.89 Leg swelling  History:         Patient has prior history of Echocardiogram examinations, most                  recent 06/07/2017. Prostate cancer, Multiple myeloma; Risk                  Factors:Former Smoker, Sleep Apnea, Hypertension and                  Dyslipidemia.  Sonographer:     Rosalia Hammers Referring Phys:  [id number] Hermleigh YU Diagnosing Phys: Ida Rogue MD  Sonographer Comments: Image acquisition challenging due to patient body habitus and Image acquisition challenging due to respiratory motion. Global longitudinal strain was attempted. IMPRESSIONS  1. Left ventricular ejection fraction, by estimation, is 60 to 65%. The left ventricle has normal function. The left ventricle has no regional wall motion abnormalities. Left ventricular diastolic parameters are consistent with Grade II diastolic dysfunction (pseudonormalization). The average left ventricular global longitudinal strain is -17.4 %.  2. Right ventricular systolic function is normal. The right ventricular size is normal. There is mildly elevated pulmonary artery systolic pressure. The  estimated right ventricular systolic pressure is 19.4 mmHg.  3. The mitral valve is normal in structure. Mild mitral valve regurgitation. No  evidence of mitral stenosis.  4. The aortic valve was not well visualized. Aortic valve regurgitation is not visualized. No aortic stenosis is present.  5. The inferior vena cava is normal in size with greater than 50% respiratory variability, suggesting right atrial pressure of 3 mmHg. FINDINGS  Left Ventricle: Left ventricular ejection fraction, by estimation, is 60 to 65%. The left ventricle has normal function. The left ventricle has no regional wall motion abnormalities. The average left ventricular global longitudinal strain is -17.4 %. The left ventricular internal cavity size was normal in size. There is no left ventricular hypertrophy. Left ventricular diastolic parameters are consistent with Grade II diastolic dysfunction (pseudonormalization). Right Ventricle: The right ventricular size is normal. No increase in right ventricular wall thickness. Right ventricular systolic function is normal. There is mildly elevated pulmonary artery systolic pressure. The tricuspid regurgitant velocity is 2.86  m/s, and with an assumed right atrial pressure of 5 mmHg, the estimated right ventricular systolic pressure is 17.4 mmHg. Left Atrium: Left atrial size was normal in size. Right Atrium: Right atrial size was normal in size. Pericardium: There is no evidence of pericardial effusion. Mitral Valve: The mitral valve is normal in structure. Mild mitral valve regurgitation. No evidence of mitral valve stenosis. Tricuspid Valve: The tricuspid valve is normal in structure. Tricuspid valve regurgitation is mild . No evidence of tricuspid stenosis. Aortic Valve: The aortic valve was not well visualized. Aortic valve regurgitation is not visualized. No aortic stenosis is present. Aortic valve mean gradient measures 4.0 mmHg. Aortic valve peak gradient measures 7.3 mmHg. Aortic valve area, by VTI measures 2.11 cm. Pulmonic Valve: The pulmonic valve was normal in structure. Pulmonic valve regurgitation is not visualized. No  evidence of pulmonic stenosis. Aorta: The aortic root is normal in size and structure. Venous: The inferior vena cava is normal in size with greater than 50% respiratory variability, suggesting right atrial pressure of 3 mmHg. IAS/Shunts: No atrial level shunt detected by color flow Doppler.  LEFT VENTRICLE PLAX 2D LVIDd:         4.68 cm   Diastology LVIDs:         3.15 cm   LV e' medial:    10.70 cm/s LV PW:         1.29 cm   LV E/e' medial:  9.9 LV IVS:        1.31 cm   LV e' lateral:   8.92 cm/s LVOT diam:     1.80 cm   LV E/e' lateral: 11.9 LV SV:         66 LV SV Index:   26        2D Longitudinal Strain LVOT Area:     2.54 cm  2D Strain GLS Avg:     -17.4 %                           3D Volume EF:                          3D EF:        62 %                          LV EDV:  239 ml                          LV ESV:       90 ml                          LV SV:        149 ml RIGHT VENTRICLE RV Basal diam:  3.12 cm RV Mid diam:    4.14 cm RV S prime:     12.90 cm/s TAPSE (M-mode): 2.5 cm LEFT ATRIUM             Index        RIGHT ATRIUM           Index LA diam:        3.70 cm 1.47 cm/m   RA Area:     14.40 cm LA Vol (A2C):   58.4 ml 23.22 ml/m  RA Volume:   32.00 ml  12.72 ml/m LA Vol (A4C):   68.1 ml 27.07 ml/m LA Biplane Vol: 63.4 ml 25.20 ml/m  AORTIC VALVE AV Area (Vmax):    2.07 cm AV Area (Vmean):   1.89 cm AV Area (VTI):     2.11 cm AV Vmax:           135.00 cm/s AV Vmean:          94.400 cm/s AV VTI:            0.311 m AV Peak Grad:      7.3 mmHg AV Mean Grad:      4.0 mmHg LVOT Vmax:         110.00 cm/s LVOT Vmean:        70.000 cm/s LVOT VTI:          0.258 m LVOT/AV VTI ratio: 0.83  AORTA Ao Root diam: 2.90 cm MITRAL VALVE                TRICUSPID VALVE MV Area (PHT): 3.13 cm     TR Peak grad:   32.7 mmHg MV Decel Time: 242 msec     TR Vmax:        286.00 cm/s MV E velocity: 106.00 cm/s MV A velocity: 90.00 cm/s   SHUNTS MV E/A ratio:  1.18         Systemic VTI:  0.26 m                              Systemic Diam: 1.80 cm Ida Rogue MD Electronically signed by Ida Rogue MD Signature Date/Time: 11/15/2021/12:51:33 PM    Final    US Venous Img Lower Bilateral  Result Date: 11/02/2021 CLINICAL DATA:  71 year old male with history of leg swelling. EXAM: BILATERAL LOWER EXTREMITY VENOUS DOPPLER ULTRASOUND TECHNIQUE: Gray-scale sonography with graded compression, as well as color Doppler and duplex ultrasound were performed to evaluate the lower extremity deep venous systems from the level of the common femoral vein and including the common femoral, femoral, profunda femoral, popliteal and calf veins including the posterior tibial, peroneal and gastrocnemius veins when visible. The superficial great saphenous vein was also interrogated. Spectral Doppler was utilized to evaluate flow at rest and with distal augmentation maneuvers in the common femoral, femoral and popliteal veins. COMPARISON:  None Available. FINDINGS: RIGHT LOWER EXTREMITY Common Femoral Vein: No evidence of thrombus. Normal compressibility, respiratory phasicity and  response to augmentation. Saphenofemoral Junction: No evidence of thrombus. Normal compressibility and flow on color Doppler imaging. Profunda Femoral Vein: No evidence of thrombus. Normal compressibility and flow on color Doppler imaging. Femoral Vein: No evidence of thrombus. Normal compressibility, respiratory phasicity and response to augmentation. Popliteal Vein: No evidence of thrombus. Normal compressibility, respiratory phasicity and response to augmentation. Calf Veins: No evidence of thrombus. Normal compressibility and flow on color Doppler imaging. Other Findings:  None. LEFT LOWER EXTREMITY Common Femoral Vein: No evidence of thrombus. Normal compressibility, respiratory phasicity and response to augmentation. Saphenofemoral Junction: No evidence of thrombus. Normal compressibility and flow on color Doppler imaging. Profunda Femoral Vein: No evidence of  thrombus. Normal compressibility and flow on color Doppler imaging. Femoral Vein: No evidence of thrombus. Normal compressibility, respiratory phasicity and response to augmentation. Popliteal Vein: No evidence of thrombus. Normal compressibility, respiratory phasicity and response to augmentation. Calf Veins: No evidence of thrombus. Normal compressibility and flow on color Doppler imaging. Other Findings:  None. IMPRESSION: No evidence of bilateral lower extremity deep venous thrombosis. Ruthann Cancer, MD [facility] Electronically Signed   By: Ruthann Cancer M.D.   On: 11/02/2021 14:23    PERFORMANCE STATUS (ECOG) : 1 - Symptomatic but completely ambulatory  Review of Systems Unless otherwise noted, a complete review of systems is negative.  Physical Exam General: NAD Cardiovascular: regular rate and rhythm Pulmonary: clear ant fields Abdomen: soft, nontender, + bowel sounds GU: no suprapubic tenderness Extremities: no edema, no joint deformities Skin: See below  Neurological: Weakness but otherwise nonfocal     Assessment and Plan- Patient is a 71 y.o. male    Encounter Diagnoses  Name Primary?   Skin lesion Yes   Encounter for antineoplastic chemotherapy    Multiple myeloma not having achieved remission ([place])    Immunocompromised state ([place])    Pruritic rash     The new area of concern looks to be healing but given his immunocompromised status will cover with doxycyline. Discussed with Dr. Tasia Catchings who recommends proceeding forward with treatment today. His itchy rash is difficult to visualize. Should this worsen he will alert [place]. For now continue Singulair.    Patient expressed understanding and was in agreement with this plan. He also understands that He can call clinic at any time with any questions, concerns, or complaints.   Thank you for allowing me to participate in the care of this very pleasant patient.   Time  Total: 15  Visit consisted of counseling and education dealing with the complex and emotionally intense issues of symptom management in the setting of serious illness.Greater than 50%  of this time was spent counseling and coordinating care related to the above assessment and plan.  Signed by: Nelwyn Salisbury, PA-C

## 2021-11-24 ENCOUNTER — Encounter: Payer: Self-pay | Admitting: Oncology

## 2021-11-24 ENCOUNTER — Telehealth: Payer: Self-pay | Admitting: *Deleted

## 2021-11-24 MED ORDER — LENALIDOMIDE 25 MG PO CAPS: 25.0000 mg | ORAL_CAPSULE | Freq: Every day | ORAL | 0 refills | Status: DC

## 2021-11-24 MED ORDER — LENALIDOMIDE 25 MG PO CAPS: ORAL_CAPSULE | ORAL | 0 refills | Status: DC

## 2021-11-24 NOTE — Telephone Encounter (Signed)
Revlimid prescription needs cycle info on it and resubmitted.. Done

## 2021-11-30 ENCOUNTER — Telehealth: Payer: Self-pay

## 2021-11-30 ENCOUNTER — Ambulatory Visit: Payer: Medicare Other | Admitting: Oncology

## 2021-11-30 ENCOUNTER — Inpatient Hospital Stay: Payer: Medicare Other

## 2021-11-30 ENCOUNTER — Other Ambulatory Visit: Payer: Medicare Other

## 2021-11-30 ENCOUNTER — Inpatient Hospital Stay (HOSPITAL_BASED_OUTPATIENT_CLINIC_OR_DEPARTMENT_OTHER): Payer: Medicare Other | Admitting: Oncology

## 2021-11-30 ENCOUNTER — Ambulatory Visit: Payer: Medicare Other

## 2021-11-30 ENCOUNTER — Encounter: Payer: Self-pay | Admitting: Oncology

## 2021-11-30 ENCOUNTER — Other Ambulatory Visit: Payer: Self-pay

## 2021-11-30 VITALS — BP 147/76 | HR 81 | Temp 98.7°F | Resp 20 | Wt 319.7 lb

## 2021-11-30 DIAGNOSIS — T451X5A Adverse effect of antineoplastic and immunosuppressive drugs, initial encounter: Secondary | ICD-10-CM | POA: Diagnosis not present

## 2021-11-30 DIAGNOSIS — C9000 Multiple myeloma not having achieved remission: Secondary | ICD-10-CM

## 2021-11-30 DIAGNOSIS — G629 Polyneuropathy, unspecified: Secondary | ICD-10-CM | POA: Diagnosis not present

## 2021-11-30 DIAGNOSIS — Z5112 Encounter for antineoplastic immunotherapy: Secondary | ICD-10-CM | POA: Diagnosis not present

## 2021-11-30 DIAGNOSIS — K521 Toxic gastroenteritis and colitis: Secondary | ICD-10-CM | POA: Diagnosis not present

## 2021-11-30 DIAGNOSIS — Z7982 Long term (current) use of aspirin: Secondary | ICD-10-CM | POA: Diagnosis not present

## 2021-11-30 DIAGNOSIS — M069 Rheumatoid arthritis, unspecified: Secondary | ICD-10-CM | POA: Diagnosis not present

## 2021-11-30 DIAGNOSIS — Z87891 Personal history of nicotine dependence: Secondary | ICD-10-CM | POA: Diagnosis not present

## 2021-11-30 DIAGNOSIS — D649 Anemia, unspecified: Secondary | ICD-10-CM | POA: Diagnosis not present

## 2021-11-30 DIAGNOSIS — M7989 Other specified soft tissue disorders: Secondary | ICD-10-CM

## 2021-11-30 DIAGNOSIS — D6481 Anemia due to antineoplastic chemotherapy: Secondary | ICD-10-CM | POA: Diagnosis not present

## 2021-11-30 DIAGNOSIS — Z79624 Long term (current) use of inhibitors of nucleotide synthesis: Secondary | ICD-10-CM | POA: Diagnosis not present

## 2021-11-30 DIAGNOSIS — I5189 Other ill-defined heart diseases: Secondary | ICD-10-CM

## 2021-11-30 DIAGNOSIS — Z5111 Encounter for antineoplastic chemotherapy: Secondary | ICD-10-CM | POA: Diagnosis not present

## 2021-11-30 DIAGNOSIS — R21 Rash and other nonspecific skin eruption: Secondary | ICD-10-CM | POA: Diagnosis not present

## 2021-11-30 DIAGNOSIS — Z7961 Long term (current) use of immunomodulator: Secondary | ICD-10-CM | POA: Diagnosis not present

## 2021-11-30 DIAGNOSIS — Z6841 Body Mass Index (BMI) 40.0 and over, adult: Secondary | ICD-10-CM

## 2021-11-30 DIAGNOSIS — Z79899 Other long term (current) drug therapy: Secondary | ICD-10-CM | POA: Diagnosis not present

## 2021-11-30 LAB — CBC WITH DIFFERENTIAL/PLATELET
Abs Immature Granulocytes: 0.02 10*3/uL (ref 0.00–0.07)
Basophils Absolute: 0.1 10*3/uL (ref 0.0–0.1)
Basophils Relative: 1 %
Eosinophils Absolute: 0.5 10*3/uL (ref 0.0–0.5)
Eosinophils Relative: 5 %
HCT: 37.3 % — ABNORMAL LOW (ref 39.0–52.0)
Hemoglobin: 12.1 g/dL — ABNORMAL LOW (ref 13.0–17.0)
Immature Granulocytes: 0 %
Lymphocytes Relative: 21 %
Lymphs Abs: 1.9 10*3/uL (ref 0.7–4.0)
MCH: 30.4 pg (ref 26.0–34.0)
MCHC: 32.4 g/dL (ref 30.0–36.0)
MCV: 93.7 fL (ref 80.0–100.0)
Monocytes Absolute: 0.9 10*3/uL (ref 0.1–1.0)
Monocytes Relative: 10 %
Neutro Abs: 5.7 10*3/uL (ref 1.7–7.7)
Neutrophils Relative %: 63 %
Platelets: 220 10*3/uL (ref 150–400)
RBC: 3.98 MIL/uL — ABNORMAL LOW (ref 4.22–5.81)
RDW: 15.6 % — ABNORMAL HIGH (ref 11.5–15.5)
WBC: 9.1 10*3/uL (ref 4.0–10.5)
nRBC: 0 % (ref 0.0–0.2)

## 2021-11-30 LAB — COMPREHENSIVE METABOLIC PANEL
ALT: 9 U/L (ref 0–44)
AST: 17 U/L (ref 15–41)
Albumin: 3.7 g/dL (ref 3.5–5.0)
Alkaline Phosphatase: 73 U/L (ref 38–126)
Anion gap: 4 — ABNORMAL LOW (ref 5–15)
BUN: 15 mg/dL (ref 8–23)
CO2: 24 mmol/L (ref 22–32)
Calcium: 8.7 mg/dL — ABNORMAL LOW (ref 8.9–10.3)
Chloride: 108 mmol/L (ref 98–111)
Creatinine, Ser: 0.98 mg/dL (ref 0.61–1.24)
GFR, Estimated: >60 mL/min (ref >60)
Glucose, Bld: 96 mg/dL (ref 70–99)
Potassium: 3.8 mmol/L (ref 3.5–5.1)
Sodium: 136 mmol/L (ref 135–145)
Total Bilirubin: 0.4 mg/dL (ref 0.3–1.2)
Total Protein: 6.6 g/dL (ref 6.5–8.1)

## 2021-11-30 MED ORDER — BORTEZOMIB CHEMO SQ INJECTION 3.5 MG (2.5MG/ML)
1.3000 mg/m2 | Freq: Once | INTRAMUSCULAR | Status: AC
  Administered 2021-11-30: 3.5 mg via SUBCUTANEOUS
  Filled 2021-11-30: qty 1.4

## 2021-11-30 MED ORDER — DARATUMUMAB-HYALURONIDASE-FIHJ 1800-30000 MG-UT/15ML ~~LOC~~ SOLN
1800.0000 mg | Freq: Once | SUBCUTANEOUS | Status: AC
  Administered 2021-11-30: 1800 mg via SUBCUTANEOUS
  Filled 2021-11-30: qty 15

## 2021-11-30 MED ORDER — ACETAMINOPHEN 325 MG PO TABS
650.0000 mg | ORAL_TABLET | Freq: Once | ORAL | Status: AC
  Administered 2021-11-30: 650 mg via ORAL
  Filled 2021-11-30: qty 2

## 2021-11-30 MED ORDER — DEXAMETHASONE 4 MG PO TABS
20.0000 mg | ORAL_TABLET | Freq: Once | ORAL | Status: AC
  Administered 2021-11-30: 20 mg via ORAL
  Filled 2021-11-30: qty 5

## 2021-11-30 MED ORDER — DIPHENHYDRAMINE HCL 25 MG PO CAPS
50.0000 mg | ORAL_CAPSULE | Freq: Once | ORAL | Status: AC
  Administered 2021-11-30: 50 mg via ORAL
  Filled 2021-11-30: qty 2

## 2021-11-30 NOTE — Assessment & Plan Note (Signed)
secondary to carpal tunnel disease and chemotherapy Slightly worse.  Grade 1/2 Patient did not tolerate gabapentin.  Monitor symptoms.

## 2021-11-30 NOTE — Progress Notes (Signed)
Hematology/Oncology Progress note [phone] [phone]      Patient Care Team: Eric Carol, MD as PCP - General (Internal Medicine)  ASSESSMENT & PLAN:   Cancer Staging  Multiple myeloma not having achieved remission [facility]) Staging form: Plasma Cell Myeloma and Plasma Cell Disorders, AJCC 8th Edition - Clinical stage from 04/22/2020: RISS Stage II (Beta-2-microglobulin (mg/L): 2.7, Albumin (g/dL): 3.1, ISS: Stage II, High-risk cytogenetics: Absent, LDH: Normal) - Signed by Eric Server, MD on 08/10/2021   Multiple myeloma not having achieved remission ([place]) IgA lambda multiple myeloma, 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 Proceed with cycle 5 day 1 Velcade Dara. Take Revlimid D1-14 He will proceed with weekly Velcade on D8 and D15 Singualair 38m 1 day prior to daratumumab treatments, 2 days after treatment.   Awaiting dental clearance. [place] [place] for evaluation.  Rheumatoid arthritis ([place] up with rheumatology.  Currently he is off Humira and methotrexate.  I agree with holding the treatment to avoid synergistic bone marrow suppression.  Symptom has been stable.  Leg swelling Bilateral lower extremity swelling, negative DVT on ultrasound lower extremities bilaterally start.  Echo showed normal LVEF, grade II diastolic heart dysfunction. Refer to cardiology  Morbid obesity with BMI of 45.0-49.9, adult ([place] Recommend life style modification, healthy diet and exercise if tolerating.   Encounter for antineoplastic chemotherapy Chemotherapy plan as listed above.  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  Stable.  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  Grade 1/2 Patient did not tolerate gabapentin.  Monitor symptoms.    Orders Placed This Encounter  Procedures   CBC with  Differential/Platelet    Standing Status:   Future    Number of Occurrences:   1    Standing Expiration Date:   12/01/2022   Comprehensive metabolic panel    Standing Status:   Future    Number of Occurrences:   1    Standing Expiration Date:   11/30/2022   Kappa/lambda light chains    Standing Status:   Future    Number of Occurrences:   1    Standing Expiration Date:   11/30/2022    Follow up  1 week lab dara-velcade 2 weeks lab Dara-velcade 3 weeks flex lab MD Dara-Velcade   All questions were answered. The patient knows to call the clinic with any problems, questions or concerns.  ZEarlie Server MD, PhD [facility] H[facility] Oncology 11/30/2021    CHIEF COMPLAINTS/REASON FOR VISIT:  Follow-up for multiple myeloma  HISTORY OF PRESENTING ILLNESS:  GJUELL RADNEYis a 71y.o. male presents for follow up of myeloma .  Oncology History  Multiple [place] not having achieved remission ([place]  04/22/2020 Initial Diagnosis   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  27% plasma cell, cytogenetics - normal, and myeloma FISH panel is negative.  Standard risk.  [place]ongo red staining was added and was negative.  - 07/27/2021, bone marrow biopsy showed hypercellular marrow involved by plasma cell myeloma, CD138 immunohistochemistry plasma cells compromise approximately 70% of the cellular elements and are lambda restricted by light chain in situ hybridization.  Cytogenetics showed duplication of 1q.  Cytogenetics is normal.   04/22/2020 Cancer Staging   Staging form: Plasma Cell Myeloma and Plasma Cell Disorders, AJCC 8th Edition - Clinical stage from 04/22/2020: RISS Stage II (Beta-2-microglobulin (mg/L):  2.7, Albumin (g/dL): 3.1, ISS: Stage II, High-risk cytogenetics: Absent, LDH: Normal) - Signed by Eric Server, MD on 08/10/2021 Stage prefix: Initial diagnosis Beta 2 microglobulin range (mg/L): Less than  3.5 Albumin range (g/dL): Less than 3.5 Cytogenetics: 1q addition Lactate dehydrogenase (LDH) (U/L): 136 Serum calcium level: Normal Serum creatinine level: Normal   08/12/2021 Imaging   PET scan showed 1. No evidence of active myeloma on skull base to thigh FDG PET scan. 2. No soft tissue plasmacytoma.3. No lytic or suspicious lesion on CT portion exam.     08/22/2021 -  Chemotherapy   Patient is on Treatment Plan : MYELOMA NEWLY DIAGNOSED TRANSPLANT CANDIDATE DaraVRd (Daratumumab SQ) q21d x 6 Cycles (Induction/Consolidation)     08/24/2021 - 11/09/2021 Chemotherapy   Patient is on Treatment Plan : MYELOMA NEWLY DIAGNOSED TRANSPLANT CANDIDATE DaraVRd (Daratumumab SQ) q21d x 6 Cycles (Induction/Consolidation)     11/02/2021 Imaging   Bilateral lower extremity US negative for DVT   11/15/2021 Echocardiogram   1. Left ventricular ejection fraction, by estimation, is 60 to 65%. The left ventricle has normal function. The left ventricle has no regional wall motion abnormalities. Left ventricular diastolic parameters are consistent with Grade II diastolic dysfunction (pseudonormalization). The average left ventricular global longitudinal strain is -17.4 %.   2. Right ventricular systolic function is normal. The right ventricular size is normal. There is mildly elevated pulmonary artery systolic pressure. The estimated right ventricular systolic pressure is 64.3 mmHg.   3. The mitral valve is normal in structure. Mild mitral valve regurgitation. No evidence of mitral stenosis.  4. The aortic valve was not well visualized. Aortic valve regurgitation  is not visualized. No aortic stenosis is present.   5. The inferior vena cava is normal in size with greater than 50% respiratory variability, suggesting right atrial pressure of 3 mmHg.     For rheumatoid arthritis, patient was seen by Dr. Estanislado Cruz recently and is currently off methotrexate and Humira.  + Bilateral lower extremity swelling.[place] negative  for DVT  INTERVAL HISTORY Eric Cruz is a 71 y.o. male who has above history reviewed by me today presents for follow up visit for management of multiple myeloma He feels well.  Does not tolerate gabapentin.  He took a dose and experienced right lower extremity shooting pain Fingertip numbness and tingling symptoms are stable.  No shortness of breath, fever, nausea vomiting.     Review of Systems  Constitutional:  Positive for fatigue. Negative for appetite change, chills, fever and unexpected weight change.  HENT:   Negative for hearing loss and voice change.   Eyes:  Negative for eye problems and icterus.  Respiratory:  Negative for chest tightness, cough and shortness of breath.   Cardiovascular:  Negative for chest pain and leg swelling.  Gastrointestinal:  Negative for abdominal distention and abdominal pain.  Endocrine: Negative for hot flashes.  Genitourinary:  Negative for difficulty urinating, dysuria and frequency.   Musculoskeletal:  Positive for arthralgias and back pain.  Skin:  Negative for itching and rash.  Neurological:  Negative for light-headedness and numbness.  Hematological:  Negative for adenopathy. Does not bruise/bleed easily.  Psychiatric/Behavioral:  Negative for confusion.      MEDICAL HISTORY:  Past Medical History:  Diagnosis Date   Arthritis    BPH (benign prostatic hyperplasia)    Cancer (HCC)    Prostate: states he had a positive biopsy but had another one a year later and it was gone.    Depression  Dizziness    GERD (gastroesophageal reflux disease)    Gout    High cholesterol    Hypertension    Sleep apnea    has cpap - not currently wearing   Varicose veins     SURGICAL HISTORY: Past Surgical History:  Procedure Laterality Date   ANTERIOR CERVICAL DECOMP/DISCECTOMY FUSION N/A 02/21/2018   Procedure: Cervical Five-Six Cervical Six-Seven Anterior cervical decompression/discectomy/fusion;  Surgeon: Ashok Pall, MD;   Location: [place];  Service: Neurosurgery;  Laterality: N/A;  Cervical Five-Six Cervical Six-Seven Anterior cervical decompression/discectomy/fusion   BIOPSY  10/09/2019   Procedure: BIOPSY;  Surgeon: Ronnette Juniper, MD;  Location: [facility];  Service: Gastroenterology;;   BLADDER SURGERY     CARPAL TUNNEL RELEASE Right 02/11/2019   Procedure: Right Carpal Tunnel Wound Irrigation;  Surgeon: Ashok Pall, MD;  Location: [place];  Service: Neurosurgery;  Laterality: Right;  Right Carpal tunnel wound exploration/wash out   CARPAL TUNNEL RELEASE Bilateral    CHOLECYSTECTOMY  11/22/2012   CHOLECYSTECTOMY  11/22/2012   Procedure: LAPAROSCOPIC CHOLECYSTECTOMY;  Surgeon: Gwenyth Ober, MD;  Location: [place];  Service: General;;   COLONOSCOPY     COLONOSCOPY WITH PROPOFOL N/A 10/09/2019   Procedure: COLONOSCOPY WITH PROPOFOL;  Surgeon: Ronnette Juniper, MD;  Location: [facility];  Service: Gastroenterology;  Laterality: N/A;   ESOPHAGOGASTRODUODENOSCOPY (EGD) WITH PROPOFOL N/A 10/09/2019   Procedure: ESOPHAGOGASTRODUODENOSCOPY (EGD) WITH PROPOFOL;  Surgeon: Ronnette Juniper, MD;  Location: [facility];  Service: Gastroenterology;  Laterality: N/A;   POLYPECTOMY  10/09/2019   Procedure: POLYPECTOMY;  Surgeon: Ronnette Juniper, MD;  Location: [facility];  Service: Gastroenterology;;    SOCIAL HISTORY: Social History   Socioeconomic History   Marital status: Married    Spouse name: Not on file   Number of children: Not on file   Years of education: Not on file   Highest education level: Not on file  Occupational History   Not on file  Tobacco Use   Smoking status: Former    Packs/day: 1.00    Years: 31.00    Total pack years: 31.00    Types: Cigarettes    Quit date: 04/12/1998    Years since quitting: 23.6    Passive exposure: Never   Smokeless tobacco: Never  Vaping Use   Vaping Use: Never used  Substance and Sexual Activity   Alcohol use: No   Drug use: No   Sexual activity: Not on file  Other Topics  Concern   Not on file  Social History Narrative   Not on file   Social Determinants of Health   Financial Resource Strain: Not on file  Food Insecurity: Not on file  Transportation Needs: Not on file  Physical Activity: Not on file  Stress: Not on file  Social Connections: Not on file  Intimate Partner Violence: Not on file    FAMILY HISTORY: Family History  Problem Relation Age of Onset   Hypertension Mother    Diabetes Mother    Multiple myeloma Mother    Hypertension Father    Diabetes Father    Healthy Son    Healthy Daughter     ALLERGIES:  is allergic to atorvastatin, simvastatin, other, and statins.  MEDICATIONS:  Current Outpatient Medications  Medication Sig Dispense Refill   doxycycline (VIBRA-TABS) 100 MG tablet Take 1 tablet (100 mg total) by mouth 2 (two) times daily for 10 days. 20 tablet 0   ferrous sulfate 325 (65 FE) MG EC tablet Take 325 mg by mouth  daily with breakfast.     finasteride (PROSCAR) 5 MG tablet Take 5 mg by mouth daily.     folic acid (FOLVITE) 1 MG tablet Take 2 tablets (2 mg total) by mouth daily. 180 tablet 3   hydrocortisone cream 0.5 % Apply 1 Application topically 3 (three) times daily. Apply small amount to affected area 3 times daily until healed 56 g 1   lenalidomide (REVLIMID) 25 MG capsule Take for 14 days on, 7 days off, repeat every 21 days. 14 capsule 0   methotrexate (RHEUMATREX) 2.5 MG tablet TAKE 8 TABLETS BY MOUTH ONCE  WEEKLY . CAUTION: CHEMOTHERAPY.  PROTECTION FROM LIGHT 96 tablet 0   montelukast (SINGULAIR) 10 MG tablet TAKE 1 TABLET BY MOUTH ON THE  DAY PRIOR TO DARATUMUMAB  TREATMENT AND 1 TABLET BY MOUTH  DAILY FOR 2 DAYS AFTER TREATMENT 30 tablet 4   mupirocin cream (BACTROBAN) 2 % Apply 1 Application topically 2 (two) times daily. 30 g 0   tamsulosin (FLOMAX) 0.4 MG CAPS capsule Take 0.4 mg by mouth daily after supper.     Adalimumab (HUMIRA PEN) 40 MG/0.4ML PNKT Inject 40 mg into the skin every 14 (fourteen) days.  (Patient not taking: Reported on 08/24/2021) 2 each 2   gabapentin (NEURONTIN) 100 MG capsule Take 1 capsule (100 mg total) by mouth at bedtime. (Patient not taking: Reported on 11/30/2021) 30 capsule 0   No current facility-administered medications for this visit.   Facility-Administered Medications Ordered in Other Visits  Medication Dose Route Frequency Provider Last Rate Last Admin   acetaminophen (TYLENOL) 325 MG tablet            dexamethasone (DECADRON) 4 MG tablet            diphenhydrAMINE (BENADRYL) 25 mg capsule              PHYSICAL EXAMINATION: ECOG PERFORMANCE STATUS: 1 - Symptomatic but completely ambulatory Vitals:   11/30/21 0856  BP: (!) 147/76  Pulse: 81  Resp: 20  Temp: 98.7 F (37.1 C)  SpO2: 98%   Filed Weights   11/30/21 0856  Weight: (!) 319 lb 11.2 oz (145 kg)    Physical Exam Constitutional:      General: He is not in acute distress.    Appearance: He is obese.     Comments: Patient walks with a cane.  HENT:     Head: Normocephalic and atraumatic.  Eyes:     General: No scleral icterus. Cardiovascular:     Rate and Rhythm: Normal rate and regular rhythm.     Heart sounds: Normal heart sounds.  Pulmonary:     Effort: Pulmonary effort is normal. No respiratory distress.     Breath sounds: No wheezing.  Abdominal:     General: Bowel sounds are normal. There is no distension.     Palpations: Abdomen is soft.  Musculoskeletal:        General: No deformity. Normal range of motion.     Cervical back: Normal range of motion and neck supple.     Comments: Trace edema, bilateral low extremities.   Skin:    General: Skin is warm and dry.     Findings: No erythema or rash.  Neurological:     Mental Status: He is alert and oriented to person, place, and time. Mental status is at baseline.     Cranial Nerves: No cranial nerve deficit.     Coordination: Coordination normal.  Psychiatric:  Mood and Affect: Mood normal.      LABORATORY  DATA:  I have reviewed the data as listed Lab Results  Component Value Date   WBC 9.1 11/30/2021   HGB 12.1 (L) 11/30/2021   HCT 37.3 (L) 11/30/2021   MCV 93.7 11/30/2021   PLT 220 11/30/2021   Recent Labs    11/16/21 0803 11/23/21 0814 11/30/21 0834  NA 136 138 136  K 3.9 4.0 3.8  CL 106 106 108  CO2 '25 24 24  ' GLUCOSE 90 108* 96  BUN '9 15 15  ' CREATININE 0.90 1.00 0.98  CALCIUM 8.4* 8.6* 8.7*  GFRNONAA >60 >60 >60  PROT 6.5 6.6 6.6  ALBUMIN 3.6 3.6 3.7  AST '16 15 17  ' ALT '8 8 9  ' ALKPHOS 68 68 73  BILITOT 0.5 0.5 0.4    Iron/TIBC/Ferritin/ %Sat    Component Value Date/Time   IRON 78 04/05/2021 1008   TIBC 274 04/05/2021 1008   FERRITIN 425 (H) 04/05/2021 1008   IRONPCTSAT 28 04/05/2021 1008      RADIOGRAPHIC STUDIES: I have personally reviewed the radiological images as listed and agreed with the findings in the report.  ECHOCARDIOGRAM COMPLETE  Result Date: 11/15/2021    ECHOCARDIOGRAM REPORT   Patient Name:   Eric Cruz Date of Exam: 11/15/2021 Medical Rec #:  [id number]         Height:       68.0 in Accession #:    [id number]        Weight:       326.0 lb Date of Birth:  1950-09-21         BSA:          2.515 m Patient Age:    71 years          BP:           131/68 mmHg Patient Gender: M                 HR:           63 bpm. Exam Location:  [facility] Procedure: 2D Echo, Color Doppler, Cardiac Doppler and Strain Analysis Indications:     Z09 Chemo                  M79.89 Leg swelling  History:         Patient has prior history of Echocardiogram examinations, most                  recent 06/07/2017. Prostate cancer, Multiple myeloma; Risk                  Factors:Former Smoker, Sleep Apnea, Hypertension and                  Dyslipidemia.  Sonographer:     Rosalia Hammers Referring Phys:  [id number] [place] Binh Doten Diagnosing Phys: Ida Rogue MD  Sonographer Comments: Image acquisition challenging due to patient body habitus and Image acquisition challenging due to respiratory  motion. Global longitudinal strain was attempted. IMPRESSIONS  1. Left ventricular ejection fraction, by estimation, is 60 to 65%. The left ventricle has normal function. The left ventricle has no regional wall motion abnormalities. Left ventricular diastolic parameters are consistent with Grade II diastolic dysfunction (pseudonormalization). The average left ventricular global longitudinal strain is -17.4 %.  2. Right ventricular systolic function is normal. The right ventricular size is normal. There is mildly elevated pulmonary artery systolic pressure. The estimated right  ventricular systolic pressure is 01.7 mmHg.  3. The mitral valve is normal in structure. Mild mitral valve regurgitation. No evidence of mitral stenosis.  4. The aortic valve was not well visualized. Aortic valve regurgitation is not visualized. No aortic stenosis is present.  5. The inferior vena cava is normal in size with greater than 50% respiratory variability, suggesting right atrial pressure of 3 mmHg. FINDINGS  Left Ventricle: Left ventricular ejection fraction, by estimation, is 60 to 65%. The left ventricle has normal function. The left ventricle has no regional wall motion abnormalities. The average left ventricular global longitudinal strain is -17.4 %. The left ventricular internal cavity size was normal in size. There is no left ventricular hypertrophy. Left ventricular diastolic parameters are consistent with Grade II diastolic dysfunction (pseudonormalization). Right Ventricle: The right ventricular size is normal. No increase in right ventricular wall thickness. Right ventricular systolic function is normal. There is mildly elevated pulmonary artery systolic pressure. The tricuspid regurgitant velocity is 2.86  m/s, and with an assumed right atrial pressure of 5 mmHg, the estimated right ventricular systolic pressure is 79.3 mmHg. Left Atrium: Left atrial size was normal in size. Right Atrium: Right atrial size was normal in  size. Pericardium: There is no evidence of pericardial effusion. Mitral Valve: The mitral valve is normal in structure. Mild mitral valve regurgitation. No evidence of mitral valve stenosis. Tricuspid Valve: The tricuspid valve is normal in structure. Tricuspid valve regurgitation is mild . No evidence of tricuspid stenosis. Aortic Valve: The aortic valve was not well visualized. Aortic valve regurgitation is not visualized. No aortic stenosis is present. Aortic valve mean gradient measures 4.0 mmHg. Aortic valve peak gradient measures 7.3 mmHg. Aortic valve area, by VTI measures 2.11 cm. Pulmonic Valve: The pulmonic valve was normal in structure. Pulmonic valve regurgitation is not visualized. No evidence of pulmonic stenosis. Aorta: The aortic root is normal in size and structure. Venous: The inferior vena cava is normal in size with greater than 50% respiratory variability, suggesting right atrial pressure of 3 mmHg. IAS/Shunts: No atrial level shunt detected by color flow Doppler.  LEFT VENTRICLE PLAX 2D LVIDd:         4.68 cm   Diastology LVIDs:         3.15 cm   LV e' medial:    10.70 cm/s LV PW:         1.29 cm   LV E/e' medial:  9.9 LV IVS:        1.31 cm   LV e' lateral:   8.92 cm/s LVOT diam:     1.80 cm   LV E/e' lateral: 11.9 LV SV:         66 LV SV Index:   26        2D Longitudinal Strain LVOT Area:     2.54 cm  2D Strain GLS Avg:     -17.4 %                           3D Volume EF:                          3D EF:        62 %                          LV EDV:       239 ml  LV ESV:       90 ml                          LV SV:        149 ml RIGHT VENTRICLE RV Basal diam:  3.12 cm RV Mid diam:    4.14 cm RV S prime:     12.90 cm/s TAPSE (M-mode): 2.5 cm LEFT ATRIUM             Index        RIGHT ATRIUM           Index LA diam:        3.70 cm 1.47 cm/m   RA Area:     14.40 cm LA Vol (A2C):   58.4 ml 23.22 ml/m  RA Volume:   32.00 ml  12.72 ml/m LA Vol (A4C):   68.1 ml 27.07 ml/m  LA Biplane Vol: 63.4 ml 25.20 ml/m  AORTIC VALVE AV Area (Vmax):    2.07 cm AV Area (Vmean):   1.89 cm AV Area (VTI):     2.11 cm AV Vmax:           135.00 cm/s AV Vmean:          94.400 cm/s AV VTI:            0.311 m AV Peak Grad:      7.3 mmHg AV Mean Grad:      4.0 mmHg LVOT Vmax:         110.00 cm/s LVOT Vmean:        70.000 cm/s LVOT VTI:          0.258 m LVOT/AV VTI ratio: 0.83  AORTA Ao Root diam: 2.90 cm MITRAL VALVE                TRICUSPID VALVE MV Area (PHT): 3.13 cm     TR Peak grad:   32.7 mmHg MV Decel Time: 242 msec     TR Vmax:        286.00 cm/s MV E velocity: 106.00 cm/s MV A velocity: 90.00 cm/s   SHUNTS MV E/A ratio:  1.18         Systemic VTI:  0.26 m                             Systemic Diam: 1.80 cm Ida Rogue MD Electronically signed by Ida Rogue MD Signature Date/Time: 11/15/2021/12:51:33 PM    Final    US Venous Img Lower Bilateral  Result Date: 11/02/2021 CLINICAL DATA:  71 year old male with history of leg swelling. EXAM: BILATERAL LOWER EXTREMITY VENOUS DOPPLER ULTRASOUND TECHNIQUE: Gray-scale sonography with graded compression, as well as color Doppler and duplex ultrasound were performed to evaluate the lower extremity deep venous systems from the level of the common femoral vein and including the common femoral, femoral, profunda femoral, popliteal and calf veins including the posterior tibial, peroneal and gastrocnemius veins when visible. The superficial great saphenous vein was also interrogated. Spectral Doppler was utilized to evaluate flow at rest and with distal augmentation maneuvers in the common femoral, femoral and popliteal veins. COMPARISON:  None Available. FINDINGS: RIGHT LOWER EXTREMITY Common Femoral Vein: No evidence of thrombus. Normal compressibility, respiratory phasicity and response to augmentation. Saphenofemoral Junction: No evidence of thrombus. Normal compressibility and flow on color Doppler imaging. Profunda Femoral Vein: No evidence of  thrombus. Normal compressibility  and flow on color Doppler imaging. Femoral Vein: No evidence of thrombus. Normal compressibility, respiratory phasicity and response to augmentation. Popliteal Vein: No evidence of thrombus. Normal compressibility, respiratory phasicity and response to augmentation. Calf Veins: No evidence of thrombus. Normal compressibility and flow on color Doppler imaging. Other Findings:  None. LEFT LOWER EXTREMITY Common Femoral Vein: No evidence of thrombus. Normal compressibility, respiratory phasicity and response to augmentation. Saphenofemoral Junction: No evidence of thrombus. Normal compressibility and flow on color Doppler imaging. Profunda Femoral Vein: No evidence of thrombus. Normal compressibility and flow on color Doppler imaging. Femoral Vein: No evidence of thrombus. Normal compressibility, respiratory phasicity and response to augmentation. Popliteal Vein: No evidence of thrombus. Normal compressibility, respiratory phasicity and response to augmentation. Calf Veins: No evidence of thrombus. Normal compressibility and flow on color Doppler imaging. Other Findings:  None. IMPRESSION: No evidence of bilateral lower extremity deep venous thrombosis. Ruthann Cancer, MD [facility] Electronically Signed   By: Ruthann Cancer M.D.   On: 11/02/2021 14:23

## 2021-11-30 NOTE — Telephone Encounter (Signed)
Spoke to pt and he states he is scheduled to see cardiology on 9/8.

## 2021-11-30 NOTE — Assessment & Plan Note (Signed)
Bilateral lower extremity swelling, negative DVT on ultrasound lower extremities bilaterally start.  Echo showed normal LVEF, grade II diastolic heart dysfunction. Refer to cardiology

## 2021-11-30 NOTE — Assessment & Plan Note (Signed)
follow up with rheumatology.  Currently he is off Humira and methotrexate.  I agree with holding the treatment to avoid synergistic bone marrow suppression.  Symptom has been stable.

## 2021-11-30 NOTE — Assessment & Plan Note (Signed)
secondary to chemotherapy, and chronic disease.  Stable.

## 2021-11-30 NOTE — Patient Instructions (Signed)
[facility] CTR AT [place]  Discharge Instructions: Thank you for choosing [facility] to provide your oncology and hematology care.  If you have a lab appointment with the [facility], please go directly to the [place] and check in at the registration area.  Wear comfortable clothing and clothing appropriate for easy access to any Portacath or PICC line.   We strive to give you quality time with your provider. You may need to reschedule your appointment if you arrive late (15 or more minutes).  Arriving late affects you and other patients whose appointments are after yours.  Also, if you miss three or more appointments without notifying the office, you may be dismissed from the clinic at the provider's discretion.      For prescription refill requests, have your pharmacy contact our office and allow 72 hours for refills to be completed.    Today you received the following chemotherapy and/or immunotherapy agents Darzalex & Velcade      To help prevent nausea and vomiting after your treatment, we encourage you to take your nausea medication as directed.  BELOW ARE SYMPTOMS THAT SHOULD BE REPORTED IMMEDIATELY: *FEVER GREATER THAN 100.4 F (38 C) OR HIGHER *CHILLS OR SWEATING *NAUSEA AND VOMITING THAT IS NOT CONTROLLED WITH YOUR NAUSEA MEDICATION *UNUSUAL SHORTNESS OF BREATH *UNUSUAL BRUISING OR BLEEDING *URINARY PROBLEMS (pain or burning when urinating, or frequent urination) *BOWEL PROBLEMS (unusual diarrhea, constipation, pain near the anus) TENDERNESS IN MOUTH AND THROAT WITH OR WITHOUT PRESENCE OF ULCERS (sore throat, sores in mouth, or a toothache) UNUSUAL RASH, SWELLING OR PAIN  UNUSUAL VAGINAL DISCHARGE OR ITCHING   Items with * indicate a potential emergency and should be followed up as soon as possible or go to the Emergency Department if any problems should occur.  Please show the CHEMOTHERAPY ALERT CARD or IMMUNOTHERAPY ALERT CARD at  check-in to the Emergency Department and triage nurse.  Should you have questions after your visit or need to cancel or reschedule your appointment, please contact [facility] CANCER [place] AT [place]  [phone] and follow the prompts.  Office hours are 8:00 a.m. to 4:30 p.m. Monday - Friday. Please note that voicemails left after 4:00 p.m. may not be returned until the following business day.  We are closed weekends and major holidays. You have access to a nurse at all times for urgent questions. Please call the main number to the clinic [phone] and follow the prompts.  For any non-urgent questions, you may also contact your provider using MyChart. We now offer e-Visits for anyone 53 and older to request care online for non-urgent symptoms. For details visit [email or web address].   Also download the MyChart app! Go to the app store, search "MyChart", open the app, select [place], and log in with your MyChart username and password.  Masks are optional in the cancer centers. If you would like for your care team to wear a mask while they are taking care of you, please let them know. For doctor visits, patients may have with them one support person who is at least 69 years old. At this time, visitors are not allowed in the infusion area.

## 2021-11-30 NOTE — Assessment & Plan Note (Addendum)
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 Proceed with cycle 5 day 1 Velcade Dara. Take Revlimid D1-14 He will proceed with weekly Velcade on D8 and D15 Singualair 37m 1 day prior to daratumumab treatments, 2 days after treatment.   Awaiting dental clearance. [facility]/bone marrow transplant team for evaluation.

## 2021-11-30 NOTE — Telephone Encounter (Signed)
Per Dr. Tasia Catchings: She has reviewed Echo and recommends pt to establish care with cardiology for further evaluation. Lower extremity swelling may be due to decreased heart function.   Unable to talke to pt but detailed message was left and callback number provided for questions.   Referral to cardiology has been placed in EPIC.

## 2021-11-30 NOTE — Assessment & Plan Note (Signed)
Recommend life style modification, healthy diet and exercise if tolerating.

## 2021-11-30 NOTE — Assessment & Plan Note (Signed)
Chemotherapy plan as listed above 

## 2021-12-01 ENCOUNTER — Other Ambulatory Visit (HOSPITAL_COMMUNITY): Payer: Self-pay

## 2021-12-01 LAB — KAPPA/LAMBDA LIGHT CHAINS
Kappa free light chain: 14.4 mg/L (ref 3.3–19.4)
Kappa, lambda light chain ratio: 1.92 — ABNORMAL HIGH (ref 0.26–1.65)
Lambda free light chains: 7.5 mg/L (ref 5.7–26.3)

## 2021-12-06 LAB — MULTIPLE MYELOMA PANEL, SERUM
Albumin SerPl Elph-Mcnc: 3.8 g/dL (ref 2.9–4.4)
Albumin/Glob SerPl: 1.5 (ref 0.7–1.7)
Alpha 1: 0.2 g/dL (ref 0.0–0.4)
Alpha2 Glob SerPl Elph-Mcnc: 0.7 g/dL (ref 0.4–1.0)
B-Globulin SerPl Elph-Mcnc: 1 g/dL (ref 0.7–1.3)
Gamma Glob SerPl Elph-Mcnc: 0.7 g/dL (ref 0.4–1.8)
Globulin, Total: 2.6 g/dL (ref 2.2–3.9)
IgA: 97 mg/dL (ref 61–437)
IgG (Immunoglobin G), Serum: 792 mg/dL (ref 603–1613)
IgM (Immunoglobulin M), Srm: 62 mg/dL (ref 20–172)
M Protein SerPl Elph-Mcnc: 0.2 g/dL — ABNORMAL HIGH
Total Protein ELP: 6.4 g/dL (ref 6.0–8.5)

## 2021-12-07 ENCOUNTER — Inpatient Hospital Stay: Payer: Medicare Other | Attending: Oncology | Admitting: *Deleted

## 2021-12-07 ENCOUNTER — Ambulatory Visit: Payer: Medicare Other | Admitting: Oncology

## 2021-12-07 ENCOUNTER — Inpatient Hospital Stay: Payer: Medicare Other

## 2021-12-07 VITALS — BP 130/80 | HR 89 | Temp 97.4°F | Resp 18 | Ht 68.0 in | Wt 324.7 lb

## 2021-12-07 DIAGNOSIS — Z79899 Other long term (current) drug therapy: Secondary | ICD-10-CM | POA: Insufficient documentation

## 2021-12-07 DIAGNOSIS — Z7961 Long term (current) use of immunomodulator: Secondary | ICD-10-CM | POA: Insufficient documentation

## 2021-12-07 DIAGNOSIS — T451X5A Adverse effect of antineoplastic and immunosuppressive drugs, initial encounter: Secondary | ICD-10-CM | POA: Insufficient documentation

## 2021-12-07 DIAGNOSIS — Z7952 Long term (current) use of systemic steroids: Secondary | ICD-10-CM | POA: Diagnosis not present

## 2021-12-07 DIAGNOSIS — G620 Drug-induced polyneuropathy: Secondary | ICD-10-CM | POA: Insufficient documentation

## 2021-12-07 DIAGNOSIS — C9000 Multiple myeloma not having achieved remission: Secondary | ICD-10-CM

## 2021-12-07 DIAGNOSIS — D6481 Anemia due to antineoplastic chemotherapy: Secondary | ICD-10-CM | POA: Insufficient documentation

## 2021-12-07 DIAGNOSIS — Z87891 Personal history of nicotine dependence: Secondary | ICD-10-CM | POA: Diagnosis not present

## 2021-12-07 DIAGNOSIS — Z7982 Long term (current) use of aspirin: Secondary | ICD-10-CM | POA: Diagnosis not present

## 2021-12-07 DIAGNOSIS — Z5112 Encounter for antineoplastic immunotherapy: Secondary | ICD-10-CM | POA: Diagnosis not present

## 2021-12-07 LAB — CBC WITH DIFFERENTIAL/PLATELET
Abs Immature Granulocytes: 0.05 10*3/uL (ref 0.00–0.07)
Basophils Absolute: 0 10*3/uL (ref 0.0–0.1)
Basophils Relative: 0 %
Eosinophils Absolute: 1.3 10*3/uL — ABNORMAL HIGH (ref 0.0–0.5)
Eosinophils Relative: 15 %
HCT: 36.7 % — ABNORMAL LOW (ref 39.0–52.0)
Hemoglobin: 12 g/dL — ABNORMAL LOW (ref 13.0–17.0)
Immature Granulocytes: 1 %
Lymphocytes Relative: 12 %
Lymphs Abs: 1 10*3/uL (ref 0.7–4.0)
MCH: 30.5 pg (ref 26.0–34.0)
MCHC: 32.7 g/dL (ref 30.0–36.0)
MCV: 93.4 fL (ref 80.0–100.0)
Monocytes Absolute: 0.4 10*3/uL (ref 0.1–1.0)
Monocytes Relative: 5 %
Neutro Abs: 5.9 10*3/uL (ref 1.7–7.7)
Neutrophils Relative %: 67 %
Platelets: 175 10*3/uL (ref 150–400)
RBC: 3.93 MIL/uL — ABNORMAL LOW (ref 4.22–5.81)
RDW: 15.9 % — ABNORMAL HIGH (ref 11.5–15.5)
WBC: 8.7 10*3/uL (ref 4.0–10.5)
nRBC: 0 % (ref 0.0–0.2)

## 2021-12-07 LAB — COMPREHENSIVE METABOLIC PANEL
ALT: 10 U/L (ref 0–44)
AST: 17 U/L (ref 15–41)
Albumin: 3.7 g/dL (ref 3.5–5.0)
Alkaline Phosphatase: 74 U/L (ref 38–126)
Anion gap: 7 (ref 5–15)
BUN: 14 mg/dL (ref 8–23)
CO2: 24 mmol/L (ref 22–32)
Calcium: 8.8 mg/dL — ABNORMAL LOW (ref 8.9–10.3)
Chloride: 108 mmol/L (ref 98–111)
Creatinine, Ser: 1.01 mg/dL (ref 0.61–1.24)
GFR, Estimated: >60 mL/min (ref >60)
Glucose, Bld: 109 mg/dL — ABNORMAL HIGH (ref 70–99)
Potassium: 3.7 mmol/L (ref 3.5–5.1)
Sodium: 139 mmol/L (ref 135–145)
Total Bilirubin: 0.3 mg/dL (ref 0.3–1.2)
Total Protein: 6.5 g/dL (ref 6.5–8.1)

## 2021-12-07 MED ORDER — BORTEZOMIB CHEMO SQ INJECTION 3.5 MG (2.5MG/ML)
1.3000 mg/m2 | Freq: Once | INTRAMUSCULAR | Status: AC
  Administered 2021-12-07: 3.5 mg via SUBCUTANEOUS
  Filled 2021-12-07: qty 1.4

## 2021-12-07 MED ORDER — PROCHLORPERAZINE MALEATE 10 MG PO TABS
10.0000 mg | ORAL_TABLET | Freq: Once | ORAL | Status: AC
  Administered 2021-12-07: 10 mg via ORAL
  Filled 2021-12-07: qty 1

## 2021-12-07 NOTE — Patient Instructions (Signed)
[facility] AT [place]  Discharge Instructions: Thank you for choosing [facility] to provide your oncology and hematology care.  If you have a lab appointment with the Cancer Center, please go directly to the Cancer Center and check in at the registration area.  Wear comfortable clothing and clothing appropriate for easy access to any Portacath or PICC line.   We strive to give you quality time with your provider. You may need to reschedule your appointment if you arrive late (15 or more minutes).  Arriving late affects you and other patients whose appointments are after yours.  Also, if you miss three or more appointments without notifying the office, you may be dismissed from the clinic at the provider's discretion.      For prescription refill requests, have your pharmacy contact our office and allow 72 hours for refills to be completed.    Today you received the following chemotherapy and/or immunotherapy agents VELCADE      To help prevent nausea and vomiting after your treatment, we encourage you to take your nausea medication as directed.  BELOW ARE SYMPTOMS THAT SHOULD BE REPORTED IMMEDIATELY: *FEVER GREATER THAN 100.4 F (38 C) OR HIGHER *CHILLS OR SWEATING *NAUSEA AND VOMITING THAT IS NOT CONTROLLED WITH YOUR NAUSEA MEDICATION *UNUSUAL SHORTNESS OF BREATH *UNUSUAL BRUISING OR BLEEDING *URINARY PROBLEMS (pain or burning when urinating, or frequent urination) *BOWEL PROBLEMS (unusual diarrhea, constipation, pain near the anus) TENDERNESS IN MOUTH AND THROAT WITH OR WITHOUT PRESENCE OF ULCERS (sore throat, sores in mouth, or a toothache) UNUSUAL RASH, SWELLING OR PAIN  UNUSUAL VAGINAL DISCHARGE OR ITCHING   Items with * indicate a potential emergency and should be followed up as soon as possible or go to the Emergency Department if any problems should occur.  Please show the CHEMOTHERAPY ALERT CARD or IMMUNOTHERAPY ALERT CARD at check-in to the  Emergency Department and triage nurse.  Should you have questions after your visit or need to cancel or reschedule your appointment, please contact [facility] AT [place]  [phone] and follow the prompts.  Office hours are 8:00 a.m. to 4:30 p.m. Monday - Friday. Please note that voicemails left after 4:00 p.m. may not be returned until the following business day.  We are closed weekends and major holidays. You have access to a nurse at all times for urgent questions. Please call the main number to the clinic [phone] and follow the prompts.  For any non-urgent questions, you may also contact your provider using MyChart. We now offer e-Visits for anyone 18 and older to request care online for non-urgent symptoms. For details visit [email or web address].   Also download the MyChart app! Go to the app store, search "MyChart", open the app, select [place], and log in with your MyChart username and password.  Masks are optional in the cancer centers. If you would like for your care team to wear a mask while they are taking care of you, please let them know. For doctor visits, patients may have with them one support person who is at least 69 years old. At this time, visitors are not allowed in the infusion area.  Bortezomib Injection What is this medication? BORTEZOMIB (bor TEZ oh mib) treats lymphoma. It may also be used to treat multiple myeloma, a type of bone marrow cancer. It works by blocking a protein that causes cancer cells to grow and multiply. This helps to slow or stop the spread of cancer cells. This medicine may   be used for other purposes; ask your health care provider or pharmacist if you have questions. COMMON BRAND NAME(S): Velcade What should I tell my care team before I take this medication? They need to know if you have any of these conditions: Dehydration Diabetes Heart disease Liver disease Tingling of the fingers or toes or other nerve  disorder An unusual or allergic reaction to bortezomib, other medications, foods, dyes, or preservatives If you or your partner are pregnant or trying to get pregnant Breastfeeding How should I use this medication? This medication is injected into a vein or under the skin. It is given by your care team in a hospital or clinic setting. Talk to your care team about the use of this medication in children. Special care may be needed. Overdosage: If you think you have taken too much of this medicine contact a poison control center or emergency room at once. NOTE: This medicine is only for you. Do not share this medicine with others. What if I miss a dose? Keep appointments for follow-up doses. It is important not to miss your dose. Call your care team if you are unable to keep an appointment. What may interact with this medication? Ketoconazole Rifampin This list may not describe all possible interactions. Give your health care provider a list of all the medicines, herbs, non-prescription drugs, or dietary supplements you use. Also tell them if you smoke, drink alcohol, or use illegal drugs. Some items may interact with your medicine. What should I watch for while using this medication? Your condition will be monitored carefully while you are receiving this medication. You may need blood work while taking this medication. This medication may affect your coordination, reaction time, or judgment. Do not drive or operate machinery until you know how this medication affects you. Sit up or stand slowly to reduce the risk of dizzy or fainting spells. Drinking alcohol with this medication can increase the risk of these side effects. This medication may increase your risk of getting an infection. Call your care team for advice if you get a fever, chills, sore throat, or other symptoms of a cold or flu. Do not treat yourself. Try to avoid being around people who are sick. Check with your care team if you have  severe diarrhea, nausea, and vomiting, or if you sweat a lot. The loss of too much body fluid may make it dangerous for you to take this medication. Talk to your care team if you may be pregnant. Serious birth defects can occur if you take this medication during pregnancy and for 7 months after the last dose. You will need a negative pregnancy test before starting this medication. Contraception is recommended while taking this medication and for 7 months after the last dose. Your care team can help you find the option that works for you. If your partner can get pregnant, use a condom during sex while taking this medication and for 4 months after the last dose. Do not breastfeed while taking this medication and for 2 months after the last dose. This medication may cause infertility. Talk to your care team if you are concerned about your fertility. What side effects may I notice from receiving this medication? Side effects that you should report to your care team as soon as possible: Allergic reactions--skin rash, itching, hives, swelling of the face, lips, tongue, or throat Bleeding--bloody or black, tar-like stools, vomiting blood or brown material that looks like coffee grounds, red or dark brown urine, small   red or purple spots on skin, unusual bruising or bleeding Bleeding in the brain--severe headache, stiff neck, confusion, dizziness, change in vision, numbness or weakness of the face, arm, or leg, trouble speaking, trouble walking, vomiting Bowel blockage--stomach cramping, unable to have a bowel movement or pass gas, loss of appetite, vomiting Heart failure--shortness of breath, swelling of the ankles, feet, or hands, sudden weight gain, unusual weakness or fatigue Infection--fever, chills, cough, sore throat, wounds that don't heal, pain or trouble when passing urine, general feeling of discomfort or being unwell Liver injury--right upper belly pain, loss of appetite, nausea, light-colored  stool, dark yellow or brown urine, yellowing skin or eyes, unusual weakness or fatigue Low blood pressure--dizziness, feeling faint or lightheaded, blurry vision Lung injury--shortness of breath or trouble breathing, cough, spitting up blood, chest pain, fever Pain, tingling, or numbness in the hands or feet Severe or prolonged diarrhea Stomach pain, bloody diarrhea, pale skin, unusual weakness or fatigue, decrease in the amount of urine, which may be signs of hemolytic uremic syndrome Sudden and severe headache, confusion, change in vision, seizures, which may be signs of posterior reversible encephalopathy syndrome (PRES) TTP--purple spots on the skin or inside the mouth, pale skin, yellowing skin or eyes, unusual weakness or fatigue, fever, fast or irregular heartbeat, confusion, change in vision, trouble speaking, trouble walking Tumor lysis syndrome (TLS)--nausea, vomiting, diarrhea, decrease in the amount of urine, dark urine, unusual weakness or fatigue, confusion, muscle pain or cramps, fast or irregular heartbeat, joint pain Side effects that usually do not require medical attention (report to your care team if they continue or are bothersome): Constipation Diarrhea Fatigue Loss of appetite Nausea This list may not describe all possible side effects. Call your doctor for medical advice about side effects. You may report side effects to FDA at 1-800-FDA-1088. Where should I keep my medication? This medication is given in a hospital or clinic. It will not be stored at home. NOTE: This sheet is a summary. It may not cover all possible information. If you have questions about this medicine, talk to your doctor, pharmacist, or health care provider.  2023 Elsevier/Gold Standard (2021-08-17 00:00:00)   

## 2021-12-09 ENCOUNTER — Ambulatory Visit: Payer: Medicare Other | Attending: Cardiovascular Disease | Admitting: Cardiovascular Disease

## 2021-12-09 ENCOUNTER — Encounter: Payer: Self-pay | Admitting: Cardiovascular Disease

## 2021-12-09 VITALS — BP 134/60 | HR 89 | Ht 68.0 in | Wt 326.0 lb

## 2021-12-09 DIAGNOSIS — I5032 Chronic diastolic (congestive) heart failure: Secondary | ICD-10-CM | POA: Diagnosis not present

## 2021-12-09 MED ORDER — FUROSEMIDE 40 MG PO TABS: 40.0000 mg | ORAL_TABLET | Freq: Every day | ORAL | 3 refills | Status: DC

## 2021-12-09 NOTE — Progress Notes (Signed)
Chief Complaint  Patient presents with   New Patient (Initial Visit)   History of Present Illness: 71 yo male with history of multiple myeloma, prostate cancer, rheumatoid arthritis, depression, GERD, hyperlipidemia, HTN, chronic diastolic CHF, former tobacco abuse and sleep apnea who is here today as a new patient for the evaluation of lower extremity edema. He is being treated for multiple myeloma with chemotherapy. He has notice LE edema for the past few months. He recalls being on Lasix in the past but not over the past few months. Echo 11/15/21 with LVEF=60-65%. Normal RV function. Mild mitral regurgitation. He feels well overall other than the LE edema. He too  Primary Care Physician: Seward Carol, MD   Past Medical History:  Diagnosis Date   Arthritis    BPH (benign prostatic hyperplasia)    Cancer [facility])    Prostate: states he had a positive biopsy but had another one a year later and it was gone.    Depression    Dizziness    GERD (gastroesophageal reflux disease)    Gout    High cholesterol    Hypertension    Sleep apnea    has cpap - not currently wearing   Varicose veins     Past Surgical History:  Procedure Laterality Date   ANTERIOR CERVICAL DECOMP/DISCECTOMY FUSION N/A 02/21/2018   Procedure: Cervical Five-Six Cervical Six-Seven Anterior cervical decompression/discectomy/fusion;  Surgeon: Ashok Pall, MD;  Location: [place];  Service: Neurosurgery;  Laterality: N/A;  Cervical Five-Six Cervical Six-Seven Anterior cervical decompression/discectomy/fusion   BIOPSY  10/09/2019   Procedure: BIOPSY;  Surgeon: Ronnette Juniper, MD;  Location: [facility];  Service: Gastroenterology;;   BLADDER SURGERY     CARPAL TUNNEL RELEASE Right 02/11/2019   Procedure: Right Carpal Tunnel Wound Irrigation;  Surgeon: Ashok Pall, MD;  Location: [place];  Service: Neurosurgery;  Laterality: Right;  Right Carpal tunnel wound exploration/wash out   CARPAL TUNNEL RELEASE Bilateral     CHOLECYSTECTOMY  11/22/2012   CHOLECYSTECTOMY  11/22/2012   Procedure: LAPAROSCOPIC CHOLECYSTECTOMY;  Surgeon: Gwenyth Ober, MD;  Location: [place];  Service: General;;   COLONOSCOPY     COLONOSCOPY WITH PROPOFOL N/A 10/09/2019   Procedure: COLONOSCOPY WITH PROPOFOL;  Surgeon: Ronnette Juniper, MD;  Location: [facility];  Service: Gastroenterology;  Laterality: N/A;   ESOPHAGOGASTRODUODENOSCOPY (EGD) WITH PROPOFOL N/A 10/09/2019   Procedure: ESOPHAGOGASTRODUODENOSCOPY (EGD) WITH PROPOFOL;  Surgeon: Ronnette Juniper, MD;  Location: [facility];  Service: Gastroenterology;  Laterality: N/A;   POLYPECTOMY  10/09/2019   Procedure: POLYPECTOMY;  Surgeon: Ronnette Juniper, MD;  Location: [facility];  Service: Gastroenterology;;    Current Outpatient Medications  Medication Sig Dispense Refill   EQ ASPIRIN ADULT LOW DOSE 81 MG tablet Take 81 mg by mouth daily.     ferrous sulfate 325 (65 FE) MG EC tablet Take 325 mg by mouth daily with breakfast.     finasteride (PROSCAR) 5 MG tablet Take 5 mg by mouth daily.     folic acid (FOLVITE) 1 MG tablet Take 2 tablets (2 mg total) by mouth daily. 180 tablet 3   furosemide (LASIX) 40 MG tablet Take 1 tablet (40 mg total) by mouth daily. 90 tablet 3   hydrocortisone cream 0.5 % Apply 1 Application topically 3 (three) times daily. Apply small amount to affected area 3 times daily until healed 56 g 1   lenalidomide (REVLIMID) 25 MG capsule Take for 14 days on, 7 days off, repeat every 21 days. 14 capsule  0   montelukast (SINGULAIR) 10 MG tablet TAKE 1 TABLET BY MOUTH ON THE  DAY PRIOR TO DARATUMUMAB  TREATMENT AND 1 TABLET BY MOUTH  DAILY FOR 2 DAYS AFTER TREATMENT 30 tablet 4   mupirocin cream (BACTROBAN) 2 % Apply 1 Application topically 2 (two) times daily. 30 g 0   tamsulosin (FLOMAX) 0.4 MG CAPS capsule Take 0.4 mg by mouth daily after supper.     vitamin B-12 (CYANOCOBALAMIN) 100 MCG tablet Take 100 mcg by mouth daily.     Adalimumab (HUMIRA PEN) 40 MG/0.4ML PNKT Inject  40 mg into the skin every 14 (fourteen) days. (Patient not taking: Reported on 12/09/2021) 2 each 2   gabapentin (NEURONTIN) 100 MG capsule Take 1 capsule (100 mg total) by mouth at bedtime. (Patient not taking: Reported on 12/09/2021) 30 capsule 0   methotrexate (RHEUMATREX) 2.5 MG tablet TAKE 8 TABLETS BY MOUTH ONCE  WEEKLY . CAUTION: CHEMOTHERAPY.  PROTECTION FROM LIGHT (Patient not taking: Reported on 12/09/2021) 96 tablet 0   No current facility-administered medications for this visit.   Facility-Administered Medications Ordered in Other Visits  Medication Dose Route Frequency Provider Last Rate Last Admin   acetaminophen (TYLENOL) 325 MG tablet            dexamethasone (DECADRON) 4 MG tablet            diphenhydrAMINE (BENADRYL) 25 mg capsule             Allergies  Allergen Reactions   Atorvastatin     Other reaction(s): Unknown   Simvastatin     Arms ache Other reaction(s): Unknown   Other     Can only tolerate oxygen on a low level   Statins     Arms ache    Social History   Socioeconomic History   Marital status: Married    Spouse name: Not on file   Number of children: 2   Years of education: Not on file   Highest education level: Not on file  Occupational History   Occupation: Retired-mechanic  Tobacco Use   Smoking status: Former    Packs/day: 1.00    Years: 31.00    Total pack years: 31.00    Types: Cigarettes    Quit date: 04/12/1998    Years since quitting: 23.6    Passive exposure: Never   Smokeless tobacco: Never  Vaping Use   Vaping Use: Never used  Substance and Sexual Activity   Alcohol use: No   Drug use: No   Sexual activity: Not on file  Other Topics Concern   Not on file  Social History Narrative   Not on file   Social Determinants of Health   Financial Resource Strain: Not on file  Food Insecurity: Not on file  Transportation Needs: Not on file  Physical Activity: Not on file  Stress: Not on file  Social Connections: Not on file   Intimate Partner Violence: Not on file    Family History  Problem Relation Age of Onset   Hypertension Mother    Diabetes Mother    Multiple myeloma Mother    Hypertension Father    Diabetes Father    Healthy Son    Healthy Daughter     Review of Systems:  As stated in the HPI and otherwise negative.   BP 134/60   Pulse 89   Ht '5\' 8"'  (1.727 m)   Wt (!) 326 lb (147.9 kg)   SpO2 98%   BMI 49.57  kg/m   Physical Examination: General: Well developed, well nourished, NAD  HEENT: OP clear, mucus membranes moist  SKIN: warm, dry. No rashes. Neuro: No focal deficits  Musculoskeletal: Muscle strength 5/5 all ext  Psychiatric: Mood and affect normal  Neck: No JVD, no carotid bruits, no thyromegaly, no lymphadenopathy.  Lungs:Clear bilaterally, no wheezes, rhonci, crackles Cardiovascular: Regular rate and rhythm. No murmurs, gallops or rubs. Abdomen:Soft. Bowel sounds present. Non-tender.  Extremities: Trace to 1+ bilateral lower extremity edema.   EKG:  EKG is ordered today. The ekg ordered today demonstrates NSR  Echo 11/15/21:  1. Left ventricular ejection fraction, by estimation, is 60 to 65%. The  left ventricle has normal function. The left ventricle has no regional  wall motion abnormalities. Left ventricular diastolic parameters are  consistent with Grade II diastolic  dysfunction (pseudonormalization). The average left ventricular global  longitudinal strain is -17.4 %.   2. Right ventricular systolic function is normal. The right ventricular  size is normal. There is mildly elevated pulmonary artery systolic  pressure. The estimated right ventricular systolic pressure is 89.3 mmHg.   3. The mitral valve is normal in structure. Mild mitral valve  regurgitation. No evidence of mitral stenosis.   4. The aortic valve was not well visualized. Aortic valve regurgitation  is not visualized. No aortic stenosis is present.   5. The inferior vena cava is normal in size with  greater than 50%  respiratory variability, suggesting right atrial pressure of 3 mmHg.   Recent Labs: 12/07/2021: ALT 10; BUN 14; Creatinine, Ser 1.01; Hemoglobin 12.0; Platelets 175; Potassium 3.7; Sodium 139   Lipid Panel    Component Value Date/Time   CHOL 204 (H) 11/14/2012 1208   TRIG 107 11/14/2012 1208   HDL 56 11/14/2012 1208   CHOLHDL 3.6 11/14/2012 1208   VLDL 21 11/14/2012 1208   LDLCALC 127 (H) 11/14/2012 1208     Wt Readings from Last 3 Encounters:  12/09/21 (!) 326 lb (147.9 kg)  12/07/21 (!) 324 lb 11.8 oz (147.3 kg)  11/30/21 (!) 319 lb 11.2 oz (145 kg)    Assessment and Plan:   1. Chronic diastolic CHF: His LE edema is likely due to diastolic CHF. Normal LV systolic function on recent echo. Will start Lasix 40 mg daily. He gets weekly BMETs at the cancer center.   2. Mitral regurgitation: Mild by echo in August 2023. Repeat echo in three years.   Labs/ tests ordered today include:   Orders Placed This Encounter  Procedures   EKG 12-Lead     Disposition:   F/U with me in 6 months.   Signed, Lauree Chandler, MD, [facility] 12/09/2021 12:10 PM    [facility] [place], [place], [place]  [address] Phone: [phone]; Fax: [phone]

## 2021-12-09 NOTE — Patient Instructions (Signed)
Medication Instructions:  Please start Furosemide 40 mg once every morning. Continue all other medications as listed.  *If you need a refill on your cardiac medications before your next appointment, please call your pharmacy*  Follow-Up: At [facility], you and your health needs are our priority.  As part of our continuing mission to provide you with exceptional heart care, we have created designated Provider Care Teams.  These Care Teams include your primary Cardiologist (physician) and Advanced Practice Providers (APPs -  Physician Assistants and Nurse Practitioners) who all work together to provide you with the care you need, when you need it.  We recommend signing up for the patient portal called "MyChart".  Sign up information is provided on this After Visit Summary.  MyChart is used to connect with patients for Virtual Visits (Telemedicine).  Patients are able to view lab/test results, encounter notes, upcoming appointments, etc.  Non-urgent messages can be sent to your provider as well.   To learn more about what you can do with MyChart, go to [email or web address].    Your next appointment:   6 month(s)  The format for your next appointment:   In Person  Provider:   Lauree Chandler, MD      Important Information About Sugar

## 2021-12-14 ENCOUNTER — Inpatient Hospital Stay: Payer: Medicare Other

## 2021-12-14 ENCOUNTER — Other Ambulatory Visit: Payer: Self-pay | Admitting: Oncology

## 2021-12-14 ENCOUNTER — Ambulatory Visit: Payer: Medicare Other | Admitting: Oncology

## 2021-12-14 VITALS — BP 130/60 | HR 70 | Temp 97.6°F | Resp 18 | Wt 324.5 lb

## 2021-12-14 DIAGNOSIS — G620 Drug-induced polyneuropathy: Secondary | ICD-10-CM | POA: Diagnosis not present

## 2021-12-14 DIAGNOSIS — Z7961 Long term (current) use of immunomodulator: Secondary | ICD-10-CM | POA: Diagnosis not present

## 2021-12-14 DIAGNOSIS — Z7982 Long term (current) use of aspirin: Secondary | ICD-10-CM | POA: Diagnosis not present

## 2021-12-14 DIAGNOSIS — Z7952 Long term (current) use of systemic steroids: Secondary | ICD-10-CM | POA: Diagnosis not present

## 2021-12-14 DIAGNOSIS — C9000 Multiple myeloma not having achieved remission: Secondary | ICD-10-CM

## 2021-12-14 DIAGNOSIS — D6481 Anemia due to antineoplastic chemotherapy: Secondary | ICD-10-CM | POA: Diagnosis not present

## 2021-12-14 DIAGNOSIS — Z87891 Personal history of nicotine dependence: Secondary | ICD-10-CM | POA: Diagnosis not present

## 2021-12-14 DIAGNOSIS — T451X5A Adverse effect of antineoplastic and immunosuppressive drugs, initial encounter: Secondary | ICD-10-CM | POA: Diagnosis not present

## 2021-12-14 DIAGNOSIS — Z5112 Encounter for antineoplastic immunotherapy: Secondary | ICD-10-CM | POA: Diagnosis not present

## 2021-12-14 DIAGNOSIS — Z79899 Other long term (current) drug therapy: Secondary | ICD-10-CM | POA: Diagnosis not present

## 2021-12-14 LAB — CBC WITH DIFFERENTIAL/PLATELET
Abs Immature Granulocytes: 0.03 10*3/uL (ref 0.00–0.07)
Basophils Absolute: 0.1 10*3/uL (ref 0.0–0.1)
Basophils Relative: 1 %
Eosinophils Absolute: 0.8 10*3/uL — ABNORMAL HIGH (ref 0.0–0.5)
Eosinophils Relative: 10 %
HCT: 35.1 % — ABNORMAL LOW (ref 39.0–52.0)
Hemoglobin: 11.2 g/dL — ABNORMAL LOW (ref 13.0–17.0)
Immature Granulocytes: 0 %
Lymphocytes Relative: 11 %
Lymphs Abs: 0.8 10*3/uL (ref 0.7–4.0)
MCH: 29.9 pg (ref 26.0–34.0)
MCHC: 31.9 g/dL (ref 30.0–36.0)
MCV: 93.9 fL (ref 80.0–100.0)
Monocytes Absolute: 1.1 10*3/uL — ABNORMAL HIGH (ref 0.1–1.0)
Monocytes Relative: 14 %
Neutro Abs: 4.9 10*3/uL (ref 1.7–7.7)
Neutrophils Relative %: 64 %
Platelets: 203 10*3/uL (ref 150–400)
RBC: 3.74 MIL/uL — ABNORMAL LOW (ref 4.22–5.81)
RDW: 15.6 % — ABNORMAL HIGH (ref 11.5–15.5)
WBC: 7.6 10*3/uL (ref 4.0–10.5)
nRBC: 0 % (ref 0.0–0.2)

## 2021-12-14 LAB — COMPREHENSIVE METABOLIC PANEL
ALT: 9 U/L (ref 0–44)
AST: 22 U/L (ref 15–41)
Albumin: 3.7 g/dL (ref 3.5–5.0)
Alkaline Phosphatase: 68 U/L (ref 38–126)
Anion gap: 5 (ref 5–15)
BUN: 10 mg/dL (ref 8–23)
CO2: 24 mmol/L (ref 22–32)
Calcium: 8.6 mg/dL — ABNORMAL LOW (ref 8.9–10.3)
Chloride: 109 mmol/L (ref 98–111)
Creatinine, Ser: 1 mg/dL (ref 0.61–1.24)
GFR, Estimated: >60 mL/min (ref >60)
Glucose, Bld: 94 mg/dL (ref 70–99)
Potassium: 3.8 mmol/L (ref 3.5–5.1)
Sodium: 138 mmol/L (ref 135–145)
Total Bilirubin: 0.4 mg/dL (ref 0.3–1.2)
Total Protein: 6.3 g/dL — ABNORMAL LOW (ref 6.5–8.1)

## 2021-12-14 MED ORDER — DEXAMETHASONE 4 MG PO TABS
20.0000 mg | ORAL_TABLET | Freq: Once | ORAL | Status: AC
  Administered 2021-12-14: 20 mg via ORAL
  Filled 2021-12-14: qty 5

## 2021-12-14 MED ORDER — BORTEZOMIB CHEMO SQ INJECTION 3.5 MG (2.5MG/ML)
1.3000 mg/m2 | Freq: Once | INTRAMUSCULAR | Status: AC
  Administered 2021-12-14: 3.5 mg via SUBCUTANEOUS
  Filled 2021-12-14: qty 1.4

## 2021-12-15 ENCOUNTER — Other Ambulatory Visit: Payer: Self-pay

## 2021-12-15 MED ORDER — LENALIDOMIDE 25 MG PO CAPS: ORAL_CAPSULE | ORAL | 0 refills | Status: DC

## 2021-12-16 MED ORDER — LENALIDOMIDE 25 MG PO CAPS: ORAL_CAPSULE | ORAL | 0 refills | Status: DC

## 2021-12-16 NOTE — Addendum Note (Signed)
Addended by: Vanice Sarah on: 12/16/2021 09:38 AM   Modules accepted: Orders

## 2021-12-16 NOTE — Progress Notes (Signed)
resending Revlimid rx to include specific directions.

## 2021-12-19 ENCOUNTER — Other Ambulatory Visit: Payer: Self-pay | Admitting: Rheumatology

## 2021-12-20 DIAGNOSIS — Z807 Family history of other malignant neoplasms of lymphoid, hematopoietic and related tissues: Secondary | ICD-10-CM | POA: Diagnosis not present

## 2021-12-20 DIAGNOSIS — C9000 Multiple myeloma not having achieved remission: Secondary | ICD-10-CM | POA: Diagnosis not present

## 2021-12-20 DIAGNOSIS — Z79620 Long term (current) use of immunosuppressive biologic: Secondary | ICD-10-CM | POA: Diagnosis not present

## 2021-12-20 NOTE — Telephone Encounter (Signed)
Next Visit: 01/18/2022   Last Visit: 08/16/2021   Last Fill: 03/14/2021  DX: Rheumatoid arthritis with rheumatoid factor of multiple sites without organ or systems involvement   Current Dose per office note 7/91/5056: folic acid 2 mg by mouth daily.    Okay to refill Folic Acid?

## 2021-12-21 ENCOUNTER — Telehealth: Payer: Self-pay | Admitting: *Deleted

## 2021-12-21 ENCOUNTER — Encounter: Payer: Self-pay | Admitting: Oncology

## 2021-12-21 ENCOUNTER — Inpatient Hospital Stay: Payer: Medicare Other

## 2021-12-21 ENCOUNTER — Other Ambulatory Visit: Payer: Self-pay | Admitting: Oncology

## 2021-12-21 ENCOUNTER — Other Ambulatory Visit: Payer: Self-pay

## 2021-12-21 ENCOUNTER — Inpatient Hospital Stay (HOSPITAL_BASED_OUTPATIENT_CLINIC_OR_DEPARTMENT_OTHER): Payer: Medicare Other | Admitting: Oncology

## 2021-12-21 VITALS — BP 127/62 | HR 60 | Temp 97.9°F | Ht 68.0 in | Wt 323.6 lb

## 2021-12-21 VITALS — BP 118/62 | HR 62 | Temp 97.1°F

## 2021-12-21 DIAGNOSIS — Z5111 Encounter for antineoplastic chemotherapy: Secondary | ICD-10-CM

## 2021-12-21 DIAGNOSIS — Z79899 Other long term (current) drug therapy: Secondary | ICD-10-CM | POA: Diagnosis not present

## 2021-12-21 DIAGNOSIS — Z6841 Body Mass Index (BMI) 40.0 and over, adult: Secondary | ICD-10-CM

## 2021-12-21 DIAGNOSIS — C9000 Multiple myeloma not having achieved remission: Secondary | ICD-10-CM

## 2021-12-21 DIAGNOSIS — Z87891 Personal history of nicotine dependence: Secondary | ICD-10-CM | POA: Diagnosis not present

## 2021-12-21 DIAGNOSIS — Z7952 Long term (current) use of systemic steroids: Secondary | ICD-10-CM | POA: Diagnosis not present

## 2021-12-21 DIAGNOSIS — Z5112 Encounter for antineoplastic immunotherapy: Secondary | ICD-10-CM | POA: Diagnosis not present

## 2021-12-21 DIAGNOSIS — G629 Polyneuropathy, unspecified: Secondary | ICD-10-CM

## 2021-12-21 DIAGNOSIS — D649 Anemia, unspecified: Secondary | ICD-10-CM | POA: Diagnosis not present

## 2021-12-21 DIAGNOSIS — G620 Drug-induced polyneuropathy: Secondary | ICD-10-CM | POA: Diagnosis not present

## 2021-12-21 DIAGNOSIS — T451X5A Adverse effect of antineoplastic and immunosuppressive drugs, initial encounter: Secondary | ICD-10-CM | POA: Diagnosis not present

## 2021-12-21 DIAGNOSIS — D6481 Anemia due to antineoplastic chemotherapy: Secondary | ICD-10-CM | POA: Diagnosis not present

## 2021-12-21 DIAGNOSIS — Z7982 Long term (current) use of aspirin: Secondary | ICD-10-CM | POA: Diagnosis not present

## 2021-12-21 DIAGNOSIS — Z7961 Long term (current) use of immunomodulator: Secondary | ICD-10-CM | POA: Diagnosis not present

## 2021-12-21 LAB — CBC WITH DIFFERENTIAL/PLATELET
Abs Immature Granulocytes: 0.02 10*3/uL (ref 0.00–0.07)
Basophils Absolute: 0.1 10*3/uL (ref 0.0–0.1)
Basophils Relative: 1 %
Eosinophils Absolute: 0.4 10*3/uL (ref 0.0–0.5)
Eosinophils Relative: 5 %
HCT: 37.3 % — ABNORMAL LOW (ref 39.0–52.0)
Hemoglobin: 12.1 g/dL — ABNORMAL LOW (ref 13.0–17.0)
Immature Granulocytes: 0 %
Lymphocytes Relative: 21 %
Lymphs Abs: 1.6 10*3/uL (ref 0.7–4.0)
MCH: 29.9 pg (ref 26.0–34.0)
MCHC: 32.4 g/dL (ref 30.0–36.0)
MCV: 92.1 fL (ref 80.0–100.0)
Monocytes Absolute: 1.1 10*3/uL — ABNORMAL HIGH (ref 0.1–1.0)
Monocytes Relative: 14 %
Neutro Abs: 4.6 10*3/uL (ref 1.7–7.7)
Neutrophils Relative %: 59 %
Platelets: 220 10*3/uL (ref 150–400)
RBC: 4.05 MIL/uL — ABNORMAL LOW (ref 4.22–5.81)
RDW: 16 % — ABNORMAL HIGH (ref 11.5–15.5)
WBC: 7.7 10*3/uL (ref 4.0–10.5)
nRBC: 0 % (ref 0.0–0.2)

## 2021-12-21 LAB — COMPREHENSIVE METABOLIC PANEL
ALT: 8 U/L (ref 0–44)
AST: 15 U/L (ref 15–41)
Albumin: 3.9 g/dL (ref 3.5–5.0)
Alkaline Phosphatase: 72 U/L (ref 38–126)
Anion gap: 4 — ABNORMAL LOW (ref 5–15)
BUN: 15 mg/dL (ref 8–23)
CO2: 25 mmol/L (ref 22–32)
Calcium: 9.1 mg/dL (ref 8.9–10.3)
Chloride: 109 mmol/L (ref 98–111)
Creatinine, Ser: 0.94 mg/dL (ref 0.61–1.24)
GFR, Estimated: >60 mL/min (ref >60)
Glucose, Bld: 94 mg/dL (ref 70–99)
Potassium: 4 mmol/L (ref 3.5–5.1)
Sodium: 138 mmol/L (ref 135–145)
Total Bilirubin: 0.3 mg/dL (ref 0.3–1.2)
Total Protein: 6.9 g/dL (ref 6.5–8.1)

## 2021-12-21 MED ORDER — DEXAMETHASONE 4 MG PO TABS
20.0000 mg | ORAL_TABLET | Freq: Once | ORAL | Status: AC
  Administered 2021-12-21: 20 mg via ORAL
  Filled 2021-12-21: qty 5

## 2021-12-21 MED ORDER — BORTEZOMIB CHEMO SQ INJECTION 3.5 MG (2.5MG/ML)
1.3000 mg/m2 | Freq: Once | INTRAMUSCULAR | Status: AC
  Administered 2021-12-21: 3.5 mg via SUBCUTANEOUS
  Filled 2021-12-21: qty 1.4

## 2021-12-21 MED ORDER — LENALIDOMIDE 10 MG PO CAPS: 10.0000 mg | ORAL_CAPSULE | Freq: Every day | ORAL | 0 refills | Status: DC

## 2021-12-21 MED ORDER — ACETAMINOPHEN 325 MG PO TABS
650.0000 mg | ORAL_TABLET | Freq: Once | ORAL | Status: AC
  Administered 2021-12-21: 650 mg via ORAL
  Filled 2021-12-21: qty 2

## 2021-12-21 MED ORDER — DARATUMUMAB-HYALURONIDASE-FIHJ 1800-30000 MG-UT/15ML ~~LOC~~ SOLN
1800.0000 mg | Freq: Once | SUBCUTANEOUS | Status: AC
  Administered 2021-12-21: 1800 mg via SUBCUTANEOUS
  Filled 2021-12-21: qty 15

## 2021-12-21 MED ORDER — DIPHENHYDRAMINE HCL 25 MG PO CAPS
50.0000 mg | ORAL_CAPSULE | Freq: Once | ORAL | Status: AC
  Administered 2021-12-21: 50 mg via ORAL
  Filled 2021-12-21: qty 2

## 2021-12-21 NOTE — Assessment & Plan Note (Addendum)
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 Proceed with cycle 6 day 1 Velcade Dara. Take Revlimid D1-14 He will proceed with weekly Velcade on D8 and D15 Singualair 68m 1 day prior to daratumumab treatments, 2 days after treatment.  Starting cycle 7, plan Dara Q4 weeks, Revlimid 169mD1-21  Awaiting dental clearance. [facility]bone [facility] transplant team for evaluation.

## 2021-12-21 NOTE — Assessment & Plan Note (Addendum)
secondary to carpal tunnel disease and chemotherapy Slightly worse.  Grade 1/2 Patient did not tolerate gabapentin.  Monitor symptoms-stable

## 2021-12-21 NOTE — Assessment & Plan Note (Signed)
secondary to chemotherapy, and chronic disease.  Stable.

## 2021-12-21 NOTE — Assessment & Plan Note (Signed)
Chemotherapy plan as listed above 

## 2021-12-21 NOTE — Patient Instructions (Addendum)
[facility] AT [place]  Discharge Instructions: Thank you for choosing [facility] to provide your oncology and hematology care.  If you have a lab appointment with the [facility], please go directly to the [facility] and check in at the registration area.  Wear comfortable clothing and clothing appropriate for easy access to any Portacath or PICC line.   We strive to give you quality time with your provider. You may need to reschedule your appointment if you arrive late (15 or more minutes).  Arriving late affects you and other patients whose appointments are after yours.  Also, if you miss three or more appointments without notifying the office, you may be dismissed from the clinic at the provider's discretion.      For prescription refill requests, have your pharmacy contact our office and allow 72 hours for refills to be completed.    Today you received the following chemotherapy and/or immunotherapy agents Darzalex Faspro and Velcade   To help prevent nausea and vomiting after your treatment, we encourage you to take your nausea medication as directed.  BELOW ARE SYMPTOMS THAT SHOULD BE REPORTED IMMEDIATELY: *FEVER GREATER THAN 100.4 F (38 C) OR HIGHER *CHILLS OR SWEATING *NAUSEA AND VOMITING THAT IS NOT CONTROLLED WITH YOUR NAUSEA MEDICATION *UNUSUAL SHORTNESS OF BREATH *UNUSUAL BRUISING OR BLEEDING *URINARY PROBLEMS (pain or burning when urinating, or frequent urination) *BOWEL PROBLEMS (unusual diarrhea, constipation, pain near the anus) TENDERNESS IN MOUTH AND THROAT WITH OR WITHOUT PRESENCE OF ULCERS (sore throat, sores in mouth, or a toothache) UNUSUAL RASH, SWELLING OR PAIN  UNUSUAL VAGINAL DISCHARGE OR ITCHING   Items with * indicate a potential emergency and should be followed up as soon as possible or go to the Emergency Department if any problems should occur.  Please show the CHEMOTHERAPY ALERT CARD or IMMUNOTHERAPY ALERT CARD  at check-in to the Emergency Department and triage nurse.  Should you have questions after your visit or need to cancel or reschedule your appointment, please contact [facility] [place] [facility] [place]  [phone] and follow the prompts.  Office hours are 8:00 a.m. to 4:30 p.m. Monday - Friday. Please note that voicemails left after 4:00 p.m. may not be returned until the following business day.  We are closed weekends and major holidays. You have access to a nurse at all times for urgent questions. Please call the main number to the clinic [phone] and follow the prompts.  For any non-urgent questions, you may also contact your provider using MyChart. We now offer e-Visits for anyone 49 and older to request care online for non-urgent symptoms. For details visit [email or web address].   Also download the MyChart app! Go to the app store, search "MyChart", open the app, select [facility], and log in with your MyChart username and password.  Masks are optional in the cancer centers. If you would like for your care team to wear a mask while they are taking care of you, please let them know. For doctor visits, patients may have with them one support person who is at least 69 years old. At this time, visitors are not allowed in the infusion area.

## 2021-12-21 NOTE — Assessment & Plan Note (Signed)
Recommend life style modification, healthy diet and exercise if tolerating.

## 2021-12-21 NOTE — Progress Notes (Signed)
Hematology/Oncology Progress note [phone] [phone]      Patient Care Team: Eric Carol, MD as PCP - General (Internal Medicine) Eric Blanks, MD as PCP - Cardiology (Cardiology)  ASSESSMENT & PLAN:   Cancer Staging  Multiple myeloma not having achieved remission [facility]) Staging form: Plasma Cell Myeloma and Plasma Cell Disorders, AJCC 8th Edition - Clinical stage from 04/22/2020: RISS Stage II (Beta-2-microglobulin (mg/L): 2.7, Albumin (Eric/dL): 3.1, ISS: Stage II, High-risk cytogenetics: Absent, LDH: Normal) - Signed by Eric Server, MD on 08/10/2021   Multiple myeloma not having achieved remission ([place]) IgA lambda multiple myeloma, 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 Proceed with cycle 6 day 1 Velcade Dara. Take Revlimid D1-14 He will proceed with weekly Velcade on D8 and D15 Singualair 109m 1 day prior to daratumumab treatments, 2 days after treatment.  Starting cycle 7, plan Dara Q4 weeks, Revlimid 117mD1-21  Awaiting dental clearance. [place] [facility] [place].  Encounter for antineoplastic chemotherapy Chemotherapy plan as listed above.  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  Stable.  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  Grade 1/2 Patient did not tolerate gabapentin.  Monitor symptoms-stable   Morbid obesity with BMI of 45.0-49.9, adult (HCC) Recommend life style modification, healthy diet and exercise if tolerating.    Orders Placed This Encounter  Procedures   Kappa/lambda light chains    Standing Status:   Future    Standing Expiration Date:   01/12/2023    Follow up  1 week lab Velcade 2 weeks lab Velcade 3 weeks flex lab MD Dara   All questions were answered. The patient knows to call the clinic with any problems, questions or concerns.  [facility] 12/21/2021    CHIEF COMPLAINTS/REASON FOR VISIT:  Follow-up for multiple myeloma  HISTORY OF PRESENTING ILLNESS:  Eric Cruz a 89.o. male presents for follow up of myeloma .  Oncology History  [place] 04/22[place] Initial Diagnosis   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  27% plasma cell, cytogenetics - normal, and myeloma FISH panel is negative.  Standard risk.  Congo red staining was added and was negative.  - 07/27/2021, bone marrow biopsy showed hypercellular marrow involved by plasma cell myeloma, CD138 immunohistochemistry plasma cells compromise approximately 70% of the cellular elements and are lambda restricted by light chain in situ hybridization.  Cytogenetics showed duplication of 1q.  Cytogenetics is normal.   04/22/2020 Cancer Staging   Staging form: Plasma Cell Myeloma and Plasma Cell Disorders, AJCC 8th Edition - Clinical stage from 04/22/2020: RISS Stage II (Beta-2-microglobulin (mg/L): 2.7, Albumin (Eric/dL): 3.1, ISS: Stage II, High-risk cytogenetics: Absent, LDH: Normal) - Signed by YuEarlie ServerMD on 08/10/2021 Stage prefix: Initial diagnosis Beta 2 microglobulin range (mg/L): Less than 3.5 Albumin range (Eric/dL): Less than 3.5 Cytogenetics: 1q addition Lactate dehydrogenase (LDH) (U/L): 136 Serum calcium level: Normal Serum creatinine level: Normal   08/12/2021 Imaging   PET scan showed 1. No evidence of active myeloma on skull base to thigh FDG PET scan. 2. No soft tissue plasmacytoma.3. No lytic or suspicious lesion on CT portion exam.     08/22/2021 -  Chemotherapy   Patient is on Treatment Plan : MYAtlanticDaratumumab  SQ) q21d x 6 Cycles (Induction/Consolidation)     08/24/2021 - 11/09/2021 Chemotherapy   Patient is on Treatment Plan : MYELOMA  NEWLY DIAGNOSED TRANSPLANT CANDIDATE DaraVRd (Daratumumab SQ) q21d x 6 Cycles (Induction/Consolidation)     11/02/2021 Imaging   Bilateral lower extremity US negative for DVT   11/15/2021 Echocardiogram   1. Left ventricular ejection fraction, by estimation, is 60 to 65%. The left ventricle has normal function. The left ventricle has no regional wall motion abnormalities. Left ventricular diastolic parameters are consistent with Grade II diastolic dysfunction (pseudonormalization). The average left ventricular global longitudinal strain is -17.4 %.   2. Right ventricular systolic function is normal. The right ventricular size is normal. There is mildly elevated pulmonary artery systolic pressure. The estimated right ventricular systolic pressure is 57.4 mmHg.   3. The mitral valve is normal in structure. Mild mitral valve regurgitation. No evidence of mitral stenosis.  4. The aortic valve was not well visualized. Aortic valve regurgitation  is not visualized. No aortic stenosis is present.   5. The inferior vena cava is normal in size with greater than 50% respiratory variability, suggesting right atrial pressure of 3 mmHg.     For rheumatoid arthritis, patient was seen by Dr. Estanislado Pandy recently and is currently off methotrexate and Humira.  + Bilateral lower extremity swelling.[place] negative for DVT  INTERVAL HISTORY Eric Cruz is a 71 y.o. male who has above history reviewed by me today presents for follow up visit for management of multiple myeloma He feels well today. Established with BM transplant team at [facility].  Does not tolerate gabapentin.  Neuropathy symptoms are stable.  No shortness of breath, fever, nausea vomiting.      Review of Systems  Constitutional:  Positive for fatigue. Negative for appetite change, chills, fever and unexpected weight change.  HENT:   Negative for hearing loss and voice change.   Eyes:  Negative for eye problems and icterus.  Respiratory:   Negative for chest tightness, cough and shortness of breath.   Cardiovascular:  Negative for chest pain and leg swelling.  Gastrointestinal:  Negative for abdominal distention and abdominal pain.  Endocrine: Negative for hot flashes.  Genitourinary:  Negative for difficulty urinating, dysuria and frequency.   Musculoskeletal:  Positive for arthralgias and back pain.  Skin:  Negative for itching and rash.  Neurological:  Negative for light-headedness and numbness.  Hematological:  Negative for adenopathy. Does not bruise/bleed easily.  Psychiatric/Behavioral:  Negative for confusion.      MEDICAL HISTORY:  Past Medical History:  Diagnosis Date   Arthritis    BPH (benign prostatic hyperplasia)    Cancer (HCC)    Prostate: states he had a positive biopsy but had another one a year later and it was gone.    Depression    Dizziness    GERD (gastroesophageal reflux disease)    Gout    High cholesterol    Hypertension    Sleep apnea    has cpap - not currently wearing   Varicose veins     SURGICAL HISTORY: Past Surgical History:  Procedure Laterality Date   ANTERIOR CERVICAL DECOMP/DISCECTOMY FUSION N/A 02/21/2018   Procedure: Cervical Five-Six Cervical Six-Seven Anterior cervical decompression/discectomy/fusion;  Surgeon: Ashok Pall, MD;  Location: [place];  Service: Neurosurgery;  Laterality: N/A;  Cervical Five-Six Cervical Six-Seven Anterior cervical decompression/discectomy/fusion   BIOPSY  10/09/2019   Procedure: BIOPSY;  Surgeon: Ronnette Juniper, MD;  Location: [facility];  Service: Gastroenterology;;  BLADDER SURGERY     CARPAL TUNNEL RELEASE Right 02/11/2019   Procedure: Right Carpal Tunnel Wound Irrigation;  Surgeon: Ashok Pall, MD;  Location: [place];  Service: Neurosurgery;  Laterality: Right;  Right Carpal tunnel wound exploration/wash out   CARPAL TUNNEL RELEASE Bilateral    CHOLECYSTECTOMY  11/22/2012   CHOLECYSTECTOMY  11/22/2012   Procedure: LAPAROSCOPIC  CHOLECYSTECTOMY;  Surgeon: Gwenyth Ober, MD;  Location: [place];  Service: General;;   COLONOSCOPY     COLONOSCOPY WITH PROPOFOL N/A 10/09/2019   Procedure: COLONOSCOPY WITH PROPOFOL;  Surgeon: Ronnette Juniper, MD;  Location: [facility];  Service: Gastroenterology;  Laterality: N/A;   ESOPHAGOGASTRODUODENOSCOPY (EGD) WITH PROPOFOL N/A 10/09/2019   Procedure: ESOPHAGOGASTRODUODENOSCOPY (EGD) WITH PROPOFOL;  Surgeon: Ronnette Juniper, MD;  Location: [facility];  Service: Gastroenterology;  Laterality: N/A;   POLYPECTOMY  10/09/2019   Procedure: POLYPECTOMY;  Surgeon: Ronnette Juniper, MD;  Location: [facility];  Service: Gastroenterology;;    SOCIAL HISTORY: Social History   Socioeconomic History   Marital status: Married    Spouse name: Not on file   Number of children: 2   Years of education: Not on file   Highest education level: Not on file  Occupational History   Occupation: Retired-mechanic  Tobacco Use   Smoking status: Former    Packs/day: 1.00    Years: 31.00    Total pack years: 31.00    Types: Cigarettes    Quit date: 04/12/1998    Years since quitting: 23.7    Passive exposure: Never   Smokeless tobacco: Never  Vaping Use   Vaping Use: Never used  Substance and Sexual Activity   Alcohol use: No   Drug use: No   Sexual activity: Not on file  Other Topics Concern   Not on file  Social History Narrative   Not on file   Social Determinants of Health   Financial Resource Strain: Not on file  Food Insecurity: Not on file  Transportation Needs: Not on file  Physical Activity: Not on file  Stress: Not on file  Social Connections: Not on file  Intimate Partner Violence: Not on file    FAMILY HISTORY: Family History  Problem Relation Age of Onset   Hypertension Mother    Diabetes Mother    Multiple myeloma Mother    Hypertension Father    Diabetes Father    Healthy Son    Healthy Daughter     ALLERGIES:  is allergic to atorvastatin, simvastatin, other, and  statins.  MEDICATIONS:  Current Outpatient Medications  Medication Sig Dispense Refill   EQ ASPIRIN ADULT LOW DOSE 81 MG tablet Take 81 mg by mouth daily.     ferrous sulfate 325 (65 FE) MG EC tablet Take 325 mg by mouth daily with breakfast.     finasteride (PROSCAR) 5 MG tablet Take 5 mg by mouth daily.     folic acid (FOLVITE) 1 MG tablet TAKE 2 TABLETS BY MOUTH DAILY 180 tablet 3   furosemide (LASIX) 40 MG tablet Take 1 tablet (40 mg total) by mouth daily. 90 tablet 3   furosemide (LASIX) 40 MG tablet Take 1 tablet by mouth daily.     hydrocortisone cream 0.5 % Apply 1 Application topically 3 (three) times daily. Apply small amount to affected area 3 times daily until healed 56 Eric 1   lenalidomide (REVLIMID) 25 MG capsule Take 1 capsule (25 mg total) by mouth daily. Take for 14 days on, 7 days off, repeat every 21  days 14 capsule 0   montelukast (SINGULAIR) 10 MG tablet TAKE 1 TABLET BY MOUTH ON THE  DAY PRIOR TO DARATUMUMAB  TREATMENT AND 1 TABLET BY MOUTH  DAILY FOR 2 DAYS AFTER TREATMENT 30 tablet 4   mupirocin cream (BACTROBAN) 2 % Apply 1 Application topically 2 (two) times daily. 30 Eric 0   tamsulosin (FLOMAX) 0.4 MG CAPS capsule Take 0.4 mg by mouth daily after supper.     Thiamine HCl (VITAMIN B-1) 250 MG tablet Take 1 tablet by mouth daily.     vitamin B-12 (CYANOCOBALAMIN) 100 MCG tablet Take 100 mcg by mouth daily.     Adalimumab (HUMIRA PEN) 40 MG/0.4ML PNKT Inject 40 mg into the skin every 14 (fourteen) days. (Patient not taking: Reported on 12/09/2021) 2 each 2   methotrexate (RHEUMATREX) 2.5 MG tablet TAKE 8 TABLETS BY MOUTH ONCE  WEEKLY . CAUTION: CHEMOTHERAPY.  PROTECTION FROM LIGHT (Patient not taking: Reported on 12/21/2021) 96 tablet 0   No current facility-administered medications for this visit.   Facility-Administered Medications Ordered in Other Visits  Medication Dose Route Frequency Provider Last Rate Last Admin   acetaminophen (TYLENOL) 325 MG tablet             dexamethasone (DECADRON) 4 MG tablet            diphenhydrAMINE (BENADRYL) 25 mg capsule              PHYSICAL EXAMINATION: ECOG PERFORMANCE STATUS: 1 - Symptomatic but completely ambulatory Vitals:   12/21/21 0850  BP: 127/62  Pulse: 60  Temp: 97.9 F (36.6 C)  SpO2: 99%   Filed Weights   12/21/21 0850  Weight: (!) 323 lb 9.6 oz (146.8 kg)    Physical Exam Constitutional:      General: He is not in acute distress.    Appearance: He is obese.     Comments: Patient walks with a cane.  HENT:     Head: Normocephalic and atraumatic.  Eyes:     General: No scleral icterus. Cardiovascular:     Rate and Rhythm: Normal rate and regular rhythm.     Heart sounds: Normal heart sounds.  Pulmonary:     Effort: Pulmonary effort is normal. No respiratory distress.     Breath sounds: No wheezing.  Abdominal:     General: Bowel sounds are normal. There is no distension.     Palpations: Abdomen is soft.  Musculoskeletal:        General: No deformity. Normal range of motion.     Cervical back: Normal range of motion and neck supple.     Comments: Trace edema, bilateral low extremities.   Skin:    General: Skin is warm and dry.     Findings: No erythema or rash.  Neurological:     Mental Status: He is alert and oriented to person, place, and time. Mental status is at baseline.     Cranial Nerves: No cranial nerve deficit.     Coordination: Coordination normal.  Psychiatric:        Mood and Affect: Mood normal.      LABORATORY DATA:  I have reviewed the data as listed Lab Results  Component Value Date   WBC 7.7 12/21/2021   HGB 12.1 (L) 12/21/2021   HCT 37.3 (L) 12/21/2021   MCV 92.1 12/21/2021   PLT 220 12/21/2021   Recent Labs    12/07/21 0800 12/14/21 0808 12/21/21 0845  NA 139 138 138  K 3.7  3.8 4.0  CL 108 109 109  CO2 '24 24 25  ' GLUCOSE 109* 94 94  BUN '14 10 15  ' CREATININE 1.01 1.00 0.94  CALCIUM 8.8* 8.6* 9.1  GFRNONAA >60 >60 >60  PROT 6.5 6.3* 6.9   ALBUMIN 3.7 3.7 3.9  AST '17 22 15  ' ALT '10 9 8  ' ALKPHOS 74 68 72  BILITOT 0.3 0.4 0.3    Iron/TIBC/Ferritin/ %Sat    Component Value Date/Time   IRON 78 04/05/2021 1008   TIBC 274 04/05/2021 1008   FERRITIN 425 (H) 04/05/2021 1008   IRONPCTSAT 28 04/05/2021 1008      RADIOGRAPHIC STUDIES: I have personally reviewed the radiological images as listed and agreed with the findings in the report.  ECHOCARDIOGRAM COMPLETE  Result Date: 11/15/2021    ECHOCARDIOGRAM REPORT   Patient Name:   RAYMIR FROMMELT Date of Exam: 11/15/2021 Medical Rec #:  [id number]         Height:       68.0 in Accession #:    [id number]        Weight:       326.0 lb Date of Birth:  Apr 02, 1951         BSA:          2.515 m Patient Age:    20 years          BP:           131/68 mmHg Patient Gender: M                 HR:           63 bpm. Exam Location:  [facility] Procedure: 2D Echo, Color Doppler, Cardiac Doppler and Strain Analysis Indications:     Z09 Chemo                  M79.89 Leg swelling  History:         Patient has prior history of Echocardiogram examinations, most                  recent 06/07/2017. Prostate cancer, Multiple myeloma; Risk                  Factors:Former Smoker, Sleep Apnea, Hypertension and                  Dyslipidemia.  Sonographer:     Rosalia Hammers Referring Phys:  [id number] Scenic Cartier Washko Diagnosing Phys: Ida Rogue MD  Sonographer Comments: Image acquisition challenging due to patient body habitus and Image acquisition challenging due to respiratory motion. Global longitudinal strain was attempted. IMPRESSIONS  1. Left ventricular ejection fraction, by estimation, is 60 to 65%. The left ventricle has normal function. The left ventricle has no regional wall motion abnormalities. Left ventricular diastolic parameters are consistent with Grade II diastolic dysfunction (pseudonormalization). The average left ventricular global longitudinal strain is -17.4 %.  2. Right ventricular systolic function is normal.  The right ventricular size is normal. There is mildly elevated pulmonary artery systolic pressure. The estimated right ventricular systolic pressure is 67.1 mmHg.  3. The mitral valve is normal in structure. Mild mitral valve regurgitation. No evidence of mitral stenosis.  4. The aortic valve was not well visualized. Aortic valve regurgitation is not visualized. No aortic stenosis is present.  5. The inferior vena cava is normal in size with greater than 50% respiratory variability, suggesting right atrial pressure of 3 mmHg. FINDINGS  Left Ventricle: Left ventricular ejection  fraction, by estimation, is 60 to 65%. The left ventricle has normal function. The left ventricle has no regional wall motion abnormalities. The average left ventricular global longitudinal strain is -17.4 %. The left ventricular internal cavity size was normal in size. There is no left ventricular hypertrophy. Left ventricular diastolic parameters are consistent with Grade II diastolic dysfunction (pseudonormalization). Right Ventricle: The right ventricular size is normal. No increase in right ventricular wall thickness. Right ventricular systolic function is normal. There is mildly elevated pulmonary artery systolic pressure. The tricuspid regurgitant velocity is 2.86  m/s, and with an assumed right atrial pressure of 5 mmHg, the estimated right ventricular systolic pressure is 42.3 mmHg. Left Atrium: Left atrial size was normal in size. Right Atrium: Right atrial size was normal in size. Pericardium: There is no evidence of pericardial effusion. Mitral Valve: The mitral valve is normal in structure. Mild mitral valve regurgitation. No evidence of mitral valve stenosis. Tricuspid Valve: The tricuspid valve is normal in structure. Tricuspid valve regurgitation is mild . No evidence of tricuspid stenosis. Aortic Valve: The aortic valve was not well visualized. Aortic valve regurgitation is not visualized. No aortic stenosis is present. Aortic  valve mean gradient measures 4.0 mmHg. Aortic valve peak gradient measures 7.3 mmHg. Aortic valve area, by VTI measures 2.11 cm. Pulmonic Valve: The pulmonic valve was normal in structure. Pulmonic valve regurgitation is not visualized. No evidence of pulmonic stenosis. Aorta: The aortic root is normal in size and structure. Venous: The inferior vena cava is normal in size with greater than 50% respiratory variability, suggesting right atrial pressure of 3 mmHg. IAS/Shunts: No atrial level shunt detected by color flow Doppler.  LEFT VENTRICLE PLAX 2D LVIDd:         4.68 cm   Diastology LVIDs:         3.15 cm   LV e' medial:    10.70 cm/s LV PW:         1.29 cm   LV E/e' medial:  9.9 LV IVS:        1.31 cm   LV e' lateral:   8.92 cm/s LVOT diam:     1.80 cm   LV E/e' lateral: 11.9 LV SV:         66 LV SV Index:   26        2D Longitudinal Strain LVOT Area:     2.54 cm  2D Strain GLS Avg:     -17.4 %                           3D Volume EF:                          3D EF:        62 %                          LV EDV:       239 ml                          LV ESV:       90 ml                          LV SV:        149 ml RIGHT VENTRICLE RV Basal  diam:  3.12 cm RV Mid diam:    4.14 cm RV S prime:     12.90 cm/s TAPSE (M-mode): 2.5 cm LEFT ATRIUM             Index        RIGHT ATRIUM           Index LA diam:        3.70 cm 1.47 cm/m   RA Area:     14.40 cm LA Vol (A2C):   58.4 ml 23.22 ml/m  RA Volume:   32.00 ml  12.72 ml/m LA Vol (A4C):   68.1 ml 27.07 ml/m LA Biplane Vol: 63.4 ml 25.20 ml/m  AORTIC VALVE AV Area (Vmax):    2.07 cm AV Area (Vmean):   1.89 cm AV Area (VTI):     2.11 cm AV Vmax:           135.00 cm/s AV Vmean:          94.400 cm/s AV VTI:            0.311 m AV Peak Grad:      7.3 mmHg AV Mean Grad:      4.0 mmHg LVOT Vmax:         110.00 cm/s LVOT Vmean:        70.000 cm/s LVOT VTI:          0.258 m LVOT/AV VTI ratio: 0.83  AORTA Ao Root diam: 2.90 cm MITRAL VALVE                TRICUSPID VALVE  MV Area (PHT): 3.13 cm     TR Peak grad:   32.7 mmHg MV Decel Time: 242 msec     TR Vmax:        286.00 cm/s MV E velocity: 106.00 cm/s MV A velocity: 90.00 cm/s   SHUNTS MV E/A ratio:  1.18         Systemic VTI:  0.26 m                             Systemic Diam: 1.80 cm Ida Rogue MD Electronically signed by Ida Rogue MD Signature Date/Time: 11/15/2021/12:51:33 PM    Final    US Venous Img Lower Bilateral  Result Date: 11/02/2021 CLINICAL DATA:  71 year old male with history of leg swelling. EXAM: BILATERAL LOWER EXTREMITY VENOUS DOPPLER ULTRASOUND TECHNIQUE: Gray-scale sonography with graded compression, as well as color Doppler and duplex ultrasound were performed to evaluate the lower extremity deep venous systems from the level of the common femoral vein and including the common femoral, femoral, profunda femoral, popliteal and calf veins including the posterior tibial, peroneal and gastrocnemius veins when visible. The superficial great saphenous vein was also interrogated. Spectral Doppler was utilized to evaluate flow at rest and with distal augmentation maneuvers in the common femoral, femoral and popliteal veins. COMPARISON:  None Available. FINDINGS: RIGHT LOWER EXTREMITY Common Femoral Vein: No evidence of thrombus. Normal compressibility, respiratory phasicity and response to augmentation. Saphenofemoral Junction: No evidence of thrombus. Normal compressibility and flow on color Doppler imaging. Profunda Femoral Vein: No evidence of thrombus. Normal compressibility and flow on color Doppler imaging. Femoral Vein: No evidence of thrombus. Normal compressibility, respiratory phasicity and response to augmentation. Popliteal Vein: No evidence of thrombus. Normal compressibility, respiratory phasicity and response to augmentation. Calf Veins: No evidence of thrombus. Normal compressibility and flow on color Doppler imaging. Other Findings:  None. LEFT LOWER EXTREMITY Common Femoral Vein: No  evidence of thrombus. Normal compressibility, respiratory phasicity and response to augmentation. Saphenofemoral Junction: No evidence of thrombus. Normal compressibility and flow on color Doppler imaging. Profunda Femoral Vein: No evidence of thrombus. Normal compressibility and flow on color Doppler imaging. Femoral Vein: No evidence of thrombus. Normal compressibility, respiratory phasicity and response to augmentation. Popliteal Vein: No evidence of thrombus. Normal compressibility, respiratory phasicity and response to augmentation. Calf Veins: No evidence of thrombus. Normal compressibility and flow on color Doppler imaging. Other Findings:  None. IMPRESSION: No evidence of bilateral lower extremity deep venous thrombosis. Ruthann Cancer, MD [facility] Radiology Electronically Signed   By: Ruthann Cancer M.D.   On: 11/02/2021 14:23

## 2021-12-21 NOTE — Telephone Encounter (Signed)
Dr Drue Second cell number if you have questions regarding Transplant. [phone]

## 2021-12-22 ENCOUNTER — Encounter: Payer: Self-pay | Admitting: Oncology

## 2021-12-22 LAB — KAPPA/LAMBDA LIGHT CHAINS
Kappa free light chain: 12.9 mg/L (ref 3.3–19.4)
Kappa, lambda light chain ratio: 1.7 — ABNORMAL HIGH (ref 0.26–1.65)
Lambda free light chains: 7.6 mg/L (ref 5.7–26.3)

## 2021-12-26 LAB — MULTIPLE MYELOMA PANEL, SERUM
Albumin SerPl Elph-Mcnc: 3.6 g/dL (ref 2.9–4.4)
Albumin/Glob SerPl: 1.5 (ref 0.7–1.7)
Alpha 1: 0.2 g/dL (ref 0.0–0.4)
Alpha2 Glob SerPl Elph-Mcnc: 0.7 g/dL (ref 0.4–1.0)
B-Globulin SerPl Elph-Mcnc: 1.1 g/dL (ref 0.7–1.3)
Gamma Glob SerPl Elph-Mcnc: 0.5 g/dL (ref 0.4–1.8)
Globulin, Total: 2.5 g/dL (ref 2.2–3.9)
IgA: 81 mg/dL (ref 61–437)
IgG (Immunoglobin G), Serum: 707 mg/dL (ref 603–1613)
IgM (Immunoglobulin M), Srm: 52 mg/dL (ref 20–172)
M Protein SerPl Elph-Mcnc: 0.2 g/dL — ABNORMAL HIGH
Total Protein ELP: 6.1 g/dL (ref 6.0–8.5)

## 2021-12-28 ENCOUNTER — Other Ambulatory Visit: Payer: Medicare Other

## 2021-12-28 ENCOUNTER — Telehealth: Payer: Self-pay | Admitting: Rheumatology

## 2021-12-28 ENCOUNTER — Inpatient Hospital Stay: Payer: Medicare Other

## 2021-12-28 ENCOUNTER — Encounter: Payer: Self-pay | Admitting: Oncology

## 2021-12-28 ENCOUNTER — Ambulatory Visit: Payer: Medicare Other | Admitting: Oncology

## 2021-12-28 ENCOUNTER — Ambulatory Visit: Payer: Medicare Other

## 2021-12-28 VITALS — BP 127/77 | HR 79 | Temp 98.1°F | Resp 18 | Wt 318.6 lb

## 2021-12-28 DIAGNOSIS — T451X5A Adverse effect of antineoplastic and immunosuppressive drugs, initial encounter: Secondary | ICD-10-CM | POA: Diagnosis not present

## 2021-12-28 DIAGNOSIS — C9000 Multiple myeloma not having achieved remission: Secondary | ICD-10-CM

## 2021-12-28 DIAGNOSIS — Z7982 Long term (current) use of aspirin: Secondary | ICD-10-CM | POA: Diagnosis not present

## 2021-12-28 DIAGNOSIS — Z5112 Encounter for antineoplastic immunotherapy: Secondary | ICD-10-CM | POA: Diagnosis not present

## 2021-12-28 DIAGNOSIS — Z7961 Long term (current) use of immunomodulator: Secondary | ICD-10-CM | POA: Diagnosis not present

## 2021-12-28 DIAGNOSIS — Z7952 Long term (current) use of systemic steroids: Secondary | ICD-10-CM | POA: Diagnosis not present

## 2021-12-28 DIAGNOSIS — G620 Drug-induced polyneuropathy: Secondary | ICD-10-CM | POA: Diagnosis not present

## 2021-12-28 DIAGNOSIS — Z79899 Other long term (current) drug therapy: Secondary | ICD-10-CM | POA: Diagnosis not present

## 2021-12-28 DIAGNOSIS — D6481 Anemia due to antineoplastic chemotherapy: Secondary | ICD-10-CM | POA: Diagnosis not present

## 2021-12-28 DIAGNOSIS — Z87891 Personal history of nicotine dependence: Secondary | ICD-10-CM | POA: Diagnosis not present

## 2021-12-28 LAB — COMPREHENSIVE METABOLIC PANEL
ALT: 8 U/L (ref 0–44)
AST: 16 U/L (ref 15–41)
Albumin: 3.9 g/dL (ref 3.5–5.0)
Alkaline Phosphatase: 72 U/L (ref 38–126)
Anion gap: 5 (ref 5–15)
BUN: 12 mg/dL (ref 8–23)
CO2: 25 mmol/L (ref 22–32)
Calcium: 8.9 mg/dL (ref 8.9–10.3)
Chloride: 108 mmol/L (ref 98–111)
Creatinine, Ser: 0.97 mg/dL (ref 0.61–1.24)
GFR, Estimated: >60 mL/min (ref >60)
Glucose, Bld: 94 mg/dL (ref 70–99)
Potassium: 3.9 mmol/L (ref 3.5–5.1)
Sodium: 138 mmol/L (ref 135–145)
Total Bilirubin: 0.5 mg/dL (ref 0.3–1.2)
Total Protein: 6.8 g/dL (ref 6.5–8.1)

## 2021-12-28 LAB — CBC WITH DIFFERENTIAL/PLATELET
Abs Immature Granulocytes: 0.04 10*3/uL (ref 0.00–0.07)
Basophils Absolute: 0.1 10*3/uL (ref 0.0–0.1)
Basophils Relative: 1 %
Eosinophils Absolute: 0.7 10*3/uL — ABNORMAL HIGH (ref 0.0–0.5)
Eosinophils Relative: 10 %
HCT: 35 % — ABNORMAL LOW (ref 39.0–52.0)
Hemoglobin: 11.5 g/dL — ABNORMAL LOW (ref 13.0–17.0)
Immature Granulocytes: 1 %
Lymphocytes Relative: 13 %
Lymphs Abs: 1 10*3/uL (ref 0.7–4.0)
MCH: 29.7 pg (ref 26.0–34.0)
MCHC: 32.9 g/dL (ref 30.0–36.0)
MCV: 90.4 fL (ref 80.0–100.0)
Monocytes Absolute: 0.4 10*3/uL (ref 0.1–1.0)
Monocytes Relative: 6 %
Neutro Abs: 5.4 10*3/uL (ref 1.7–7.7)
Neutrophils Relative %: 69 %
Platelets: 216 10*3/uL (ref 150–400)
RBC: 3.87 MIL/uL — ABNORMAL LOW (ref 4.22–5.81)
RDW: 16 % — ABNORMAL HIGH (ref 11.5–15.5)
WBC: 7.6 10*3/uL (ref 4.0–10.5)
nRBC: 0 % (ref 0.0–0.2)

## 2021-12-28 MED ORDER — BORTEZOMIB CHEMO SQ INJECTION 3.5 MG (2.5MG/ML)
1.3000 mg/m2 | Freq: Once | INTRAMUSCULAR | Status: AC
  Administered 2021-12-28: 3.5 mg via SUBCUTANEOUS
  Filled 2021-12-28: qty 1.4

## 2021-12-28 MED ORDER — PROCHLORPERAZINE MALEATE 10 MG PO TABS
10.0000 mg | ORAL_TABLET | Freq: Once | ORAL | Status: AC
  Administered 2021-12-28: 10 mg via ORAL
  Filled 2021-12-28: qty 1

## 2021-12-28 NOTE — Patient Instructions (Signed)
[facility]  Discharge Instructions: Thank you for choosing [facility] to provide your oncology and hematology care.  If you have a lab appointment with the Cancer Center, please go directly to the Cancer Center and check in at the registration area.  Wear comfortable clothing and clothing appropriate for easy access to any Portacath or PICC line.   We strive to give you quality time with your provider. You may need to reschedule your appointment if you arrive late (15 or more minutes).  Arriving late affects you and other patients whose appointments are after yours.  Also, if you miss three or more appointments without notifying the office, you may be dismissed from the clinic at the provider's discretion.      For prescription refill requests, have your pharmacy contact our office and allow 72 hours for refills to be completed.    Today you received the following chemotherapy and/or immunotherapy agents velcade      To help prevent nausea and vomiting after your treatment, we encourage you to take your nausea medication as directed.  BELOW ARE SYMPTOMS THAT SHOULD BE REPORTED IMMEDIATELY: *FEVER GREATER THAN 100.4 F (38 C) OR HIGHER *CHILLS OR SWEATING *NAUSEA AND VOMITING THAT IS NOT CONTROLLED WITH YOUR NAUSEA MEDICATION *UNUSUAL SHORTNESS OF BREATH *UNUSUAL BRUISING OR BLEEDING *URINARY PROBLEMS (pain or burning when urinating, or frequent urination) *BOWEL PROBLEMS (unusual diarrhea, constipation, pain near the anus) TENDERNESS IN MOUTH AND THROAT WITH OR WITHOUT PRESENCE OF ULCERS (sore throat, sores in mouth, or a toothache) UNUSUAL RASH, SWELLING OR PAIN  UNUSUAL VAGINAL DISCHARGE OR ITCHING   Items with * indicate a potential emergency and should be followed up as soon as possible or go to the Emergency Department if any problems should occur.  Please show the CHEMOTHERAPY ALERT CARD or IMMUNOTHERAPY ALERT CARD at check-in to the  Emergency Department and triage nurse.  Should you have questions after your visit or need to cancel or reschedule your appointment, please contact [facility]  [phone] and follow the prompts.  Office hours are 8:00 a.m. to 4:30 p.m. Monday - Friday. Please note that voicemails left after 4:00 p.m. may not be returned until the following business day.  We are closed weekends and major holidays. You have access to a nurse at all times for urgent questions. Please call the main number to the clinic [phone] and follow the prompts.  For any non-urgent questions, you may also contact your provider using MyChart. We now offer e-Visits for anyone 18 and older to request care online for non-urgent symptoms. For details visit [email or web address].   Also download the MyChart app! Go to the app store, search "MyChart", open the app, select , and log in with your MyChart username and password.  Masks are optional in the cancer centers. If you would like for your care team to wear a mask while they are taking care of you, please let them know. For doctor visits, patients may have with them one support person who is at least 69 years old. At this time, visitors are not allowed in the infusion area.   

## 2021-12-28 NOTE — Progress Notes (Signed)
Error

## 2021-12-28 NOTE — Telephone Encounter (Signed)
I returned patient's call.  I advised him to stop methotrexate as he will be getting stem cell transplant.  He can also discuss this further with Dr. Tasia Catchings.

## 2021-12-28 NOTE — Telephone Encounter (Signed)
Patient left a voicemail stating he has been undergoing chemotherapy and is starting stem cell treatment.  Patient wants to discuss the treatment plan with Dr. Estanislado Pandy and requested a return call.

## 2022-01-03 ENCOUNTER — Other Ambulatory Visit: Payer: Self-pay

## 2022-01-04 ENCOUNTER — Ambulatory Visit: Payer: Medicare Other | Admitting: Oncology

## 2022-01-04 ENCOUNTER — Inpatient Hospital Stay: Payer: Medicare Other | Attending: Oncology

## 2022-01-04 ENCOUNTER — Inpatient Hospital Stay: Payer: Medicare Other

## 2022-01-04 VITALS — BP 134/77 | HR 78 | Temp 97.8°F | Resp 17 | Wt 320.4 lb

## 2022-01-04 DIAGNOSIS — Z7961 Long term (current) use of immunomodulator: Secondary | ICD-10-CM | POA: Diagnosis not present

## 2022-01-04 DIAGNOSIS — Z5112 Encounter for antineoplastic immunotherapy: Secondary | ICD-10-CM | POA: Insufficient documentation

## 2022-01-04 DIAGNOSIS — Z7982 Long term (current) use of aspirin: Secondary | ICD-10-CM | POA: Diagnosis not present

## 2022-01-04 DIAGNOSIS — C9000 Multiple myeloma not having achieved remission: Secondary | ICD-10-CM | POA: Insufficient documentation

## 2022-01-04 DIAGNOSIS — Z79899 Other long term (current) drug therapy: Secondary | ICD-10-CM | POA: Insufficient documentation

## 2022-01-04 DIAGNOSIS — N400 Enlarged prostate without lower urinary tract symptoms: Secondary | ICD-10-CM | POA: Insufficient documentation

## 2022-01-04 DIAGNOSIS — I10 Essential (primary) hypertension: Secondary | ICD-10-CM | POA: Insufficient documentation

## 2022-01-04 DIAGNOSIS — M069 Rheumatoid arthritis, unspecified: Secondary | ICD-10-CM | POA: Insufficient documentation

## 2022-01-04 DIAGNOSIS — Z87891 Personal history of nicotine dependence: Secondary | ICD-10-CM | POA: Insufficient documentation

## 2022-01-04 DIAGNOSIS — T451X5A Adverse effect of antineoplastic and immunosuppressive drugs, initial encounter: Secondary | ICD-10-CM | POA: Insufficient documentation

## 2022-01-04 DIAGNOSIS — G620 Drug-induced polyneuropathy: Secondary | ICD-10-CM | POA: Diagnosis not present

## 2022-01-04 DIAGNOSIS — Z7952 Long term (current) use of systemic steroids: Secondary | ICD-10-CM | POA: Insufficient documentation

## 2022-01-04 DIAGNOSIS — D6481 Anemia due to antineoplastic chemotherapy: Secondary | ICD-10-CM | POA: Insufficient documentation

## 2022-01-04 LAB — CBC WITH DIFFERENTIAL/PLATELET
Abs Immature Granulocytes: 0.04 10*3/uL (ref 0.00–0.07)
Basophils Absolute: 0.1 10*3/uL (ref 0.0–0.1)
Basophils Relative: 1 %
Eosinophils Absolute: 0.7 10*3/uL — ABNORMAL HIGH (ref 0.0–0.5)
Eosinophils Relative: 8 %
HCT: 35.1 % — ABNORMAL LOW (ref 39.0–52.0)
Hemoglobin: 11.3 g/dL — ABNORMAL LOW (ref 13.0–17.0)
Immature Granulocytes: 1 %
Lymphocytes Relative: 11 %
Lymphs Abs: 1 10*3/uL (ref 0.7–4.0)
MCH: 29.1 pg (ref 26.0–34.0)
MCHC: 32.2 g/dL (ref 30.0–36.0)
MCV: 90.5 fL (ref 80.0–100.0)
Monocytes Absolute: 0.9 10*3/uL (ref 0.1–1.0)
Monocytes Relative: 11 %
Neutro Abs: 6 10*3/uL (ref 1.7–7.7)
Neutrophils Relative %: 68 %
Platelets: 230 10*3/uL (ref 150–400)
RBC: 3.88 MIL/uL — ABNORMAL LOW (ref 4.22–5.81)
RDW: 15.9 % — ABNORMAL HIGH (ref 11.5–15.5)
WBC: 8.7 10*3/uL (ref 4.0–10.5)
nRBC: 0 % (ref 0.0–0.2)

## 2022-01-04 LAB — COMPREHENSIVE METABOLIC PANEL
ALT: 7 U/L (ref 0–44)
AST: 16 U/L (ref 15–41)
Albumin: 3.6 g/dL (ref 3.5–5.0)
Alkaline Phosphatase: 69 U/L (ref 38–126)
Anion gap: 4 — ABNORMAL LOW (ref 5–15)
BUN: 13 mg/dL (ref 8–23)
CO2: 24 mmol/L (ref 22–32)
Calcium: 8.5 mg/dL — ABNORMAL LOW (ref 8.9–10.3)
Chloride: 109 mmol/L (ref 98–111)
Creatinine, Ser: 1.02 mg/dL (ref 0.61–1.24)
GFR, Estimated: >60 mL/min (ref >60)
Glucose, Bld: 92 mg/dL (ref 70–99)
Potassium: 3.8 mmol/L (ref 3.5–5.1)
Sodium: 137 mmol/L (ref 135–145)
Total Bilirubin: 0.3 mg/dL (ref 0.3–1.2)
Total Protein: 6.3 g/dL — ABNORMAL LOW (ref 6.5–8.1)

## 2022-01-04 MED ORDER — DIPHENHYDRAMINE HCL 25 MG PO CAPS: 50.0000 mg | ORAL_CAPSULE | Freq: Once | ORAL | Status: DC

## 2022-01-04 MED ORDER — PROCHLORPERAZINE MALEATE 10 MG PO TABS
10.0000 mg | ORAL_TABLET | Freq: Once | ORAL | Status: AC
  Administered 2022-01-04: 10 mg via ORAL
  Filled 2022-01-04: qty 1

## 2022-01-04 MED ORDER — ACETAMINOPHEN 325 MG PO TABS: 650.0000 mg | ORAL_TABLET | Freq: Once | ORAL | Status: DC

## 2022-01-04 MED ORDER — BORTEZOMIB CHEMO SQ INJECTION 3.5 MG (2.5MG/ML)
1.3000 mg/m2 | Freq: Once | INTRAMUSCULAR | Status: AC
  Administered 2022-01-04: 3.5 mg via SUBCUTANEOUS
  Filled 2022-01-04: qty 1.4

## 2022-01-04 MED ORDER — DEXAMETHASONE 4 MG PO TABS: 20.0000 mg | ORAL_TABLET | Freq: Once | ORAL | Status: DC

## 2022-01-04 NOTE — Progress Notes (Unsigned)
Office Visit Note  Patient: Eric Cruz             Date of Birth: 1951-01-15           MRN: [id number]             PCP: Seward Carol, MD Referring: Seward Carol, MD Visit Date: 01/18/2022 Occupation: '@GUAROCC' @  Subjective:  Stiffness in both knees  History of Present Illness: ATUL DELUCIA is a 71 y.o. male with history of seropositive rheumatoid arthritis.  Patient has been holding Humira and methotrexate since May 2023.  He has been under the care of Dr. Tasia Catchings for management of multiple myeloma.  Patient reports that he is scheduled for an updated PET scan on Friday.  He is being referred to the stem cell treatment center at [facility] to discuss proceeding with stem cell treatment in the future. He states that since his last office visit he has noticed about a 90% improvement in his joint pain, stiffness, and inflammation.   He continues to have difficulty ambulating prolonged distances due to the severity of pain in his knee joints.  He has been using a cane to assist with ambulation which has been difficult.  He experiences stiffness after sitting for prolonged periods of time especially in the middle of the night.  He would greatly benefit from using an upright walker for assistance with ambulation.  Activities of Daily Living:  Patient reports morning stiffness for 0 minutes.   Patient Denies nocturnal pain.  Difficulty dressing/grooming: Denies Difficulty climbing stairs: Reports Difficulty getting out of chair: Reports Difficulty using hands for taps, buttons, cutlery, and/or writing: Denies  Review of Systems  Constitutional:  Negative for fatigue.  HENT:  Negative for mouth sores and mouth dryness.   Eyes:  Negative for dryness.  Respiratory:  Negative for shortness of breath.   Cardiovascular:  Negative for chest pain and palpitations.  Gastrointestinal:  Positive for constipation and diarrhea. Negative for blood in stool.  Endocrine: Negative for increased  urination.  Genitourinary:  Negative for involuntary urination.  Musculoskeletal:  Positive for joint pain, gait problem and joint pain. Negative for joint swelling, myalgias, muscle weakness, morning stiffness, muscle tenderness and myalgias.  Skin:  Negative for color change, rash and sensitivity to sunlight.  Allergic/Immunologic: Negative for susceptible to infections.  Neurological:  Positive for numbness and parasthesias. Negative for dizziness and headaches.  Hematological:  Negative for swollen glands.  Psychiatric/Behavioral:  Positive for sleep disturbance. Negative for depressed mood. The patient is not nervous/anxious.     PMFS History:  Patient Active Problem List   Diagnosis Date Noted   Abnormal weight gain 01/11/2022   Constipation 01/11/2022   Epigastric pain 01/11/2022   Morbid obesity ([place]) 12/20/2021   Morbid obesity with BMI of 45.0-49.9, adult ([place]) 11/30/2021   Leg swelling 11/02/2021   Chemotherapy induced diarrhea 11/02/2021   Neuropathy 09/02/2021   Normocytic anemia 09/02/2021   Encounter for antineoplastic chemotherapy 08/24/2021   Chest pain 08/25/2020   Gastroesophageal reflux disease 08/25/2020   Goals of care, counseling/discussion 04/22/2020   Multiple myeloma not having achieved remission ([place]) 04/22/2020   Rheumatoid arthritis ([place]) 04/07/2020   High risk medication use 04/07/2020   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    Depression    Dizziness    GERD (gastroesophageal reflux disease)    Gout    High cholesterol     Hypertension    Sleep apnea    has cpap - not currently wearing   Varicose veins     Family History  Problem Relation Age of Onset   Hypertension Mother    Diabetes Mother    Multiple myeloma Mother    Hypertension Father    Diabetes Father    Healthy Son    Healthy Daughter    Past Surgical History:  Procedure Laterality Date   ANTERIOR CERVICAL DECOMP/DISCECTOMY FUSION N/A 02/21/2018   Procedure: Cervical Five-Six Cervical Six-Seven Anterior cervical decompression/discectomy/fusion;  Surgeon: Ashok Pall, MD;  Location: [facility];  Service: Neurosurgery;  Laterality: N/A;  Cervical Five-Six Cervical Six-Seven Anterior cervical decompression/discectomy/fusion   BIOPSY  10/09/2019   Procedure: BIOPSY;  Surgeon: Ronnette Juniper, MD;  Location: [facility];  Service: Gastroenterology;;   BLADDER SURGERY     CARPAL TUNNEL RELEASE Right 02/11/2019   Procedure: Right Carpal Tunnel Wound Irrigation;  Surgeon: Ashok Pall, MD;  Location: [facility];  Service: Neurosurgery;  Laterality: Right;  Right Carpal tunnel wound exploration/wash out   CARPAL TUNNEL RELEASE Bilateral    CHOLECYSTECTOMY  11/22/2012   CHOLECYSTECTOMY  11/22/2012   Procedure: LAPAROSCOPIC CHOLECYSTECTOMY;  Surgeon: Gwenyth Ober, MD;  Location: [facility];  Service: General;;   COLONOSCOPY     COLONOSCOPY WITH PROPOFOL N/A 10/09/2019   Procedure: COLONOSCOPY WITH PROPOFOL;  Surgeon: Ronnette Juniper, MD;  Location: [facility];  Service: Gastroenterology;  Laterality: N/A;   ESOPHAGOGASTRODUODENOSCOPY (EGD) WITH PROPOFOL N/A 10/09/2019   Procedure: ESOPHAGOGASTRODUODENOSCOPY (EGD) WITH PROPOFOL;  Surgeon: Ronnette Juniper, MD;  Location: [facility];  Service: Gastroenterology;  Laterality: N/A;   POLYPECTOMY  10/09/2019   Procedure: POLYPECTOMY;  Surgeon: Ronnette Juniper, MD;  Location: [facility];  Service: Gastroenterology;;   Social History   Social History Narrative   Not on file   Immunization History  Administered Date(s) Administered    PFIZER(Purple Top)SARS-COV-2 Vaccination 03/04/2020, 04/05/2020, 09/14/2020     Objective: Vital Signs: BP 120/80 (BP Location: Left Arm, Patient Position: Sitting, Cuff Size: Large)   Pulse 76   Resp 18   Ht '5\' 8"'  (1.727 m)   Wt (!) 316 lb 3.2 oz (143.4 kg)   BMI 48.08 kg/m    Physical Exam Vitals and nursing note reviewed.  Constitutional:      Appearance: He is well-developed.  HENT:     Head: Normocephalic and atraumatic.  Eyes:     Conjunctiva/sclera: Conjunctivae normal.     Pupils: Pupils are equal, round, and reactive to light.  Cardiovascular:     Rate and Rhythm: Normal rate and regular rhythm.     Heart sounds: Normal heart sounds.  Pulmonary:     Effort: Pulmonary effort is normal.     Breath sounds: Normal breath sounds.  Abdominal:     General: Bowel sounds are normal.     Palpations: Abdomen is soft.  Musculoskeletal:     Cervical back: Normal range of motion and neck supple.  Skin:    General: Skin is warm and dry.     Capillary Refill: Capillary refill takes less than 2 seconds.  Neurological:     Mental Status: He is alert and oriented to person, place, and time.  Psychiatric:        Behavior: Behavior normal.      Musculoskeletal Exam: Using cane to assist with ambulation.  Patient remained seated during the examination.  C-spine is limited range of motion with lateral rotation.  Shoulder joints, elbow joints, wrist joints, MCPs, PIPs, DIPs have good range of motion with no synovitis.  Complete fist formation bilaterally.  Hip joints difficult to assess in seated position.  Both knee joints have good range of motion with no warmth or effusion.  Ankle joints have good range of motion with no joint tenderness.  Pedal edema noted bilaterally.  CDAI Exam: CDAI Score: -- Patient Global: --; Provider Global: -- Swollen: --; Tender: -- Joint Exam 01/18/2022   No joint exam has been documented for this visit   There is currently no information  documented on the homunculus. Go to the Rheumatology activity and complete the homunculus joint exam.  Investigation: No additional findings.  Imaging: No results found.  Recent Labs: Lab Results  Component Value Date   WBC 7.8 01/11/2022   HGB 11.7 (L) 01/11/2022   PLT 217 01/11/2022   NA 137 01/11/2022   K 4.0 01/11/2022   CL 108 01/11/2022   CO2 23 01/11/2022   GLUCOSE 102 (H) 01/11/2022   BUN 15 01/11/2022   CREATININE 0.97 01/11/2022   BILITOT 0.3 01/11/2022   ALKPHOS 70 01/11/2022   AST 18 01/11/2022   ALT 7 01/11/2022   PROT 6.8 01/11/2022   ALBUMIN 3.6 01/11/2022   CALCIUM 8.8 (L) 01/11/2022   GFRAA 104 06/07/2020   QFTBGOLDPLUS NEGATIVE 03/14/2021    Speciality Comments: Start date :Humira April 2022, methotrexate January 2022  Procedures:  No procedures performed Allergies: Atorvastatin, Simvastatin, Other, and Statins   Assessment / Plan:     Visit Diagnoses: Rheumatoid arthritis with rheumatoid factor of multiple sites without organ or systems involvement (HCC) - Positive RF, positive anti-CCP, elevated sed rate, synovitis involving multiple joints: He has no joint tenderness or synovitis on examination today.  He has not had any signs or symptoms of a rheumatoid arthritis flare.  He is not currently taking any immunosuppressive agents approved for rheumatoid arthritis.  He has been off of Humira and methotrexate since May 2023 while undergoing treatment for multiple myeloma under the care of Dr. Tasia Catchings.  He has been undergoing chemotherapy and is scheduled for an upcoming PET scan this Friday.  He has been referred to [facility] bone marrow transplant team to discuss future transplantation.  He has noticed about a 90% improvement in his joint pain and inflammation since his last office visit with [place].  He continues to have some difficulty with ambulation due to the pain and stiffness in his knee joints.  He has difficulty using a cane and does not feel stable.  He  would benefit from using an upright walker.  A prescription was provided to the patient today.  We will provide any necessary documentation to help with insurance approval of an upright walker to assist him.   He was advised to notify us if he develops signs or symptoms of a flare.  He will follow-up in the office in 6 months or sooner if needed.  High risk medication use - Holding Humira and MTX-since May 2023 while under the care of Dr. Tasia Catchings for management of multiple myeloma.  Receiving chemotherapy.  Referred to [facility]  myeloma/bone marrow transplant team for bone marrow transplant in the future. CBC and CMP were drawn on 01/11/2022.   TB Gold negative on 03/14/2021.    Arthritis of both acromioclavicular joints: He has good ROM of both shoulder joints with no discomfort at this time.    Contracture of right elbow: Unchanged.  No tenderness or inflammation noted.   HNP (herniated nucleus pulposus) with myelopathy, cervical: Limited ROM with lateral rotation.    History of bilateral carpal tunnel release: Asymptomatic at this time.   Multiple myeloma not having achieved remission ([place]) - Followed by Dr. Tasia Catchings.  Patient has been holding Humira and methotrexate since May 2023 prior to initiating chemotherapy. Reviewed office visit note from 01/11/2022.  Scheduled for PET scan on Friday.  Advised to follow-up with [facility] myeloma/bone marrow transplant team for bone marrow transplant in the future.  Symptomatic cholelithiasis    Orders: No orders of the defined types were placed in this encounter.  No orders of the defined types were placed in this encounter.    Follow-Up Instructions: Return in about 6 months (around 07/20/2022) for Rheumatoid arthritis.   Ofilia Neas, PA-C  Note - This record has been created using Dragon software.  Chart creation errors have been sought, but may not always  have been located. Such creation errors do not reflect on  the standard of medical  care.

## 2022-01-04 NOTE — Patient Instructions (Signed)
[facility] [place]  Discharge Instructions: Thank you for choosing [facility] to provide your oncology and hematology care.  If you have a lab appointment with the Cancer Center, please go directly to the Cancer Center and check in at the registration area.  Wear comfortable clothing and clothing appropriate for easy access to any Portacath or PICC line.   We strive to give you quality time with your provider. You may need to reschedule your appointment if you arrive late (15 or more minutes).  Arriving late affects you and other patients whose appointments are after yours.  Also, if you miss three or more appointments without notifying the office, you may be dismissed from the clinic at the provider's discretion.      For prescription refill requests, have your pharmacy contact our office and allow 72 hours for refills to be completed.    Today you received the following chemotherapy and/or immunotherapy agents velcade      To help prevent nausea and vomiting after your treatment, we encourage you to take your nausea medication as directed.  BELOW ARE SYMPTOMS THAT SHOULD BE REPORTED IMMEDIATELY: *FEVER GREATER THAN 100.4 F (38 C) OR HIGHER *CHILLS OR SWEATING *NAUSEA AND VOMITING THAT IS NOT CONTROLLED WITH YOUR NAUSEA MEDICATION *UNUSUAL SHORTNESS OF BREATH *UNUSUAL BRUISING OR BLEEDING *URINARY PROBLEMS (pain or burning when urinating, or frequent urination) *BOWEL PROBLEMS (unusual diarrhea, constipation, pain near the anus) TENDERNESS IN MOUTH AND THROAT WITH OR WITHOUT PRESENCE OF ULCERS (sore throat, sores in mouth, or a toothache) UNUSUAL RASH, SWELLING OR PAIN  UNUSUAL VAGINAL DISCHARGE OR ITCHING   Items with * indicate a potential emergency and should be followed up as soon as possible or go to the Emergency Department if any problems should occur.  Please show the CHEMOTHERAPY ALERT CARD or IMMUNOTHERAPY ALERT CARD at check-in to the  Emergency Department and triage nurse.  Should you have questions after your visit or need to cancel or reschedule your appointment, please contact [facility]  [phone] and follow the prompts.  Office hours are 8:00 a.m. to 4:30 p.m. Monday - Friday. Please note that voicemails left after 4:00 p.m. may not be returned until the following business day.  We are closed weekends and major holidays. You have access to a nurse at all times for urgent questions. Please call the main number to the clinic [phone] and follow the prompts.  For any non-urgent questions, you may also contact your provider using MyChart. We now offer e-Visits for anyone 18 and older to request care online for non-urgent symptoms. For details visit [email or web address].   Also download the MyChart app! Go to the app store, search "MyChart", open the app, select [facility], and log in with your MyChart username and password.  Masks are optional in the cancer centers. If you would like for your care team to wear a mask while they are taking care of you, please let them know. For doctor visits, patients may have with them one support person who is at least 69 years old. At this time, visitors are not allowed in the infusion area.   

## 2022-01-07 ENCOUNTER — Other Ambulatory Visit: Payer: Self-pay

## 2022-01-09 ENCOUNTER — Other Ambulatory Visit: Payer: Self-pay

## 2022-01-10 ENCOUNTER — Other Ambulatory Visit: Payer: Self-pay

## 2022-01-11 ENCOUNTER — Encounter: Payer: Self-pay | Admitting: Oncology

## 2022-01-11 ENCOUNTER — Other Ambulatory Visit: Payer: Self-pay

## 2022-01-11 ENCOUNTER — Other Ambulatory Visit: Payer: Self-pay | Admitting: Pharmacist

## 2022-01-11 ENCOUNTER — Inpatient Hospital Stay (HOSPITAL_BASED_OUTPATIENT_CLINIC_OR_DEPARTMENT_OTHER): Payer: Medicare Other | Admitting: Oncology

## 2022-01-11 ENCOUNTER — Inpatient Hospital Stay: Payer: Medicare Other

## 2022-01-11 VITALS — BP 123/75 | HR 71 | Resp 18 | Ht 68.0 in | Wt 320.0 lb

## 2022-01-11 VITALS — BP 131/68 | HR 68 | Temp 97.0°F | Resp 18

## 2022-01-11 DIAGNOSIS — C9000 Multiple myeloma not having achieved remission: Secondary | ICD-10-CM

## 2022-01-11 DIAGNOSIS — M069 Rheumatoid arthritis, unspecified: Secondary | ICD-10-CM | POA: Diagnosis not present

## 2022-01-11 DIAGNOSIS — Z7982 Long term (current) use of aspirin: Secondary | ICD-10-CM | POA: Diagnosis not present

## 2022-01-11 DIAGNOSIS — Z5111 Encounter for antineoplastic chemotherapy: Secondary | ICD-10-CM

## 2022-01-11 DIAGNOSIS — G629 Polyneuropathy, unspecified: Secondary | ICD-10-CM

## 2022-01-11 DIAGNOSIS — Z7952 Long term (current) use of systemic steroids: Secondary | ICD-10-CM | POA: Diagnosis not present

## 2022-01-11 DIAGNOSIS — K5900 Constipation, unspecified: Secondary | ICD-10-CM | POA: Insufficient documentation

## 2022-01-11 DIAGNOSIS — R1013 Epigastric pain: Secondary | ICD-10-CM | POA: Insufficient documentation

## 2022-01-11 DIAGNOSIS — D6481 Anemia due to antineoplastic chemotherapy: Secondary | ICD-10-CM | POA: Diagnosis not present

## 2022-01-11 DIAGNOSIS — T451X5A Adverse effect of antineoplastic and immunosuppressive drugs, initial encounter: Secondary | ICD-10-CM | POA: Diagnosis not present

## 2022-01-11 DIAGNOSIS — D649 Anemia, unspecified: Secondary | ICD-10-CM | POA: Diagnosis not present

## 2022-01-11 DIAGNOSIS — Z888 Allergy status to other drugs, medicaments and biological substances status: Secondary | ICD-10-CM | POA: Diagnosis not present

## 2022-01-11 DIAGNOSIS — G620 Drug-induced polyneuropathy: Secondary | ICD-10-CM | POA: Diagnosis not present

## 2022-01-11 DIAGNOSIS — Z7961 Long term (current) use of immunomodulator: Secondary | ICD-10-CM | POA: Diagnosis not present

## 2022-01-11 DIAGNOSIS — Z01818 Encounter for other preprocedural examination: Secondary | ICD-10-CM | POA: Diagnosis not present

## 2022-01-11 DIAGNOSIS — K0530 Chronic periodontitis, unspecified: Secondary | ICD-10-CM | POA: Diagnosis not present

## 2022-01-11 DIAGNOSIS — K036 Deposits [accretions] on teeth: Secondary | ICD-10-CM | POA: Diagnosis not present

## 2022-01-11 DIAGNOSIS — K08409 Partial loss of teeth, unspecified cause, unspecified class: Secondary | ICD-10-CM | POA: Diagnosis not present

## 2022-01-11 DIAGNOSIS — K063 Horizontal alveolar bone loss: Secondary | ICD-10-CM | POA: Diagnosis not present

## 2022-01-11 DIAGNOSIS — Z79899 Other long term (current) drug therapy: Secondary | ICD-10-CM | POA: Diagnosis not present

## 2022-01-11 DIAGNOSIS — R635 Abnormal weight gain: Secondary | ICD-10-CM | POA: Insufficient documentation

## 2022-01-11 DIAGNOSIS — I10 Essential (primary) hypertension: Secondary | ICD-10-CM | POA: Diagnosis not present

## 2022-01-11 DIAGNOSIS — Z87891 Personal history of nicotine dependence: Secondary | ICD-10-CM | POA: Diagnosis not present

## 2022-01-11 DIAGNOSIS — Z5112 Encounter for antineoplastic immunotherapy: Secondary | ICD-10-CM | POA: Diagnosis not present

## 2022-01-11 LAB — CBC WITH DIFFERENTIAL/PLATELET
Abs Immature Granulocytes: 0.02 10*3/uL (ref 0.00–0.07)
Basophils Absolute: 0.1 10*3/uL (ref 0.0–0.1)
Basophils Relative: 1 %
Eosinophils Absolute: 0.5 10*3/uL (ref 0.0–0.5)
Eosinophils Relative: 6 %
HCT: 35.5 % — ABNORMAL LOW (ref 39.0–52.0)
Hemoglobin: 11.7 g/dL — ABNORMAL LOW (ref 13.0–17.0)
Immature Granulocytes: 0 %
Lymphocytes Relative: 20 %
Lymphs Abs: 1.6 10*3/uL (ref 0.7–4.0)
MCH: 29.8 pg (ref 26.0–34.0)
MCHC: 33 g/dL (ref 30.0–36.0)
MCV: 90.6 fL (ref 80.0–100.0)
Monocytes Absolute: 1.4 10*3/uL — ABNORMAL HIGH (ref 0.1–1.0)
Monocytes Relative: 17 %
Neutro Abs: 4.3 10*3/uL (ref 1.7–7.7)
Neutrophils Relative %: 56 %
Platelets: 217 10*3/uL (ref 150–400)
RBC: 3.92 MIL/uL — ABNORMAL LOW (ref 4.22–5.81)
RDW: 15.8 % — ABNORMAL HIGH (ref 11.5–15.5)
WBC: 7.8 10*3/uL (ref 4.0–10.5)
nRBC: 0 % (ref 0.0–0.2)

## 2022-01-11 LAB — COMPREHENSIVE METABOLIC PANEL
ALT: 7 U/L (ref 0–44)
AST: 18 U/L (ref 15–41)
Albumin: 3.6 g/dL (ref 3.5–5.0)
Alkaline Phosphatase: 70 U/L (ref 38–126)
Anion gap: 6 (ref 5–15)
BUN: 15 mg/dL (ref 8–23)
CO2: 23 mmol/L (ref 22–32)
Calcium: 8.8 mg/dL — ABNORMAL LOW (ref 8.9–10.3)
Chloride: 108 mmol/L (ref 98–111)
Creatinine, Ser: 0.97 mg/dL (ref 0.61–1.24)
GFR, Estimated: >60 mL/min (ref >60)
Glucose, Bld: 102 mg/dL — ABNORMAL HIGH (ref 70–99)
Potassium: 4 mmol/L (ref 3.5–5.1)
Sodium: 137 mmol/L (ref 135–145)
Total Bilirubin: 0.3 mg/dL (ref 0.3–1.2)
Total Protein: 6.8 g/dL (ref 6.5–8.1)

## 2022-01-11 MED ORDER — DEXAMETHASONE 4 MG PO TABS
20.0000 mg | ORAL_TABLET | Freq: Once | ORAL | Status: AC
  Administered 2022-01-11: 20 mg via ORAL
  Filled 2022-01-11: qty 5

## 2022-01-11 MED ORDER — LENALIDOMIDE 10 MG PO CAPS: 10.0000 mg | ORAL_CAPSULE | Freq: Every day | ORAL | 0 refills | Status: DC

## 2022-01-11 MED ORDER — DIPHENHYDRAMINE HCL 25 MG PO CAPS
50.0000 mg | ORAL_CAPSULE | Freq: Once | ORAL | Status: AC
  Administered 2022-01-11: 50 mg via ORAL
  Filled 2022-01-11: qty 2

## 2022-01-11 MED ORDER — DARATUMUMAB-HYALURONIDASE-FIHJ 1800-30000 MG-UT/15ML ~~LOC~~ SOLN
1800.0000 mg | Freq: Once | SUBCUTANEOUS | Status: AC
  Administered 2022-01-11: 1800 mg via SUBCUTANEOUS
  Filled 2022-01-11: qty 15

## 2022-01-11 MED ORDER — ACETAMINOPHEN 325 MG PO TABS
650.0000 mg | ORAL_TABLET | Freq: Once | ORAL | Status: AC
  Administered 2022-01-11: 650 mg via ORAL
  Filled 2022-01-11: qty 2

## 2022-01-11 NOTE — Assessment & Plan Note (Signed)
follow up with rheumatology.  Currently he is off Humira and methotrexate.  I agree with holding the treatment to avoid synergistic bone marrow suppression.  Symptom has been stable.

## 2022-01-11 NOTE — Assessment & Plan Note (Signed)
secondary to carpal tunnel disease and chemotherapy Slightly worse.  Grade 1/2 Patient did not tolerate gabapentin.  Monitor symptoms-stable

## 2022-01-11 NOTE — Assessment & Plan Note (Signed)
secondary to chemotherapy, and chronic disease.  Stable.

## 2022-01-11 NOTE — Assessment & Plan Note (Signed)
Chemotherapy plan as listed above 

## 2022-01-11 NOTE — Assessment & Plan Note (Addendum)
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 Proceed with cycle 7 day 1 Velcade Dara. Starting cycle 7, plan Dara Q4 weeks, Revlimid 4m D1-21  Awaiting dental clearance. [facility]

## 2022-01-11 NOTE — Patient Instructions (Signed)
[facility]  Discharge Instructions: Thank you for choosing [facility] to provide your oncology and hematology care.  If you have a lab appointment with the Cancer Center, please go directly to the Cancer Center and check in at the registration area.  Wear comfortable clothing and clothing appropriate for easy access to any Portacath or PICC line.   We strive to give you quality time with your provider. You may need to reschedule your appointment if you arrive late (15 or more minutes).  Arriving late affects you and other patients whose appointments are after yours.  Also, if you miss three or more appointments without notifying the office, you may be dismissed from the clinic at the provider's discretion.      For prescription refill requests, have your pharmacy contact our office and allow 72 hours for refills to be completed.    Today you received the following chemotherapy and/or immunotherapy agents: Darzalex Faspro      To help prevent nausea and vomiting after your treatment, we encourage you to take your nausea medication as directed.  BELOW ARE SYMPTOMS THAT SHOULD BE REPORTED IMMEDIATELY: *FEVER GREATER THAN 100.4 F (38 C) OR HIGHER *CHILLS OR SWEATING *NAUSEA AND VOMITING THAT IS NOT CONTROLLED WITH YOUR NAUSEA MEDICATION *UNUSUAL SHORTNESS OF BREATH *UNUSUAL BRUISING OR BLEEDING *URINARY PROBLEMS (pain or burning when urinating, or frequent urination) *BOWEL PROBLEMS (unusual diarrhea, constipation, pain near the anus) TENDERNESS IN MOUTH AND THROAT WITH OR WITHOUT PRESENCE OF ULCERS (sore throat, sores in mouth, or a toothache) UNUSUAL RASH, SWELLING OR PAIN  UNUSUAL VAGINAL DISCHARGE OR ITCHING   Items with * indicate a potential emergency and should be followed up as soon as possible or go to the Emergency Department if any problems should occur.  Please show the CHEMOTHERAPY ALERT CARD or IMMUNOTHERAPY ALERT CARD at  check-in to the Emergency Department and triage nurse.  Should you have questions after your visit or need to cancel or reschedule your appointment, please contact [facility]  [phone] and follow the prompts.  Office hours are 8:00 a.m. to 4:30 p.m. Monday - Friday. Please note that voicemails left after 4:00 p.m. may not be returned until the following business day.  We are closed weekends and major holidays. You have access to a nurse at all times for urgent questions. Please call the main number to the clinic [phone] and follow the prompts.  For any non-urgent questions, you may also contact your provider using MyChart. We now offer e-Visits for anyone 18 and older to request care online for non-urgent symptoms. For details visit [email or web address].   Also download the MyChart app! Go to the app store, search "MyChart", open the app, select [facility], and log in with your MyChart username and password.  Masks are optional in the cancer centers. If you would like for your care team to wear a mask while they are taking care of you, please let them know. For doctor visits, patients may have with them one support person who is at least 69 years old. At this time, visitors are not allowed in the infusion area.   

## 2022-01-11 NOTE — Progress Notes (Signed)
Hematology/Oncology Progress note [phone] [phone]      Patient Care Team: Seward Carol, MD as PCP - General (Internal Medicine) Burnell Blanks, MD as PCP - Cardiology (Cardiology)  ASSESSMENT & PLAN:   Cancer Staging  Multiple myeloma not having achieved remission [facility]) Staging form: Plasma Cell Myeloma and Plasma Cell Disorders, AJCC 8th Edition - Clinical stage from 04/22/2020: RISS Stage II (Beta-2-microglobulin (mg/L): 2.7, Albumin (g/dL): 3.1, ISS: Stage II, High-risk cytogenetics: Absent, LDH: Normal) - Signed by Earlie Server, MD on 08/10/2021   Multiple myeloma not having achieved remission ([place]) IgA lambda multiple myeloma, 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 Proceed with cycle 7 day 1 Velcade Dara. Starting cycle 7, plan Dara Q4 weeks, Revlimid 81m D1-21  Awaiting dental clearance. Follow up with [facility] for [facility]  Encounter for antineoplastic chemotherapy Chemotherapy plan as listed above.  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  Grade 1/2 Patient did not tolerate gabapentin.  Monitor symptoms-stable   [place]oid [place].  Currently he is off Humira and methotrexate.  I agree with holding the treatment to avoid synergistic bone marrow suppression.  Symptom has been stable.  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  Stable.   Orders Placed This Encounter  Procedures   Kappa/lambda light chains    Standing Status:   Future    Standing Expiration Date:   02/09/2023    Follow up  4 weeks lab MD Dara Revlimid   All questions were answered. The patient knows to call the clinic with any problems, questions or concerns.  ZEarlie Server MD, PhD [facility] 01/11/2022    CHIEF COMPLAINTS/REASON FOR VISIT:   Follow-up for multiple myeloma  HISTORY OF PRESENTING ILLNESS:  Eric HECKMANNis a 71y.o. male presents for follow up of myeloma .  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  27% plasma cell, cytogenetics - normal, and myeloma FISH panel is negative.  Standard risk.  [place] red staining was added and was negative.  - 07/27/2021, bone marrow biopsy showed hypercellular marrow involved by plasma cell myeloma, CD138 immunohistochemistry plasma cells compromise approximately 70% of the cellular elements and are lambda restricted by light chain in situ hybridization.  Cytogenetics showed duplication of 1q.  Cytogenetics is normal.   04/22/2020 Cancer Staging   Staging form: Plasma Cell Myeloma and Plasma Cell Disorders, AJCC 8th Edition - Clinical stage from 04/22/2020: RISS Stage II (Beta-2-microglobulin (mg/L): 2.7, Albumin (g/dL): 3.1, ISS: Stage II, High-risk cytogenetics: Absent, LDH: Normal) - Signed by YEarlie Server MD on 08/10/2021 Stage prefix: Initial diagnosis Beta 2 microglobulin range (mg/L): Less than 3.5 Albumin range (g/dL): Less than 3.5 Cytogenetics: 1q addition Lactate dehydrogenase (LDH) (U/L): 136 Serum calcium level: Normal Serum creatinine level: Normal   08/12/2021 Imaging   PET scan showed 1. No evidence of active myeloma on skull base to thigh FDG PET scan. 2. No soft tissue plasmacytoma.3. No lytic or suspicious lesion on CT portion exam.     08/22/2021 -  Chemotherapy   Patient is on Treatment Plan : MYELOMA NEWLY DIAGNOSED TRANSPLANT CANDIDATE DaraVRd (Daratumumab SQ) q21d x 6 Cycles (Induction/Consolidation)     08/24/2021 - 11/09/2021 Chemotherapy  Patient is on Treatment Plan : MYELOMA NEWLY DIAGNOSED TRANSPLANT CANDIDATE DaraVRd (Daratumumab SQ) q21d x 6 Cycles  (Induction/Consolidation)     11/02/2021 Imaging   Bilateral lower extremity US negative for DVT   11/15/2021 Echocardiogram   1. Left ventricular ejection fraction, by estimation, is 60 to 65%. The left ventricle has normal function. The left ventricle has no regional wall motion abnormalities. Left ventricular diastolic parameters are consistent with Grade II diastolic dysfunction (pseudonormalization). The average left ventricular global longitudinal strain is -17.4 %.   2. Right ventricular systolic function is normal. The right ventricular size is normal. There is mildly elevated pulmonary artery systolic pressure. The estimated right ventricular systolic pressure is 45.8 mmHg.   3. The mitral valve is normal in structure. Mild mitral valve regurgitation. No evidence of mitral stenosis.  4. The aortic valve was not well visualized. Aortic valve regurgitation  is not visualized. No aortic stenosis is present.   5. The inferior vena cava is normal in size with greater than 50% respiratory variability, suggesting right atrial pressure of 3 mmHg.     For rheumatoid arthritis, patient was seen by Dr. Estanislado Pandy recently and is currently off methotrexate and Humira.  + Bilateral lower extremity swelling.[place] negative for DVT  INTERVAL HISTORY Eric Cruz is a 71 y.o. male who has above history reviewed by me today presents for follow up visit for management of multiple myeloma He feels well today. Established with BM transplant team at [facility].  Does not tolerate gabapentin.  Neuropathy symptoms are stable.  No shortness of breath, fever, nausea vomiting.   No new complaints.     Review of Systems  Constitutional:  Positive for fatigue. Negative for appetite change, chills, fever and unexpected weight change.  HENT:   Negative for hearing loss and voice change.   Eyes:  Negative for eye problems and icterus.  Respiratory:  Negative for chest tightness, cough and shortness of breath.    Cardiovascular:  Negative for chest pain and leg swelling.  Gastrointestinal:  Negative for abdominal distention and abdominal pain.  Endocrine: Negative for hot flashes.  Genitourinary:  Negative for difficulty urinating, dysuria and frequency.   Musculoskeletal:  Positive for arthralgias and back pain.  Skin:  Negative for itching and rash.  Neurological:  Negative for light-headedness and numbness.  Hematological:  Negative for adenopathy. Does not bruise/bleed easily.  Psychiatric/Behavioral:  Negative for confusion.      MEDICAL HISTORY:  Past Medical History:  Diagnosis Date   Arthritis    BPH (benign prostatic hyperplasia)    Cancer (HCC)    Prostate: states he had a positive biopsy but had another one a year later and it was gone.    Depression    Dizziness    GERD (gastroesophageal reflux disease)    Gout    High cholesterol    Hypertension    Sleep apnea    has cpap - not currently wearing   Varicose veins     SURGICAL HISTORY: Past Surgical History:  Procedure Laterality Date   ANTERIOR CERVICAL DECOMP/DISCECTOMY FUSION N/A 02/21/2018   Procedure: Cervical Five-Six Cervical Six-Seven Anterior cervical decompression/discectomy/fusion;  Surgeon: Ashok Pall, MD;  Location: [facility];  Service: Neurosurgery;  Laterality: N/A;  Cervical Five-Six Cervical Six-Seven Anterior cervical decompression/discectomy/fusion   BIOPSY  10/09/2019   Procedure: BIOPSY;  Surgeon: Ronnette Juniper, MD;  Location: [facility];  Service: Gastroenterology;;   BLADDER SURGERY     CARPAL TUNNEL RELEASE Right 02/11/2019  Procedure: Right Carpal Tunnel Wound Irrigation;  Surgeon: Ashok Pall, MD;  Location: [place];  Service: Neurosurgery;  Laterality: Right;  Right Carpal tunnel wound exploration/wash out   CARPAL TUNNEL RELEASE Bilateral    CHOLECYSTECTOMY  11/22/2012   CHOLECYSTECTOMY  11/22/2012   Procedure: LAPAROSCOPIC CHOLECYSTECTOMY;  Surgeon: Gwenyth Ober, MD;  Location: [facility];   Service: General;;   COLONOSCOPY     COLONOSCOPY WITH PROPOFOL N/A 10/09/2019   Procedure: COLONOSCOPY WITH PROPOFOL;  Surgeon: Ronnette Juniper, MD;  Location: [facility];  Service: Gastroenterology;  Laterality: N/A;   ESOPHAGOGASTRODUODENOSCOPY (EGD) WITH PROPOFOL N/A 10/09/2019   Procedure: ESOPHAGOGASTRODUODENOSCOPY (EGD) WITH PROPOFOL;  Surgeon: Ronnette Juniper, MD;  Location: [facility];  Service: Gastroenterology;  Laterality: N/A;   POLYPECTOMY  10/09/2019   Procedure: POLYPECTOMY;  Surgeon: Ronnette Juniper, MD;  Location: [facility];  Service: Gastroenterology;;    SOCIAL HISTORY: Social History   Socioeconomic History   Marital status: Married    Spouse name: Not on file   Number of children: 2   Years of education: Not on file   Highest education level: Not on file  Occupational History   Occupation: Retired-mechanic  Tobacco Use   Smoking status: Former    Packs/day: 1.00    Years: 31.00    Total pack years: 31.00    Types: Cigarettes    Quit date: 04/12/1998    Years since quitting: 23.7    Passive exposure: Never   Smokeless tobacco: Never  Vaping Use   Vaping Use: Never used  Substance and Sexual Activity   Alcohol use: No   Drug use: No   Sexual activity: Not on file  Other Topics Concern   Not on file  Social History Narrative   Not on file   Social Determinants of Health   Financial Resource Strain: Not on file  Food Insecurity: Not on file  Transportation Needs: Not on file  Physical Activity: Not on file  Stress: Not on file  Social Connections: Not on file  Intimate Partner Violence: Not on file    FAMILY HISTORY: Family History  Problem Relation Age of Onset   Hypertension Mother    Diabetes Mother    Multiple myeloma Mother    Hypertension Father    Diabetes Father    Healthy Son    Healthy Daughter     ALLERGIES:  is allergic to atorvastatin, simvastatin, other, and statins.  MEDICATIONS:  Current Outpatient Medications  Medication Sig  Dispense Refill   EQ ASPIRIN ADULT LOW DOSE 81 MG tablet Take 81 mg by mouth daily.     ferrous sulfate 325 (65 FE) MG EC tablet Take 325 mg by mouth daily with breakfast.     finasteride (PROSCAR) 5 MG tablet Take 5 mg by mouth daily.     folic acid (FOLVITE) 1 MG tablet TAKE 2 TABLETS BY MOUTH DAILY 180 tablet 3   furosemide (LASIX) 40 MG tablet Take 1 tablet (40 mg total) by mouth daily. 90 tablet 3   furosemide (LASIX) 40 MG tablet Take 1 tablet by mouth daily.     hydrocortisone cream 0.5 % Apply 1 Application topically 3 (three) times daily. Apply small amount to affected area 3 times daily until healed 56 g 1   lenalidomide (REVLIMID) 10 MG capsule Take 1 capsule (10 mg total) by mouth daily. Take Day 1-21 21 capsule 0   montelukast (SINGULAIR) 10 MG tablet TAKE 1 TABLET BY MOUTH ON THE  DAY PRIOR TO  DARATUMUMAB  TREATMENT AND 1 TABLET BY MOUTH  DAILY FOR 2 DAYS AFTER TREATMENT 30 tablet 4   mupirocin cream (BACTROBAN) 2 % Apply 1 Application topically 2 (two) times daily. 30 g 0   tamsulosin (FLOMAX) 0.4 MG CAPS capsule Take 0.4 mg by mouth daily after supper.     Thiamine HCl (VITAMIN B-1) 250 MG tablet Take 1 tablet by mouth daily.     vitamin B-12 (CYANOCOBALAMIN) 100 MCG tablet Take 100 mcg by mouth daily.     Vitamin D-Vitamin K (K2-D3 MAX PO) Take by mouth.     Adalimumab (HUMIRA PEN) 40 MG/0.4ML PNKT Inject 40 mg into the skin every 14 (fourteen) days. (Patient not taking: Reported on 12/09/2021) 2 each 2   methotrexate (RHEUMATREX) 2.5 MG tablet TAKE 8 TABLETS BY MOUTH ONCE  WEEKLY . CAUTION: CHEMOTHERAPY.  PROTECTION FROM LIGHT (Patient not taking: Reported on 12/21/2021) 96 tablet 0   No current facility-administered medications for this visit.   Facility-Administered Medications Ordered in Other Visits  Medication Dose Route Frequency Provider Last Rate Last Admin   acetaminophen (TYLENOL) 325 MG tablet            dexamethasone (DECADRON) 4 MG tablet            diphenhydrAMINE  (BENADRYL) 25 mg capsule              PHYSICAL EXAMINATION: ECOG PERFORMANCE STATUS: 1 - Symptomatic but completely ambulatory Vitals:   01/11/22 0904  BP: 123/75  Pulse: 71  Resp: 18  SpO2: 98%   Filed Weights   01/11/22 0858  Weight: (!) 320 lb (145.2 kg)    Physical Exam Constitutional:      General: He is not in acute distress.    Appearance: He is obese.     Comments: Patient walks with a cane.  HENT:     Head: Normocephalic and atraumatic.  Eyes:     General: No scleral icterus. Cardiovascular:     Rate and Rhythm: Normal rate and regular rhythm.     Heart sounds: Normal heart sounds.  Pulmonary:     Effort: Pulmonary effort is normal. No respiratory distress.     Breath sounds: No wheezing.  Abdominal:     General: Bowel sounds are normal. There is no distension.     Palpations: Abdomen is soft.  Musculoskeletal:        General: No deformity. Normal range of motion.     Cervical back: Normal range of motion and neck supple.     Comments: Trace edema, bilateral low extremities.   Skin:    General: Skin is warm and dry.     Findings: No erythema or rash.  Neurological:     Mental Status: He is alert and oriented to person, place, and time. Mental status is at baseline.     Cranial Nerves: No cranial nerve deficit.     Coordination: Coordination normal.  Psychiatric:        Mood and Affect: Mood normal.      LABORATORY DATA:  I have reviewed the data as listed Lab Results  Component Value Date   WBC 7.8 01/11/2022   HGB 11.7 (L) 01/11/2022   HCT 35.5 (L) 01/11/2022   MCV 90.6 01/11/2022   PLT 217 01/11/2022   Recent Labs    12/28/21 0833 01/04/22 0819 01/11/22 0838  NA 138 137 137  K 3.9 3.8 4.0  CL 108 109 108  CO2 _0 GLUCOSE  94 92 102*  BUN _0 CREATININE 0.97 1.02 0.97  CALCIUM 8.9 8.5* 8.8*  GFRNONAA >60 >60 >60  PROT 6.8 6.3* 6.8  ALBUMIN 3.9 3.6 3.6  AST _1 ALT _2 ALKPHOS 72 69 70  BILITOT 0.5 0.3 0.3     Iron/TIBC/Ferritin/ %Sat    Component Value Date/Time   IRON 78 04/05/2021 1008   TIBC 274 04/05/2021 1008   FERRITIN 425 (H) 04/05/2021 1008   IRONPCTSAT 28 04/05/2021 1008      RADIOGRAPHIC STUDIES: I have personally reviewed the radiological images as listed and agreed with the findings in the report.  ECHOCARDIOGRAM COMPLETE  Result Date: 11/15/2021    ECHOCARDIOGRAM REPORT   Patient Name:   YOSGAR DEMIRJIAN Date of Exam: 11/15/2021 Medical Rec #:  [id number]         Height:       68.0 in Accession #:    [id number]        Weight:       326.0 lb Date of Birth:  1951-01-02         BSA:          2.515 m Patient Age:    71 years          BP:           131/68 mmHg Patient Gender: M                 HR:           63 bpm. Exam Location:  [facility] Procedure: 2D Echo, Color Doppler, Cardiac Doppler and Strain Analysis Indications:     Z09 Chemo                  M79.89 Leg swelling  History:         Patient has prior history of Echocardiogram examinations, most                  recent 06/07/2017. Prostate cancer, Multiple myeloma; Risk                  Factors:Former Smoker, Sleep Apnea, Hypertension and                  Dyslipidemia.  Sonographer:     Rosalia Hammers Referring Phys:  [id number] [place] Karene Bracken Diagnosing Phys: Ida Rogue MD  Sonographer Comments: Image acquisition challenging due to patient body habitus and Image acquisition challenging due to respiratory motion. Global longitudinal strain was attempted. IMPRESSIONS  1. Left ventricular ejection fraction, by estimation, is 60 to 65%. The left ventricle has normal function. The left ventricle has no regional wall motion abnormalities. Left ventricular diastolic parameters are consistent with Grade II diastolic dysfunction (pseudonormalization). The average left ventricular global longitudinal strain is -17.4 %.  2. Right ventricular systolic function is normal. The right ventricular size is normal. There is mildly elevated pulmonary artery systolic  pressure. The estimated right ventricular systolic pressure is 02.7 mmHg.  3. The mitral valve is normal in structure. Mild mitral valve regurgitation. No evidence of mitral stenosis.  4. The aortic valve was not well visualized. Aortic valve regurgitation is not visualized. No aortic stenosis is present.  5. The inferior vena cava is normal in size with greater than 50% respiratory variability, suggesting right atrial pressure of 3 mmHg. FINDINGS  Left Ventricle: Left ventricular ejection fraction, by estimation, is 60 to 65%. The left ventricle has normal function. The  left ventricle has no regional wall motion abnormalities. The average left ventricular global longitudinal strain is -17.4 %. The left ventricular internal cavity size was normal in size. There is no left ventricular hypertrophy. Left ventricular diastolic parameters are consistent with Grade II diastolic dysfunction (pseudonormalization). Right Ventricle: The right ventricular size is normal. No increase in right ventricular wall thickness. Right ventricular systolic function is normal. There is mildly elevated pulmonary artery systolic pressure. The tricuspid regurgitant velocity is 2.86  m/s, and with an assumed right atrial pressure of 5 mmHg, the estimated right ventricular systolic pressure is 09.7 mmHg. Left Atrium: Left atrial size was normal in size. Right Atrium: Right atrial size was normal in size. Pericardium: There is no evidence of pericardial effusion. Mitral Valve: The mitral valve is normal in structure. Mild mitral valve regurgitation. No evidence of mitral valve stenosis. Tricuspid Valve: The tricuspid valve is normal in structure. Tricuspid valve regurgitation is mild . No evidence of tricuspid stenosis. Aortic Valve: The aortic valve was not well visualized. Aortic valve regurgitation is not visualized. No aortic stenosis is present. Aortic valve mean gradient measures 4.0 mmHg. Aortic valve peak gradient measures 7.3 mmHg.  Aortic valve area, by VTI measures 2.11 cm. Pulmonic Valve: The pulmonic valve was normal in structure. Pulmonic valve regurgitation is not visualized. No evidence of pulmonic stenosis. Aorta: The aortic root is normal in size and structure. Venous: The inferior vena cava is normal in size with greater than 50% respiratory variability, suggesting right atrial pressure of 3 mmHg. IAS/Shunts: No atrial level shunt detected by color flow Doppler.  LEFT VENTRICLE PLAX 2D LVIDd:         4.68 cm   Diastology LVIDs:         3.15 cm   LV e' medial:    10.70 cm/s LV PW:         1.29 cm   LV E/e' medial:  9.9 LV IVS:        1.31 cm   LV e' lateral:   8.92 cm/s LVOT diam:     1.80 cm   LV E/e' lateral: 11.9 LV SV:         66 LV SV Index:   26        2D Longitudinal Strain LVOT Area:     2.54 cm  2D Strain GLS Avg:     -17.4 %                           3D Volume EF:                          3D EF:        62 %                          LV EDV:       239 ml                          LV ESV:       90 ml                          LV SV:        149 ml RIGHT VENTRICLE RV Basal diam:  3.12 cm RV Mid diam:    4.14 cm RV S  prime:     12.90 cm/s TAPSE (M-mode): 2.5 cm LEFT ATRIUM             Index        RIGHT ATRIUM           Index LA diam:        3.70 cm 1.47 cm/m   RA Area:     14.40 cm LA Vol (A2C):   58.4 ml 23.22 ml/m  RA Volume:   32.00 ml  12.72 ml/m LA Vol (A4C):   68.1 ml 27.07 ml/m LA Biplane Vol: 63.4 ml 25.20 ml/m  AORTIC VALVE AV Area (Vmax):    2.07 cm AV Area (Vmean):   1.89 cm AV Area (VTI):     2.11 cm AV Vmax:           135.00 cm/s AV Vmean:          94.400 cm/s AV VTI:            0.311 m AV Peak Grad:      7.3 mmHg AV Mean Grad:      4.0 mmHg LVOT Vmax:         110.00 cm/s LVOT Vmean:        70.000 cm/s LVOT VTI:          0.258 m LVOT/AV VTI ratio: 0.83  AORTA Ao Root diam: 2.90 cm MITRAL VALVE                TRICUSPID VALVE MV Area (PHT): 3.13 cm     TR Peak grad:   32.7 mmHg MV Decel Time: 242 msec     TR  Vmax:        286.00 cm/s MV E velocity: 106.00 cm/s MV A velocity: 90.00 cm/s   SHUNTS MV E/A ratio:  1.18         Systemic VTI:  0.26 m                             Systemic Diam: 1.80 cm Ida Rogue MD Electronically signed by Ida Rogue MD Signature Date/Time: 11/15/2021/12:51:33 PM    Final    US Venous Img Lower Bilateral  Result Date: 11/02/2021 CLINICAL DATA:  71 year old male with history of leg swelling. EXAM: BILATERAL LOWER EXTREMITY VENOUS DOPPLER ULTRASOUND TECHNIQUE: Gray-scale sonography with graded compression, as well as color Doppler and duplex ultrasound were performed to evaluate the lower extremity deep venous systems from the level of the common femoral vein and including the common femoral, femoral, profunda femoral, popliteal and calf veins including the posterior tibial, peroneal and gastrocnemius veins when visible. The superficial great saphenous vein was also interrogated. Spectral Doppler was utilized to evaluate flow at rest and with distal augmentation maneuvers in the common femoral, femoral and popliteal veins. COMPARISON:  None Available. FINDINGS: RIGHT LOWER EXTREMITY Common Femoral Vein: No evidence of thrombus. Normal compressibility, respiratory phasicity and response to augmentation. Saphenofemoral Junction: No evidence of thrombus. Normal compressibility and flow on color Doppler imaging. Profunda Femoral Vein: No evidence of thrombus. Normal compressibility and flow on color Doppler imaging. Femoral Vein: No evidence of thrombus. Normal compressibility, respiratory phasicity and response to augmentation. Popliteal Vein: No evidence of thrombus. Normal compressibility, respiratory phasicity and response to augmentation. Calf Veins: No evidence of thrombus. Normal compressibility and flow on color Doppler imaging. Other Findings:  None. LEFT LOWER EXTREMITY Common Femoral Vein: No evidence of thrombus. Normal compressibility, respiratory  phasicity and response to  augmentation. Saphenofemoral Junction: No evidence of thrombus. Normal compressibility and flow on color Doppler imaging. Profunda Femoral Vein: No evidence of thrombus. Normal compressibility and flow on color Doppler imaging. Femoral Vein: No evidence of thrombus. Normal compressibility, respiratory phasicity and response to augmentation. Popliteal Vein: No evidence of thrombus. Normal compressibility, respiratory phasicity and response to augmentation. Calf Veins: No evidence of thrombus. Normal compressibility and flow on color Doppler imaging. Other Findings:  None. IMPRESSION: No evidence of bilateral lower extremity deep venous thrombosis. Ruthann Cancer, MD [facility] Electronically Signed   By: Ruthann Cancer M.D.   On: 11/02/2021 14:23

## 2022-01-12 ENCOUNTER — Other Ambulatory Visit: Payer: Self-pay

## 2022-01-12 ENCOUNTER — Other Ambulatory Visit: Payer: Self-pay | Admitting: Oncology

## 2022-01-13 ENCOUNTER — Telehealth: Payer: Self-pay

## 2022-01-13 LAB — KAPPA/LAMBDA LIGHT CHAINS
Kappa free light chain: 20 mg/L — ABNORMAL HIGH (ref 3.3–19.4)
Kappa, lambda light chain ratio: 2.15 — ABNORMAL HIGH (ref 0.26–1.65)
Lambda free light chains: 9.3 mg/L (ref 5.7–26.3)

## 2022-01-13 NOTE — Telephone Encounter (Signed)
-----   Message from Earlie Server, MD sent at 01/12/2022  7:01 PM EDT ----- Please cancel lab MD treatment on 11/8. Please advise patient to continue revlimid until 01/20/22.

## 2022-01-13 NOTE — Telephone Encounter (Signed)
Called and spoke to pt. He is aware of plan and verbalized understanding.   Please cancel appt on 11/8.

## 2022-01-16 LAB — MULTIPLE MYELOMA PANEL, SERUM
Albumin SerPl Elph-Mcnc: 3.4 g/dL (ref 2.9–4.4)
Albumin/Glob SerPl: 1.3 (ref 0.7–1.7)
Alpha 1: 0.3 g/dL (ref 0.0–0.4)
Alpha2 Glob SerPl Elph-Mcnc: 0.7 g/dL (ref 0.4–1.0)
B-Globulin SerPl Elph-Mcnc: 1 g/dL (ref 0.7–1.3)
Gamma Glob SerPl Elph-Mcnc: 0.7 g/dL (ref 0.4–1.8)
Globulin, Total: 2.7 g/dL (ref 2.2–3.9)
IgA: 91 mg/dL (ref 61–437)
IgG (Immunoglobin G), Serum: 678 mg/dL (ref 603–1613)
IgM (Immunoglobulin M), Srm: 51 mg/dL (ref 20–172)
Total Protein ELP: 6.1 g/dL (ref 6.0–8.5)

## 2022-01-18 ENCOUNTER — Encounter: Payer: Self-pay | Admitting: Physician Assistant

## 2022-01-18 ENCOUNTER — Ambulatory Visit: Payer: Medicare Other | Attending: Physician Assistant | Admitting: Physician Assistant

## 2022-01-18 ENCOUNTER — Ambulatory Visit: Payer: Medicare Other | Admitting: Physician Assistant

## 2022-01-18 VITALS — BP 120/80 | HR 76 | Resp 18 | Ht 68.0 in | Wt 316.2 lb

## 2022-01-18 DIAGNOSIS — M5000 Cervical disc disorder with myelopathy, unspecified cervical region: Secondary | ICD-10-CM | POA: Diagnosis not present

## 2022-01-18 DIAGNOSIS — M19012 Primary osteoarthritis, left shoulder: Secondary | ICD-10-CM | POA: Diagnosis not present

## 2022-01-18 DIAGNOSIS — Z79899 Other long term (current) drug therapy: Secondary | ICD-10-CM | POA: Diagnosis not present

## 2022-01-18 DIAGNOSIS — M24521 Contracture, right elbow: Secondary | ICD-10-CM | POA: Diagnosis not present

## 2022-01-18 DIAGNOSIS — Z9889 Other specified postprocedural states: Secondary | ICD-10-CM

## 2022-01-18 DIAGNOSIS — K8020 Calculus of gallbladder without cholecystitis without obstruction: Secondary | ICD-10-CM

## 2022-01-18 DIAGNOSIS — Z111 Encounter for screening for respiratory tuberculosis: Secondary | ICD-10-CM

## 2022-01-18 DIAGNOSIS — C9000 Multiple myeloma not having achieved remission: Secondary | ICD-10-CM | POA: Diagnosis not present

## 2022-01-18 DIAGNOSIS — M0579 Rheumatoid arthritis with rheumatoid factor of multiple sites without organ or systems involvement: Secondary | ICD-10-CM

## 2022-01-18 DIAGNOSIS — M19011 Primary osteoarthritis, right shoulder: Secondary | ICD-10-CM

## 2022-01-19 ENCOUNTER — Other Ambulatory Visit: Payer: Self-pay

## 2022-01-20 DIAGNOSIS — Z03818 Encounter for observation for suspected exposure to other biological agents ruled out: Secondary | ICD-10-CM | POA: Diagnosis not present

## 2022-01-20 DIAGNOSIS — R7989 Other specified abnormal findings of blood chemistry: Secondary | ICD-10-CM | POA: Diagnosis not present

## 2022-01-20 DIAGNOSIS — Z01818 Encounter for other preprocedural examination: Secondary | ICD-10-CM | POA: Diagnosis not present

## 2022-01-20 DIAGNOSIS — Z01811 Encounter for preprocedural respiratory examination: Secondary | ICD-10-CM | POA: Diagnosis not present

## 2022-01-20 DIAGNOSIS — Z1159 Encounter for screening for other viral diseases: Secondary | ICD-10-CM | POA: Diagnosis not present

## 2022-01-20 DIAGNOSIS — C9000 Multiple myeloma not having achieved remission: Secondary | ICD-10-CM | POA: Diagnosis not present

## 2022-01-20 DIAGNOSIS — Z5181 Encounter for therapeutic drug level monitoring: Secondary | ICD-10-CM | POA: Diagnosis not present

## 2022-01-20 DIAGNOSIS — Z79899 Other long term (current) drug therapy: Secondary | ICD-10-CM | POA: Diagnosis not present

## 2022-01-20 DIAGNOSIS — F17210 Nicotine dependence, cigarettes, uncomplicated: Secondary | ICD-10-CM | POA: Diagnosis not present

## 2022-01-21 DIAGNOSIS — Z01810 Encounter for preprocedural cardiovascular examination: Secondary | ICD-10-CM | POA: Diagnosis not present

## 2022-01-23 DIAGNOSIS — Z01810 Encounter for preprocedural cardiovascular examination: Secondary | ICD-10-CM | POA: Diagnosis not present

## 2022-01-23 DIAGNOSIS — Z01818 Encounter for other preprocedural examination: Secondary | ICD-10-CM | POA: Diagnosis not present

## 2022-01-23 DIAGNOSIS — C9000 Multiple myeloma not having achieved remission: Secondary | ICD-10-CM | POA: Diagnosis not present

## 2022-01-23 DIAGNOSIS — R9431 Abnormal electrocardiogram [ECG] [EKG]: Secondary | ICD-10-CM | POA: Diagnosis not present

## 2022-01-24 DIAGNOSIS — C9000 Multiple myeloma not having achieved remission: Secondary | ICD-10-CM | POA: Diagnosis not present

## 2022-01-24 DIAGNOSIS — E785 Hyperlipidemia, unspecified: Secondary | ICD-10-CM | POA: Diagnosis not present

## 2022-01-24 DIAGNOSIS — Z0189 Encounter for other specified special examinations: Secondary | ICD-10-CM | POA: Diagnosis not present

## 2022-01-25 DIAGNOSIS — C9000 Multiple myeloma not having achieved remission: Secondary | ICD-10-CM | POA: Diagnosis not present

## 2022-01-26 DIAGNOSIS — R942 Abnormal results of pulmonary function studies: Secondary | ICD-10-CM | POA: Diagnosis not present

## 2022-01-26 DIAGNOSIS — F17210 Nicotine dependence, cigarettes, uncomplicated: Secondary | ICD-10-CM | POA: Diagnosis not present

## 2022-01-31 DIAGNOSIS — C9000 Multiple myeloma not having achieved remission: Secondary | ICD-10-CM | POA: Diagnosis not present

## 2022-01-31 DIAGNOSIS — Z01818 Encounter for other preprocedural examination: Secondary | ICD-10-CM | POA: Diagnosis not present

## 2022-01-31 DIAGNOSIS — E041 Nontoxic single thyroid nodule: Secondary | ICD-10-CM | POA: Diagnosis not present

## 2022-02-01 HISTORY — PX: OTHER SURGICAL HISTORY: SHX169

## 2022-02-02 DIAGNOSIS — I10 Essential (primary) hypertension: Secondary | ICD-10-CM | POA: Diagnosis not present

## 2022-02-02 DIAGNOSIS — M0579 Rheumatoid arthritis with rheumatoid factor of multiple sites without organ or systems involvement: Secondary | ICD-10-CM | POA: Diagnosis not present

## 2022-02-02 DIAGNOSIS — D638 Anemia in other chronic diseases classified elsewhere: Secondary | ICD-10-CM | POA: Diagnosis not present

## 2022-02-02 DIAGNOSIS — E7800 Pure hypercholesterolemia, unspecified: Secondary | ICD-10-CM | POA: Diagnosis not present

## 2022-02-02 DIAGNOSIS — G720 Drug-induced myopathy: Secondary | ICD-10-CM | POA: Diagnosis not present

## 2022-02-02 DIAGNOSIS — C9000 Multiple myeloma not having achieved remission: Secondary | ICD-10-CM | POA: Diagnosis not present

## 2022-02-03 ENCOUNTER — Other Ambulatory Visit: Payer: Self-pay | Admitting: *Deleted

## 2022-02-03 DIAGNOSIS — C9000 Multiple myeloma not having achieved remission: Secondary | ICD-10-CM | POA: Diagnosis not present

## 2022-02-03 DIAGNOSIS — Z52011 Autologous donor, stem cells: Secondary | ICD-10-CM | POA: Diagnosis not present

## 2022-02-04 DIAGNOSIS — C9000 Multiple myeloma not having achieved remission: Secondary | ICD-10-CM | POA: Diagnosis not present

## 2022-02-04 DIAGNOSIS — Z52011 Autologous donor, stem cells: Secondary | ICD-10-CM | POA: Diagnosis not present

## 2022-02-05 DIAGNOSIS — C9000 Multiple myeloma not having achieved remission: Secondary | ICD-10-CM | POA: Diagnosis not present

## 2022-02-05 DIAGNOSIS — Z52011 Autologous donor, stem cells: Secondary | ICD-10-CM | POA: Diagnosis not present

## 2022-02-06 DIAGNOSIS — Z01818 Encounter for other preprocedural examination: Secondary | ICD-10-CM | POA: Diagnosis not present

## 2022-02-06 DIAGNOSIS — Z79899 Other long term (current) drug therapy: Secondary | ICD-10-CM | POA: Diagnosis not present

## 2022-02-06 DIAGNOSIS — E041 Nontoxic single thyroid nodule: Secondary | ICD-10-CM | POA: Diagnosis not present

## 2022-02-06 DIAGNOSIS — Z5181 Encounter for therapeutic drug level monitoring: Secondary | ICD-10-CM | POA: Diagnosis not present

## 2022-02-06 DIAGNOSIS — C9000 Multiple myeloma not having achieved remission: Secondary | ICD-10-CM | POA: Diagnosis not present

## 2022-02-07 DIAGNOSIS — E041 Nontoxic single thyroid nodule: Secondary | ICD-10-CM | POA: Diagnosis not present

## 2022-02-07 DIAGNOSIS — C9000 Multiple myeloma not having achieved remission: Secondary | ICD-10-CM | POA: Diagnosis not present

## 2022-02-07 DIAGNOSIS — R7989 Other specified abnormal findings of blood chemistry: Secondary | ICD-10-CM | POA: Diagnosis not present

## 2022-02-07 DIAGNOSIS — Z52011 Autologous donor, stem cells: Secondary | ICD-10-CM | POA: Diagnosis not present

## 2022-02-08 ENCOUNTER — Other Ambulatory Visit: Payer: Medicare Other

## 2022-02-08 ENCOUNTER — Ambulatory Visit: Payer: Medicare Other | Admitting: Oncology

## 2022-02-08 ENCOUNTER — Ambulatory Visit: Payer: Medicare Other

## 2022-02-13 DIAGNOSIS — I5020 Unspecified systolic (congestive) heart failure: Secondary | ICD-10-CM | POA: Diagnosis not present

## 2022-02-13 DIAGNOSIS — I509 Heart failure, unspecified: Secondary | ICD-10-CM | POA: Diagnosis not present

## 2022-02-13 DIAGNOSIS — K089 Disorder of teeth and supporting structures, unspecified: Secondary | ICD-10-CM | POA: Diagnosis not present

## 2022-02-13 DIAGNOSIS — Z01818 Encounter for other preprocedural examination: Secondary | ICD-10-CM | POA: Diagnosis not present

## 2022-02-13 DIAGNOSIS — M069 Rheumatoid arthritis, unspecified: Secondary | ICD-10-CM | POA: Diagnosis not present

## 2022-02-13 DIAGNOSIS — R42 Dizziness and giddiness: Secondary | ICD-10-CM | POA: Diagnosis not present

## 2022-02-13 DIAGNOSIS — K007 Teething syndrome: Secondary | ICD-10-CM | POA: Diagnosis not present

## 2022-02-13 DIAGNOSIS — C9001 Multiple myeloma in remission: Secondary | ICD-10-CM | POA: Diagnosis not present

## 2022-02-13 DIAGNOSIS — Z79899 Other long term (current) drug therapy: Secondary | ICD-10-CM | POA: Diagnosis not present

## 2022-02-13 DIAGNOSIS — Z20822 Contact with and (suspected) exposure to covid-19: Secondary | ICD-10-CM | POA: Diagnosis not present

## 2022-02-13 DIAGNOSIS — K5900 Constipation, unspecified: Secondary | ICD-10-CM | POA: Diagnosis not present

## 2022-02-14 DIAGNOSIS — Z79624 Long term (current) use of inhibitors of nucleotide synthesis: Secondary | ICD-10-CM | POA: Diagnosis not present

## 2022-02-14 DIAGNOSIS — K5900 Constipation, unspecified: Secondary | ICD-10-CM | POA: Diagnosis not present

## 2022-02-14 DIAGNOSIS — C9001 Multiple myeloma in remission: Secondary | ICD-10-CM | POA: Diagnosis not present

## 2022-02-14 DIAGNOSIS — R112 Nausea with vomiting, unspecified: Secondary | ICD-10-CM | POA: Diagnosis not present

## 2022-02-14 DIAGNOSIS — Z79899 Other long term (current) drug therapy: Secondary | ICD-10-CM | POA: Diagnosis not present

## 2022-02-14 DIAGNOSIS — D61810 Antineoplastic chemotherapy induced pancytopenia: Secondary | ICD-10-CM | POA: Diagnosis not present

## 2022-02-14 DIAGNOSIS — M069 Rheumatoid arthritis, unspecified: Secondary | ICD-10-CM | POA: Diagnosis not present

## 2022-02-14 DIAGNOSIS — Z7952 Long term (current) use of systemic steroids: Secondary | ICD-10-CM | POA: Diagnosis not present

## 2022-02-14 DIAGNOSIS — I509 Heart failure, unspecified: Secondary | ICD-10-CM | POA: Diagnosis not present

## 2022-02-14 DIAGNOSIS — K089 Disorder of teeth and supporting structures, unspecified: Secondary | ICD-10-CM | POA: Diagnosis not present

## 2022-02-14 DIAGNOSIS — T451X5D Adverse effect of antineoplastic and immunosuppressive drugs, subsequent encounter: Secondary | ICD-10-CM | POA: Diagnosis not present

## 2022-02-14 DIAGNOSIS — C9000 Multiple myeloma not having achieved remission: Secondary | ICD-10-CM | POA: Diagnosis not present

## 2022-02-15 DIAGNOSIS — Z7952 Long term (current) use of systemic steroids: Secondary | ICD-10-CM | POA: Diagnosis not present

## 2022-02-15 DIAGNOSIS — T451X5D Adverse effect of antineoplastic and immunosuppressive drugs, subsequent encounter: Secondary | ICD-10-CM | POA: Diagnosis not present

## 2022-02-15 DIAGNOSIS — C9000 Multiple myeloma not having achieved remission: Secondary | ICD-10-CM | POA: Diagnosis not present

## 2022-02-15 DIAGNOSIS — C9001 Multiple myeloma in remission: Secondary | ICD-10-CM | POA: Diagnosis not present

## 2022-02-15 DIAGNOSIS — Z79899 Other long term (current) drug therapy: Secondary | ICD-10-CM | POA: Diagnosis not present

## 2022-02-15 DIAGNOSIS — Z9484 Stem cells transplant status: Secondary | ICD-10-CM | POA: Diagnosis not present

## 2022-02-15 DIAGNOSIS — D61810 Antineoplastic chemotherapy induced pancytopenia: Secondary | ICD-10-CM | POA: Diagnosis not present

## 2022-02-15 DIAGNOSIS — Z79624 Long term (current) use of inhibitors of nucleotide synthesis: Secondary | ICD-10-CM | POA: Diagnosis not present

## 2022-02-15 DIAGNOSIS — R112 Nausea with vomiting, unspecified: Secondary | ICD-10-CM | POA: Diagnosis not present

## 2022-02-16 DIAGNOSIS — R066 Hiccough: Secondary | ICD-10-CM | POA: Diagnosis not present

## 2022-02-16 DIAGNOSIS — I509 Heart failure, unspecified: Secondary | ICD-10-CM | POA: Diagnosis not present

## 2022-02-16 DIAGNOSIS — T451X5D Adverse effect of antineoplastic and immunosuppressive drugs, subsequent encounter: Secondary | ICD-10-CM | POA: Diagnosis not present

## 2022-02-16 DIAGNOSIS — D61810 Antineoplastic chemotherapy induced pancytopenia: Secondary | ICD-10-CM | POA: Diagnosis not present

## 2022-02-16 DIAGNOSIS — R112 Nausea with vomiting, unspecified: Secondary | ICD-10-CM | POA: Diagnosis not present

## 2022-02-16 DIAGNOSIS — K5900 Constipation, unspecified: Secondary | ICD-10-CM | POA: Diagnosis not present

## 2022-02-16 DIAGNOSIS — Z9484 Stem cells transplant status: Secondary | ICD-10-CM | POA: Diagnosis not present

## 2022-02-16 DIAGNOSIS — C9000 Multiple myeloma not having achieved remission: Secondary | ICD-10-CM | POA: Diagnosis not present

## 2022-02-16 DIAGNOSIS — Z79624 Long term (current) use of inhibitors of nucleotide synthesis: Secondary | ICD-10-CM | POA: Diagnosis not present

## 2022-02-16 DIAGNOSIS — Z79899 Other long term (current) drug therapy: Secondary | ICD-10-CM | POA: Diagnosis not present

## 2022-02-16 DIAGNOSIS — M069 Rheumatoid arthritis, unspecified: Secondary | ICD-10-CM | POA: Diagnosis not present

## 2022-02-17 DIAGNOSIS — T451X5D Adverse effect of antineoplastic and immunosuppressive drugs, subsequent encounter: Secondary | ICD-10-CM | POA: Diagnosis not present

## 2022-02-17 DIAGNOSIS — Z79624 Long term (current) use of inhibitors of nucleotide synthesis: Secondary | ICD-10-CM | POA: Diagnosis not present

## 2022-02-17 DIAGNOSIS — R112 Nausea with vomiting, unspecified: Secondary | ICD-10-CM | POA: Diagnosis not present

## 2022-02-17 DIAGNOSIS — D61810 Antineoplastic chemotherapy induced pancytopenia: Secondary | ICD-10-CM | POA: Diagnosis not present

## 2022-02-17 DIAGNOSIS — Z79899 Other long term (current) drug therapy: Secondary | ICD-10-CM | POA: Diagnosis not present

## 2022-02-17 DIAGNOSIS — Z9484 Stem cells transplant status: Secondary | ICD-10-CM | POA: Diagnosis not present

## 2022-02-17 DIAGNOSIS — C9000 Multiple myeloma not having achieved remission: Secondary | ICD-10-CM | POA: Diagnosis not present

## 2022-02-18 DIAGNOSIS — R112 Nausea with vomiting, unspecified: Secondary | ICD-10-CM | POA: Diagnosis not present

## 2022-02-18 DIAGNOSIS — C9000 Multiple myeloma not having achieved remission: Secondary | ICD-10-CM | POA: Diagnosis not present

## 2022-02-18 DIAGNOSIS — Z79899 Other long term (current) drug therapy: Secondary | ICD-10-CM | POA: Diagnosis not present

## 2022-02-18 DIAGNOSIS — Z79624 Long term (current) use of inhibitors of nucleotide synthesis: Secondary | ICD-10-CM | POA: Diagnosis not present

## 2022-02-18 DIAGNOSIS — D61810 Antineoplastic chemotherapy induced pancytopenia: Secondary | ICD-10-CM | POA: Diagnosis not present

## 2022-02-18 DIAGNOSIS — T451X5D Adverse effect of antineoplastic and immunosuppressive drugs, subsequent encounter: Secondary | ICD-10-CM | POA: Diagnosis not present

## 2022-02-18 DIAGNOSIS — Z9484 Stem cells transplant status: Secondary | ICD-10-CM | POA: Diagnosis not present

## 2022-02-19 DIAGNOSIS — T451X5D Adverse effect of antineoplastic and immunosuppressive drugs, subsequent encounter: Secondary | ICD-10-CM | POA: Diagnosis not present

## 2022-02-19 DIAGNOSIS — R109 Unspecified abdominal pain: Secondary | ICD-10-CM | POA: Diagnosis not present

## 2022-02-19 DIAGNOSIS — M069 Rheumatoid arthritis, unspecified: Secondary | ICD-10-CM | POA: Diagnosis not present

## 2022-02-19 DIAGNOSIS — D61810 Antineoplastic chemotherapy induced pancytopenia: Secondary | ICD-10-CM | POA: Diagnosis not present

## 2022-02-19 DIAGNOSIS — K5900 Constipation, unspecified: Secondary | ICD-10-CM | POA: Diagnosis not present

## 2022-02-19 DIAGNOSIS — I509 Heart failure, unspecified: Secondary | ICD-10-CM | POA: Diagnosis not present

## 2022-02-19 DIAGNOSIS — Z79624 Long term (current) use of inhibitors of nucleotide synthesis: Secondary | ICD-10-CM | POA: Diagnosis not present

## 2022-02-19 DIAGNOSIS — C9001 Multiple myeloma in remission: Secondary | ICD-10-CM | POA: Diagnosis not present

## 2022-02-19 DIAGNOSIS — C9000 Multiple myeloma not having achieved remission: Secondary | ICD-10-CM | POA: Diagnosis not present

## 2022-02-19 DIAGNOSIS — Z79899 Other long term (current) drug therapy: Secondary | ICD-10-CM | POA: Diagnosis not present

## 2022-02-19 DIAGNOSIS — Z9484 Stem cells transplant status: Secondary | ICD-10-CM | POA: Diagnosis not present

## 2022-02-19 DIAGNOSIS — K089 Disorder of teeth and supporting structures, unspecified: Secondary | ICD-10-CM | POA: Diagnosis not present

## 2022-02-19 DIAGNOSIS — R112 Nausea with vomiting, unspecified: Secondary | ICD-10-CM | POA: Diagnosis not present

## 2022-02-19 DIAGNOSIS — K0889 Other specified disorders of teeth and supporting structures: Secondary | ICD-10-CM | POA: Diagnosis not present

## 2022-02-20 DIAGNOSIS — D61810 Antineoplastic chemotherapy induced pancytopenia: Secondary | ICD-10-CM | POA: Diagnosis not present

## 2022-02-20 DIAGNOSIS — Z79624 Long term (current) use of inhibitors of nucleotide synthesis: Secondary | ICD-10-CM | POA: Diagnosis not present

## 2022-02-20 DIAGNOSIS — T451X5D Adverse effect of antineoplastic and immunosuppressive drugs, subsequent encounter: Secondary | ICD-10-CM | POA: Diagnosis not present

## 2022-02-20 DIAGNOSIS — Z9484 Stem cells transplant status: Secondary | ICD-10-CM | POA: Diagnosis not present

## 2022-02-20 DIAGNOSIS — R112 Nausea with vomiting, unspecified: Secondary | ICD-10-CM | POA: Diagnosis not present

## 2022-02-20 DIAGNOSIS — C9000 Multiple myeloma not having achieved remission: Secondary | ICD-10-CM | POA: Diagnosis not present

## 2022-02-20 DIAGNOSIS — R109 Unspecified abdominal pain: Secondary | ICD-10-CM | POA: Diagnosis not present

## 2022-02-20 DIAGNOSIS — Z79899 Other long term (current) drug therapy: Secondary | ICD-10-CM | POA: Diagnosis not present

## 2022-02-21 DIAGNOSIS — C9001 Multiple myeloma in remission: Secondary | ICD-10-CM | POA: Diagnosis not present

## 2022-02-21 DIAGNOSIS — I509 Heart failure, unspecified: Secondary | ICD-10-CM | POA: Diagnosis not present

## 2022-02-21 DIAGNOSIS — M069 Rheumatoid arthritis, unspecified: Secondary | ICD-10-CM | POA: Diagnosis not present

## 2022-02-21 DIAGNOSIS — K5900 Constipation, unspecified: Secondary | ICD-10-CM | POA: Diagnosis not present

## 2022-02-21 DIAGNOSIS — Z9484 Stem cells transplant status: Secondary | ICD-10-CM | POA: Diagnosis not present

## 2022-02-21 DIAGNOSIS — R112 Nausea with vomiting, unspecified: Secondary | ICD-10-CM | POA: Diagnosis not present

## 2022-02-21 DIAGNOSIS — D61810 Antineoplastic chemotherapy induced pancytopenia: Secondary | ICD-10-CM | POA: Diagnosis not present

## 2022-02-21 DIAGNOSIS — C9000 Multiple myeloma not having achieved remission: Secondary | ICD-10-CM | POA: Diagnosis not present

## 2022-02-21 DIAGNOSIS — Z79624 Long term (current) use of inhibitors of nucleotide synthesis: Secondary | ICD-10-CM | POA: Diagnosis not present

## 2022-02-21 DIAGNOSIS — K089 Disorder of teeth and supporting structures, unspecified: Secondary | ICD-10-CM | POA: Diagnosis not present

## 2022-02-21 DIAGNOSIS — Z79899 Other long term (current) drug therapy: Secondary | ICD-10-CM | POA: Diagnosis not present

## 2022-02-21 DIAGNOSIS — T451X5D Adverse effect of antineoplastic and immunosuppressive drugs, subsequent encounter: Secondary | ICD-10-CM | POA: Diagnosis not present

## 2022-02-22 DIAGNOSIS — Z79899 Other long term (current) drug therapy: Secondary | ICD-10-CM | POA: Diagnosis not present

## 2022-02-22 DIAGNOSIS — R066 Hiccough: Secondary | ICD-10-CM | POA: Diagnosis not present

## 2022-02-22 DIAGNOSIS — R112 Nausea with vomiting, unspecified: Secondary | ICD-10-CM | POA: Diagnosis not present

## 2022-02-22 DIAGNOSIS — R197 Diarrhea, unspecified: Secondary | ICD-10-CM | POA: Diagnosis not present

## 2022-02-22 DIAGNOSIS — T451X5D Adverse effect of antineoplastic and immunosuppressive drugs, subsequent encounter: Secondary | ICD-10-CM | POA: Diagnosis not present

## 2022-02-22 DIAGNOSIS — Z9484 Stem cells transplant status: Secondary | ICD-10-CM | POA: Diagnosis not present

## 2022-02-22 DIAGNOSIS — C9000 Multiple myeloma not having achieved remission: Secondary | ICD-10-CM | POA: Diagnosis not present

## 2022-02-22 DIAGNOSIS — K089 Disorder of teeth and supporting structures, unspecified: Secondary | ICD-10-CM | POA: Diagnosis not present

## 2022-02-22 DIAGNOSIS — Z79624 Long term (current) use of inhibitors of nucleotide synthesis: Secondary | ICD-10-CM | POA: Diagnosis not present

## 2022-02-22 DIAGNOSIS — C9001 Multiple myeloma in remission: Secondary | ICD-10-CM | POA: Diagnosis not present

## 2022-02-22 DIAGNOSIS — M069 Rheumatoid arthritis, unspecified: Secondary | ICD-10-CM | POA: Diagnosis not present

## 2022-02-22 DIAGNOSIS — I509 Heart failure, unspecified: Secondary | ICD-10-CM | POA: Diagnosis not present

## 2022-02-22 DIAGNOSIS — D61810 Antineoplastic chemotherapy induced pancytopenia: Secondary | ICD-10-CM | POA: Diagnosis not present

## 2022-02-23 DIAGNOSIS — Z79899 Other long term (current) drug therapy: Secondary | ICD-10-CM | POA: Diagnosis not present

## 2022-02-23 DIAGNOSIS — R112 Nausea with vomiting, unspecified: Secondary | ICD-10-CM | POA: Diagnosis not present

## 2022-02-23 DIAGNOSIS — D61810 Antineoplastic chemotherapy induced pancytopenia: Secondary | ICD-10-CM | POA: Diagnosis not present

## 2022-02-23 DIAGNOSIS — T451X5D Adverse effect of antineoplastic and immunosuppressive drugs, subsequent encounter: Secondary | ICD-10-CM | POA: Diagnosis not present

## 2022-02-23 DIAGNOSIS — C9000 Multiple myeloma not having achieved remission: Secondary | ICD-10-CM | POA: Diagnosis not present

## 2022-02-23 DIAGNOSIS — Z9484 Stem cells transplant status: Secondary | ICD-10-CM | POA: Diagnosis not present

## 2022-02-23 DIAGNOSIS — Z79624 Long term (current) use of inhibitors of nucleotide synthesis: Secondary | ICD-10-CM | POA: Diagnosis not present

## 2022-02-24 DIAGNOSIS — Z79624 Long term (current) use of inhibitors of nucleotide synthesis: Secondary | ICD-10-CM | POA: Diagnosis not present

## 2022-02-24 DIAGNOSIS — D61810 Antineoplastic chemotherapy induced pancytopenia: Secondary | ICD-10-CM | POA: Diagnosis not present

## 2022-02-24 DIAGNOSIS — T451X5D Adverse effect of antineoplastic and immunosuppressive drugs, subsequent encounter: Secondary | ICD-10-CM | POA: Diagnosis not present

## 2022-02-24 DIAGNOSIS — K089 Disorder of teeth and supporting structures, unspecified: Secondary | ICD-10-CM | POA: Diagnosis not present

## 2022-02-24 DIAGNOSIS — I509 Heart failure, unspecified: Secondary | ICD-10-CM | POA: Diagnosis not present

## 2022-02-24 DIAGNOSIS — Z79899 Other long term (current) drug therapy: Secondary | ICD-10-CM | POA: Diagnosis not present

## 2022-02-24 DIAGNOSIS — M069 Rheumatoid arthritis, unspecified: Secondary | ICD-10-CM | POA: Diagnosis not present

## 2022-02-24 DIAGNOSIS — C9001 Multiple myeloma in remission: Secondary | ICD-10-CM | POA: Diagnosis not present

## 2022-02-24 DIAGNOSIS — R112 Nausea with vomiting, unspecified: Secondary | ICD-10-CM | POA: Diagnosis not present

## 2022-02-24 DIAGNOSIS — R197 Diarrhea, unspecified: Secondary | ICD-10-CM | POA: Diagnosis not present

## 2022-02-24 DIAGNOSIS — Z9484 Stem cells transplant status: Secondary | ICD-10-CM | POA: Diagnosis not present

## 2022-02-24 DIAGNOSIS — C9000 Multiple myeloma not having achieved remission: Secondary | ICD-10-CM | POA: Diagnosis not present

## 2022-02-25 DIAGNOSIS — C9000 Multiple myeloma not having achieved remission: Secondary | ICD-10-CM | POA: Diagnosis not present

## 2022-02-25 DIAGNOSIS — K089 Disorder of teeth and supporting structures, unspecified: Secondary | ICD-10-CM | POA: Diagnosis not present

## 2022-02-25 DIAGNOSIS — D61810 Antineoplastic chemotherapy induced pancytopenia: Secondary | ICD-10-CM | POA: Diagnosis not present

## 2022-02-25 DIAGNOSIS — C9001 Multiple myeloma in remission: Secondary | ICD-10-CM | POA: Diagnosis not present

## 2022-02-25 DIAGNOSIS — Z79624 Long term (current) use of inhibitors of nucleotide synthesis: Secondary | ICD-10-CM | POA: Diagnosis not present

## 2022-02-25 DIAGNOSIS — T451X5D Adverse effect of antineoplastic and immunosuppressive drugs, subsequent encounter: Secondary | ICD-10-CM | POA: Diagnosis not present

## 2022-02-25 DIAGNOSIS — Z79899 Other long term (current) drug therapy: Secondary | ICD-10-CM | POA: Diagnosis not present

## 2022-02-25 DIAGNOSIS — M069 Rheumatoid arthritis, unspecified: Secondary | ICD-10-CM | POA: Diagnosis not present

## 2022-02-25 DIAGNOSIS — I509 Heart failure, unspecified: Secondary | ICD-10-CM | POA: Diagnosis not present

## 2022-02-25 DIAGNOSIS — R112 Nausea with vomiting, unspecified: Secondary | ICD-10-CM | POA: Diagnosis not present

## 2022-02-25 DIAGNOSIS — R197 Diarrhea, unspecified: Secondary | ICD-10-CM | POA: Diagnosis not present

## 2022-02-25 DIAGNOSIS — Z9484 Stem cells transplant status: Secondary | ICD-10-CM | POA: Diagnosis not present

## 2022-02-26 DIAGNOSIS — Z9484 Stem cells transplant status: Secondary | ICD-10-CM | POA: Diagnosis not present

## 2022-02-26 DIAGNOSIS — D61810 Antineoplastic chemotherapy induced pancytopenia: Secondary | ICD-10-CM | POA: Diagnosis not present

## 2022-02-26 DIAGNOSIS — R109 Unspecified abdominal pain: Secondary | ICD-10-CM | POA: Diagnosis not present

## 2022-02-26 DIAGNOSIS — R112 Nausea with vomiting, unspecified: Secondary | ICD-10-CM | POA: Diagnosis not present

## 2022-02-26 DIAGNOSIS — Z79899 Other long term (current) drug therapy: Secondary | ICD-10-CM | POA: Diagnosis not present

## 2022-02-26 DIAGNOSIS — R197 Diarrhea, unspecified: Secondary | ICD-10-CM | POA: Diagnosis not present

## 2022-02-26 DIAGNOSIS — C9000 Multiple myeloma not having achieved remission: Secondary | ICD-10-CM | POA: Diagnosis not present

## 2022-02-26 DIAGNOSIS — T451X5D Adverse effect of antineoplastic and immunosuppressive drugs, subsequent encounter: Secondary | ICD-10-CM | POA: Diagnosis not present

## 2022-02-26 DIAGNOSIS — Z79624 Long term (current) use of inhibitors of nucleotide synthesis: Secondary | ICD-10-CM | POA: Diagnosis not present

## 2022-02-27 DIAGNOSIS — T451X5D Adverse effect of antineoplastic and immunosuppressive drugs, subsequent encounter: Secondary | ICD-10-CM | POA: Diagnosis not present

## 2022-02-27 DIAGNOSIS — C9000 Multiple myeloma not having achieved remission: Secondary | ICD-10-CM | POA: Diagnosis not present

## 2022-02-27 DIAGNOSIS — Z9484 Stem cells transplant status: Secondary | ICD-10-CM | POA: Diagnosis not present

## 2022-02-27 DIAGNOSIS — R112 Nausea with vomiting, unspecified: Secondary | ICD-10-CM | POA: Diagnosis not present

## 2022-02-27 DIAGNOSIS — D61810 Antineoplastic chemotherapy induced pancytopenia: Secondary | ICD-10-CM | POA: Diagnosis not present

## 2022-02-27 DIAGNOSIS — R197 Diarrhea, unspecified: Secondary | ICD-10-CM | POA: Diagnosis not present

## 2022-02-27 DIAGNOSIS — Z79899 Other long term (current) drug therapy: Secondary | ICD-10-CM | POA: Diagnosis not present

## 2022-02-27 DIAGNOSIS — Z79624 Long term (current) use of inhibitors of nucleotide synthesis: Secondary | ICD-10-CM | POA: Diagnosis not present

## 2022-02-28 DIAGNOSIS — Z9484 Stem cells transplant status: Secondary | ICD-10-CM | POA: Diagnosis not present

## 2022-02-28 DIAGNOSIS — K2970 Gastritis, unspecified, without bleeding: Secondary | ICD-10-CM | POA: Diagnosis not present

## 2022-02-28 DIAGNOSIS — C9000 Multiple myeloma not having achieved remission: Secondary | ICD-10-CM | POA: Diagnosis not present

## 2022-02-28 DIAGNOSIS — Z452 Encounter for adjustment and management of vascular access device: Secondary | ICD-10-CM | POA: Diagnosis not present

## 2022-02-28 DIAGNOSIS — Z79899 Other long term (current) drug therapy: Secondary | ICD-10-CM | POA: Diagnosis not present

## 2022-02-28 DIAGNOSIS — C9001 Multiple myeloma in remission: Secondary | ICD-10-CM | POA: Diagnosis not present

## 2022-03-01 DIAGNOSIS — R7989 Other specified abnormal findings of blood chemistry: Secondary | ICD-10-CM | POA: Diagnosis not present

## 2022-03-01 DIAGNOSIS — C9001 Multiple myeloma in remission: Secondary | ICD-10-CM | POA: Diagnosis not present

## 2022-03-08 ENCOUNTER — Other Ambulatory Visit: Payer: Medicare Other

## 2022-03-08 ENCOUNTER — Ambulatory Visit: Payer: Medicare Other

## 2022-03-08 ENCOUNTER — Ambulatory Visit: Payer: Medicare Other | Admitting: Oncology

## 2022-03-08 DIAGNOSIS — Z52011 Autologous donor, stem cells: Secondary | ICD-10-CM | POA: Diagnosis not present

## 2022-03-08 DIAGNOSIS — C9001 Multiple myeloma in remission: Secondary | ICD-10-CM | POA: Diagnosis not present

## 2022-03-08 DIAGNOSIS — R7989 Other specified abnormal findings of blood chemistry: Secondary | ICD-10-CM | POA: Diagnosis not present

## 2022-03-17 ENCOUNTER — Telehealth: Payer: Self-pay

## 2022-03-17 DIAGNOSIS — C9001 Multiple myeloma in remission: Secondary | ICD-10-CM | POA: Diagnosis not present

## 2022-03-17 DIAGNOSIS — C9000 Multiple myeloma not having achieved remission: Secondary | ICD-10-CM | POA: Diagnosis not present

## 2022-03-17 DIAGNOSIS — Z79899 Other long term (current) drug therapy: Secondary | ICD-10-CM | POA: Diagnosis not present

## 2022-03-17 DIAGNOSIS — K089 Disorder of teeth and supporting structures, unspecified: Secondary | ICD-10-CM | POA: Diagnosis not present

## 2022-03-17 DIAGNOSIS — Z9484 Stem cells transplant status: Secondary | ICD-10-CM | POA: Diagnosis not present

## 2022-03-17 DIAGNOSIS — Z79624 Long term (current) use of inhibitors of nucleotide synthesis: Secondary | ICD-10-CM | POA: Diagnosis not present

## 2022-03-17 DIAGNOSIS — M069 Rheumatoid arthritis, unspecified: Secondary | ICD-10-CM | POA: Diagnosis not present

## 2022-03-17 DIAGNOSIS — Z792 Long term (current) use of antibiotics: Secondary | ICD-10-CM | POA: Diagnosis not present

## 2022-03-17 DIAGNOSIS — I509 Heart failure, unspecified: Secondary | ICD-10-CM | POA: Diagnosis not present

## 2022-03-17 DIAGNOSIS — Z52011 Autologous donor, stem cells: Secondary | ICD-10-CM | POA: Diagnosis not present

## 2022-03-17 NOTE — Telephone Encounter (Signed)
-----   Message from Earlie Server, MD sent at 03/17/2022  2:26 PM EST ----- Regarding: RE: [place] Yes, please set up appt in early Jan , lab MD cbc cmp hold tube,. Thanks.  ----- Message ----- From: Evelina Dun, RN Sent: 03/01/2022   4:49 PM EST To: Earlie Server, MD Subject: FW: [place] [facility]      Do we need to set up appt?  ----- Message ----- From: Secundino Ginger Sent: 03/01/2022   3:37 PM EST To: Evelina Dun, RN; Oneida Arenas, CMA Subject: [place] [facility] sent to his chart.

## 2022-03-17 NOTE — Telephone Encounter (Signed)
Discussed with Dr. Tasia Catchings. Will keep appointment on 1/17. Pt informed. Will mail appt reminder as well.

## 2022-03-17 NOTE — Telephone Encounter (Signed)
Please move up appt to early Jan (lab/MD) and inform pt of appt.

## 2022-03-17 NOTE — Telephone Encounter (Signed)
Called pt to discuss about moving appt up per Dr. Tasia Catchings and he would like to keep on 1/17 as that is what he was told at [facility], but is willing to move up if that is what you want.   Spoke to [employer], Therapist, sports at [facility] and she states that they recommend follow up at Day 60 after transplant (which would be 1/17), but they are ok with you moving it up if needed.   Please advise.

## 2022-03-20 ENCOUNTER — Encounter: Payer: Self-pay | Admitting: Oncology

## 2022-04-06 ENCOUNTER — Ambulatory Visit: Payer: Medicare Other | Admitting: Oncology

## 2022-04-06 ENCOUNTER — Other Ambulatory Visit: Payer: Medicare Other

## 2022-04-19 ENCOUNTER — Other Ambulatory Visit: Payer: Medicare Other

## 2022-04-19 ENCOUNTER — Encounter: Payer: Self-pay | Admitting: Oncology

## 2022-04-19 ENCOUNTER — Inpatient Hospital Stay: Payer: Medicare Other | Attending: Oncology

## 2022-04-19 ENCOUNTER — Ambulatory Visit: Payer: Medicare Other | Admitting: Oncology

## 2022-04-19 ENCOUNTER — Inpatient Hospital Stay: Payer: Medicare Other | Admitting: Oncology

## 2022-04-19 VITALS — BP 130/90 | HR 90 | Temp 97.3°F | Resp 18 | Wt 293.1 lb

## 2022-04-19 DIAGNOSIS — I10 Essential (primary) hypertension: Secondary | ICD-10-CM | POA: Insufficient documentation

## 2022-04-19 DIAGNOSIS — G629 Polyneuropathy, unspecified: Secondary | ICD-10-CM

## 2022-04-19 DIAGNOSIS — C9000 Multiple myeloma not having achieved remission: Secondary | ICD-10-CM

## 2022-04-19 DIAGNOSIS — M069 Rheumatoid arthritis, unspecified: Secondary | ICD-10-CM | POA: Insufficient documentation

## 2022-04-19 DIAGNOSIS — D649 Anemia, unspecified: Secondary | ICD-10-CM | POA: Insufficient documentation

## 2022-04-19 DIAGNOSIS — Z79899 Other long term (current) drug therapy: Secondary | ICD-10-CM | POA: Insufficient documentation

## 2022-04-19 DIAGNOSIS — Z7961 Long term (current) use of immunomodulator: Secondary | ICD-10-CM | POA: Insufficient documentation

## 2022-04-19 DIAGNOSIS — Z9484 Stem cells transplant status: Secondary | ICD-10-CM | POA: Insufficient documentation

## 2022-04-19 DIAGNOSIS — Z79624 Long term (current) use of inhibitors of nucleotide synthesis: Secondary | ICD-10-CM | POA: Diagnosis not present

## 2022-04-19 DIAGNOSIS — Z7982 Long term (current) use of aspirin: Secondary | ICD-10-CM | POA: Insufficient documentation

## 2022-04-19 LAB — COMPREHENSIVE METABOLIC PANEL
ALT: 8 U/L (ref 0–44)
AST: 16 U/L (ref 15–41)
Albumin: 4.2 g/dL (ref 3.5–5.0)
Alkaline Phosphatase: 76 U/L (ref 38–126)
Anion gap: 8 (ref 5–15)
BUN: 22 mg/dL (ref 8–23)
CO2: 23 mmol/L (ref 22–32)
Calcium: 9 mg/dL (ref 8.9–10.3)
Chloride: 104 mmol/L (ref 98–111)
Creatinine, Ser: 0.9 mg/dL (ref 0.61–1.24)
GFR, Estimated: >60 mL/min (ref >60)
Glucose, Bld: 105 mg/dL — ABNORMAL HIGH (ref 70–99)
Potassium: 4 mmol/L (ref 3.5–5.1)
Sodium: 135 mmol/L (ref 135–145)
Total Bilirubin: 0.3 mg/dL (ref 0.3–1.2)
Total Protein: 7.1 g/dL (ref 6.5–8.1)

## 2022-04-19 LAB — CBC WITH DIFFERENTIAL/PLATELET
Abs Immature Granulocytes: 0.02 10*3/uL (ref 0.00–0.07)
Basophils Absolute: 0 10*3/uL (ref 0.0–0.1)
Basophils Relative: 0 %
Eosinophils Absolute: 0.1 10*3/uL (ref 0.0–0.5)
Eosinophils Relative: 1 %
HCT: 39.7 % (ref 39.0–52.0)
Hemoglobin: 12.8 g/dL — ABNORMAL LOW (ref 13.0–17.0)
Immature Granulocytes: 0 %
Lymphocytes Relative: 14 %
Lymphs Abs: 1.2 10*3/uL (ref 0.7–4.0)
MCH: 30 pg (ref 26.0–34.0)
MCHC: 32.2 g/dL (ref 30.0–36.0)
MCV: 93.2 fL (ref 80.0–100.0)
Monocytes Absolute: 0.6 10*3/uL (ref 0.1–1.0)
Monocytes Relative: 7 %
Neutro Abs: 7 10*3/uL (ref 1.7–7.7)
Neutrophils Relative %: 78 %
Platelets: 224 10*3/uL (ref 150–400)
RBC: 4.26 MIL/uL (ref 4.22–5.81)
RDW: 17.1 % — ABNORMAL HIGH (ref 11.5–15.5)
WBC: 9 10*3/uL (ref 4.0–10.5)
nRBC: 0 % (ref 0.0–0.2)

## 2022-04-19 NOTE — Assessment & Plan Note (Addendum)
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 Stable counts. [place] myeloma/bone marrow transplant notes were reviewed. 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 Bactrim DS 1 tab M/W/F for PCP.  Plan day 100 bone marrow biopsy-05/30/2022   Maintenance regimen depending on bone marrow biopsy results  Awaiting dental clearance to start bone strengthening agent-Xgeva

## 2022-04-19 NOTE — Progress Notes (Signed)
Hematology/Oncology Progress note [phone] [phone]      Patient Care Team: Seward Carol, MD as PCP - General (Internal Medicine) Burnell Blanks, MD as PCP - Cardiology (Cardiology)  ASSESSMENT & PLAN:   Cancer Staging  Multiple myeloma not having achieved remission [facility]) Staging form: Plasma Cell Myeloma and Plasma Cell Disorders, AJCC 8th Edition - Clinical stage from 04/22/2020: RISS Stage II (Beta-2-microglobulin (mg/L): 2.7, Albumin (g/dL): 3.1, ISS: Stage II, High-risk cytogenetics: Absent, LDH: Normal) - Signed by Earlie Server, MD on 08/10/2021   Multiple myeloma not having achieved remission (Eric Cruz) IgA lambda multiple myeloma, M protein 2.3, normal free light chain ratio Status post Dara Rvd, achieved CR, 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 Stable counts. [place] myeloma/bone marrow transplant notes were reviewed. 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 Bactrim DS 1 tab M/W/F for PCP.  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  Monitor symptoms-stable   Normocytic anemia Stable hemoglobin  Rheumatoid arthritis ([place]) follow up with rheumatology.  Currently he is off Humira and methotrexate.  Symptom has been stable.   No orders of the defined types were placed in this encounter.   Follow up March 2024.    All questions were answered. The patient knows to call the clinic with any problems, questions or concerns.  Earlie Server, MD, PhD [facility] Hematology Oncology 04/19/2022    CHIEF COMPLAINTS/REASON FOR VISIT:  Follow-up for multiple myeloma  HISTORY OF PRESENTING ILLNESS:  Eric Cruz is a 72 y.o.  male presents for follow up of myeloma .  Oncology History  Multiple myeloma not having achieved remission ([place])  04/22/2020 Initial Diagnosis   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  27% plasma cell, cytogenetics - normal, and myeloma FISH panel is negative.  Standard risk.  [place]ongo red staining was added and was negative.  - 07/27/2021, bone marrow biopsy showed hypercellular marrow involved by plasma cell myeloma, CD138 immunohistochemistry plasma cells compromise approximately 70% of the cellular elements and are lambda restricted by light chain in situ hybridization.  Cytogenetics showed duplication of 1q.  Cytogenetics is normal.   04/22/2020 Cancer Staging   Staging form: Plasma Cell Myeloma and Plasma Cell Disorders, AJCC 8th Edition - Clinical stage from 04/22/2020: RISS Stage II (Beta-2-microglobulin (mg/L): 2.7, Albumin (g/dL): 3.1, ISS: Stage II, High-risk cytogenetics: Absent, LDH: Normal) - Signed by Earlie Server, MD on 08/10/2021 Stage prefix: Initial diagnosis Beta 2 microglobulin range (mg/L): Less than 3.5 Albumin range (g/dL): Less than 3.5 Cytogenetics: 1q addition Lactate dehydrogenase (LDH) (U/L): 136 Serum calcium level: Normal Serum creatinine level: Normal   08/12/2021 Imaging   PET scan showed 1. No evidence of active myeloma on skull base to thigh FDG PET scan. 2. No soft tissue plasmacytoma.3. No lytic or suspicious lesion on CT portion exam.     08/22/2021 -  Chemotherapy   Patient is on Treatment Plan : MYELOMA NEWLY DIAGNOSED TRANSPLANT CANDIDATE DaraVRd (Daratumumab SQ) q21d x 6 Cycles (Induction/Consolidation)     08/24/2021 - 11/09/2021 Chemotherapy   Patient is on Treatment Plan : MYELOMA NEWLY DIAGNOSED TRANSPLANT CANDIDATE DaraVRd (Daratumumab SQ) q21d x 6 Cycles (Induction/Consolidation)  11/02/2021 Imaging   Bilateral lower extremity US negative for DVT   11/15/2021  Echocardiogram   1. Left ventricular ejection fraction, by estimation, is 60 to 65%. The left ventricle has normal function. The left ventricle has no regional wall motion abnormalities. Left ventricular diastolic parameters are consistent with Grade II diastolic dysfunction (pseudonormalization). The average left ventricular global longitudinal strain is -17.4 %.   2. Right ventricular systolic function is normal. The right ventricular size is normal. There is mildly elevated pulmonary artery systolic pressure. The estimated right ventricular systolic pressure is 67.3 mmHg.   3. The mitral valve is normal in structure. Mild mitral valve regurgitation. No evidence of mitral stenosis.  4. The aortic valve was not well visualized. Aortic valve regurgitation  is not visualized. No aortic stenosis is present.   5. The inferior vena cava is normal in size with greater than 50% respiratory variability, suggesting right atrial pressure of 3 mmHg.    01/23/2022 Bone Marrow Biopsy   Mildly hypercellular bone marrow (40%) with increased erythropoiesis and 2% plasma cells.    02/15/2022 Procedure   Status post autologous stem cell transplant at [place] Preparative regimen with Melphalan 200 mg/m2 on 02/14/22 Infusion of stem cells - 6.7 x 10(6) on 02/15/22     For rheumatoid arthritis, patient was seen by Dr. Estanislado Pandy recently and is currently off methotrexate and Humira.  + Bilateral lower extremity swelling.[place] negative for DVT  INTERVAL HISTORY Eric Cruz is a 72 y.o. male who has above history reviewed by me today presents for follow up visit for management of multiple myeloma 02/15/2022, status post autologous stem cell transplantation.  Today is day 60 He reports feeling well.  Noticed some new color changes.  No fever, chills, bleeding events, abdominal pain, nausea vomiting diarrhea.    Review of Systems  Constitutional:  Negative for appetite change, chills,  fatigue, fever and unexpected weight change.  HENT:   Negative for hearing loss and voice change.   Eyes:  Negative for eye problems and icterus.  Respiratory:  Negative for chest tightness, cough and shortness of breath.   Cardiovascular:  Negative for chest pain and leg swelling.  Gastrointestinal:  Negative for abdominal distention and abdominal pain.  Endocrine: Negative for hot flashes.  Genitourinary:  Negative for difficulty urinating, dysuria and frequency.   Musculoskeletal:  Positive for arthralgias and back pain.  Skin:  Negative for itching and rash.  Neurological:  Negative for light-headedness and numbness.  Hematological:  Negative for adenopathy. Does not bruise/bleed easily.  Psychiatric/Behavioral:  Negative for confusion.      MEDICAL HISTORY:  Past Medical History:  Diagnosis Date   Arthritis    BPH (benign prostatic hyperplasia)    Cancer (HCC)    Prostate: states he had a positive biopsy but had another one a year later and it was gone.    Depression    Dizziness    GERD (gastroesophageal reflux disease)    Gout    High cholesterol    Hypertension    Sleep apnea    has cpap - not currently wearing   Varicose veins     SURGICAL HISTORY: Past Surgical History:  Procedure Laterality Date   ANTERIOR CERVICAL DECOMP/DISCECTOMY FUSION N/A 02/21/2018   Procedure: Cervical Five-Six Cervical Six-Seven Anterior cervical decompression/discectomy/fusion;  Surgeon: Ashok Pall, MD;  Location: [place];  Service: Neurosurgery;  Laterality: N/A;  Cervical Five-Six Cervical Six-Seven Anterior cervical decompression/discectomy/fusion   BIOPSY  10/09/2019  Procedure: BIOPSY;  Surgeon: Ronnette Juniper, MD;  Location: [facility];  Service: Gastroenterology;;   BLADDER SURGERY     CARPAL TUNNEL RELEASE Right 02/11/2019   Procedure: Right Carpal Tunnel Wound Irrigation;  Surgeon: Ashok Pall, MD;  Location: [place];  Service: Neurosurgery;  Laterality: Right;  Right Carpal  tunnel wound exploration/wash out   CARPAL TUNNEL RELEASE Bilateral    CHOLECYSTECTOMY  11/22/2012   CHOLECYSTECTOMY  11/22/2012   Procedure: LAPAROSCOPIC CHOLECYSTECTOMY;  Surgeon: Gwenyth Ober, MD;  Location: [place];  Service: General;;   COLONOSCOPY     COLONOSCOPY WITH PROPOFOL N/A 10/09/2019   Procedure: COLONOSCOPY WITH PROPOFOL;  Surgeon: Ronnette Juniper, MD;  Location: [facility];  Service: Gastroenterology;  Laterality: N/A;   ESOPHAGOGASTRODUODENOSCOPY (EGD) WITH PROPOFOL N/A 10/09/2019   Procedure: ESOPHAGOGASTRODUODENOSCOPY (EGD) WITH PROPOFOL;  Surgeon: Ronnette Juniper, MD;  Location: [facility];  Service: Gastroenterology;  Laterality: N/A;   POLYPECTOMY  10/09/2019   Procedure: POLYPECTOMY;  Surgeon: Ronnette Juniper, MD;  Location: [facility];  Service: Gastroenterology;;    SOCIAL HISTORY: Social History   Socioeconomic History   Marital status: Married    Spouse name: Not on file   Number of children: 2   Years of education: Not on file   Highest education level: Not on file  Occupational History   Occupation: Retired-mechanic  Tobacco Use   Smoking status: Former    Packs/day: 1.00    Years: 31.00    Total pack years: 31.00    Types: Cigarettes    Quit date: 04/12/1998    Years since quitting: 24.0    Passive exposure: Never   Smokeless tobacco: Never  Vaping Use   Vaping Use: Never used  Substance and Sexual Activity   Alcohol use: No   Drug use: No   Sexual activity: Not on file  Other Topics Concern   Not on file  Social History Narrative   Not on file   Social Determinants of Health   Financial Resource Strain: Not on file  Food Insecurity: Not on file  Transportation Needs: Not on file  Physical Activity: Not on file  Stress: Not on file  Social Connections: Not on file  Intimate Partner Violence: Not on file    FAMILY HISTORY: Family History  Problem Relation Age of Onset   Hypertension Mother    Diabetes Mother    Multiple myeloma Mother     Hypertension Father    Diabetes Father    Healthy Son    Healthy Daughter     ALLERGIES:  is allergic to atorvastatin, simvastatin, other, and statins.  MEDICATIONS:  Current Outpatient Medications  Medication Sig Dispense Refill   acyclovir (ZOVIRAX) 400 MG tablet Take 800 mg by mouth 2 (two) times daily.     finasteride (PROSCAR) 5 MG tablet Take 5 mg by mouth daily.     folic acid (FOLVITE) 1 MG tablet TAKE 2 TABLETS BY MOUTH DAILY 180 tablet 3   furosemide (LASIX) 40 MG tablet Take 1 tablet (40 mg total) by mouth daily. 90 tablet 3   Multiple Vitamin (MULTIVITAMIN ADULT PO) Take by mouth.     tamsulosin (FLOMAX) 0.4 MG CAPS capsule Take 0.4 mg by mouth daily after supper.     Adalimumab (HUMIRA PEN) 40 MG/0.4ML PNKT Inject 40 mg into the skin every 14 (fourteen) days. (Patient not taking: Reported on 12/09/2021) 2 each 2   Dextromethorphan-guaiFENesin (MUCUS RELIEF DM PO) Take by mouth as needed. (Patient not taking: Reported on  04/19/2022)     EQ ASPIRIN ADULT LOW DOSE 81 MG tablet Take 81 mg by mouth daily. (Patient not taking: Reported on 04/19/2022)     ferrous sulfate 325 (65 FE) MG EC tablet Take 325 mg by mouth daily with breakfast. (Patient not taking: Reported on 04/19/2022)     furosemide (LASIX) 40 MG tablet Take 1 tablet by mouth daily. (Patient not taking: Reported on 01/18/2022)     hydrocortisone cream 0.5 % Apply 1 Application topically 3 (three) times daily. Apply small amount to affected area 3 times daily until healed (Patient not taking: Reported on 04/19/2022) 56 g 1   lenalidomide (REVLIMID) 10 MG capsule Take 1 capsule (10 mg total) by mouth daily. Take for 21 days, then hold for 7 days. Repeat every 28 days. (Patient not taking: Reported on 04/19/2022) 21 capsule 0   methotrexate (RHEUMATREX) 2.5 MG tablet TAKE 8 TABLETS BY MOUTH ONCE  WEEKLY . CAUTION: CHEMOTHERAPY.  PROTECTION FROM LIGHT (Patient not taking: Reported on 12/21/2021) 96 tablet 0   montelukast (SINGULAIR)  10 MG tablet TAKE 1 TABLET BY MOUTH ON THE  DAY PRIOR TO DARATUMUMAB  TREATMENT AND 1 TABLET BY MOUTH  DAILY FOR 2 DAYS AFTER TREATMENT (Patient not taking: Reported on 04/19/2022) 30 tablet 4   mupirocin cream (BACTROBAN) 2 % Apply 1 Application topically 2 (two) times daily. (Patient not taking: Reported on 04/19/2022) 30 g 0   sulfamethoxazole-trimethoprim (BACTRIM DS) 800-160 MG tablet Take 1 tablet by mouth 3 (three) times a week. (Patient not taking: Reported on 04/19/2022)     Thiamine HCl (VITAMIN B-1) 250 MG tablet Take 1 tablet by mouth daily. (Patient not taking: Reported on 04/19/2022)     vitamin B-12 (CYANOCOBALAMIN) 100 MCG tablet Take 100 mcg by mouth daily. (Patient not taking: Reported on 04/19/2022)     Vitamin D-Vitamin K (K2-D3 MAX PO) Take by mouth. (Patient not taking: Reported on 04/19/2022)     No current facility-administered medications for this visit.   Facility-Administered Medications Ordered in Other Visits  Medication Dose Route Frequency Provider Last Rate Last Admin   acetaminophen (TYLENOL) 325 MG tablet            dexamethasone (DECADRON) 4 MG tablet            diphenhydrAMINE (BENADRYL) 25 mg capsule              PHYSICAL EXAMINATION: ECOG PERFORMANCE STATUS: 1 - Symptomatic but completely ambulatory Vitals:   04/19/22 1329  BP: (!) 130/90  Pulse: 90  Resp: 18  Temp: (!) 97.3 F (36.3 C)   Filed Weights   04/19/22 1329  Weight: 293 lb 1.6 oz (132.9 kg)    Physical Exam Constitutional:      General: He is not in acute distress.    Appearance: He is obese.     Comments: Patient walks with a cane.  HENT:     Head: Normocephalic and atraumatic.  Eyes:     General: No scleral icterus. Cardiovascular:     Rate and Rhythm: Normal rate and regular rhythm.     Heart sounds: Normal heart sounds.  Pulmonary:     Effort: Pulmonary effort is normal. No respiratory distress.     Breath sounds: No wheezing.  Abdominal:     General: Bowel sounds are  normal. There is no distension.     Palpations: Abdomen is soft.  Musculoskeletal:        General: No deformity. Normal range of  motion.     Cervical back: Normal range of motion and neck supple.     Comments: Trace edema, bilateral low extremities.   Skin:    General: Skin is warm and dry.     Findings: No erythema or rash.  Neurological:     Mental Status: He is alert and oriented to person, place, and time. Mental status is at baseline.     Cranial Nerves: No cranial nerve deficit.     Coordination: Coordination normal.  Psychiatric:        Mood and Affect: Mood normal.      LABORATORY DATA:  I have reviewed the data as listed Lab Results  Component Value Date   WBC 9.0 04/19/2022   HGB 12.8 (L) 04/19/2022   HCT 39.7 04/19/2022   MCV 93.2 04/19/2022   PLT 224 04/19/2022   Recent Labs    01/04/22 0819 01/11/22 0838 04/19/22 1301  NA 137 137 135  K 3.8 4.0 4.0  CL 109 108 104  CO2 '24 23 23  '$ GLUCOSE 92 102* 105*  BUN '13 15 22  '$ CREATININE 1.02 0.97 0.90  CALCIUM 8.5* 8.8* 9.0  GFRNONAA >60 >60 >60  PROT 6.3* 6.8 7.1  ALBUMIN 3.6 3.6 4.2  AST '16 18 16  '$ ALT '7 7 8  '$ ALKPHOS 69 70 76  BILITOT 0.3 0.3 0.3    Iron/TIBC/Ferritin/ %Sat    Component Value Date/Time   IRON 78 04/05/2021 1008   TIBC 274 04/05/2021 1008   FERRITIN 425 (H) 04/05/2021 1008   IRONPCTSAT 28 04/05/2021 1008      RADIOGRAPHIC STUDIES: I have personally reviewed the radiological images as listed and agreed with the findings in the report.  No results found.

## 2022-04-19 NOTE — Assessment & Plan Note (Signed)
secondary to carpal tunnel disease and chemotherapy Patient did not tolerate gabapentin.  Monitor symptoms-stable

## 2022-04-19 NOTE — Assessment & Plan Note (Signed)
Stable hemoglobin.

## 2022-04-19 NOTE — Progress Notes (Signed)
Pt here for follow up. Pt reports that the bottom of nail beds are brown and the white part of his eye has a greenish tint. Pt reports that he did not have this prior to transplant. Pt reports denies pain to fingers or eyes. No vision changes.   Pt requesting a refill

## 2022-04-19 NOTE — Assessment & Plan Note (Signed)
follow up with rheumatology.  Currently he is off Humira and methotrexate.  Symptom has been stable.

## 2022-04-20 ENCOUNTER — Encounter: Payer: Self-pay | Admitting: Oncology

## 2022-04-20 ENCOUNTER — Telehealth: Payer: Self-pay

## 2022-04-20 NOTE — Telephone Encounter (Signed)
Zometa will need be scheduled, per insurance preference.

## 2022-04-20 NOTE — Addendum Note (Signed)
Addended by: Earlie Server on: 04/20/2022 09:38 AM   Modules accepted: Orders

## 2022-04-20 NOTE — Telephone Encounter (Signed)
Per LOS on 1/17, pt will need Xgeva once dental clearance is obtained.   Dental clearance has been faxed.

## 2022-04-22 LAB — SAMPLE TO BLOOD BANK

## 2022-04-26 ENCOUNTER — Encounter: Payer: Self-pay | Admitting: Oncology

## 2022-04-26 NOTE — Telephone Encounter (Signed)
dental clearance received and denied.   Per note: patient needs appt for dental evan s/p tranplant. Befoer starting zometa, most likely needs extraction at tooth #7.  Patient scheduled for clinic visit for May 04, 2019.   Note available in Media.

## 2022-05-01 ENCOUNTER — Encounter: Payer: Self-pay | Admitting: Licensed Clinical Social Worker

## 2022-05-01 NOTE — Progress Notes (Addendum)
[place] CSW Progress Note  Clinical Education officer, museum contacted patient by phone to discuss financial assistance.  Patient stated oncologist referred to [facility] for treatment and has been receiving financial help to assist with treatment from [facility].  Patient stated treatment is about 12,000, and he was told by a representative from [facility], to request [facility] and [place] share the cost of the last treatment. CSW informed patient about [place] assistance stated I was not aware of financial assistance at [facility] for the needed amount, but I would request information and call him back when I received information.    Adelene Amas, LCSW

## 2022-05-03 DIAGNOSIS — K0530 Chronic periodontitis, unspecified: Secondary | ICD-10-CM | POA: Diagnosis not present

## 2022-05-03 DIAGNOSIS — K0889 Other specified disorders of teeth and supporting structures: Secondary | ICD-10-CM | POA: Diagnosis not present

## 2022-05-03 DIAGNOSIS — K036 Deposits [accretions] on teeth: Secondary | ICD-10-CM | POA: Diagnosis not present

## 2022-05-03 DIAGNOSIS — K063 Horizontal alveolar bone loss: Secondary | ICD-10-CM | POA: Diagnosis not present

## 2022-05-03 DIAGNOSIS — K08409 Partial loss of teeth, unspecified cause, unspecified class: Secondary | ICD-10-CM | POA: Diagnosis not present

## 2022-05-03 DIAGNOSIS — C9000 Multiple myeloma not having achieved remission: Secondary | ICD-10-CM | POA: Diagnosis not present

## 2022-05-03 DIAGNOSIS — Z9484 Stem cells transplant status: Secondary | ICD-10-CM | POA: Diagnosis not present

## 2022-05-05 ENCOUNTER — Encounter: Payer: Self-pay | Admitting: Licensed Clinical Social Worker

## 2022-05-05 NOTE — Progress Notes (Signed)
[place] CSW Progress Note  Clinical Social Worker  left voicemail   to request information to assist with financial question.  CSW left voicemail with contact information and request for a return call.    Adelene Amas, LCSW

## 2022-05-30 DIAGNOSIS — Z52011 Autologous donor, stem cells: Secondary | ICD-10-CM | POA: Diagnosis not present

## 2022-05-30 DIAGNOSIS — Z9484 Stem cells transplant status: Secondary | ICD-10-CM | POA: Diagnosis not present

## 2022-05-30 DIAGNOSIS — Z23 Encounter for immunization: Secondary | ICD-10-CM | POA: Diagnosis not present

## 2022-05-30 DIAGNOSIS — C9001 Multiple myeloma in remission: Secondary | ICD-10-CM | POA: Diagnosis not present

## 2022-05-30 DIAGNOSIS — Z79899 Other long term (current) drug therapy: Secondary | ICD-10-CM | POA: Diagnosis not present

## 2022-05-30 DIAGNOSIS — C9000 Multiple myeloma not having achieved remission: Secondary | ICD-10-CM | POA: Diagnosis not present

## 2022-06-01 ENCOUNTER — Encounter: Payer: Self-pay | Admitting: Radiology

## 2022-06-13 DIAGNOSIS — Z9484 Stem cells transplant status: Secondary | ICD-10-CM | POA: Diagnosis not present

## 2022-06-13 DIAGNOSIS — C9000 Multiple myeloma not having achieved remission: Secondary | ICD-10-CM | POA: Diagnosis not present

## 2022-06-13 DIAGNOSIS — C9001 Multiple myeloma in remission: Secondary | ICD-10-CM | POA: Diagnosis not present

## 2022-06-20 ENCOUNTER — Inpatient Hospital Stay: Payer: Medicare Other

## 2022-06-20 ENCOUNTER — Inpatient Hospital Stay: Payer: Medicare Other | Admitting: Oncology

## 2022-06-20 DIAGNOSIS — C9001 Multiple myeloma in remission: Secondary | ICD-10-CM | POA: Diagnosis not present

## 2022-06-28 ENCOUNTER — Other Ambulatory Visit: Payer: Self-pay | Admitting: Pharmacist

## 2022-06-28 ENCOUNTER — Telehealth: Payer: Self-pay

## 2022-06-28 ENCOUNTER — Other Ambulatory Visit: Payer: Self-pay | Admitting: Oncology

## 2022-06-28 ENCOUNTER — Other Ambulatory Visit (HOSPITAL_COMMUNITY): Payer: Self-pay

## 2022-06-28 DIAGNOSIS — C9000 Multiple myeloma not having achieved remission: Secondary | ICD-10-CM

## 2022-06-28 MED ORDER — LENALIDOMIDE 10 MG PO CAPS: 10.0000 mg | ORAL_CAPSULE | Freq: Every day | ORAL | 0 refills | Status: DC

## 2022-06-28 NOTE — Telephone Encounter (Signed)
Oral Oncology Patient Advocate Encounter  Was successful in securing patient a $12,000 grant from [facility] to provide copayment coverage for Lenalidomide.  This will keep the out of pocket expense at $0.     [employer] ID: [id number]   The billing information is as follows and has been shared with [facility].    RxBin: [id number] PCN: [id number] Member ID: [id number] Group ID: [id number] Dates of Eligibility: 07/13/22 through 07/12/23  Fund:  Multiple Myeloma - Medicare [place] [facility] Patient [place]  [phone] (phone) [phone] (fax) 06/28/2022 9:15 AM

## 2022-06-28 NOTE — Telephone Encounter (Signed)
-----   Message from Darl Pikes, [place] sent at 06/28/2022 11:07 AM EDT ----- Regarding: RE: CONSULT NOTES- [facility] His Revlimid Rx has been sent to [facility], they should be able to get medication to him in about a week. If you want to go head and schedule his appt for a week out.   Clearnce Sorrel  ----- Message ----- From: Earlie Server, MD Sent: 06/28/2022   8:56 AM EDT To: Evelina Dun, RN; # Subject: RE: CONSULT NOTES- [facility]               I plan to start him back on maintenance with revlimid 10 mg on days 1-21 of 28 day cycle Please move his appt up to see me when he gets the supply. He should get labs Md same day cbc cmp myeloma labs, then to start maintenance.  Thank you  ----- Message ----- From: Evelina Dun, RN Sent: 06/23/2022   3:07 PM EDT To: Earlie Server, MD Subject: FW: CONSULT NOTES- [facility]                ----- Message ----- From: Secundino Ginger Sent: 06/23/2022   7:53 AM EDT To: Evelina Dun, RN; Maia Breslow, CMA Subject: CONSULT NOTES- [facility]                   CONSULT NOTES- [facility] sent to his chart.

## 2022-06-28 NOTE — Telephone Encounter (Signed)
Called pt, no answer. Left  Voicemail message on pt's phone letting him know that he will be restarting Revlimid and that medication should get to him in about a week. Also added to message that he is not to start Revlimid until her see's Dr. Tasia Catchings next week.   Please schedule and call pt with appt:   Lab/MD in approx 1 week (cbc,cmp, MM, Lch)

## 2022-06-28 NOTE — Telephone Encounter (Signed)
Lenalidomide sent to [facility] along with all supporting documentation for fulfillment. Patient is aware and knows to expect a call from [facility] to setup delivery.    Berdine Addison, [place] [facility] Patient [place]  [phone] (phone) [phone] (fax) 06/28/2022 11:12 AM

## 2022-06-28 NOTE — Progress Notes (Signed)
Per Dr. Tasia Catchings, patient to resume lenalidomide for maintenance treatment. Rx sent to [facility]. Called patient to make him aware and to make sure he knew [facility] would be reaching out. He knows not to start until after he sees Dr. Tasia Catchings in clinic.

## 2022-06-28 NOTE — Telephone Encounter (Addendum)
Oral Oncology Patient Advocate Encounter  After completing a benefits investigation, prior authorization for Lenalidomide is not required at this time through [place].  Patient's copay is $3,890.90.    Patient has active grant to bring co-pay to $0.  Eric Cruz, [place] Patient [place]  [phone] (phone) [phone] (fax) 06/28/2022 9:10 AM

## 2022-06-30 NOTE — Telephone Encounter (Signed)
Per [facility] medication will be delivered today 06/30/22.

## 2022-07-04 ENCOUNTER — Other Ambulatory Visit: Payer: Self-pay

## 2022-07-05 ENCOUNTER — Encounter: Payer: Self-pay | Admitting: Oncology

## 2022-07-06 ENCOUNTER — Inpatient Hospital Stay: Payer: Medicare Other | Admitting: Oncology

## 2022-07-06 ENCOUNTER — Encounter: Payer: Self-pay | Admitting: Oncology

## 2022-07-06 ENCOUNTER — Inpatient Hospital Stay: Payer: Medicare Other | Attending: Oncology

## 2022-07-06 VITALS — BP 130/75 | HR 81 | Temp 97.0°F | Resp 18 | Wt 297.2 lb

## 2022-07-06 DIAGNOSIS — Z5111 Encounter for antineoplastic chemotherapy: Secondary | ICD-10-CM | POA: Diagnosis not present

## 2022-07-06 DIAGNOSIS — D649 Anemia, unspecified: Secondary | ICD-10-CM

## 2022-07-06 DIAGNOSIS — Z79624 Long term (current) use of inhibitors of nucleotide synthesis: Secondary | ICD-10-CM | POA: Diagnosis not present

## 2022-07-06 DIAGNOSIS — G4730 Sleep apnea, unspecified: Secondary | ICD-10-CM | POA: Diagnosis not present

## 2022-07-06 DIAGNOSIS — N400 Enlarged prostate without lower urinary tract symptoms: Secondary | ICD-10-CM | POA: Insufficient documentation

## 2022-07-06 DIAGNOSIS — Z9481 Bone marrow transplant status: Secondary | ICD-10-CM | POA: Insufficient documentation

## 2022-07-06 DIAGNOSIS — G629 Polyneuropathy, unspecified: Secondary | ICD-10-CM | POA: Diagnosis not present

## 2022-07-06 DIAGNOSIS — Z7982 Long term (current) use of aspirin: Secondary | ICD-10-CM | POA: Insufficient documentation

## 2022-07-06 DIAGNOSIS — I10 Essential (primary) hypertension: Secondary | ICD-10-CM | POA: Insufficient documentation

## 2022-07-06 DIAGNOSIS — Z87891 Personal history of nicotine dependence: Secondary | ICD-10-CM | POA: Diagnosis not present

## 2022-07-06 DIAGNOSIS — C9000 Multiple myeloma not having achieved remission: Secondary | ICD-10-CM

## 2022-07-06 DIAGNOSIS — Z9484 Stem cells transplant status: Secondary | ICD-10-CM | POA: Insufficient documentation

## 2022-07-06 DIAGNOSIS — Z7961 Long term (current) use of immunomodulator: Secondary | ICD-10-CM | POA: Diagnosis not present

## 2022-07-06 DIAGNOSIS — Z79899 Other long term (current) drug therapy: Secondary | ICD-10-CM | POA: Diagnosis not present

## 2022-07-06 DIAGNOSIS — E7800 Pure hypercholesterolemia, unspecified: Secondary | ICD-10-CM | POA: Insufficient documentation

## 2022-07-06 LAB — CBC WITH DIFFERENTIAL (CANCER CENTER ONLY)
Abs Immature Granulocytes: 0.04 10*3/uL (ref 0.00–0.07)
Basophils Absolute: 0 10*3/uL (ref 0.0–0.1)
Basophils Relative: 0 %
Eosinophils Absolute: 0.2 10*3/uL (ref 0.0–0.5)
Eosinophils Relative: 3 %
HCT: 39.3 % (ref 39.0–52.0)
Hemoglobin: 13 g/dL (ref 13.0–17.0)
Immature Granulocytes: 1 %
Lymphocytes Relative: 14 %
Lymphs Abs: 0.9 10*3/uL (ref 0.7–4.0)
MCH: 31.6 pg (ref 26.0–34.0)
MCHC: 33.1 g/dL (ref 30.0–36.0)
MCV: 95.4 fL (ref 80.0–100.0)
Monocytes Absolute: 0.4 10*3/uL (ref 0.1–1.0)
Monocytes Relative: 7 %
Neutro Abs: 4.9 10*3/uL (ref 1.7–7.7)
Neutrophils Relative %: 75 %
Platelet Count: 210 10*3/uL (ref 150–400)
RBC: 4.12 MIL/uL — ABNORMAL LOW (ref 4.22–5.81)
RDW: 14.1 % (ref 11.5–15.5)
WBC Count: 6.4 10*3/uL (ref 4.0–10.5)
nRBC: 0 % (ref 0.0–0.2)

## 2022-07-06 LAB — CMP (CANCER CENTER ONLY)
ALT: 8 U/L (ref 0–44)
AST: 18 U/L (ref 15–41)
Albumin: 4.5 g/dL (ref 3.5–5.0)
Alkaline Phosphatase: 97 U/L (ref 38–126)
Anion gap: 9 (ref 5–15)
BUN: 11 mg/dL (ref 8–23)
CO2: 23 mmol/L (ref 22–32)
Calcium: 9.1 mg/dL (ref 8.9–10.3)
Chloride: 104 mmol/L (ref 98–111)
Creatinine: 1.12 mg/dL (ref 0.61–1.24)
GFR, Estimated: >60 mL/min (ref >60)
Glucose, Bld: 105 mg/dL — ABNORMAL HIGH (ref 70–99)
Potassium: 3.7 mmol/L (ref 3.5–5.1)
Sodium: 136 mmol/L (ref 135–145)
Total Bilirubin: 0.3 mg/dL (ref 0.3–1.2)
Total Protein: 7.4 g/dL (ref 6.5–8.1)

## 2022-07-06 NOTE — Assessment & Plan Note (Signed)
secondary to carpal tunnel disease and chemotherapy Patient did not tolerate gabapentin.  Monitor symptoms-stable  

## 2022-07-06 NOTE — Progress Notes (Signed)
Hematology/Oncology Progress note [phone] [phone]      Patient Care Team: Seward Carol, MD as PCP - General (Internal Medicine) Burnell Blanks, MD as PCP - Cardiology (Cardiology)  ASSESSMENT & PLAN:   Cancer Staging  Multiple myeloma not having achieved remission Staging form: Plasma Cell Myeloma and Plasma Cell Disorders, AJCC 8th Edition - Clinical stage from 04/22/2020: RISS Stage II (Beta-2-microglobulin (mg/L): 2.7, Albumin (g/dL): 3.1, ISS: Stage II, High-risk cytogenetics: Absent, LDH: Normal) - Signed by Earlie Server, MD on 08/10/2021   Multiple myeloma not having achieved remission ([place]) IgA lambda multiple myeloma, M protein 2.3, normal free light chain ratio Status post Dara Rvd, achieved CR, 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 Stable counts. [place] myeloma/bone marrow transplant notes were reviewed. 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 MRD pending  maintenance with revlimid 10 mg on days 1-21 of 28 day cycle  vaccinations per protocol per on day 180 per [facility]  Awaiting dental clearance to start bone strengthening agent-Xgeva  - plan Q3 months to continue to 2 years of therapy.   Encounter for antineoplastic chemotherapy Chemotherapy plan as listed above.  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  Monitor symptoms-stable   Normocytic anemia Stable hemoglobin   Orders Placed This Encounter  Procedures   CBC with Differential ([place] Only)    Standing Status:   Future    Standing Expiration Date:   07/06/2023   CMP ([place] only)     Standing Status:   Future    Standing Expiration Date:   07/06/2023   Multiple Myeloma Panel (SPEP&IFE w/QIG)    Standing Status:   Future    Standing Expiration Date:   07/06/2023   Kappa/lambda light chains    Standing Status:   Future    Standing Expiration Date:   07/06/2023     Follow up in 3 weeks.  All questions were answered. The patient knows to call the clinic with any problems, questions or concerns.  Earlie Server, MD, PhD [facility] 07/06/2022    CHIEF COMPLAINTS/REASON FOR VISIT:  Follow-up for multiple myeloma  HISTORY OF PRESENTING ILLNESS:  Eric Cruz is a 72 y.o. male presents for follow up of myeloma .  Oncology History  Multiple myeloma not having achieved remission  04/22/2020 Initial Diagnosis   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  27% plasma cell, cytogenetics - normal, and myeloma FISH panel is negative.  Standard risk.  [place] red staining was added and was negative.  - 07/27/2021, bone marrow biopsy showed hypercellular marrow involved by plasma cell myeloma, CD138 immunohistochemistry plasma cells compromise approximately 70% of the cellular elements and are lambda restricted by light chain in situ hybridization.  Cytogenetics showed duplication of 1q.  Cytogenetics is normal.   04/22/2020 Cancer Staging   Staging form: Plasma Cell Myeloma and Plasma Cell Disorders, AJCC 8th Edition - Clinical stage from 04/22/2020: RISS Stage II (Beta-2-microglobulin (mg/L): 2.7, Albumin (g/dL): 3.1, ISS: Stage II, High-risk cytogenetics: Absent, LDH: Normal) - Signed by Earlie Server, MD on 08/10/2021 Stage prefix: Initial diagnosis  Beta 2 microglobulin range (mg/L): Less than 3.5 Albumin range (g/dL): Less than 3.5 Cytogenetics: 1q addition Lactate dehydrogenase (LDH) (U/L): 136 Serum calcium level: Normal Serum creatinine level: Normal   08/12/2021 Imaging    PET scan showed 1. No evidence of active myeloma on skull base to thigh FDG PET scan. 2. No soft tissue plasmacytoma.3. No lytic or suspicious lesion on CT portion exam.     08/22/2021 -  Chemotherapy   Patient is on Treatment Plan : MYELOMA NEWLY DIAGNOSED TRANSPLANT CANDIDATE DaraVRd (Daratumumab SQ) q21d x 6 Cycles (Induction/Consolidation)     08/24/2021 - 11/09/2021 Chemotherapy   Patient is on Treatment Plan : MYELOMA NEWLY DIAGNOSED TRANSPLANT CANDIDATE DaraVRd (Daratumumab SQ) q21d x 6 Cycles (Induction/Consolidation)     11/02/2021 Imaging   Bilateral lower extremity US negative for DVT   11/15/2021 Echocardiogram   1. Left ventricular ejection fraction, by estimation, is 60 to 65%. The left ventricle has normal function. The left ventricle has no regional wall motion abnormalities. Left ventricular diastolic parameters are consistent with Grade II diastolic dysfunction (pseudonormalization). The average left ventricular global longitudinal strain is -17.4 %.   2. Right ventricular systolic function is normal. The right ventricular size is normal. There is mildly elevated pulmonary artery systolic pressure. The estimated right ventricular systolic pressure is 0000000 mmHg.   3. The mitral valve is normal in structure. Mild mitral valve regurgitation. No evidence of mitral stenosis.  4. The aortic valve was not well visualized. Aortic valve regurgitation  is not visualized. No aortic stenosis is present.   5. The inferior vena cava is normal in size with greater than 50% respiratory variability, suggesting right atrial pressure of 3 mmHg.    01/23/2022 Bone Marrow Biopsy   Mildly hypercellular bone marrow (40%) with increased erythropoiesis and 2% plasma cells.    02/15/2022 Procedure   Status post autologous stem cell transplant at [facility] Preparative regimen with Melphalan 200 mg/m2 on 02/14/22 Infusion of stem cells - 6.7 x 10(6) on 02/15/22    06/13/2022  Procedure   Bone marrow biopsy at [facility] showed: Mildly hypercellular bone marrow (40%) with trilineage hematopoiesis and no increase in plasma cells. MRD pending    06/30/2022 -  Chemotherapy   Started on maintenance Revlimid 10mg  D1-21 Q28 days.     For rheumatoid arthritis, patient was seen by Dr. Estanislado Pandy recently and is currently off methotrexate and Humira.  + Bilateral lower extremity swelling.[place] negative for DVT  INTERVAL HISTORY Eric Cruz is a 72 y.o. male who has above history reviewed by me today presents for follow up visit for management of multiple myeloma Repeat bone marrow biopsy showed CR He reports feeling well. No fever, chills, bleeding events, abdominal pain, nausea vomiting diarrhea. Experiencing intermittent pain at the biopsy site, which occurs when standing for 5-6 minutes. He has got maintenance Revlimid, and despite being told he needs to hold off starting medication until he sees me, he has started on 06/30/2022. He tolerates well.    Review of Systems  Constitutional:  Negative for appetite change, chills, fatigue, fever and unexpected weight change.  HENT:   Negative for hearing loss and voice change.   Eyes:  Negative for eye problems and icterus.  Respiratory:  Negative for chest tightness, cough and shortness of breath.   Cardiovascular:  Negative for chest pain and leg swelling.  Gastrointestinal:  Negative for abdominal distention and abdominal pain.  Endocrine: Negative for hot flashes.  Genitourinary:  Negative for difficulty urinating, dysuria and frequency.   Musculoskeletal:  Positive for arthralgias and back pain.  Skin:  Negative for itching and rash.  Neurological:  Negative for light-headedness and numbness.  Hematological:  Negative for adenopathy. Does not bruise/bleed easily.  Psychiatric/Behavioral:  Negative for confusion.      MEDICAL HISTORY:  Past Medical History:  Diagnosis Date   Arthritis    BPH (benign prostatic  hyperplasia)    Cancer (HCC)    Prostate: states he had a positive biopsy but had another one a year later and it was gone.    Depression    Dizziness    GERD (gastroesophageal reflux disease)    Gout    High cholesterol    Hypertension    Sleep apnea    has cpap - not currently wearing   Varicose veins     SURGICAL HISTORY: Past Surgical History:  Procedure Laterality Date   ANTERIOR CERVICAL DECOMP/DISCECTOMY FUSION N/A 02/21/2018   Procedure: Cervical Five-Six Cervical Six-Seven Anterior cervical decompression/discectomy/fusion;  Surgeon: Ashok Pall, MD;  Location: [place];  Service: Neurosurgery;  Laterality: N/A;  Cervical Five-Six Cervical Six-Seven Anterior cervical decompression/discectomy/fusion   BIOPSY  10/09/2019   Procedure: BIOPSY;  Surgeon: Ronnette Juniper, MD;  Location: [facility];  Service: Gastroenterology;;   BLADDER SURGERY     CARPAL TUNNEL RELEASE Right 02/11/2019   Procedure: Right Carpal Tunnel Wound Irrigation;  Surgeon: Ashok Pall, MD;  Location: [place];  Service: Neurosurgery;  Laterality: Right;  Right Carpal tunnel wound exploration/wash out   CARPAL TUNNEL RELEASE Bilateral    CHOLECYSTECTOMY  11/22/2012   CHOLECYSTECTOMY  11/22/2012   Procedure: LAPAROSCOPIC CHOLECYSTECTOMY;  Surgeon: Gwenyth Ober, MD;  Location: [place];  Service: General;;   COLONOSCOPY     COLONOSCOPY WITH PROPOFOL N/A 10/09/2019   Procedure: COLONOSCOPY WITH PROPOFOL;  Surgeon: Ronnette Juniper, MD;  Location: [facility];  Service: Gastroenterology;  Laterality: N/A;   ESOPHAGOGASTRODUODENOSCOPY (EGD) WITH PROPOFOL N/A 10/09/2019   Procedure: ESOPHAGOGASTRODUODENOSCOPY (EGD) WITH PROPOFOL;  Surgeon: Ronnette Juniper, MD;  Location: [facility];  Service: Gastroenterology;  Laterality: N/A;   POLYPECTOMY  10/09/2019   Procedure: POLYPECTOMY;  Surgeon: Ronnette Juniper, MD;  Location: [facility];  Service: Gastroenterology;;    SOCIAL HISTORY: Social History   Socioeconomic History   Marital  status: Married    Spouse name: Not on file   Number of children: 2   Years of education: Not on file   Highest education level: Not on file  Occupational History   Occupation: Retired-mechanic  Tobacco Use   Smoking status: Former    Packs/day: 1.00    Years: 31.00    Additional pack years: 0.00    Total pack years: 31.00    Types: Cigarettes    Quit date: 04/12/1998    Years since quitting: 24.2    Passive exposure: Never   Smokeless tobacco: Never  Vaping Use   Vaping Use: Never used  Substance and Sexual Activity   Alcohol use: No   Drug use: No   Sexual activity: Not on file  Other Topics Concern   Not on file  Social History Narrative   Not on file   Social Determinants of Health   Financial Resource Strain: Not on file  Food Insecurity: Not on file  Transportation Needs: Not on file  Physical Activity: Not on file  Stress: Not on file  Social Connections: Not on file  Intimate Partner Violence: Not on file  FAMILY HISTORY: Family History  Problem Relation Age of Onset   Hypertension Mother    Diabetes Mother    Multiple myeloma Mother    Hypertension Father    Diabetes Father    Healthy Son    Healthy Daughter     ALLERGIES:  is allergic to atorvastatin, simvastatin, other, and statins.  MEDICATIONS:  Current Outpatient Medications  Medication Sig Dispense Refill   acyclovir (ZOVIRAX) 400 MG tablet Take 800 mg by mouth 2 (two) times daily.     folic acid (FOLVITE) 1 MG tablet TAKE 2 TABLETS BY MOUTH DAILY 180 tablet 3   furosemide (LASIX) 40 MG tablet Take 1 tablet (40 mg total) by mouth daily. 90 tablet 3   lenalidomide (REVLIMID) 10 MG capsule Take 1 capsule (10 mg total) by mouth daily. Take for 21 days, then hold for 7 days. Repeat every 28 days. 21 capsule 0   Multiple Vitamin (MULTIVITAMIN ADULT PO) Take by mouth.     sulfamethoxazole-trimethoprim (BACTRIM DS) 800-160 MG tablet Take 1 tablet by mouth 3 (three) times a week.     tamsulosin  (FLOMAX) 0.4 MG CAPS capsule Take 0.4 mg by mouth daily after supper.     Adalimumab (HUMIRA PEN) 40 MG/0.4ML PNKT Inject 40 mg into the skin every 14 (fourteen) days. (Patient not taking: Reported on 12/09/2021) 2 each 2   Dextromethorphan-guaiFENesin (MUCUS RELIEF DM PO) Take by mouth as needed. (Patient not taking: Reported on 04/19/2022)     EQ ASPIRIN ADULT LOW DOSE 81 MG tablet Take 81 mg by mouth daily. (Patient not taking: Reported on 04/19/2022)     ferrous sulfate 325 (65 FE) MG EC tablet Take 325 mg by mouth daily with breakfast. (Patient not taking: Reported on 04/19/2022)     hydrocortisone cream 0.5 % Apply 1 Application topically 3 (three) times daily. Apply small amount to affected area 3 times daily until healed (Patient not taking: Reported on 04/19/2022) 56 g 1   methotrexate (RHEUMATREX) 2.5 MG tablet TAKE 8 TABLETS BY MOUTH ONCE  WEEKLY . CAUTION: CHEMOTHERAPY.  PROTECTION FROM LIGHT (Patient not taking: Reported on 12/21/2021) 96 tablet 0   montelukast (SINGULAIR) 10 MG tablet TAKE 1 TABLET BY MOUTH ON THE  DAY PRIOR TO DARATUMUMAB  TREATMENT AND 1 TABLET BY MOUTH  DAILY FOR 2 DAYS AFTER TREATMENT (Patient not taking: Reported on 04/19/2022) 30 tablet 4   mupirocin cream (BACTROBAN) 2 % Apply 1 Application topically 2 (two) times daily. (Patient not taking: Reported on 04/19/2022) 30 g 0   Thiamine HCl (VITAMIN B-1) 250 MG tablet Take 1 tablet by mouth daily. (Patient not taking: Reported on 04/19/2022)     vitamin B-12 (CYANOCOBALAMIN) 100 MCG tablet Take 100 mcg by mouth daily. (Patient not taking: Reported on 04/19/2022)     Vitamin D-Vitamin K (K2-D3 MAX PO) Take by mouth. (Patient not taking: Reported on 04/19/2022)     No current facility-administered medications for this visit.   Facility-Administered Medications Ordered in Other Visits  Medication Dose Route Frequency Provider Last Rate Last Admin   acetaminophen (TYLENOL) 325 MG tablet            dexamethasone (DECADRON) 4 MG  tablet            diphenhydrAMINE (BENADRYL) 25 mg capsule              PHYSICAL EXAMINATION: ECOG PERFORMANCE STATUS: 1 - Symptomatic but completely ambulatory Vitals:   07/06/22 1117  BP: 130/75  Pulse:  81  Resp: 18  Temp: (!) 97 F (36.1 C)   Filed Weights   07/06/22 1117  Weight: 297 lb 3.2 oz (134.8 kg)    Physical Exam Constitutional:      General: He is not in acute distress.    Appearance: He is obese.     Comments: Patient walks with a cane.  HENT:     Head: Normocephalic and atraumatic.  Eyes:     General: No scleral icterus. Cardiovascular:     Rate and Rhythm: Normal rate and regular rhythm.     Heart sounds: Normal heart sounds.  Pulmonary:     Effort: Pulmonary effort is normal. No respiratory distress.     Breath sounds: No wheezing.  Abdominal:     General: Bowel sounds are normal. There is no distension.     Palpations: Abdomen is soft.  Musculoskeletal:        General: No deformity. Normal range of motion.     Cervical back: Normal range of motion and neck supple.     Comments: Trace edema, bilateral low extremities.   Skin:    General: Skin is warm and dry.     Findings: No erythema or rash.  Neurological:     Mental Status: He is alert and oriented to person, place, and time. Mental status is at baseline.     Cranial Nerves: No cranial nerve deficit.     Coordination: Coordination normal.  Psychiatric:        Mood and Affect: Mood normal.      LABORATORY DATA:  I have reviewed the data as listed Lab Results  Component Value Date   WBC 6.4 07/06/2022   HGB 13.0 07/06/2022   HCT 39.3 07/06/2022   MCV 95.4 07/06/2022   PLT 210 07/06/2022   Recent Labs    01/11/22 0838 04/19/22 1301 07/06/22 1042  NA 137 135 136  K 4.0 4.0 3.7  CL 108 104 104  CO2 23 23 23   GLUCOSE 102* 105* 105*  BUN 15 22 11   CREATININE 0.97 0.90 1.12  CALCIUM 8.8* 9.0 9.1  GFRNONAA >60 >60 >60  PROT 6.8 7.1 7.4  ALBUMIN 3.6 4.2 4.5  AST 18 16 18   ALT  7 8 8   ALKPHOS 70 76 97  BILITOT 0.3 0.3 0.3    Iron/TIBC/Ferritin/ %Sat    Component Value Date/Time   IRON 78 04/05/2021 1008   TIBC 274 04/05/2021 1008   FERRITIN 425 (H) 04/05/2021 1008   IRONPCTSAT 28 04/05/2021 1008      RADIOGRAPHIC STUDIES: I have personally reviewed the radiological images as listed and agreed with the findings in the report.  No results found.

## 2022-07-06 NOTE — Assessment & Plan Note (Addendum)
IgA lambda multiple myeloma, M protein 2.3, normal free light chain ratio Status post Dara Rvd, achieved CR, status post autologous bone marrow transplant 02/15/2022 at [facility] reviewed and discussed with patient. Stable counts. [place] myeloma/bone marrow transplant notes were reviewed. 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 MRD pending  maintenance with revlimid 10 mg on days 1-21 of 28 day cycle  vaccinations per protocol per on day 180 per [facility]  Awaiting dental clearance to start bone strengthening agent-Xgeva  - plan Q3 months to continue to 2 years of therapy.

## 2022-07-06 NOTE — Assessment & Plan Note (Signed)
Stable hemoglobin 

## 2022-07-06 NOTE — Progress Notes (Signed)
Pt here for follow up. Pt reports that he continue to have pain on tailbone from when he had the bone marrow biopsy.

## 2022-07-06 NOTE — Assessment & Plan Note (Signed)
Chemotherapy plan as listed above 

## 2022-07-07 LAB — KAPPA/LAMBDA LIGHT CHAINS
Kappa free light chain: 7.1 mg/L (ref 3.3–19.4)
Kappa, lambda light chain ratio: 2.03 — ABNORMAL HIGH (ref 0.26–1.65)
Lambda free light chains: 3.5 mg/L — ABNORMAL LOW (ref 5.7–26.3)

## 2022-07-10 LAB — MULTIPLE MYELOMA PANEL, SERUM
Albumin SerPl Elph-Mcnc: 3.8 g/dL (ref 2.9–4.4)
Albumin/Glob SerPl: 1.5 (ref 0.7–1.7)
Alpha 1: 0.3 g/dL (ref 0.0–0.4)
Alpha2 Glob SerPl Elph-Mcnc: 0.8 g/dL (ref 0.4–1.0)
B-Globulin SerPl Elph-Mcnc: 1.2 g/dL (ref 0.7–1.3)
Gamma Glob SerPl Elph-Mcnc: 0.5 g/dL (ref 0.4–1.8)
Globulin, Total: 2.7 g/dL (ref 2.2–3.9)
IgA: 25 mg/dL — ABNORMAL LOW (ref 61–437)
IgG (Immunoglobin G), Serum: 479 mg/dL — ABNORMAL LOW (ref 603–1613)
IgM (Immunoglobulin M), Srm: 9 mg/dL — ABNORMAL LOW (ref 20–172)
Total Protein ELP: 6.5 g/dL (ref 6.0–8.5)

## 2022-07-14 NOTE — Progress Notes (Signed)
Office Visit Note  Patient: Eric Cruz             Date of Birth: 11-Nov-1950           MRN: [id number]             PCP: Renford Dills, MD Referring: Renford Dills, MD Visit Date: 07/27/2022 Occupation: @GUAROCC @  Subjective:  Lower back pain  History of Present Illness: Eric Cruz is a 72 y.o. male with history of seropositive rheumatoid arthritis, osteoarthritis and multiple myeloma.  He states he underwent a stem cell transplant in November 2023 at [facility].  Tolerated it well.  He states over time his arthritis improved.  He is not having any joint inflammation or discomfort.  He states his chronic knee joint pain has also resolved.  He still uses cane for ambulation to be careful.  Patient states since his last bone marrow procedure he has been having some right-sided lower back pain.    Activities of Daily Living:  Patient reports morning stiffness for 0 minutes.   Patient Reports nocturnal pain.  Difficulty dressing/grooming: Denies Difficulty climbing stairs: Reports Difficulty getting out of chair: Reports Difficulty using hands for taps, buttons, cutlery, and/or writing: Denies  Review of Systems  Constitutional:  Positive for fatigue.  HENT:  Negative for mouth sores and mouth dryness.   Eyes:  Negative for dryness.  Respiratory:  Positive for shortness of breath. Negative for difficulty breathing.   Cardiovascular:  Negative for chest pain and palpitations.  Gastrointestinal:  Positive for diarrhea. Negative for blood in stool and constipation.  Endocrine: Negative for increased urination.  Genitourinary:  Positive for urgency. Negative for involuntary urination.  Musculoskeletal:  Positive for joint pain and joint pain. Negative for gait problem, joint swelling, myalgias, muscle weakness, morning stiffness, muscle tenderness and myalgias.  Skin:  Negative for color change, rash, hair loss and sensitivity to sunlight.   Allergic/Immunologic: Positive for susceptible to infections.  Neurological:  Negative for dizziness and headaches.  Hematological:  Negative for swollen glands.  Psychiatric/Behavioral:  Positive for sleep disturbance. Negative for depressed mood. The patient is not nervous/anxious.     PMFS History:  Patient Active Problem List   Diagnosis Date Noted   Abnormal weight gain 01/11/2022   Constipation 01/11/2022   Epigastric pain 01/11/2022   Morbid obesity 12/20/2021   Morbid obesity with BMI of 45.0-49.9, adult 11/30/2021   Leg swelling 11/02/2021   Chemotherapy induced diarrhea 11/02/2021   Neuropathy 09/02/2021   Normocytic anemia 09/02/2021   Encounter for antineoplastic chemotherapy 08/24/2021   Chest pain 08/25/2020   Gastroesophageal reflux disease 08/25/2020   Goals of care, counseling/discussion 04/22/2020   Multiple myeloma not having achieved remission 04/22/2020   Rheumatoid arthritis 04/07/2020   High risk medication use 04/07/2020   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    Depression    Dizziness    GERD (gastroesophageal reflux disease)    Gout    High cholesterol    Hypertension    Sleep apnea  has cpap - not currently wearing   Varicose veins     Family History  Problem Relation Age of Onset   Hypertension Mother    Diabetes Mother    Multiple myeloma Mother    Hypertension Father    Diabetes Father    Healthy Son    Healthy Daughter    Past Surgical History:  Procedure Laterality Date   ANTERIOR CERVICAL DECOMP/DISCECTOMY FUSION  N/A 02/21/2018   Procedure: Cervical Five-Six Cervical Six-Seven Anterior cervical decompression/discectomy/fusion;  Surgeon: Coletta Memos, MD;  Location: [facility] OR;  Service: Neurosurgery;  Laterality: N/A;  Cervical Five-Six Cervical Six-Seven Anterior cervical decompression/discectomy/fusion   BIOPSY  10/09/2019   Procedure: BIOPSY;  Surgeon: Kerin Salen, MD;  Location: [facility];  Service: Gastroenterology;;   BLADDER SURGERY     CARPAL TUNNEL RELEASE Right 02/11/2019   Procedure: Right Carpal Tunnel Wound Irrigation;  Surgeon: Coletta Memos, MD;  Location: [facility] OR;  Service: Neurosurgery;  Laterality: Right;  Right Carpal tunnel wound exploration/wash out   CARPAL TUNNEL RELEASE Bilateral    CHOLECYSTECTOMY  11/22/2012   CHOLECYSTECTOMY  11/22/2012   Procedure: LAPAROSCOPIC CHOLECYSTECTOMY;  Surgeon: Cherylynn Ridges, MD;  Location: [facility] OR;  Service: General;;   COLONOSCOPY     COLONOSCOPY WITH PROPOFOL N/A 10/09/2019   Procedure: COLONOSCOPY WITH PROPOFOL;  Surgeon: Kerin Salen, MD;  Location: [facility];  Service: Gastroenterology;  Laterality: N/A;   ESOPHAGOGASTRODUODENOSCOPY (EGD) WITH PROPOFOL N/A 10/09/2019   Procedure: ESOPHAGOGASTRODUODENOSCOPY (EGD) WITH PROPOFOL;  Surgeon: Kerin Salen, MD;  Location: [facility];  Service: Gastroenterology;  Laterality: N/A;   POLYPECTOMY  10/09/2019   Procedure: POLYPECTOMY;  Surgeon: Kerin Salen, MD;  Location: [facility];  Service: Gastroenterology;;   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   Social History   Social History Narrative   Not on file   Immunization History  Administered Date(s) Administered   Covid-19, Mrna,Vaccine(Spikevax)29yrs and older 05/30/2022   PFIZER(Purple Top)SARS-COV-2 Vaccination 03/04/2020, 04/05/2020, 09/14/2020     Objective: Vital Signs: BP 121/80 (BP Location: Left Arm, Patient Position: Sitting, Cuff Size: Large)   Pulse 85   Resp 18   Ht  (1.727 m)   Wt (!) 301 lb 6.4 oz (136.7 kg)   BMI 45.83 kg/m     Physical Exam Vitals and nursing note reviewed.  Constitutional:      Appearance: He is well-developed.  HENT:     Head: Normocephalic and atraumatic.  Eyes:     Conjunctiva/sclera: Conjunctivae normal.     Pupils: Pupils are equal, round, and reactive to light.  Cardiovascular:     Rate and Rhythm: Normal rate and regular rhythm.     Heart sounds: Normal heart sounds.  Pulmonary:     Effort: Pulmonary effort is normal.     Breath sounds: Normal breath sounds.  Abdominal:     General: Bowel sounds are normal.     Palpations: Abdomen is soft.  Musculoskeletal:     Cervical back: Normal range of motion and neck supple.  Skin:    General: Skin is warm and dry.     Capillary Refill: Capillary refill takes less than 2 seconds.  Neurological:     Mental Status: He is alert and oriented to person, place, and time.  Psychiatric:        Behavior: Behavior normal.      Musculoskeletal Exam: Cervical spine was in good range of motion.  He had no point tenderness over thoracic or lumbar spine.  He had tenderness  in the right lower lumbar paravertebral region.  Shoulder joints, elbow joints, wrist joints, MCPs PIPs and DIPs Juengel range of motion with no synovitis.  Hip joints were difficult to assess in the sitting position.  Knee joints with good range of motion without any warmth swelling or effusion.  There was no tenderness over ankles or MTPs.  CDAI Exam: CDAI Score: -- Patient Global: 0 mm; Provider Global: 0 mm Swollen: --; Tender: -- Joint Exam 07/27/2022   No joint exam has been documented for this visit   There is currently no information documented on the homunculus. Go to the Rheumatology activity and complete the homunculus joint exam.  Investigation: No additional findings.  Imaging: No results found.  Recent Labs: Lab Results  Component Value Date   WBC 6.4 07/06/2022   HGB 13.0 07/06/2022   PLT 210 07/06/2022   NA 136 07/06/2022   K 3.7 07/06/2022    CL 104 07/06/2022   CO2 23 07/06/2022   GLUCOSE 105 (H) 07/06/2022   BUN 11 07/06/2022   CREATININE 1.12 07/06/2022   BILITOT 0.3 07/06/2022   ALKPHOS 97 07/06/2022   AST 18 07/06/2022   ALT 8 07/06/2022   PROT 7.4 07/06/2022   ALBUMIN 4.5 07/06/2022   CALCIUM 9.1 07/06/2022   GFRAA 104 06/07/2020   QFTBGOLDPLUS NEGATIVE 03/14/2021    Speciality Comments: Start date :Humira April 2022, methotrexate January 2022  Procedures:  No procedures performed Allergies: Atorvastatin, Simvastatin, Other, and Statins   Assessment / Plan:     Visit Diagnoses: Rheumatoid arthritis with rheumatoid factor of multiple sites without organ or systems involvement - Positive RF, positive anti-CCP, elevated sed rate, synovitis involving multiple joints: Patient was treated with Ro and methotrexate in the past.  He has been off both medications.  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  He states that he has not experienced any joint inflammation or swelling.  I advised patient to contact us if he develops any pain or swelling.  High risk medication use - Holding Humira and MTX-since May 2023 while under the care of Dr. Cathie Hoops for management of multiple myeloma.  He had chemotherapy followed by stem cell transplant.  Arthritis of both acromioclavicular joints-he did denies any discomfort today.  Contracture of right elbow-unchanged.  No synovitis was noted.  HNP (herniated nucleus pulposus) with myelopathy, cervical-he denies any discomfort today.  History of bilateral carpal tunnel release  Multiple myeloma not having achieved remission - Followed by Dr. Cathie Hoops.  Patient has been holding Humira and methotrexate since May 2023.  He had the stem cell transplant in November 2023 which she tolerated well.  He has been experiencing some right-sided pain since last bone marrow transplant.  He will follow-up with them.  He is on Revlimid.  Patient has been experiencing diarrhea which she  thinks is related to Revlimid.  He is also on Bactrim prophylaxis.  Symptomatic cholelithiasis  Orders: No orders of the defined types were placed in this encounter.  No orders of the defined types were placed in this encounter.    Follow-Up Instructions: Return in about 6 months (around 01/26/2023) for Rheumatoid arthritis, Osteoarthritis.   Pollyann Savoy, MD  Note - This record has been created using Animal nutritionist.  Chart creation errors have been sought, but may not always  have been located. Such creation errors do not reflect on  the standard of medical care.

## 2022-07-18 ENCOUNTER — Telehealth: Payer: Self-pay | Admitting: *Deleted

## 2022-07-18 NOTE — Telephone Encounter (Signed)
Please return call to Dental office regarding dental clearance requested. Needs to know if it needs to come from his oral medicine provider or his general dentist Dr Clinton Sawyer

## 2022-07-18 NOTE — Telephone Encounter (Signed)
Spoke to [place] at [facility]. Dental clearance form faxed.

## 2022-07-20 ENCOUNTER — Other Ambulatory Visit: Payer: Self-pay

## 2022-07-20 DIAGNOSIS — C9000 Multiple myeloma not having achieved remission: Secondary | ICD-10-CM

## 2022-07-20 MED ORDER — LENALIDOMIDE 10 MG PO CAPS
10.0000 mg | ORAL_CAPSULE | Freq: Every day | ORAL | 0 refills | Status: DC
Start: 2022-07-20 — End: 2022-08-16

## 2022-07-27 ENCOUNTER — Encounter: Payer: Self-pay | Admitting: Rheumatology

## 2022-07-27 ENCOUNTER — Ambulatory Visit: Payer: Medicare Other | Attending: Rheumatology | Admitting: Rheumatology

## 2022-07-27 VITALS — BP 121/80 | HR 85 | Resp 18 | Ht 68.0 in | Wt 301.4 lb

## 2022-07-27 DIAGNOSIS — M19012 Primary osteoarthritis, left shoulder: Secondary | ICD-10-CM | POA: Diagnosis not present

## 2022-07-27 DIAGNOSIS — M5000 Cervical disc disorder with myelopathy, unspecified cervical region: Secondary | ICD-10-CM

## 2022-07-27 DIAGNOSIS — Z9889 Other specified postprocedural states: Secondary | ICD-10-CM | POA: Diagnosis not present

## 2022-07-27 DIAGNOSIS — M19011 Primary osteoarthritis, right shoulder: Secondary | ICD-10-CM

## 2022-07-27 DIAGNOSIS — M0579 Rheumatoid arthritis with rheumatoid factor of multiple sites without organ or systems involvement: Secondary | ICD-10-CM

## 2022-07-27 DIAGNOSIS — M24521 Contracture, right elbow: Secondary | ICD-10-CM

## 2022-07-27 DIAGNOSIS — Z79899 Other long term (current) drug therapy: Secondary | ICD-10-CM | POA: Diagnosis not present

## 2022-07-27 DIAGNOSIS — C9000 Multiple myeloma not having achieved remission: Secondary | ICD-10-CM

## 2022-07-27 DIAGNOSIS — K8020 Calculus of gallbladder without cholecystitis without obstruction: Secondary | ICD-10-CM

## 2022-07-28 ENCOUNTER — Encounter: Payer: Self-pay | Admitting: Oncology

## 2022-07-28 ENCOUNTER — Other Ambulatory Visit: Payer: Self-pay

## 2022-07-28 ENCOUNTER — Inpatient Hospital Stay: Payer: Medicare Other | Admitting: Oncology

## 2022-07-28 ENCOUNTER — Inpatient Hospital Stay: Payer: Medicare Other

## 2022-07-28 VITALS — BP 145/86 | HR 85 | Temp 97.0°F | Resp 18 | Wt 300.4 lb

## 2022-07-28 DIAGNOSIS — Z5111 Encounter for antineoplastic chemotherapy: Secondary | ICD-10-CM

## 2022-07-28 DIAGNOSIS — K521 Toxic gastroenteritis and colitis: Secondary | ICD-10-CM

## 2022-07-28 DIAGNOSIS — G4730 Sleep apnea, unspecified: Secondary | ICD-10-CM | POA: Diagnosis not present

## 2022-07-28 DIAGNOSIS — D649 Anemia, unspecified: Secondary | ICD-10-CM | POA: Diagnosis not present

## 2022-07-28 DIAGNOSIS — C9000 Multiple myeloma not having achieved remission: Secondary | ICD-10-CM

## 2022-07-28 DIAGNOSIS — Z7982 Long term (current) use of aspirin: Secondary | ICD-10-CM | POA: Diagnosis not present

## 2022-07-28 DIAGNOSIS — Z9481 Bone marrow transplant status: Secondary | ICD-10-CM | POA: Diagnosis not present

## 2022-07-28 DIAGNOSIS — Z87891 Personal history of nicotine dependence: Secondary | ICD-10-CM | POA: Diagnosis not present

## 2022-07-28 DIAGNOSIS — E7800 Pure hypercholesterolemia, unspecified: Secondary | ICD-10-CM | POA: Diagnosis not present

## 2022-07-28 DIAGNOSIS — Z79624 Long term (current) use of inhibitors of nucleotide synthesis: Secondary | ICD-10-CM | POA: Diagnosis not present

## 2022-07-28 DIAGNOSIS — Z9484 Stem cells transplant status: Secondary | ICD-10-CM | POA: Diagnosis not present

## 2022-07-28 DIAGNOSIS — I10 Essential (primary) hypertension: Secondary | ICD-10-CM | POA: Diagnosis not present

## 2022-07-28 DIAGNOSIS — T451X5A Adverse effect of antineoplastic and immunosuppressive drugs, initial encounter: Secondary | ICD-10-CM | POA: Diagnosis not present

## 2022-07-28 DIAGNOSIS — Z7961 Long term (current) use of immunomodulator: Secondary | ICD-10-CM | POA: Diagnosis not present

## 2022-07-28 DIAGNOSIS — Z79899 Other long term (current) drug therapy: Secondary | ICD-10-CM | POA: Diagnosis not present

## 2022-07-28 DIAGNOSIS — G629 Polyneuropathy, unspecified: Secondary | ICD-10-CM | POA: Diagnosis not present

## 2022-07-28 LAB — CBC WITH DIFFERENTIAL (CANCER CENTER ONLY)
Abs Immature Granulocytes: 0.03 10*3/uL (ref 0.00–0.07)
Basophils Absolute: 0.1 10*3/uL (ref 0.0–0.1)
Basophils Relative: 1 %
Eosinophils Absolute: 0.2 10*3/uL (ref 0.0–0.5)
Eosinophils Relative: 3 %
HCT: 39.1 % (ref 39.0–52.0)
Hemoglobin: 13 g/dL (ref 13.0–17.0)
Immature Granulocytes: 0 %
Lymphocytes Relative: 15 %
Lymphs Abs: 1.2 10*3/uL (ref 0.7–4.0)
MCH: 31.4 pg (ref 26.0–34.0)
MCHC: 33.2 g/dL (ref 30.0–36.0)
MCV: 94.4 fL (ref 80.0–100.0)
Monocytes Absolute: 1.1 10*3/uL — ABNORMAL HIGH (ref 0.1–1.0)
Monocytes Relative: 15 %
Neutro Abs: 5 10*3/uL (ref 1.7–7.7)
Neutrophils Relative %: 66 %
Platelet Count: 283 10*3/uL (ref 150–400)
RBC: 4.14 MIL/uL — ABNORMAL LOW (ref 4.22–5.81)
RDW: 13.9 % (ref 11.5–15.5)
WBC Count: 7.6 10*3/uL (ref 4.0–10.5)
nRBC: 0 % (ref 0.0–0.2)

## 2022-07-28 LAB — CMP (CANCER CENTER ONLY)
ALT: 9 U/L (ref 0–44)
AST: 18 U/L (ref 15–41)
Albumin: 4.3 g/dL (ref 3.5–5.0)
Alkaline Phosphatase: 100 U/L (ref 38–126)
Anion gap: 8 (ref 5–15)
BUN: 17 mg/dL (ref 8–23)
CO2: 23 mmol/L (ref 22–32)
Calcium: 9.2 mg/dL (ref 8.9–10.3)
Chloride: 104 mmol/L (ref 98–111)
Creatinine: 0.87 mg/dL (ref 0.61–1.24)
GFR, Estimated: >60 mL/min (ref >60)
Glucose, Bld: 105 mg/dL — ABNORMAL HIGH (ref 70–99)
Potassium: 3.8 mmol/L (ref 3.5–5.1)
Sodium: 135 mmol/L (ref 135–145)
Total Bilirubin: 0.4 mg/dL (ref 0.3–1.2)
Total Protein: 7.3 g/dL (ref 6.5–8.1)

## 2022-07-28 MED ORDER — LOPERAMIDE HCL 2 MG PO CAPS: 2.0000 mg | ORAL_CAPSULE | ORAL | 2 refills | Status: DC

## 2022-07-28 NOTE — Assessment & Plan Note (Signed)
secondary to carpal tunnel disease and chemotherapy Patient did not tolerate gabapentin.  Monitor symptoms-stable  

## 2022-07-28 NOTE — Assessment & Plan Note (Signed)
Stable hemoglobin 

## 2022-07-28 NOTE — Assessment & Plan Note (Addendum)
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 Stable counts. [facility] myeloma/bone marrow transplant notes were reviewed. 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 MRD pending  maintenance with revlimid 10 mg on days 1-21 of 28 day cycle - he already started this cycle on 07/27/2022  vaccinations per protocol per on day 180 per [facility]  Awaiting dental clearance to start bone strengthening agent-Xgeva  - plan Q3 months to continue to 2 years of therapy.

## 2022-07-28 NOTE — Assessment & Plan Note (Signed)
Chemotherapy plan as listed above 

## 2022-07-28 NOTE — Assessment & Plan Note (Signed)
Encourage oral hydration. Recommend Imodium as needed per instruction. He agrees. Rx sent. Marland Kitchen

## 2022-07-28 NOTE — Progress Notes (Signed)
Hematology/Oncology Progress note [phone] [phone]      CHIEF COMPLAINTS/REASON FOR VISIT:  Follow-up for multiple myeloma   ASSESSMENT & PLAN:   Cancer Staging  Multiple myeloma not having achieved remission (HCC) Staging form: Plasma Cell Myeloma and Plasma Cell Disorders, AJCC 8th Edition - Clinical stage from 04/22/2020: RISS Stage II (Beta-2-microglobulin (mg/L): 2.7, Albumin (g/dL): 3.1, ISS: Stage II, High-risk cytogenetics: Absent, LDH: Normal) - Signed by Rickard Patience, MD on 08/10/2021   Multiple myeloma not having achieved remission (HCC) IgA lambda multiple myeloma, 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 Stable counts. [facility] myeloma/bone marrow transplant notes were reviewed. 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 MRD pending  maintenance with revlimid 10 mg on days 1-21 of 28 day cycle - he already started this cycle on 07/27/2022  vaccinations per protocol per on day 180 per [facility]  Awaiting dental clearance to start bone strengthening agent-Xgeva  - plan Q3 months to continue to 2 years of therapy.   Encounter for antineoplastic chemotherapy Chemotherapy plan as listed above.  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  Monitor symptoms-stable   Chemotherapy induced diarrhea Encourage oral hydration. Recommend Imodium as needed per instruction. He agrees. Rx sent. .    Normocytic anemia Stable hemoglobin   Orders Placed This Encounter  Procedures   CMP (Cancer Center only)     Standing Status:   Future    Standing Expiration Date:   07/28/2023   CBC with Differential (Cancer Center Only)    Standing Status:   Future    Standing Expiration Date:   07/28/2023   Kappa/lambda light chains    Standing Status:   Future    Standing Expiration Date:   07/28/2023   Multiple Myeloma Panel (SPEP&IFE w/QIG)    Standing Status:   Future    Standing Expiration Date:   07/28/2023     Follow up in 4 weeks.  All questions were answered. The patient knows to call the clinic with any problems, questions or concerns.  Rickard Patience, MD, PhD [facility] Hematology Oncology 07/28/2022    HISTORY OF PRESENTING ILLNESS:  Eric Cruz is a 72 y.o. male presents for follow up of myeloma .  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  27% plasma cell, cytogenetics - normal, and myeloma FISH panel is negative.  Standard risk.  [place] red staining was added and was negative.  - 07/27/2021, bone marrow biopsy showed hypercellular marrow involved by plasma cell myeloma, CD138 immunohistochemistry plasma cells compromise approximately 70% of the cellular elements and are lambda restricted by light chain in situ hybridization.  Cytogenetics showed duplication of 1q.  Cytogenetics is normal.   04/22/2020 Cancer Staging   Staging form: Plasma Cell Myeloma and Plasma Cell Disorders, AJCC 8th Edition - Clinical stage from 04/22/2020: RISS Stage II (Beta-2-microglobulin (mg/L): 2.7, Albumin (g/dL): 3.1, ISS: Stage II, High-risk cytogenetics: Absent, LDH: Normal) - Signed by  Rickard Patience, MD on 08/10/2021 Stage prefix: Initial diagnosis Beta 2 microglobulin range (mg/L): Less than 3.5 Albumin range (g/dL): Less than 3.5 Cytogenetics: 1q addition Lactate dehydrogenase (LDH) (U/L): 136 Serum calcium level:  Normal Serum creatinine level: Normal   08/12/2021 Imaging   PET scan showed 1. No evidence of active myeloma on skull base to thigh FDG PET scan. 2. No soft tissue plasmacytoma.3. No lytic or suspicious lesion on CT portion exam.     08/22/2021 -  Chemotherapy   Patient is on Treatment Plan : MYELOMA NEWLY DIAGNOSED TRANSPLANT CANDIDATE DaraVRd (Daratumumab SQ) q21d x 6 Cycles (Induction/Consolidation)     08/24/2021 - 11/09/2021 Chemotherapy   Patient is on Treatment Plan : MYELOMA NEWLY DIAGNOSED TRANSPLANT CANDIDATE DaraVRd (Daratumumab SQ) q21d x 6 Cycles (Induction/Consolidation)     11/02/2021 Imaging   Bilateral lower extremity US negative for DVT   11/15/2021 Echocardiogram   1. Left ventricular ejection fraction, by estimation, is 60 to 65%. The left ventricle has normal function. The left ventricle has no regional wall motion abnormalities. Left ventricular diastolic parameters are consistent with Grade II diastolic dysfunction (pseudonormalization). The average left ventricular global longitudinal strain is -17.4 %.   2. Right ventricular systolic function is normal. The right ventricular size is normal. There is mildly elevated pulmonary artery systolic pressure. The estimated right ventricular systolic pressure is 37.7 mmHg.   3. The mitral valve is normal in structure. Mild mitral valve regurgitation. No evidence of mitral stenosis.  4. The aortic valve was not well visualized. Aortic valve regurgitation  is not visualized. No aortic stenosis is present.   5. The inferior vena cava is normal in size with greater than 50% respiratory variability, suggesting right atrial pressure of 3 mmHg.    01/23/2022 Bone Marrow Biopsy   Mildly hypercellular bone marrow (40%) with increased erythropoiesis and 2% plasma cells.    02/15/2022 Procedure   Status post autologous stem cell transplant at [facility] Preparative regimen with Melphalan 200 mg/m2 on  02/14/22 Infusion of stem cells - 6.7 x 10(6) on 02/15/22    06/13/2022 Procedure   Bone marrow biopsy at [facility] showed: Mildly hypercellular bone marrow (40%) with trilineage hematopoiesis and no increase in plasma cells. MRD pending    06/30/2022 -  Chemotherapy   Started on maintenance Revlimid 10mg  D1-21 Q28 days.     For rheumatoid arthritis, patient was seen by Dr. Corliss Skains recently and is currently off methotrexate and Humira.  + Bilateral lower extremity swelling.[place] negative for DVT, rade II diastolic dysfunction one Echo  INTERVAL HISTORY Eric Cruz is a 72 y.o. male who has above history reviewed by me today presents for follow up visit for management of multiple myeloma Repeat bone marrow biopsy showed CR He reports feeling well.  He has started the cycles of Revlimid on 07/27/2022. Patient has noticed loose bowel movement episodes.  He has baseline of loose bowel movements after gallbladder removal.  He did noticed that he has more loose bowel movements when he is on malonate.  No other new complaints.    Review of Systems  Constitutional:  Negative for appetite change, chills, fatigue, fever and unexpected weight change.  HENT:   Negative for hearing loss and voice change.   Eyes:  Negative for eye problems and icterus.  Respiratory:  Negative for chest tightness, cough and shortness of breath.   Cardiovascular:  Negative for chest pain and leg swelling.  Gastrointestinal:  Negative for abdominal distention  and abdominal pain.  Endocrine: Negative for hot flashes.  Genitourinary:  Negative for difficulty urinating, dysuria and frequency.   Musculoskeletal:  Positive for arthralgias and back pain.  Skin:  Negative for itching and rash.  Neurological:  Negative for light-headedness and numbness.  Hematological:  Negative for adenopathy. Does not bruise/bleed easily.  Psychiatric/Behavioral:  Negative for confusion.      MEDICAL HISTORY:  Past Medical History:   Diagnosis Date   Arthritis    BPH (benign prostatic hyperplasia)    Cancer (HCC)    Prostate: states he had a positive biopsy but had another one a year later and it was gone.    Depression    Dizziness    GERD (gastroesophageal reflux disease)    Gout    High cholesterol    Hypertension    Sleep apnea    has cpap - not currently wearing   Varicose veins     SURGICAL HISTORY: Past Surgical History:  Procedure Laterality Date   ANTERIOR CERVICAL DECOMP/DISCECTOMY FUSION N/A 02/21/2018   Procedure: Cervical Five-Six Cervical Six-Seven Anterior cervical decompression/discectomy/fusion;  Surgeon: Coletta Memos, MD;  Location: [facility] OR;  Service: Neurosurgery;  Laterality: N/A;  Cervical Five-Six Cervical Six-Seven Anterior cervical decompression/discectomy/fusion   BIOPSY  10/09/2019   Procedure: BIOPSY;  Surgeon: Kerin Salen, MD;  Location: [facility] ENDOSCOPY;  Service: Gastroenterology;;   BLADDER SURGERY     CARPAL TUNNEL RELEASE Right 02/11/2019   Procedure: Right Carpal Tunnel Wound Irrigation;  Surgeon: Coletta Memos, MD;  Location: [facility] OR;  Service: Neurosurgery;  Laterality: Right;  Right Carpal tunnel wound exploration/wash out   CARPAL TUNNEL RELEASE Bilateral    CHOLECYSTECTOMY  11/22/2012   CHOLECYSTECTOMY  11/22/2012   Procedure: LAPAROSCOPIC CHOLECYSTECTOMY;  Surgeon: Cherylynn Ridges, MD;  Location: [facility] OR;  Service: General;;   COLONOSCOPY     COLONOSCOPY WITH PROPOFOL N/A 10/09/2019   Procedure: COLONOSCOPY WITH PROPOFOL;  Surgeon: Kerin Salen, MD;  Location: [facility]OSCOPY;  Service: Gastroenterology;  Laterality: N/A;   ESOPHAGOGASTRODUODENOSCOPY (EGD) WITH PROPOFOL N/A 10/09/2019   Procedure: ESOPHAGOGASTRODUODENOSCOPY (EGD) WITH PROPOFOL;  Surgeon: Kerin Salen, MD;  Location: [facility];  Service: Gastroenterology;  Laterality: N/A;   POLYPECTOMY  10/09/2019   Procedure: POLYPECTOMY;  Surgeon: Kerin Salen, MD;  Location: [facility] END[facility]COPY;  Service: Gastroenterology;;   stem cell  transplant  02/2022    SOCIAL HISTORY: Social History   Socioeconomic History   Marital status: Married    Spouse name: Not on file   Number of children: 2   Years of education: Not on file   Highest education level: Not on file  Occupational History   Occupation: Retired-mechanic  Tobacco Use   Smoking status: Former    Packs/day: 1.00    Years: 31.00    Additional pack years: 0.00    Total pack years: 31.00    Types: Cigarettes    Quit date: 04/12/1998    Years since quitting: 24.3    Passive exposure: Never   Smokeless tobacco: Never  Vaping Use   Vaping Use: Never used  Substance and Sexual Activity   Alcohol use: No   Drug use: No   Sexual activity: Not on file  Other Topics Concern   Not on file  Social History Narrative   Not on file   Social Determinants of Health   Financial Resource Strain: Not on file  Food Insecurity: Not on file  Transportation Needs: Not on file  Physical Activity: Not on file  Stress:  Not on file  Social Connections: Not on file  Intimate Partner Violence: Not on file    FAMILY HISTORY: Family History  Problem Relation Age of Onset   Hypertension Mother    Diabetes Mother    Multiple myeloma Mother    Hypertension Father    Diabetes Father    Healthy Son    Healthy Daughter     ALLERGIES:  is allergic to atorvastatin, simvastatin, other, and statins.  MEDICATIONS:  Current Outpatient Medications  Medication Sig Dispense Refill   acyclovir (ZOVIRAX) 800 MG tablet Take 800 mg by mouth 2 (two) times daily.     folic acid (FOLVITE) 1 MG tablet TAKE 2 TABLETS BY MOUTH DAILY 180 tablet 3   furosemide (LASIX) 40 MG tablet Take 1 tablet (40 mg total) by mouth daily. 90 tablet 3   lenalidomide (REVLIMID) 10 MG capsule Take 1 capsule (10 mg total) by mouth daily. Take for 21 days, then hold for 7 days. Repeat every 28 days. 21 capsule 0   loperamide (IMODIUM) 2 MG capsule Take 1 capsule (2 mg total) by mouth See admin  instructions. As needed for diarrhea, Initial: 4 mg,the 2 mg every 2 hours (4 mg every 4 hours at night) until diarrhea stops. maximum: 16 mg/day 60 capsule 2   Multiple Vitamin (MULTIVITAMIN ADULT PO) Take by mouth.     sulfamethoxazole-trimethoprim (BACTRIM DS) 800-160 MG tablet Take 1 tablet by mouth 3 (three) times a week.     tamsulosin (FLOMAX) 0.4 MG CAPS capsule Take 0.4 mg by mouth daily after supper.     acyclovir (ZOVIRAX) 400 MG tablet Take 800 mg by mouth 2 (two) times daily. (Patient not taking: Reported on 07/27/2022)     No current facility-administered medications for this visit.   Facility-Administered Medications Ordered in Other Visits  Medication Dose Route Frequency Provider Last Rate Last Admin   acetaminophen (TYLENOL) 325 MG tablet            dexamethasone (DECADRON) 4 MG tablet            diphenhydrAMINE (BENADRYL) 25 mg capsule              PHYSICAL EXAMINATION: ECOG PERFORMANCE STATUS: 1 - Symptomatic but completely ambulatory Vitals:   07/28/22 1143  BP: (!) 145/86  Pulse: 85  Resp: 18  Temp: (!) 97 F (36.1 C)  SpO2: 99%   Filed Weights   07/28/22 1143  Weight: (!) 300 lb 6.4 oz (136.3 kg)    Physical Exam Constitutional:      General: He is not in acute distress.    Appearance: He is obese.     Comments: Patient walks with a cane.  HENT:     Head: Normocephalic and atraumatic.  Eyes:     General: No scleral icterus. Cardiovascular:     Rate and Rhythm: Normal rate and regular rhythm.     Heart sounds: Normal heart sounds.  Pulmonary:     Effort: Pulmonary effort is normal. No respiratory distress.     Breath sounds: No wheezing.  Abdominal:     General: Bowel sounds are normal. There is no distension.     Palpations: Abdomen is soft.  Musculoskeletal:        General: No deformity. Normal range of motion.     Cervical back: Normal range of motion and neck supple.     Comments: Trace edema, bilateral low extremities.   Skin:     General: Skin is warm  and dry.     Findings: No erythema or rash.  Neurological:     Mental Status: He is alert and oriented to person, place, and time. Mental status is at baseline.     Cranial Nerves: No cranial nerve deficit.     Coordination: Coordination normal.  Psychiatric:        Mood and Affect: Mood normal.      LABORATORY DATA:  I have reviewed the data as listed Lab Results  Component Value Date   WBC 7.6 07/28/2022   HGB 13.0 07/28/2022   HCT 39.1 07/28/2022   MCV 94.4 07/28/2022   PLT 283 07/28/2022   Recent Labs    04/19/22 1301 07/06/22 1042 07/28/22 1132  NA 135 136 135  K 4.0 3.7 3.8  CL 104 104 104  CO2 23 23 23   GLUCOSE 105* 105* 105*  BUN 22 11 17   CREATININE 0.90 1.12 0.87  CALCIUM 9.0 9.1 9.2  GFRNONAA >60 >60 >60  PROT 7.1 7.4 7.3  ALBUMIN 4.2 4.5 4.3  AST 16 18 18   ALT 8 8 9   ALKPHOS 76 97 100  BILITOT 0.3 0.3 0.4    Iron/TIBC/Ferritin/ %Sat    Component Value Date/Time   IRON 78 04/05/2021 1008   TIBC 274 04/05/2021 1008   FERRITIN 425 (H) 04/05/2021 1008   IRONPCTSAT 28 04/05/2021 1008      RADIOGRAPHIC STUDIES: I have personally reviewed the radiological images as listed and agreed with the findings in the report.  No results found.

## 2022-07-31 LAB — KAPPA/LAMBDA LIGHT CHAINS
Kappa free light chain: 12.3 mg/L (ref 3.3–19.4)
Kappa, lambda light chain ratio: 1.23 (ref 0.26–1.65)
Lambda free light chains: 10 mg/L (ref 5.7–26.3)

## 2022-08-03 ENCOUNTER — Telehealth: Payer: Self-pay | Admitting: Oncology

## 2022-08-03 NOTE — Telephone Encounter (Signed)
Received OVN from Dr. Steva Ready (available in media) stating that "patient is cleared to initiate anti-resorptive therapy." I spoke to pt on 5/1 and informed him of this. Also informed him that dental clearance form was faxed to Dr. Yetta Numbers office a few weeks ago, but we have not received it.  Dental clearance re-faxed to Dr. Yetta Numbers office.   Will add zometa to visit on 5/23 based on Dr. Kennyth Arnold clearance, we will wait on clearance from Dr. Yetta Numbers office.

## 2022-08-03 NOTE — Telephone Encounter (Signed)
Dental clearance received

## 2022-08-03 NOTE — Telephone Encounter (Signed)
Patient called stating that his dentist has not received dental clearance and he would like to f/u on the paperwork.

## 2022-08-04 LAB — MULTIPLE MYELOMA PANEL, SERUM
Albumin SerPl Elph-Mcnc: 3.8 g/dL (ref 2.9–4.4)
Albumin/Glob SerPl: 1.4 (ref 0.7–1.7)
Alpha 1: 0.3 g/dL (ref 0.0–0.4)
Alpha2 Glob SerPl Elph-Mcnc: 0.8 g/dL (ref 0.4–1.0)
B-Globulin SerPl Elph-Mcnc: 1.2 g/dL (ref 0.7–1.3)
Gamma Glob SerPl Elph-Mcnc: 0.5 g/dL (ref 0.4–1.8)
Globulin, Total: 2.8 g/dL (ref 2.2–3.9)
IgA: 51 mg/dL — ABNORMAL LOW (ref 61–437)
IgG (Immunoglobin G), Serum: 616 mg/dL (ref 603–1613)
IgM (Immunoglobulin M), Srm: 17 mg/dL — ABNORMAL LOW (ref 20–172)
Total Protein ELP: 6.6 g/dL (ref 6.0–8.5)

## 2022-08-08 ENCOUNTER — Ambulatory Visit: Payer: Medicare Other | Admitting: Oncology

## 2022-08-08 ENCOUNTER — Other Ambulatory Visit: Payer: Medicare Other

## 2022-08-15 DIAGNOSIS — C9001 Multiple myeloma in remission: Secondary | ICD-10-CM | POA: Diagnosis not present

## 2022-08-15 DIAGNOSIS — Z23 Encounter for immunization: Secondary | ICD-10-CM | POA: Diagnosis not present

## 2022-08-15 DIAGNOSIS — Z9484 Stem cells transplant status: Secondary | ICD-10-CM | POA: Diagnosis not present

## 2022-08-16 ENCOUNTER — Other Ambulatory Visit: Payer: Self-pay | Admitting: *Deleted

## 2022-08-16 DIAGNOSIS — C9000 Multiple myeloma not having achieved remission: Secondary | ICD-10-CM

## 2022-08-16 MED ORDER — LENALIDOMIDE 10 MG PO CAPS
10.0000 mg | ORAL_CAPSULE | Freq: Every day | ORAL | 0 refills | Status: DC
Start: 2022-08-16 — End: 2022-09-14

## 2022-08-17 ENCOUNTER — Other Ambulatory Visit: Payer: Medicare Other

## 2022-08-17 ENCOUNTER — Ambulatory Visit: Payer: Medicare Other | Admitting: Oncology

## 2022-08-20 ENCOUNTER — Other Ambulatory Visit: Payer: Self-pay | Admitting: Rheumatology

## 2022-08-24 ENCOUNTER — Encounter: Payer: Self-pay | Admitting: Oncology

## 2022-08-24 ENCOUNTER — Inpatient Hospital Stay: Payer: Medicare Other | Attending: Oncology

## 2022-08-24 ENCOUNTER — Inpatient Hospital Stay: Payer: Medicare Other | Admitting: Oncology

## 2022-08-24 ENCOUNTER — Inpatient Hospital Stay: Payer: Medicare Other

## 2022-08-24 VITALS — BP 122/86 | HR 82 | Temp 97.1°F | Resp 18 | Wt 301.4 lb

## 2022-08-24 DIAGNOSIS — Z7982 Long term (current) use of aspirin: Secondary | ICD-10-CM | POA: Diagnosis not present

## 2022-08-24 DIAGNOSIS — M069 Rheumatoid arthritis, unspecified: Secondary | ICD-10-CM | POA: Diagnosis not present

## 2022-08-24 DIAGNOSIS — G629 Polyneuropathy, unspecified: Secondary | ICD-10-CM

## 2022-08-24 DIAGNOSIS — T451X5A Adverse effect of antineoplastic and immunosuppressive drugs, initial encounter: Secondary | ICD-10-CM

## 2022-08-24 DIAGNOSIS — Z87891 Personal history of nicotine dependence: Secondary | ICD-10-CM | POA: Diagnosis not present

## 2022-08-24 DIAGNOSIS — Z7961 Long term (current) use of immunomodulator: Secondary | ICD-10-CM | POA: Diagnosis not present

## 2022-08-24 DIAGNOSIS — R197 Diarrhea, unspecified: Secondary | ICD-10-CM | POA: Diagnosis not present

## 2022-08-24 DIAGNOSIS — Z79624 Long term (current) use of inhibitors of nucleotide synthesis: Secondary | ICD-10-CM | POA: Diagnosis not present

## 2022-08-24 DIAGNOSIS — C9000 Multiple myeloma not having achieved remission: Secondary | ICD-10-CM | POA: Diagnosis not present

## 2022-08-24 DIAGNOSIS — Z79899 Other long term (current) drug therapy: Secondary | ICD-10-CM | POA: Insufficient documentation

## 2022-08-24 DIAGNOSIS — Z5111 Encounter for antineoplastic chemotherapy: Secondary | ICD-10-CM | POA: Diagnosis not present

## 2022-08-24 DIAGNOSIS — Z9481 Bone marrow transplant status: Secondary | ICD-10-CM | POA: Diagnosis not present

## 2022-08-24 DIAGNOSIS — K521 Toxic gastroenteritis and colitis: Secondary | ICD-10-CM | POA: Diagnosis not present

## 2022-08-24 DIAGNOSIS — Z9049 Acquired absence of other specified parts of digestive tract: Secondary | ICD-10-CM | POA: Diagnosis not present

## 2022-08-24 DIAGNOSIS — Z9484 Stem cells transplant status: Secondary | ICD-10-CM | POA: Insufficient documentation

## 2022-08-24 LAB — CMP (CANCER CENTER ONLY)
ALT: 7 U/L (ref 0–44)
AST: 19 U/L (ref 15–41)
Albumin: 4.3 g/dL (ref 3.5–5.0)
Alkaline Phosphatase: 96 U/L (ref 38–126)
Anion gap: 6 (ref 5–15)
BUN: 12 mg/dL (ref 8–23)
CO2: 26 mmol/L (ref 22–32)
Calcium: 9 mg/dL (ref 8.9–10.3)
Chloride: 104 mmol/L (ref 98–111)
Creatinine: 0.88 mg/dL (ref 0.61–1.24)
GFR, Estimated: >60 mL/min (ref >60)
Glucose, Bld: 94 mg/dL (ref 70–99)
Potassium: 3.7 mmol/L (ref 3.5–5.1)
Sodium: 136 mmol/L (ref 135–145)
Total Bilirubin: 0.4 mg/dL (ref 0.3–1.2)
Total Protein: 7.5 g/dL (ref 6.5–8.1)

## 2022-08-24 LAB — CBC WITH DIFFERENTIAL (CANCER CENTER ONLY)
Abs Immature Granulocytes: 0.02 10*3/uL (ref 0.00–0.07)
Basophils Absolute: 0.1 10*3/uL (ref 0.0–0.1)
Basophils Relative: 1 %
Eosinophils Absolute: 0.2 10*3/uL (ref 0.0–0.5)
Eosinophils Relative: 3 %
HCT: 39.9 % (ref 39.0–52.0)
Hemoglobin: 12.8 g/dL — ABNORMAL LOW (ref 13.0–17.0)
Immature Granulocytes: 0 %
Lymphocytes Relative: 23 %
Lymphs Abs: 1.7 10*3/uL (ref 0.7–4.0)
MCH: 30.5 pg (ref 26.0–34.0)
MCHC: 32.1 g/dL (ref 30.0–36.0)
MCV: 95 fL (ref 80.0–100.0)
Monocytes Absolute: 1 10*3/uL (ref 0.1–1.0)
Monocytes Relative: 13 %
Neutro Abs: 4.3 10*3/uL (ref 1.7–7.7)
Neutrophils Relative %: 60 %
Platelet Count: 288 10*3/uL (ref 150–400)
RBC: 4.2 MIL/uL — ABNORMAL LOW (ref 4.22–5.81)
RDW: 14.3 % (ref 11.5–15.5)
WBC Count: 7.3 10*3/uL (ref 4.0–10.5)
nRBC: 0 % (ref 0.0–0.2)

## 2022-08-24 MED ORDER — SODIUM CHLORIDE 0.9 % IV SOLN
Freq: Once | INTRAVENOUS | Status: AC
  Filled 2022-08-24: qty 250

## 2022-08-24 MED ORDER — ZOLEDRONIC ACID 4 MG/100ML IV SOLN
4.0000 mg | Freq: Once | INTRAVENOUS | Status: AC
  Administered 2022-08-24: 4 mg via INTRAVENOUS
  Filled 2022-08-24: qty 100

## 2022-08-24 NOTE — Assessment & Plan Note (Signed)
Chemotherapy plan as listed above 

## 2022-08-24 NOTE — Assessment & Plan Note (Signed)
secondary to carpal tunnel disease and chemotherapy Patient did not tolerate gabapentin.  Monitor symptoms-stable  

## 2022-08-24 NOTE — Assessment & Plan Note (Addendum)
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 Stable counts. [facility] myeloma/bone marrow transplant notes were reviewed. 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 MRD pending  maintenance with revlimid 10 mg on days 1-21 of 28 day cycle - proceed with this cycle Revlimid  vaccinations per protocol per on day 180 per [facility]  Zometa  - plan Q3 months to continue to 2 years of therapy. Recommend calcium 1200mg  daily with vitamin D supplementation.

## 2022-08-24 NOTE — Assessment & Plan Note (Signed)
Encourage oral hydration. Recommend Imodium as needed per instruction.

## 2022-08-24 NOTE — Assessment & Plan Note (Signed)
follow up with rheumatology.  Currently he is off Humira and methotrexate.  Symptom has been stable. 

## 2022-08-24 NOTE — Progress Notes (Signed)
Hematology/Oncology Progress note Telephone:([phone]) [phone] Fax:(336) [phone]      CHIEF COMPLAINTS/REASON FOR VISIT:  Follow-up for multiple myeloma   ASSESSMENT & PLAN:   Cancer Staging  Multiple myeloma not having achieved remission (HCC) Staging form: Plasma Cell Myeloma and Plasma Cell Disorders, AJCC 8th Edition - Clinical stage from 04/22/2020: RISS Stage II (Beta-2-microglobulin (mg/L): 2.7, Albumin (g/dL): 3.1, ISS: Stage II, High-risk cytogenetics: Absent, LDH: Normal) - Signed by Rickard Patience, MD on 08/10/2021   Multiple myeloma not having achieved remission (HCC) IgA lambda multiple myeloma, 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 Stable counts. [facility] myeloma/bone marrow transplant notes were reviewed. 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 MRD pending  maintenance with revlimid 10 mg on days 1-21 of 28 day cycle - proceed with this cycle Revlimid  vaccinations per protocol per on day 180 per [facility]  Zometa  - plan Q3 months to continue to 2 years of therapy. Recommend calcium 1200mg  daily with vitamin D supplementation.   Encounter for antineoplastic chemotherapy Chemotherapy plan as listed above.  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  Monitor symptoms-stable   Chemotherapy induced diarrhea Encourage oral hydration. Recommend Imodium as needed per instruction.  Rheumatoid arthritis (HCC) follow up with rheumatology.  Currently he is off Humira and methotrexate.  Symptom has been stable.   No orders of  the defined types were placed in this encounter.    Follow up in 4 weeks.  All questions were answered. The patient knows to call the clinic with any problems, questions or concerns.  Rickard Patience, MD, PhD [facility] Hematology Oncology 08/24/2022    HISTORY OF PRESENTING ILLNESS:  Eric Cruz is a 72 y.o. male presents for follow up of myeloma .  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  27% plasma cell, cytogenetics - normal, and myeloma FISH panel is negative.  Standard risk.  [place]ongo red staining was added and was negative.  - 07/27/2021, bone marrow biopsy showed hypercellular marrow involved by plasma cell myeloma, CD138 immunohistochemistry plasma cells compromise approximately 70% of the cellular elements and are lambda restricted by light chain in situ hybridization.  Cytogenetics showed duplication of 1q.  Cytogenetics is normal.   04/22/2020 Cancer Staging   Staging form: Plasma Cell Myeloma and Plasma Cell Disorders, AJCC 8th Edition - Clinical stage from 04/22/2020: RISS Stage II (Beta-2-microglobulin (mg/L): 2.7, Albumin (g/dL): 3.1, ISS: Stage II, High-risk cytogenetics: Absent, LDH: Normal) - Signed by Rickard Patience, MD on 08/10/2021 Stage prefix: Initial diagnosis Beta 2 microglobulin range (mg/L): Less than 3.5 Albumin range (g/dL): Less than 3.5 Cytogenetics: 1q addition Lactate dehydrogenase (LDH) (U/L): 136 Serum calcium level: Normal Serum creatinine level: Normal   08/12/2021 Imaging   PET scan showed 1. No evidence of active myeloma on skull base to thigh FDG PET scan. 2. No soft tissue plasmacytoma.3. No lytic or suspicious lesion on CT portion exam.     08/22/2021 -  Chemotherapy   Patient is on Treatment Plan : MYELOMA NEWLY DIAGNOSED TRANSPLANT CANDIDATE DaraVRd  (Daratumumab SQ) q21d x 6 Cycles (Induction/Consolidation)     08/24/2021 - 11/09/2021 Chemotherapy   Patient is on Treatment Plan : MYELOMA NEWLY DIAGNOSED TRANSPLANT CANDIDATE DaraVRd (Daratumumab SQ) q21d x 6 Cycles (Induction/Consolidation)     11/02/2021 Imaging   Bilateral lower extremity US negative for DVT   11/15/2021 Echocardiogram   1. Left ventricular ejection fraction, by estimation, is 60 to 65%. The left ventricle has normal function. The left ventricle has no regional wall motion abnormalities. Left ventricular diastolic parameters are consistent with Grade II diastolic dysfunction (pseudonormalization). The average left ventricular global longitudinal strain is -17.4 %.   2. Right ventricular systolic function is normal. The right ventricular size is normal. There is mildly elevated pulmonary artery systolic pressure. The estimated right ventricular systolic pressure is 37.7 mmHg.   3. The mitral valve is normal in structure. Mild mitral valve regurgitation. No evidence of mitral stenosis.  4. The aortic valve was not well visualized. Aortic valve regurgitation  is not visualized. No aortic stenosis is present.   5. The inferior vena cava is normal in size with greater than 50% respiratory variability, suggesting right atrial pressure of 3 mmHg.    01/23/2022 Bone Marrow Biopsy   Mildly hypercellular bone marrow (40%) with increased erythropoiesis and 2% plasma cells.    02/15/2022 Procedure   Status post autologous stem cell transplant at [facility] Preparative regimen with Melphalan 200 mg/m2 on 02/14/22 Infusion of stem cells - 6.7 x 10(6) on 02/15/22    06/13/2022 Procedure   Bone marrow biopsy at [facility] showed: Mildly hypercellular bone marrow (40%) with trilineage hematopoiesis and no increase in plasma cells. MRD pending    06/30/2022 -  Chemotherapy   Started on maintenance Revlimid 10mg  D1-21 Q28 days.     For rheumatoid arthritis, patient was seen  by Dr. Corliss Skains recently and is currently off methotrexate and Humira.  + Bilateral lower extremity swelling.[place] negative for DVT, rade II diastolic dysfunction one Echo  INTERVAL HISTORY Eric Cruz is a 72 y.o. male who has above history reviewed by me today presents for follow up visit for management of multiple myeloma Repeat bone marrow biopsy showed CR He reports feeling well.  He has started the cycles of Revlimid on 07/27/2022. Patient has noticed loose bowel movement episodes.  He has baseline of loose bowel movements after gallbladder removal.  He did noticed that he has more loose bowel movements when he is on malonate.  No other new complaints.    Review of Systems  Constitutional:  Negative for appetite change, chills, fatigue, fever and unexpected weight change.  HENT:   Negative for hearing loss and voice change.   Eyes:  Negative for eye problems and icterus.  Respiratory:  Negative for chest tightness, cough and shortness of breath.   Cardiovascular:  Negative for chest pain and leg swelling.  Gastrointestinal:  Negative for abdominal distention and abdominal pain.  Endocrine: Negative for hot flashes.  Genitourinary:  Negative for difficulty urinating, dysuria and frequency.   Musculoskeletal:  Positive for arthralgias and back pain.  Skin:  Negative for itching and rash.  Neurological:  Negative for light-headedness and numbness.  Hematological:  Negative for adenopathy. Does not bruise/bleed easily.  Psychiatric/Behavioral:  Negative for confusion.      MEDICAL HISTORY:  Past Medical History:  Diagnosis Date   Arthritis    BPH (benign  prostatic hyperplasia)    Cancer (HCC)    Prostate: states he had a positive biopsy but had another one a year later and it was gone.    Depression    Dizziness    GERD (gastroesophageal reflux disease)    Gout    High cholesterol    Hypertension    Sleep apnea    has cpap - not currently wearing   Varicose veins      SURGICAL HISTORY: Past Surgical History:  Procedure Laterality Date   ANTERIOR CERVICAL DECOMP/DISCECTOMY FUSION N/A 02/21/2018   Procedure: Cervical Five-Six Cervical Six-Seven Anterior cervical decompression/discectomy/fusion;  Surgeon: Coletta Memos, MD;  Location: [facility] OR;  Service: Neurosurgery;  Laterality: N/A;  Cervical Five-Six Cervical Six-Seven Anterior cervical decompression/discectomy/fusion   BIOPSY  10/09/2019   Procedure: BIOPSY;  Surgeon: Kerin Salen, MD;  Location: [facility] ENDOSCOPY;  Service: Gastroenterology;;   BLADDER SURGERY     CARPAL TUNNEL RELEASE Right 02/11/2019   Procedure: Right Carpal Tunnel Wound Irrigation;  Surgeon: Coletta Memos, MD;  Location: [facility] OR;  Service: Neurosurgery;  Laterality: Right;  Right Carpal tunnel wound exploration/wash out   CARPAL TUNNEL RELEASE Bilateral    CHOLECYSTECTOMY  11/22/2012   CHOLECYSTECTOMY  11/22/2012   Procedure: LAPAROSCOPIC CHOLECYSTECTOMY;  Surgeon: Cherylynn Ridges, MD;  [facility] OR;  Service: General;;   COLONOSCOPY     COLONOSCOPY WITH PROPOFOL N/A 10/09/2019   Procedure: COLONOSCOPY WITH PROPOFOL;  Surgeon: Kerin Salen, MD;  Location: [facility] ENDOSCOPY;  Service: Gastroenterology;  Laterality: N/A;   ESOPHAGOGASTRODUODENOSCOPY (EGD) WITH PROPOFOL N/A 10/09/2019   Procedure: ESOPHAGOGASTRODUODENOSCOPY (EGD) WITH PROPOFOL;  Surgeon: Kerin Salen, MD;  Location: [facility] ENDOSCOPY;  Service: Gastroenterology;  Laterality: N/A;   POLYPECTOMY  10/09/2019   Procedure: POLYPECTOMY;  Surgeon: Kerin Salen, MD;  Location: [facility] ENDOSCOPY;  Service: Gastroenterology;;   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    SOCIAL HISTORY: Social History   Socioeconomic History   Marital status: Married    Spouse name: Not on file   Number of children: 2   Years of education: Not on file   Highest education level: Not on file  Occupational History   Occupation: Retired-mechanic  Tobacco Use   Smoking status: Former    Packs/day: 1.00    Years: 31.00     Additional pack years: 0.00    Total pack years: 31.00    Types: Cigarettes    Quit date: 04/12/1998    Years since quitting: 24.3    Passive exposure: Never   Smokeless tobacco: Never  Vaping Use   Vaping Use: Never used  Substance and Sexual Activity   Alcohol use: No   Drug use: No   Sexual activity: Not on file  Other Topics Concern   Not on file  Social History Narrative   Not on file   Social Determinants of Health   Financial Resource Strain: Not on file  Food Insecurity: Not on file  Transportation Needs: Not on file  Physical Activity: Not on file  Stress: Not on file  Social Connections: Not on file  Intimate Partner Violence: Not on file    FAMILY HISTORY: Family History  Problem Relation Age of Onset   Hypertension Mother    Diabetes Mother    Multiple myeloma Mother    Hypertension Father    Diabetes Father    Healthy Son    Healthy Daughter     ALLERGIES:  is allergic to atorvastatin, simvastatin, other, and statins.  MEDICATIONS:  Current Outpatient  Medications  Medication Sig Dispense Refill   acyclovir (ZOVIRAX) 800 MG tablet Take 800 mg by mouth 2 (two) times daily.     aspirin EC 81 MG tablet Take 81 mg by mouth daily. Swallow whole.     Cholecalciferol (VITAMIN D3) 1000 units CAPS Take 1 capsule by mouth daily.     folic acid (FOLVITE) 1 MG tablet TAKE 2 TABLETS BY MOUTH DAILY 180 tablet 3   furosemide (LASIX) 40 MG tablet Take 1 tablet (40 mg total) by mouth daily. 90 tablet 3   lenalidomide (REVLIMID) 10 MG capsule Take 1 capsule (10 mg total) by mouth daily. Take for 21 days, then hold for 7 days. Repeat every 28 days. 21 capsule 0   Multiple Vitamin (MULTIVITAMIN ADULT PO) Take by mouth.     tamsulosin (FLOMAX) 0.4 MG CAPS capsule Take 0.4 mg by mouth daily after supper.     acyclovir (ZOVIRAX) 400 MG tablet Take 800 mg by mouth 2 (two) times daily. (Patient not taking: Reported on 07/27/2022)     loperamide (IMODIUM) 2 MG capsule Take  1 capsule (2 mg total) by mouth See admin instructions. As needed for diarrhea, Initial: 4 mg,the 2 mg every 2 hours (4 mg every 4 hours at night) until diarrhea stops. maximum: 16 mg/day (Patient not taking: Reported on 08/24/2022) 60 capsule 2   No current facility-administered medications for this visit.   Facility-Administered Medications Ordered in Other Visits  Medication Dose Route Frequency Provider Last Rate Last Admin   acetaminophen (TYLENOL) 325 MG tablet            dexamethasone (DECADRON) 4 MG tablet            diphenhydrAMINE (BENADRYL) 25 mg capsule              PHYSICAL EXAMINATION: ECOG PERFORMANCE STATUS: 1 - Symptomatic but completely ambulatory Vitals:   08/24/22 1401  BP: 122/86  Pulse: 82  Resp: 18  Temp: (!) 97.1 F (36.2 C)   Filed Weights   08/24/22 1401  Weight: (!) 301 lb 6.4 oz (136.7 kg)    Physical Exam Constitutional:      General: He is not in acute distress.    Appearance: He is obese.     Comments: Patient walks with a cane.  HENT:     Head: Normocephalic and atraumatic.  Eyes:     General: No scleral icterus. Cardiovascular:     Rate and Rhythm: Normal rate and regular rhythm.     Heart sounds: Normal heart sounds.  Pulmonary:     Effort: Pulmonary effort is normal. No respiratory distress.     Breath sounds: No wheezing.  Abdominal:     General: Bowel sounds are normal. There is no distension.     Palpations: Abdomen is soft.  Musculoskeletal:        General: No deformity. Normal range of motion.     Cervical back: Normal range of motion and neck supple.     Comments: Trace edema, bilateral low extremities.   Skin:    General: Skin is warm and dry.     Findings: No erythema or rash.  Neurological:     Mental Status: He is alert and oriented to person, place, and time. Mental status is at baseline.     Cranial Nerves: No cranial nerve deficit.     Coordination: Coordination normal.  Psychiatric:        Mood and Affect: Mood  normal.  LABORATORY DATA:  I have reviewed the data as listed Lab Results  Component Value Date   WBC 7.3 08/24/2022   HGB 12.8 (L) 08/24/2022   HCT 39.9 08/24/2022   MCV 95.0 08/24/2022   PLT 288 08/24/2022   Recent Labs    07/06/22 1042 07/28/22 1132 08/24/22 1322  NA 136 135 136  K 3.7 3.8 3.7  CL 104 104 104  CO2 23 23 26   GLUCOSE 105* 105* 94  BUN 11 17 12   CREATININE 1.12 0.87 0.88  CALCIUM 9.1 9.2 9.0  GFRNONAA >60 >60 >60  PROT 7.4 7.3 7.5  ALBUMIN 4.5 4.3 4.3  AST 18 18 19   ALT 8 9 7   ALKPHOS 97 100 96  BILITOT 0.3 0.4 0.4    Iron/TIBC/Ferritin/ %Sat    Component Value Date/Time   IRON 78 04/05/2021 1008   TIBC 274 04/05/2021 1008   FERRITIN 425 (H) 04/05/2021 1008   IRONPCTSAT 28 04/05/2021 1008      RADIOGRAPHIC STUDIES: I have personally reviewed the radiological images as listed and agreed with the findings in the report.  No results found.

## 2022-08-25 LAB — KAPPA/LAMBDA LIGHT CHAINS
Kappa free light chain: 17.4 mg/L (ref 3.3–19.4)
Kappa, lambda light chain ratio: 1.33 (ref 0.26–1.65)
Lambda free light chains: 13.1 mg/L (ref 5.7–26.3)

## 2022-08-31 DIAGNOSIS — I110 Hypertensive heart disease with heart failure: Secondary | ICD-10-CM | POA: Diagnosis not present

## 2022-08-31 DIAGNOSIS — Z0000 Encounter for general adult medical examination without abnormal findings: Secondary | ICD-10-CM | POA: Diagnosis not present

## 2022-08-31 DIAGNOSIS — G620 Drug-induced polyneuropathy: Secondary | ICD-10-CM | POA: Diagnosis not present

## 2022-08-31 DIAGNOSIS — E7800 Pure hypercholesterolemia, unspecified: Secondary | ICD-10-CM | POA: Diagnosis not present

## 2022-08-31 DIAGNOSIS — I5032 Chronic diastolic (congestive) heart failure: Secondary | ICD-10-CM | POA: Diagnosis not present

## 2022-08-31 DIAGNOSIS — M0579 Rheumatoid arthritis with rheumatoid factor of multiple sites without organ or systems involvement: Secondary | ICD-10-CM | POA: Diagnosis not present

## 2022-08-31 DIAGNOSIS — C9000 Multiple myeloma not having achieved remission: Secondary | ICD-10-CM | POA: Diagnosis not present

## 2022-09-05 LAB — MULTIPLE MYELOMA PANEL, SERUM
Albumin SerPl Elph-Mcnc: 3.7 g/dL (ref 2.9–4.4)
Albumin/Glob SerPl: 1.4 (ref 0.7–1.7)
Alpha 1: 0.3 g/dL (ref 0.0–0.4)
Alpha2 Glob SerPl Elph-Mcnc: 0.7 g/dL (ref 0.4–1.0)
B-Globulin SerPl Elph-Mcnc: 1.2 g/dL (ref 0.7–1.3)
Gamma Glob SerPl Elph-Mcnc: 0.5 g/dL (ref 0.4–1.8)
Globulin, Total: 2.7 g/dL (ref 2.2–3.9)
IgA: 58 mg/dL — ABNORMAL LOW (ref 61–437)
IgG (Immunoglobin G), Serum: 685 mg/dL (ref 603–1613)
IgM (Immunoglobulin M), Srm: 22 mg/dL (ref 20–172)
Total Protein ELP: 6.4 g/dL (ref 6.0–8.5)

## 2022-09-12 DIAGNOSIS — R3915 Urgency of urination: Secondary | ICD-10-CM | POA: Diagnosis not present

## 2022-09-14 ENCOUNTER — Other Ambulatory Visit: Payer: Self-pay

## 2022-09-14 DIAGNOSIS — C9000 Multiple myeloma not having achieved remission: Secondary | ICD-10-CM

## 2022-09-14 MED ORDER — LENALIDOMIDE 10 MG PO CAPS
10.0000 mg | ORAL_CAPSULE | Freq: Every day | ORAL | 0 refills | Status: DC
Start: 2022-09-14 — End: 2022-10-13

## 2022-09-21 ENCOUNTER — Inpatient Hospital Stay: Payer: Medicare Other | Admitting: Oncology

## 2022-09-21 ENCOUNTER — Inpatient Hospital Stay: Payer: Medicare Other | Attending: Oncology

## 2022-09-21 ENCOUNTER — Encounter: Payer: Self-pay | Admitting: Oncology

## 2022-09-21 VITALS — BP 133/70 | HR 64 | Temp 96.3°F | Resp 18 | Wt 301.9 lb

## 2022-09-21 DIAGNOSIS — Z5111 Encounter for antineoplastic chemotherapy: Secondary | ICD-10-CM | POA: Diagnosis not present

## 2022-09-21 DIAGNOSIS — Z79624 Long term (current) use of inhibitors of nucleotide synthesis: Secondary | ICD-10-CM | POA: Diagnosis not present

## 2022-09-21 DIAGNOSIS — Z79899 Other long term (current) drug therapy: Secondary | ICD-10-CM | POA: Diagnosis not present

## 2022-09-21 DIAGNOSIS — Z87891 Personal history of nicotine dependence: Secondary | ICD-10-CM | POA: Diagnosis not present

## 2022-09-21 DIAGNOSIS — G629 Polyneuropathy, unspecified: Secondary | ICD-10-CM | POA: Diagnosis not present

## 2022-09-21 DIAGNOSIS — C9000 Multiple myeloma not having achieved remission: Secondary | ICD-10-CM | POA: Diagnosis not present

## 2022-09-21 DIAGNOSIS — K521 Toxic gastroenteritis and colitis: Secondary | ICD-10-CM

## 2022-09-21 DIAGNOSIS — Z7982 Long term (current) use of aspirin: Secondary | ICD-10-CM | POA: Diagnosis not present

## 2022-09-21 DIAGNOSIS — Z7961 Long term (current) use of immunomodulator: Secondary | ICD-10-CM | POA: Diagnosis not present

## 2022-09-21 LAB — CBC WITH DIFFERENTIAL/PLATELET
Abs Immature Granulocytes: 0.01 10*3/uL (ref 0.00–0.07)
Basophils Absolute: 0.1 10*3/uL (ref 0.0–0.1)
Basophils Relative: 1 %
Eosinophils Absolute: 0.1 10*3/uL (ref 0.0–0.5)
Eosinophils Relative: 2 %
HCT: 39.1 % (ref 39.0–52.0)
Hemoglobin: 12.8 g/dL — ABNORMAL LOW (ref 13.0–17.0)
Immature Granulocytes: 0 %
Lymphocytes Relative: 21 %
Lymphs Abs: 1.2 10*3/uL (ref 0.7–4.0)
MCH: 30.8 pg (ref 26.0–34.0)
MCHC: 32.7 g/dL (ref 30.0–36.0)
MCV: 94 fL (ref 80.0–100.0)
Monocytes Absolute: 0.7 10*3/uL (ref 0.1–1.0)
Monocytes Relative: 13 %
Neutro Abs: 3.5 10*3/uL (ref 1.7–7.7)
Neutrophils Relative %: 63 %
Platelets: 248 10*3/uL (ref 150–400)
RBC: 4.16 MIL/uL — ABNORMAL LOW (ref 4.22–5.81)
RDW: 15.3 % (ref 11.5–15.5)
WBC: 5.6 10*3/uL (ref 4.0–10.5)
nRBC: 0 % (ref 0.0–0.2)

## 2022-09-21 LAB — COMPREHENSIVE METABOLIC PANEL
ALT: 9 U/L (ref 0–44)
AST: 18 U/L (ref 15–41)
Albumin: 4.3 g/dL (ref 3.5–5.0)
Alkaline Phosphatase: 90 U/L (ref 38–126)
Anion gap: 10 (ref 5–15)
BUN: 15 mg/dL (ref 8–23)
CO2: 23 mmol/L (ref 22–32)
Calcium: 9.2 mg/dL (ref 8.9–10.3)
Chloride: 106 mmol/L (ref 98–111)
Creatinine, Ser: 1.09 mg/dL (ref 0.61–1.24)
GFR, Estimated: >60 mL/min (ref >60)
Glucose, Bld: 102 mg/dL — ABNORMAL HIGH (ref 70–99)
Potassium: 4.1 mmol/L (ref 3.5–5.1)
Sodium: 139 mmol/L (ref 135–145)
Total Bilirubin: 0.6 mg/dL (ref 0.3–1.2)
Total Protein: 7.4 g/dL (ref 6.5–8.1)

## 2022-09-21 NOTE — Progress Notes (Signed)
Hematology/Oncology Progress note [phone] Fax:([phone]) [phone]      CHIEF COMPLAINTS/REASON FOR VISIT:  Follow-up for multiple myeloma   ASSESSMENT & PLAN:   Cancer Staging  Multiple myeloma not having achieved remission (HCC) Staging form: Plasma Cell Myeloma and Plasma Cell Disorders, AJCC 8th Edition - Clinical stage from 04/22/2020: RISS Stage II (Beta-2-microglobulin (mg/L): 2.7, Albumin (g/dL): 3.1, ISS: Stage II, High-risk cytogenetics: Absent, LDH: Normal) - Signed by Rickard Patience, MD on 08/10/2021   Multiple myeloma not having achieved remission (HCC) IgA lambda multiple myeloma, 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 Stable counts. [facility] myeloma/bone marrow transplant notes were reviewed. 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 MRD pending  maintenance with revlimid 10 mg on days 1-21 of 28 day cycle - proceed with this cycle Revlimid  vaccinations per protocol per on day 180 per [facility]  Zometa  - plan Q3 months to continue to 2 years of therapy. -next due August 2024 Recommend calcium 1200mg  daily with vitamin D supplementation.   Encounter for antineoplastic chemotherapy Chemotherapy plan as listed above.  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  Monitor symptoms-stable    Orders Placed This Encounter  Procedures   CBC with Differential (Cancer Center Only)    Standing Status:   Future    Standing Expiration Date:   09/21/2023   CMP (Cancer Center only)    Standing Status:   Future    Standing  Expiration Date:   09/21/2023   Kappa/lambda light chains    Standing Status:   Future    Standing Expiration Date:   09/21/2023   Multiple Myeloma Panel (SPEP&IFE w/QIG)    Standing Status:   Future    Standing Expiration Date:   09/21/2023    Follow up in 4 weeks.  All questions were answered. The patient knows to call the clinic with any problems, questions or concerns.  Rickard Patience, MD, PhD [facility] Hematology Oncology 09/21/2022    HISTORY OF PRESENTING ILLNESS:  Eric Cruz is a 72 y.o. male presents for follow up of myeloma .  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  27% plasma cell, cytogenetics - normal, and myeloma FISH panel is negative.  Standard risk.  [place] red staining was added and was negative.  - 07/27/2021, bone marrow biopsy showed hypercellular marrow involved by plasma cell myeloma, CD138 immunohistochemistry plasma cells compromise approximately 70% of the cellular elements and are lambda restricted by light chain in situ hybridization.  Cytogenetics showed duplication of 1q.  Cytogenetics is normal.   04/22/2020 Cancer Staging   Staging form: Plasma Cell Myeloma and Plasma Cell Disorders, AJCC 8th Edition - Clinical stage from 04/22/2020: RISS Stage II (Beta-2-microglobulin (mg/L): 2.7, Albumin (g/dL): 3.1, ISS: Stage II, High-risk cytogenetics: Absent, LDH: Normal) - Signed by Rickard Patience, MD on 08/10/2021 Stage prefix: Initial diagnosis Beta 2 microglobulin range (mg/L): Less than 3.5 Albumin range (g/dL): Less than 3.5  Cytogenetics: 1q addition Lactate dehydrogenase (LDH) (U/L): 136 Serum calcium level: Normal Serum creatinine level: Normal   08/12/2021 Imaging   PET scan showed 1. No evidence of active myeloma on skull base to thigh FDG PET scan. 2. No soft  tissue plasmacytoma.3. No lytic or suspicious lesion on CT portion exam.     08/22/2021 -  Chemotherapy   Patient is on Treatment Plan : MYELOMA NEWLY DIAGNOSED TRANSPLANT CANDIDATE DaraVRd (Daratumumab SQ) q21d x 6 Cycles (Induction/Consolidation)     08/24/2021 - 11/09/2021 Chemotherapy   Patient is on Treatment Plan : MYELOMA NEWLY DIAGNOSED TRANSPLANT CANDIDATE DaraVRd (Daratumumab SQ) q21d x 6 Cycles (Induction/Consolidation)     11/02/2021 Imaging   Bilateral lower extremity US negative for DVT   11/15/2021 Echocardiogram   1. Left ventricular ejection fraction, by estimation, is 60 to 65%. The left ventricle has normal function. The left ventricle has no regional wall motion abnormalities. Left ventricular diastolic parameters are consistent with Grade II diastolic dysfunction (pseudonormalization). The average left ventricular global longitudinal strain is -17.4 %.   2. Right ventricular systolic function is normal. The right ventricular size is normal. There is mildly elevated pulmonary artery systolic pressure. The estimated right ventricular systolic pressure is 37.7 mmHg.   3. The mitral valve is normal in structure. Mild mitral valve regurgitation. No evidence of mitral stenosis.  4. The aortic valve was not well visualized. Aortic valve regurgitation  is not visualized. No aortic stenosis is present.   5. The inferior vena cava is normal in size with greater than 50% respiratory variability, suggesting right atrial pressure of 3 mmHg.    01/23/2022 Bone Marrow Biopsy   Mildly hypercellular bone marrow (40%) with increased erythropoiesis and 2% plasma cells.    02/15/2022 Procedure   Status post autologous stem cell transplant at [facility] Preparative regimen with Melphalan 200 mg/m2 on 02/14/22 Infusion of stem cells - 6.7 x 10(6) on 02/15/22    06/13/2022 Procedure   Bone marrow biopsy at [facility] showed: Mildly hypercellular bone marrow (40%) with trilineage  hematopoiesis and no increase in plasma cells. MRD pending    06/30/2022 -  Chemotherapy   Started on maintenance Revlimid 10mg  D1-21 Q28 days.     For rheumatoid arthritis, patient was seen by Dr. Corliss Skains recently and is currently off methotrexate and Humira.  + Bilateral lower extremity swelling.[place] negative for DVT, rade II diastolic dysfunction one Echo  INTERVAL HISTORY Eric Cruz is a 72 y.o. male who has above history reviewed by me today presents for follow up visit for management of multiple myeloma Repeat bone marrow biopsy showed CR He reports feeling well.  He followed up with urologist recently. Per patient his PSA, he is waiting or his urologist to call him.     Review of Systems  Constitutional:  Negative for appetite change, chills, fatigue, fever and unexpected weight change.  HENT:   Negative for hearing loss and voice change.   Eyes:  Negative for eye problems and icterus.  Respiratory:  Negative for chest tightness, cough and shortness of breath.   Cardiovascular:  Negative for chest pain and leg swelling.  Gastrointestinal:  Negative for abdominal distention and abdominal pain.  Endocrine: Negative for hot flashes.  Genitourinary:  Negative for difficulty urinating, dysuria and frequency.   Musculoskeletal:  Positive for arthralgias and back pain.  Skin:  Negative for itching and rash.  Neurological:  Negative for light-headedness and numbness.  Hematological:  Negative for adenopathy.  Does not bruise/bleed easily.  Psychiatric/Behavioral:  Negative for confusion.      MEDICAL HISTORY:  Past Medical History:  Diagnosis Date   Arthritis    BPH (benign prostatic hyperplasia)    Cancer (HCC)    Prostate: states he had a positive biopsy but had another one a year later and it was gone.    Depression    Dizziness    GERD (gastroesophageal reflux disease)    Gout    High cholesterol    Hypertension    Sleep apnea    has cpap - not currently  wearing   Varicose veins     SURGICAL HISTORY: Past Surgical History:  Procedure Laterality Date   ANTERIOR CERVICAL DECOMP/DISCECTOMY FUSION N/A 02/21/2018   Procedure: Cervical Five-Six Cervical Six-Seven Anterior cervical decompression/discectomy/fusion;  Surgeon: Coletta Memos, MD;  Location: [facility] OR;  Service: Neurosurgery;  Laterality: N/A;  Cervical Five-Six Cervical Six-Seven Anterior cervical decompression/discectomy/fusion   BIOPSY  10/09/2019   Procedure: BIOPSY;  Surgeon: Kerin Salen, MD;  Location: [facility] ENDOSCOPY;  Service: Gastroenterology;;   BLADDER SURGERY     CARPAL TUNNEL RELEASE Right 02/11/2019   Procedure: Right Carpal Tunnel Wound Irrigation;  Surgeon: Coletta Memos, MD;  Location: [facility] OR;  Service: Neurosurgery;  Laterality: Right;  Right Carpal tunnel wound exploration/wash out   CARPAL TUNNEL RELEASE Bilateral    CHOLECYSTECTOMY  11/22/2012   CHOLECYSTECTOMY  11/22/2012   Procedure: LAPAROSCOPIC CHOLECYSTECTOMY;  Surgeon: Cherylynn Ridges, MD;  Location: [facility] OR;  Service: General;;   COLONOSCOPY     COLONOSCOPY WITH PROPOFOL N/A 10/09/2019   Procedure: COLONOSCOPY WITH PROPOFOL;  Surgeon: Kerin Salen, MD;  Location: [facility];  Service: Gastroenterology;  Laterality: N/A;   ESOPHAGOGASTRODUODENOSCOPY (EGD) WITH PROPOFOL N/A 10/09/2019   Procedure: ESOPHAGOGASTRODUODENOSCOPY (EGD) WITH PROPOFOL;  Surgeon: Kerin Salen, MD;  Location: [facility];  Service: Gastroenterology;  Laterality: N/A;   POLYPECTOMY  10/09/2019   Procedure: POLYPECTOMY;  Surgeon: Kerin Salen, MD;  Location: [facility]COPY;  Service: Gastroenterology;;   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    SOCIAL HISTORY: Social History   Socioeconomic History   Marital status: Married    Spouse name: Not on file   Number of children: 2   Years of education: Not on file   Highest education level: Not on file  Occupational History   Occupation: Retired-mechanic  Tobacco Use   Smoking status: Former     Packs/day: 1.00    Years: 31.00    Additional pack years: 0.00    Total pack years: 31.00    Types: Cigarettes    Quit date: 04/12/1998    Years since quitting: 24.4    Passive exposure: Never   Smokeless tobacco: Never  Vaping Use   Vaping Use: Never used  Substance and Sexual Activity   Alcohol use: No   Drug use: No   Sexual activity: Not on file  Other Topics Concern   Not on file  Social History Narrative   Not on file   Social Determinants of Health   Financial Resource Strain: Not on file  Food Insecurity: Not on file  Transportation Needs: Not on file  Physical Activity: Not on file  Stress: Not on file  Social Connections: Not on file  Intimate Partner Violence: Not on file    FAMILY HISTORY: Family History  Problem Relation Age of Onset   Hypertension Mother    Diabetes Mother    Multiple myeloma Mother    Hypertension Father  Diabetes Father    Healthy Son    Healthy Daughter     ALLERGIES:  is allergic to atorvastatin, simvastatin, other, and statins.  MEDICATIONS:  Current Outpatient Medications  Medication Sig Dispense Refill   acyclovir (ZOVIRAX) 800 MG tablet Take 800 mg by mouth 2 (two) times daily.     aspirin EC 81 MG tablet Take 81 mg by mouth daily. Swallow whole.     Cholecalciferol (VITAMIN D3) 1000 units CAPS Take 1 capsule by mouth daily.     ferrous sulfate 325 (65 FE) MG EC tablet Take 325 mg by mouth 3 (three) times daily with meals.     folic acid (FOLVITE) 1 MG tablet TAKE 2 TABLETS BY MOUTH DAILY 180 tablet 3   furosemide (LASIX) 40 MG tablet Take 1 tablet (40 mg total) by mouth daily. 90 tablet 3   lenalidomide (REVLIMID) 10 MG capsule Take 1 capsule (10 mg total) by mouth daily. Take for 21 days, then hold for 7 days. Repeat every 28 days. 21 capsule 0   Multiple Vitamin (MULTIVITAMIN ADULT PO) Take by mouth.     tamsulosin (FLOMAX) 0.4 MG CAPS capsule Take 0.4 mg by mouth daily after supper.     acyclovir (ZOVIRAX) 400 MG  tablet Take 800 mg by mouth 2 (two) times daily. (Patient not taking: Reported on 07/27/2022)     loperamide (IMODIUM) 2 MG capsule Take 1 capsule (2 mg total) by mouth See admin instructions. As needed for diarrhea, Initial: 4 mg,the 2 mg every 2 hours (4 mg every 4 hours at night) until diarrhea stops. maximum: 16 mg/day (Patient not taking: Reported on 08/24/2022) 60 capsule 2   No current facility-administered medications for this visit.   Facility-Administered Medications Ordered in Other Visits  Medication Dose Route Frequency Provider Last Rate Last Admin   acetaminophen (TYLENOL) 325 MG tablet            dexamethasone (DECADRON) 4 MG tablet            diphenhydrAMINE (BENADRYL) 25 mg capsule              PHYSICAL EXAMINATION: ECOG PERFORMANCE STATUS: 1 - Symptomatic but completely ambulatory Vitals:   09/21/22 1059  BP: 133/70  Pulse: 64  Resp: 18  Temp: (!) 96.3 F (35.7 C)  SpO2: 99%   Filed Weights   09/21/22 1059  Weight: (!) 301 lb 14.4 oz (136.9 kg)    Physical Exam Constitutional:      General: He is not in acute distress.    Appearance: He is obese.     Comments: Patient walks with a cane.  HENT:     Head: Normocephalic and atraumatic.  Eyes:     General: No scleral icterus. Cardiovascular:     Rate and Rhythm: Normal rate and regular rhythm.     Heart sounds: Normal heart sounds.  Pulmonary:     Effort: Pulmonary effort is normal. No respiratory distress.     Breath sounds: No wheezing.  Abdominal:     General: Bowel sounds are normal. There is no distension.     Palpations: Abdomen is soft.  Musculoskeletal:        General: No deformity. Normal range of motion.     Cervical back: Normal range of motion and neck supple.     Comments: Trace edema, bilateral low extremities.   Skin:    General: Skin is warm and dry.     Findings: No erythema or rash.  Neurological:     Mental Status: He is alert and oriented to person, place, and time. Mental status  is at baseline.     Cranial Nerves: No cranial nerve deficit.     Coordination: Coordination normal.  Psychiatric:        Mood and Affect: Mood normal.      LABORATORY DATA:  I have reviewed the data as listed Lab Results  Component Value Date   WBC 5.6 09/21/2022   HGB 12.8 (L) 09/21/2022   HCT 39.1 09/21/2022   MCV 94.0 09/21/2022   PLT 248 09/21/2022   Recent Labs    07/28/22 1132 08/24/22 1322 09/21/22 1027  NA 135 136 139  K 3.8 3.7 4.1  CL 104 104 106  CO2 23 26 23   GLUCOSE 105* 94 102*  BUN 17 12 15   CREATININE 0.87 0.88 1.09  CALCIUM 9.2 9.0 9.2  GFRNONAA >60 >60 >60  PROT 7.3 7.5 7.4  ALBUMIN 4.3 4.3 4.3  AST 18 19 18   ALT 9 7 9   ALKPHOS 100 96 90  BILITOT 0.4 0.4 0.6    Iron/TIBC/Ferritin/ %Sat    Component Value Date/Time   IRON 78 04/05/2021 1008   TIBC 274 04/05/2021 1008   FERRITIN 425 (H) 04/05/2021 1008   IRONPCTSAT 28 04/05/2021 1008      RADIOGRAPHIC STUDIES: I have personally reviewed the radiological images as listed and agreed with the findings in the report.  No results found.

## 2022-09-21 NOTE — Assessment & Plan Note (Signed)
secondary to carpal tunnel disease and chemotherapy Patient did not tolerate gabapentin.  Monitor symptoms-stable  

## 2022-09-21 NOTE — Assessment & Plan Note (Signed)
Chemotherapy plan as listed above 

## 2022-09-21 NOTE — Assessment & Plan Note (Addendum)
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 Stable counts. [facility] myeloma/bone marrow transplant notes were reviewed. 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 MRD pending  maintenance with revlimid 10 mg on days 1-21 of 28 day cycle - proceed with this cycle Revlimid  vaccinations per protocol per on day 180 per [facility]  Zometa  - plan Q3 months to continue to 2 years of therapy. -next due August 2024 Recommend calcium 1200mg  daily with vitamin D supplementation.

## 2022-09-22 LAB — KAPPA/LAMBDA LIGHT CHAINS
Kappa free light chain: 13.5 mg/L (ref 3.3–19.4)
Kappa, lambda light chain ratio: 1.09 (ref 0.26–1.65)
Lambda free light chains: 12.4 mg/L (ref 5.7–26.3)

## 2022-09-26 LAB — MULTIPLE MYELOMA PANEL, SERUM
Albumin SerPl Elph-Mcnc: 3.9 g/dL (ref 2.9–4.4)
Albumin/Glob SerPl: 1.4 (ref 0.7–1.7)
Alpha 1: 0.3 g/dL (ref 0.0–0.4)
Alpha2 Glob SerPl Elph-Mcnc: 0.7 g/dL (ref 0.4–1.0)
B-Globulin SerPl Elph-Mcnc: 1.3 g/dL (ref 0.7–1.3)
Gamma Glob SerPl Elph-Mcnc: 0.7 g/dL (ref 0.4–1.8)
Globulin, Total: 2.9 g/dL (ref 2.2–3.9)
IgA: 75 mg/dL (ref 61–437)
IgG (Immunoglobin G), Serum: 799 mg/dL (ref 603–1613)
IgM (Immunoglobulin M), Srm: 25 mg/dL (ref 20–172)
Total Protein ELP: 6.8 g/dL (ref 6.0–8.5)

## 2022-10-09 NOTE — Progress Notes (Addendum)
Office Visit    Patient Name: Eric Cruz Date of Encounter: 10/10/2022  Primary Care Provider:  Renford Dills, MD Primary Cardiologist:  Verne Carrow, MD Primary Electrophysiologist: None   Past Medical History    Past Medical History:  Diagnosis Date   Arthritis    BPH (benign prostatic hyperplasia)    Cancer [facility])    Prostate: states he had a positive biopsy but had another one a year later and it was gone.    Depression    Dizziness    GERD (gastroesophageal reflux disease)    Gout    High cholesterol    Hypertension    Sleep apnea    has cpap - not currently wearing   Varicose veins    Past Surgical History:  Procedure Laterality Date   ANTERIOR CERVICAL DECOMP/DISCECTOMY FUSION N/A 02/21/2018   Procedure: Cervical Five-Six Cervical Six-Seven Anterior cervical decompression/discectomy/fusion;  Surgeon: Coletta Memos, MD;  Location: [facility] OR;  Service: Neurosurgery;  Laterality: N/A;  Cervical Five-Six Cervical Six-Seven Anterior cervical decompression/discectomy/fusion   BIOPSY  10/09/2019   Procedure: BIOPSY;  Surgeon: Kerin Salen, MD;  Location: [facility] END[facility]COPY;  Service: Gastroenterology;;   BLADDER SURGERY     CARPAL TUNNEL RELEASE Right 02/11/2019   Procedure: Right Carpal Tunnel Wound Irrigation;  Surgeon: Coletta Memos, MD;  Location: [facility] OR;  Service: Neurosurgery;  Laterality: Right;  Right Carpal tunnel wound exploration/wash out   CARPAL TUNNEL RELEASE Bilateral    CHOLECYSTECTOMY  11/22/2012   CHOLECYSTECTOMY  11/22/2012   Procedure: LAPAROSCOPIC CHOLECYSTECTOMY;  Surgeon: Cherylynn Ridges, MD;  Location: [facility] OR;  Service: General;;   COLONOSCOPY     COLONOSCOPY WITH PROPOFOL N/A 10/09/2019   Procedure: COLONOSCOPY WITH PROPOFOL;  Surgeon: Kerin Salen, MD;  Location: [facility];  Service: Gastroenterology;  Laterality: N/A;   ESOPHAGOGASTRODUODENOSCOPY (EGD) WITH PROPOFOL N/A 10/09/2019   Procedure: ESOPHAGOGASTRODUODENOSCOPY (EGD) WITH PROPOFOL;   Surgeon: Kerin Salen, MD;  Location: [facility];  Service: Gastroenterology;  Laterality: N/A;   POLYPECTOMY  10/09/2019   Procedure: POLYPECTOMY;  Surgeon: Kerin Salen, MD;  Location: [facility];  Service: Gastroenterology;;   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    Allergies  Allergies  Allergen Reactions   Atorvastatin     Other reaction(s): Unknown   Simvastatin     Arms ache Other reaction(s): Unknown   Other     Can only tolerate oxygen on a low level   Statins     Arms ache     History of Present Illness    Eric Cruz  is a 72 year old male with a PMH of chronic diastolic CHF, tobacco abuse, GERD, HLD, HTN, rheumatoid arthritis, prostate CA, multiple myeloma who presents today for follow-up.  Eric Cruz was seen initially by Dr. Clifton James on 12/2021 for evaluation of lower extremity edema.  He noticed lower extremity edema over the past few months and was currently being treated with chemotherapy for multiple myeloma.  2D echo was completed 12/2021 with EF of 60-65% with no RWMA and normal RV function with mild MR.  He was started on Lasix 40 mg daily and plan to repeat 2D echo in 3 years due to mild MVR. He is still undergoing chemotherapy at this time.  Eric Cruz presents today for 19-month follow-up.  Since last being seen in the office patient reports he has been doing well with no new cardiac complaints since his previous visit.  He is euvolemic on exam today with trace lower  extremity edema present.  His blood pressure today is controlled at 118/66 and heart rate was 98 bpm.  He is still undergoing treatment with stem cell replacement and has tolerated treatment without any issues or concerns.  He does endorse occasional twinges of fleeting chest pain that is associated with little to no activity.  He is staying active and cut his yard with a push mower yesterday without any chest pain or shortness of breath.  He has been able to lie flat without any orthopnea and has  continued to use his Lasix and other medications without any adverse reactions.  Patient denies chest pain, palpitations, dyspnea, PND, orthopnea, nausea, vomiting, dizziness, syncope, edema, weight gain, or early satiety.   Current Outpatient Medications  Medication Sig Dispense Refill   acyclovir (ZOVIRAX) 800 MG tablet Take 800 mg by mouth 2 (two) times daily.     aspirin EC 81 MG tablet Take 81 mg by mouth daily. Swallow whole.     Cholecalciferol (VITAMIN D3) 125 MCG (5000 UT) CAPS Take 1 capsule by mouth daily.     ferrous sulfate 325 (65 FE) MG EC tablet Take 325 mg by mouth 3 (three) times daily with meals.     folic acid (FOLVITE) 1 MG tablet TAKE 2 TABLETS BY MOUTH DAILY 180 tablet 3   furosemide (LASIX) 40 MG tablet Take 1 tablet (40 mg total) by mouth daily. 90 tablet 3   lenalidomide (REVLIMID) 10 MG capsule Take 1 capsule (10 mg total) by mouth daily. Take for 21 days, then hold for 7 days. Repeat every 28 days. 21 capsule 0   Multiple Vitamin (MULTIVITAMIN ADULT PO) Take by mouth.     sulfamethoxazole-trimethoprim (BACTRIM DS) 800-160 MG tablet Take 1 tablet by mouth 3 (three) times a week. Not refilling after current fill.     tamsulosin (FLOMAX) 0.4 MG CAPS capsule Take 0.4 mg by mouth at bedtime.     No current facility-administered medications for this visit.   Facility-Administered Medications Ordered in Other Visits  Medication Dose Route Frequency Provider Last Rate Last Admin   acetaminophen (TYLENOL) 325 MG tablet            dexamethasone (DECADRON) 4 MG tablet            diphenhydrAMINE (BENADRYL) 25 mg capsule              Review of Systems  Please see the history of present illness.    (+) Chest discomfort (+) Fatigue, trace lower extremity edema  All other systems reviewed and are otherwise negative except as noted above.  Physical Exam    Wt Readings from Last 3 Encounters:  10/10/22 295 lb (133.8 kg)  09/21/22 (!) 301 lb 14.4 oz (136.9 kg)  08/24/22  (!) 301 lb 6.4 oz (136.7 kg)   VS: Vitals:   10/10/22 1025  BP: 118/66  Pulse: 98  SpO2: 99%  ,Body mass index is 44.85 kg/m.  Constitutional:      Appearance: Healthy appearance. Not in distress.  Neck:     Vascular: JVD normal.  Pulmonary:     Effort: Pulmonary effort is normal.     Breath sounds: No wheezing. No rales. Diminished in the bases Cardiovascular:     Normal rate. Regular rhythm. Normal S1. Normal S2.      Murmurs: There is no murmur.  Edema:    -Trace lower extremity edema Abdominal:     Palpations: Abdomen is soft non tender. There is no hepatomegaly.  Skin:    General: Skin is warm and dry.  Neurological:     General: No focal deficit present.     Mental Status: Alert and oriented to person, place and time.     Cranial Nerves: Cranial nerves are intact.  EKG/LABS/ Recent Cardiac Studies    ECG personally reviewed by me today -none completed today   Lab Results  Component Value Date   WBC 5.6 09/21/2022   HGB 12.8 (L) 09/21/2022   HCT 39.1 09/21/2022   MCV 94.0 09/21/2022   PLT 248 09/21/2022   Lab Results  Component Value Date   CREATININE 1.09 09/21/2022   BUN 15 09/21/2022   NA 139 09/21/2022   K 4.1 09/21/2022   CL 106 09/21/2022   CO2 23 09/21/2022   Lab Results  Component Value Date   ALT 9 09/21/2022   AST 18 09/21/2022   ALKPHOS 90 09/21/2022   BILITOT 0.6 09/21/2022   Lab Results  Component Value Date   CHOL 204 (H) 11/14/2012   HDL 56 11/14/2012   LDLCALC 127 (H) 11/14/2012   TRIG 107 11/14/2012   CHOLHDL 3.6 11/14/2012    No results found for: "HGBA1C"   Assessment & Plan    1.  Chronic diastolic CHF: -2D echo was completed 11/2013 with EF of 60 to 65% with no RWMA and grade 2 DD with mild MVR. -Today patient is euvolemic with trace lower extremity edema present. -Continue Lasix 40 mg daily -Patient was advised to also follow a low-sodium heart healthy diet. -Low sodium diet, fluid restriction <2L, and daily  weights encouraged. Educated to contact our office for weight gain of 2 lbs overnight or 5 lbs in one week.   2.  Essential hypertension: -Patient's blood pressure today was controlled at 118/66   3.  Hyperlipidemia: -Patient's last LDL cholesterol was checked however results are not available. -We will obtain latest results from Dr. Nehemiah Settle and if not completed within the last year we will have patient complete LFTs and lipids.  4.  History of multiple myeloma: -Currently treated by Dr. Cathie Hoops his oncologist with STEM cell therapy  Disposition: Follow-up with Verne Carrow, MD or APP in 6 months    Medication Adjustments/Labs and Tests Ordered: Current medicines are reviewed at length with the patient today.  Concerns regarding medicines are outlined above.   Signed, Napoleon Form, Leodis Rains, NP 10/10/2022, 10:57 AM [facility]

## 2022-10-10 ENCOUNTER — Ambulatory Visit: Payer: Medicare Other | Attending: Nurse Practitioner | Admitting: Nurse Practitioner

## 2022-10-10 ENCOUNTER — Encounter: Payer: Self-pay | Admitting: Nurse Practitioner

## 2022-10-10 VITALS — BP 118/66 | HR 98 | Ht 68.0 in | Wt 295.0 lb

## 2022-10-10 DIAGNOSIS — I5032 Chronic diastolic (congestive) heart failure: Secondary | ICD-10-CM

## 2022-10-10 DIAGNOSIS — E785 Hyperlipidemia, unspecified: Secondary | ICD-10-CM | POA: Diagnosis not present

## 2022-10-10 DIAGNOSIS — I10 Essential (primary) hypertension: Secondary | ICD-10-CM

## 2022-10-10 DIAGNOSIS — C9000 Multiple myeloma not having achieved remission: Secondary | ICD-10-CM

## 2022-10-10 NOTE — Patient Instructions (Signed)
Medication Instructions:  Your physician recommends that you continue on your current medications as directed. Please refer to the Current Medication list given to you today. *If you need a refill on your cardiac medications before your next appointment, please call your pharmacy*   Lab Work: LIPIDS AND LFT's if the patient has not had them recently  If you have labs (blood work) drawn today and your tests are completely normal, you will receive your results only by: MyChart Message (if you have MyChart) OR A paper copy in the mail If you have any lab test that is abnormal or we need to change your treatment, we will call you to review the results.   Testing/Procedures: None ordered   Follow-Up: At [facility], you and your health needs are our priority.  As part of our continuing mission to provide you with exceptional heart care, we have created designated Provider Care Teams.  These Care Teams include your primary Cardiologist (physician) and Advanced Practice Providers (APPs -  Physician Assistants and Nurse Practitioners) who all work together to provide you with the care you need, when you need it.  We recommend signing up for the patient portal called "MyChart".  Sign up information is provided on this After Visit Summary.  MyChart is used to connect with patients for Virtual Visits (Telemedicine).  Patients are able to view lab/test results, encounter notes, upcoming appointments, etc.  Non-urgent messages can be sent to your provider as well.   To learn more about what you can do with MyChart, go to [email or web address].    Your next appointment:   6 month(s)  Provider:   Verne Carrow, MD     Other Instructions

## 2022-10-13 ENCOUNTER — Other Ambulatory Visit: Payer: Self-pay | Admitting: *Deleted

## 2022-10-13 DIAGNOSIS — C9000 Multiple myeloma not having achieved remission: Secondary | ICD-10-CM

## 2022-10-16 ENCOUNTER — Encounter: Payer: Self-pay | Admitting: Oncology

## 2022-10-16 MED ORDER — LENALIDOMIDE 10 MG PO CAPS
10.0000 mg | ORAL_CAPSULE | Freq: Every day | ORAL | 0 refills | Status: DC
Start: 2022-10-16 — End: 2022-11-14

## 2022-10-19 ENCOUNTER — Inpatient Hospital Stay: Payer: Medicare Other | Attending: Oncology

## 2022-10-19 ENCOUNTER — Other Ambulatory Visit: Payer: Self-pay

## 2022-10-19 ENCOUNTER — Inpatient Hospital Stay: Payer: Medicare Other | Admitting: Oncology

## 2022-10-19 ENCOUNTER — Encounter: Payer: Self-pay | Admitting: Oncology

## 2022-10-19 VITALS — BP 128/74 | HR 76 | Temp 97.3°F | Resp 18 | Wt 298.8 lb

## 2022-10-19 DIAGNOSIS — M7989 Other specified soft tissue disorders: Secondary | ICD-10-CM | POA: Diagnosis not present

## 2022-10-19 DIAGNOSIS — G629 Polyneuropathy, unspecified: Secondary | ICD-10-CM | POA: Diagnosis not present

## 2022-10-19 DIAGNOSIS — Z7982 Long term (current) use of aspirin: Secondary | ICD-10-CM | POA: Diagnosis not present

## 2022-10-19 DIAGNOSIS — Z9481 Bone marrow transplant status: Secondary | ICD-10-CM | POA: Diagnosis not present

## 2022-10-19 DIAGNOSIS — C9000 Multiple myeloma not having achieved remission: Secondary | ICD-10-CM

## 2022-10-19 DIAGNOSIS — Z79624 Long term (current) use of inhibitors of nucleotide synthesis: Secondary | ICD-10-CM | POA: Diagnosis not present

## 2022-10-19 DIAGNOSIS — Z87891 Personal history of nicotine dependence: Secondary | ICD-10-CM | POA: Diagnosis not present

## 2022-10-19 DIAGNOSIS — M069 Rheumatoid arthritis, unspecified: Secondary | ICD-10-CM | POA: Insufficient documentation

## 2022-10-19 DIAGNOSIS — Z7961 Long term (current) use of immunomodulator: Secondary | ICD-10-CM | POA: Insufficient documentation

## 2022-10-19 DIAGNOSIS — Z79899 Other long term (current) drug therapy: Secondary | ICD-10-CM | POA: Insufficient documentation

## 2022-10-19 DIAGNOSIS — Z9484 Stem cells transplant status: Secondary | ICD-10-CM | POA: Diagnosis not present

## 2022-10-19 DIAGNOSIS — Z5111 Encounter for antineoplastic chemotherapy: Secondary | ICD-10-CM

## 2022-10-19 DIAGNOSIS — T451X5A Adverse effect of antineoplastic and immunosuppressive drugs, initial encounter: Secondary | ICD-10-CM

## 2022-10-19 DIAGNOSIS — K521 Toxic gastroenteritis and colitis: Secondary | ICD-10-CM | POA: Diagnosis not present

## 2022-10-19 LAB — CBC WITH DIFFERENTIAL (CANCER CENTER ONLY)
Abs Immature Granulocytes: 0.02 10*3/uL (ref 0.00–0.07)
Basophils Absolute: 0.1 10*3/uL (ref 0.0–0.1)
Basophils Relative: 1 %
Eosinophils Absolute: 0.2 10*3/uL (ref 0.0–0.5)
Eosinophils Relative: 3 %
HCT: 37.2 % — ABNORMAL LOW (ref 39.0–52.0)
Hemoglobin: 12.3 g/dL — ABNORMAL LOW (ref 13.0–17.0)
Immature Granulocytes: 0 %
Lymphocytes Relative: 22 %
Lymphs Abs: 1.4 10*3/uL (ref 0.7–4.0)
MCH: 30.8 pg (ref 26.0–34.0)
MCHC: 33.1 g/dL (ref 30.0–36.0)
MCV: 93.2 fL (ref 80.0–100.0)
Monocytes Absolute: 0.8 10*3/uL (ref 0.1–1.0)
Monocytes Relative: 13 %
Neutro Abs: 3.8 10*3/uL (ref 1.7–7.7)
Neutrophils Relative %: 61 %
Platelet Count: 241 10*3/uL (ref 150–400)
RBC: 3.99 MIL/uL — ABNORMAL LOW (ref 4.22–5.81)
RDW: 15.7 % — ABNORMAL HIGH (ref 11.5–15.5)
Smear Review: NORMAL
WBC Count: 6.4 10*3/uL (ref 4.0–10.5)
nRBC: 0 % (ref 0.0–0.2)

## 2022-10-19 LAB — CMP (CANCER CENTER ONLY)
ALT: 9 U/L (ref 0–44)
AST: 17 U/L (ref 15–41)
Albumin: 4.1 g/dL (ref 3.5–5.0)
Alkaline Phosphatase: 79 U/L (ref 38–126)
Anion gap: 10 (ref 5–15)
BUN: 20 mg/dL (ref 8–23)
CO2: 22 mmol/L (ref 22–32)
Calcium: 9.1 mg/dL (ref 8.9–10.3)
Chloride: 102 mmol/L (ref 98–111)
Creatinine: 1.18 mg/dL (ref 0.61–1.24)
GFR, Estimated: >60 mL/min (ref >60)
Glucose, Bld: 98 mg/dL (ref 70–99)
Potassium: 3.8 mmol/L (ref 3.5–5.1)
Sodium: 134 mmol/L — ABNORMAL LOW (ref 135–145)
Total Bilirubin: 0.6 mg/dL (ref 0.3–1.2)
Total Protein: 7.1 g/dL (ref 6.5–8.1)

## 2022-10-19 LAB — LIPID PANEL
Cholesterol: 225 mg/dL — ABNORMAL HIGH (ref 0–200)
HDL: 66 mg/dL (ref >40)
LDL Cholesterol: 129 mg/dL — ABNORMAL HIGH (ref 0–99)
Total CHOL/HDL Ratio: 3.4 RATIO
Triglycerides: 150 mg/dL — ABNORMAL HIGH (ref <150)
VLDL: 30 mg/dL (ref 0–40)

## 2022-10-19 NOTE — Assessment & Plan Note (Signed)
follow up with rheumatology.  Currently he is off Humira and methotrexate.  Symptom has been stable. 

## 2022-10-19 NOTE — Assessment & Plan Note (Signed)
secondary to carpal tunnel disease and chemotherapy Patient did not tolerate gabapentin.  Monitor symptoms-stable  

## 2022-10-19 NOTE — Progress Notes (Signed)
Pt here for follow. Pt reports that first week after taking Revlimid he has diarrhea and then it eases off.

## 2022-10-19 NOTE — Progress Notes (Signed)
Hematology/Oncology Progress note [phone] [phone]      CHIEF COMPLAINTS/REASON FOR VISIT:  Follow-up for multiple myeloma   ASSESSMENT & PLAN:   Cancer Staging  Multiple myeloma not having achieved remission (HCC) Staging form: Plasma Cell Myeloma and Plasma Cell Disorders, AJCC 8th Edition - Clinical stage from 04/22/2020: RISS Stage II (Beta-2-microglobulin (mg/L): 2.7, Albumin (g/dL): 3.1, ISS: Stage II, High-risk cytogenetics: Absent, LDH: Normal) - Signed by Rickard Patience, MD on 08/10/2021   Multiple myeloma not having achieved remission (HCC) 0IgA lambda multiple myeloma, M protein 2.3, normal free light chain ratio Status post Dara Rvd, achieved CR, 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 Stable counts. [facility] myeloma/bone marrow transplant notes were reviewed. 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 MRD pending  maintenance with revlimid 10 mg on days 1-21 of 28 day cycle - proceed with this cycle Revlimid  vaccinations per protocol per on day 180 per [facility]  Zometa  - plan Q3 months to continue to 2 years of therapy. -next due August 2024 Recommend calcium 1200mg  daily with vitamin D supplementation.     Orders Placed This Encounter  Procedures   Lipid panel    Standing Status:   Future    Standing Expiration Date:   10/19/2023    Follow up in 4 weeks.  All questions were answered. The patient knows to call the clinic with any problems, questions or concerns.  Rickard Patience, MD, PhD [facility] Hematology Oncology 10/19/2022    HISTORY OF PRESENTING ILLNESS:  Eric Cruz is a 72 y.o. male presents for follow up of  myeloma .  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  27% plasma cell, cytogenetics - normal, and myeloma FISH panel is negative.  Standard risk.  [place]ongo red staining was added and was negative.  - 07/27/2021, bone marrow biopsy showed hypercellular marrow involved by plasma cell myeloma, CD138 immunohistochemistry plasma cells compromise approximately 70% of the cellular elements and are lambda restricted by light chain in situ hybridization.  Cytogenetics showed duplication of 1q.  Cytogenetics is normal.   04/22/2020 Cancer Staging   Staging form: Plasma Cell Myeloma and Plasma Cell Disorders, AJCC 8th Edition - Clinical stage from 04/22/2020: RISS Stage II (Beta-2-microglobulin (mg/L): 2.7, Albumin (g/dL): 3.1, ISS: Stage II, High-risk cytogenetics: Absent, LDH: Normal) - Signed by Rickard Patience, MD on 08/10/2021 Stage prefix: Initial diagnosis Beta 2 microglobulin range (mg/L): Less than 3.5 Albumin range (g/dL): Less than 3.5 Cytogenetics: 1q addition Lactate dehydrogenase (LDH) (U/L): 136 Serum calcium level: Normal Serum creatinine level: Normal   08/12/2021 Imaging   PET scan showed 1. No evidence of active myeloma on skull base to thigh FDG PET scan. 2. No soft tissue plasmacytoma.3. No lytic or suspicious lesion on CT portion exam.     08/22/2021 -  Chemotherapy   Patient is on Treatment Plan : MYELOMA NEWLY DIAGNOSED TRANSPLANT CANDIDATE DaraVRd (Daratumumab SQ) q21d x 6 Cycles (Induction/Consolidation)     08/24/2021 - 11/09/2021 Chemotherapy   Patient is on Treatment Plan : MYELOMA NEWLY  DIAGNOSED TRANSPLANT CANDIDATE DaraVRd (Daratumumab SQ) q21d x 6 Cycles (Induction/Consolidation)     11/02/2021 Imaging   Bilateral lower extremity US negative for DVT   11/15/2021 Echocardiogram   1. Left  ventricular ejection fraction, by estimation, is 60 to 65%. The left ventricle has normal function. The left ventricle has no regional wall motion abnormalities. Left ventricular diastolic parameters are consistent with Grade II diastolic dysfunction (pseudonormalization). The average left ventricular global longitudinal strain is -17.4 %.   2. Right ventricular systolic function is normal. The right ventricular size is normal. There is mildly elevated pulmonary artery systolic pressure. The estimated right ventricular systolic pressure is 37.7 mmHg.   3. The mitral valve is normal in structure. Mild mitral valve regurgitation. No evidence of mitral stenosis.  4. The aortic valve was not well visualized. Aortic valve regurgitation  is not visualized. No aortic stenosis is present.   5. The inferior vena cava is normal in size with greater than 50% respiratory variability, suggesting right atrial pressure of 3 mmHg.    01/23/2022 Bone Marrow Biopsy   Mildly hypercellular bone marrow (40%) with increased erythropoiesis and 2% plasma cells.    02/15/2022 Procedure   Status post autologous stem cell transplant at [facility] Preparative regimen with Melphalan 200 mg/m2 on 02/14/22 Infusion of stem cells - 6.7 x 10(6) on 02/15/22    06/13/2022 Procedure   Bone marrow biopsy at [facility] showed: Mildly hypercellular bone marrow (40%) with trilineage hematopoiesis and no increase in plasma cells. MRD pending    06/30/2022 -  Chemotherapy   Started on maintenance Revlimid 10mg  D1-21 Q28 days.     For rheumatoid arthritis, patient was seen by Dr. Corliss Skains recently and is currently off methotrexate and Humira.  + Bilateral lower extremity swelling.[place] negative for DVT, rade II diastolic dysfunction one Echo  INTERVAL HISTORY Eric Cruz is a 72 y.o. male who has above history reviewed by me today presents for follow up visit for management of multiple myeloma Repeat bone marrow  biopsy showed CR He reports feeling well.  first week after taking Revlimid he has diarrhea and then it eases off     Review of Systems  Constitutional:  Negative for appetite change, chills, fatigue, fever and unexpected weight change.  HENT:   Negative for hearing loss and voice change.   Eyes:  Negative for eye problems and icterus.  Respiratory:  Negative for chest tightness, cough and shortness of breath.   Cardiovascular:  Negative for chest pain and leg swelling.  Gastrointestinal:  Negative for abdominal distention and abdominal pain.  Endocrine: Negative for hot flashes.  Genitourinary:  Negative for difficulty urinating, dysuria and frequency.   Musculoskeletal:  Positive for arthralgias and back pain.  Skin:  Negative for itching and rash.  Neurological:  Negative for light-headedness and numbness.  Hematological:  Negative for adenopathy. Does not bruise/bleed easily.  Psychiatric/Behavioral:  Negative for confusion.      MEDICAL HISTORY:  Past Medical History:  Diagnosis Date   Arthritis    BPH (benign prostatic hyperplasia)    Cancer (HCC)    Prostate: states he had a positive biopsy but had another one a year later and it was gone.    Depression    Dizziness    GERD (gastroesophageal reflux disease)    Gout    High cholesterol    Hypertension    Sleep apnea    has cpap - not currently wearing   Varicose veins  SURGICAL HISTORY: Past Surgical History:  Procedure Laterality Date   ANTERIOR CERVICAL DECOMP/DISCECTOMY FUSION N/A 02/21/2018   Procedure: Cervical Five-Six Cervical Six-Seven Anterior cervical decompression/discectomy/fusion;  Surgeon: Coletta Memos, MD;  Location: [facility] OR;  Service: Neurosurgery;  Laterality: N/A;  Cervical Five-Six Cervical Six-Seven Anterior cervical decompression/discectomy/fusion   BIOPSY  10/09/2019   Procedure: BIOPSY;  Surgeon: Kerin Salen, MD;  Location: [facility];  Service: Gastroenterology;;   BLADDER SURGERY      CARPAL TUNNEL RELEASE Right 02/11/2019   Procedure: Right Carpal Tunnel Wound Irrigation;  Surgeon: Coletta Memos, MD;  Location: [facility] OR;  Service: Neurosurgery;  Laterality: Right;  Right Carpal tunnel wound exploration/wash out   CARPAL TUNNEL RELEASE Bilateral    CHOLECYSTECTOMY  11/22/2012   CHOLECYSTECTOMY  11/22/2012   Procedure: LAPAROSCOPIC CHOLECYSTECTOMY;  Surgeon: Cherylynn Ridges, MD;  Location: [facility] OR;  Service: General;;   COLONOSCOPY     COLONOSCOPY WITH PROPOFOL N/A 10/09/2019   Procedure: COLONOSCOPY WITH PROPOFOL;  Surgeon: Kerin Salen, MD;  Location: [facility];  Service: Gastroenterology;  Laterality: N/A;   ESOPHAGOGASTRODUODENOSCOPY (EGD) WITH PROPOFOL N/A 10/09/2019   Procedure: ESOPHAGOGASTRODUODENOSCOPY (EGD) WITH PROPOFOL;  Surgeon: Kerin Salen, MD;  Location: [facility];  Service: Gastroenterology;  Laterality: N/A;   POLYPECTOMY  10/09/2019   Procedure: POLYPECTOMY;  Surgeon: Kerin Salen, MD;  Location: [facility];  Service: Gastroenterology;;   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    SOCIAL HISTORY: Social History   Socioeconomic History   Marital status: Married    Spouse name: Not on file   Number of children: 2   Years of education: Not on file   Highest education level: Not on file  Occupational History   Occupation: Retired-mechanic  Tobacco Use   Smoking status: Former    Current packs/day: 0.00    Average packs/day: 1 pack/day for 31.0 years (31.0 ttl pk-yrs)    Types: Cigarettes    Start date: 04/13/1967    Quit date: 04/12/1998    Years since quitting: 24.5    Passive exposure: Never   Smokeless tobacco: Never  Vaping Use   Vaping status: Never Used  Substance and Sexual Activity   Alcohol use: No   Drug use: No   Sexual activity: Not on file  Other Topics Concern   Not on file  Social History Narrative   Not on file   Social Determinants of Health   Financial Resource Strain: Not on file  Food Insecurity: Not on file  Transportation  Needs: Not on file  Physical Activity: Not on file  Stress: Not on file  Social Connections: Not on file  Intimate Partner Violence: Not on file    FAMILY HISTORY: Family History  Problem Relation Age of Onset   Hypertension Mother    Diabetes Mother    Multiple myeloma Mother    Hypertension Father    Diabetes Father    Healthy Son    Healthy Daughter     ALLERGIES:  is allergic to atorvastatin, simvastatin, other, and statins.  MEDICATIONS:  Current Outpatient Medications  Medication Sig Dispense Refill   acyclovir (ZOVIRAX) 800 MG tablet Take 800 mg by mouth 2 (two) times daily.     aspirin EC 81 MG tablet Take 81 mg by mouth daily. Swallow whole.     Calcium Carbonate (CALCIUM 600 PO) Take 600 mg by mouth in the morning and at bedtime.     Cholecalciferol (VITAMIN D3) 125 MCG (5000 UT) CAPS Take 1 capsule by mouth daily.  ferrous sulfate 325 (65 FE) MG EC tablet Take 325 mg by mouth 3 (three) times daily with meals.     folic acid (FOLVITE) 1 MG tablet TAKE 2 TABLETS BY MOUTH DAILY 180 tablet 3   furosemide (LASIX) 40 MG tablet Take 1 tablet (40 mg total) by mouth daily. 90 tablet 3   lenalidomide (REVLIMID) 10 MG capsule Take 1 capsule (10 mg total) by mouth daily. Take for 21 days, then hold for 7 days. Repeat every 28 days. 21 capsule 0   Multiple Vitamin (MULTIVITAMIN ADULT PO) Take by mouth.     sulfamethoxazole-trimethoprim (BACTRIM DS) 800-160 MG tablet Take 1 tablet by mouth 3 (three) times a week. Not refilling after current fill.     tamsulosin (FLOMAX) 0.4 MG CAPS capsule Take 0.4 mg by mouth at bedtime.     No current facility-administered medications for this visit.   Facility-Administered Medications Ordered in Other Visits  Medication Dose Route Frequency Provider Last Rate Last Admin   acetaminophen (TYLENOL) 325 MG tablet            dexamethasone (DECADRON) 4 MG tablet            diphenhydrAMINE (BENADRYL) 25 mg capsule              PHYSICAL  EXAMINATION: ECOG PERFORMANCE STATUS: 1 - Symptomatic but completely ambulatory Vitals:   10/19/22 0958  BP: 128/74  Pulse: 76  Resp: 18  Temp: (!) 97.3 F (36.3 C)  SpO2: 98%   Filed Weights   10/19/22 0958  Weight: 298 lb 12.8 oz (135.5 kg)    Physical Exam Constitutional:      General: He is not in acute distress.    Appearance: He is obese.     Comments: Patient walks with a cane.  HENT:     Head: Normocephalic and atraumatic.  Eyes:     General: No scleral icterus. Cardiovascular:     Rate and Rhythm: Normal rate and regular rhythm.     Heart sounds: Normal heart sounds.  Pulmonary:     Effort: Pulmonary effort is normal. No respiratory distress.     Breath sounds: No wheezing.  Abdominal:     General: Bowel sounds are normal. There is no distension.     Palpations: Abdomen is soft.  Musculoskeletal:        General: No deformity. Normal range of motion.     Cervical back: Normal range of motion and neck supple.     Comments: Trace edema, bilateral low extremities.   Skin:    General: Skin is warm and dry.     Findings: No erythema or rash.  Neurological:     Mental Status: He is alert and oriented to person, place, and time. Mental status is at baseline.     Cranial Nerves: No cranial nerve deficit.     Coordination: Coordination normal.  Psychiatric:        Mood and Affect: Mood normal.      LABORATORY DATA:  I have reviewed the data as listed Lab Results  Component Value Date   WBC 5.6 09/21/2022   HGB 12.8 (L) 09/21/2022   HCT 39.1 09/21/2022   MCV 94.0 09/21/2022   PLT 248 09/21/2022   Recent Labs    08/24/22 1322 09/21/22 1027 10/19/22 0926  NA 136 139 134*  K 3.7 4.1 3.8  CL 104 106 102  CO2 26 23 22   GLUCOSE 94 102* 98  BUN 12 15 20  CREATININE 0.88 1.09 1.18  CALCIUM 9.0 9.2 9.1  GFRNONAA >60 >60 >60  PROT 7.5 7.4 7.1  ALBUMIN 4.3 4.3 4.1  AST 19 18 17   ALT 7 9 9   ALKPHOS 96 90 79  BILITOT 0.4 0.6 0.6   Iron/TIBC/Ferritin/  %Sat    Component Value Date/Time   IRON 78 04/05/2021 1008   TIBC 274 04/05/2021 1008   FERRITIN 425 (H) 04/05/2021 1008   IRONPCTSAT 28 04/05/2021 1008      RADIOGRAPHIC STUDIES: I have personally reviewed the radiological images as listed and agreed with the findings in the report.  No results found.

## 2022-10-19 NOTE — Assessment & Plan Note (Signed)
Encourage oral hydration. Recommend Imodium as needed per instruction.

## 2022-10-19 NOTE — Assessment & Plan Note (Signed)
Chemotherapy plan as listed above 

## 2022-10-19 NOTE — Assessment & Plan Note (Addendum)
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 Stable counts. [facility] myeloma/bone marrow transplant notes were reviewed. 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 MRD pending  maintenance with revlimid 10 mg on days 1-21 of 28 day cycle - proceed with this cycle Revlimid  vaccinations per protocol per on day 180 per [facility]  Zometa  - plan Q3 months to continue to 2 years of therapy [Nov 2025]. -next due August 2024 Recommend calcium 1200mg  daily with vitamin D supplementation.

## 2022-10-20 ENCOUNTER — Other Ambulatory Visit: Payer: Self-pay | Admitting: Cardiovascular Disease

## 2022-10-20 LAB — KAPPA/LAMBDA LIGHT CHAINS
Kappa free light chain: 18.8 mg/L (ref 3.3–19.4)
Kappa, lambda light chain ratio: 1.11 (ref 0.26–1.65)
Lambda free light chains: 16.9 mg/L (ref 5.7–26.3)

## 2022-10-23 LAB — MULTIPLE MYELOMA PANEL, SERUM
Albumin SerPl Elph-Mcnc: 3.9 g/dL (ref 2.9–4.4)
Albumin/Glob SerPl: 1.4 (ref 0.7–1.7)
Alpha 1: 0.3 g/dL (ref 0.0–0.4)
Alpha2 Glob SerPl Elph-Mcnc: 0.7 g/dL (ref 0.4–1.0)
B-Globulin SerPl Elph-Mcnc: 1.1 g/dL (ref 0.7–1.3)
Gamma Glob SerPl Elph-Mcnc: 0.7 g/dL (ref 0.4–1.8)
Globulin, Total: 2.8 g/dL (ref 2.2–3.9)
IgA: 90 mg/dL (ref 61–437)
IgG (Immunoglobin G), Serum: 882 mg/dL (ref 603–1613)
IgM (Immunoglobulin M), Srm: 31 mg/dL (ref 20–172)
Total Protein ELP: 6.7 g/dL (ref 6.0–8.5)

## 2022-11-07 ENCOUNTER — Encounter: Payer: Self-pay | Admitting: Oncology

## 2022-11-07 DIAGNOSIS — C9001 Multiple myeloma in remission: Secondary | ICD-10-CM | POA: Diagnosis not present

## 2022-11-07 DIAGNOSIS — Z23 Encounter for immunization: Secondary | ICD-10-CM | POA: Diagnosis not present

## 2022-11-07 DIAGNOSIS — Z9484 Stem cells transplant status: Secondary | ICD-10-CM | POA: Diagnosis not present

## 2022-11-14 ENCOUNTER — Other Ambulatory Visit: Payer: Self-pay | Admitting: *Deleted

## 2022-11-14 DIAGNOSIS — C9000 Multiple myeloma not having achieved remission: Secondary | ICD-10-CM

## 2022-11-15 ENCOUNTER — Encounter: Payer: Self-pay | Admitting: Oncology

## 2022-11-15 MED ORDER — LENALIDOMIDE 10 MG PO CAPS
10.0000 mg | ORAL_CAPSULE | Freq: Every day | ORAL | 0 refills | Status: AC
Start: 2022-11-15 — End: ?

## 2022-11-16 ENCOUNTER — Inpatient Hospital Stay: Payer: Medicare Other | Attending: Oncology

## 2022-11-16 ENCOUNTER — Inpatient Hospital Stay: Payer: Medicare Other

## 2022-11-16 ENCOUNTER — Inpatient Hospital Stay: Payer: Medicare Other | Admitting: Oncology

## 2022-11-16 ENCOUNTER — Encounter: Payer: Self-pay | Admitting: Oncology

## 2022-11-16 VITALS — BP 121/68 | HR 63 | Temp 97.0°F

## 2022-11-16 VITALS — BP 122/91 | HR 69 | Temp 96.0°F | Resp 18 | Wt 299.8 lb

## 2022-11-16 DIAGNOSIS — Z79899 Other long term (current) drug therapy: Secondary | ICD-10-CM | POA: Diagnosis not present

## 2022-11-16 DIAGNOSIS — Z9481 Bone marrow transplant status: Secondary | ICD-10-CM | POA: Diagnosis not present

## 2022-11-16 DIAGNOSIS — C9000 Multiple myeloma not having achieved remission: Secondary | ICD-10-CM

## 2022-11-16 DIAGNOSIS — Z79624 Long term (current) use of inhibitors of nucleotide synthesis: Secondary | ICD-10-CM | POA: Diagnosis not present

## 2022-11-16 DIAGNOSIS — K521 Toxic gastroenteritis and colitis: Secondary | ICD-10-CM | POA: Diagnosis not present

## 2022-11-16 DIAGNOSIS — Z7961 Long term (current) use of immunomodulator: Secondary | ICD-10-CM | POA: Insufficient documentation

## 2022-11-16 DIAGNOSIS — Z7982 Long term (current) use of aspirin: Secondary | ICD-10-CM | POA: Diagnosis not present

## 2022-11-16 DIAGNOSIS — T451X5A Adverse effect of antineoplastic and immunosuppressive drugs, initial encounter: Secondary | ICD-10-CM | POA: Diagnosis not present

## 2022-11-16 DIAGNOSIS — G629 Polyneuropathy, unspecified: Secondary | ICD-10-CM | POA: Insufficient documentation

## 2022-11-16 DIAGNOSIS — M069 Rheumatoid arthritis, unspecified: Secondary | ICD-10-CM | POA: Diagnosis not present

## 2022-11-16 DIAGNOSIS — Z9484 Stem cells transplant status: Secondary | ICD-10-CM | POA: Insufficient documentation

## 2022-11-16 DIAGNOSIS — Z87891 Personal history of nicotine dependence: Secondary | ICD-10-CM | POA: Insufficient documentation

## 2022-11-16 DIAGNOSIS — Z5111 Encounter for antineoplastic chemotherapy: Secondary | ICD-10-CM | POA: Diagnosis not present

## 2022-11-16 LAB — CBC WITH DIFFERENTIAL (CANCER CENTER ONLY)
Abs Immature Granulocytes: 0.01 10*3/uL (ref 0.00–0.07)
Basophils Absolute: 0.1 10*3/uL (ref 0.0–0.1)
Basophils Relative: 1 %
Eosinophils Absolute: 0.2 10*3/uL (ref 0.0–0.5)
Eosinophils Relative: 3 %
HCT: 37.4 % — ABNORMAL LOW (ref 39.0–52.0)
Hemoglobin: 12.4 g/dL — ABNORMAL LOW (ref 13.0–17.0)
Immature Granulocytes: 0 %
Lymphocytes Relative: 20 %
Lymphs Abs: 1.2 10*3/uL (ref 0.7–4.0)
MCH: 31.3 pg (ref 26.0–34.0)
MCHC: 33.2 g/dL (ref 30.0–36.0)
MCV: 94.4 fL (ref 80.0–100.0)
Monocytes Absolute: 0.8 10*3/uL (ref 0.1–1.0)
Monocytes Relative: 14 %
Neutro Abs: 3.8 10*3/uL (ref 1.7–7.7)
Neutrophils Relative %: 62 %
Platelet Count: 235 10*3/uL (ref 150–400)
RBC: 3.96 MIL/uL — ABNORMAL LOW (ref 4.22–5.81)
RDW: 15.7 % — ABNORMAL HIGH (ref 11.5–15.5)
WBC Count: 6.1 10*3/uL (ref 4.0–10.5)
nRBC: 0 % (ref 0.0–0.2)

## 2022-11-16 LAB — CMP (CANCER CENTER ONLY)
ALT: 9 U/L (ref 0–44)
AST: 17 U/L (ref 15–41)
Albumin: 3.9 g/dL (ref 3.5–5.0)
Alkaline Phosphatase: 73 U/L (ref 38–126)
Anion gap: 5 (ref 5–15)
BUN: 16 mg/dL (ref 8–23)
CO2: 23 mmol/L (ref 22–32)
Calcium: 9.1 mg/dL (ref 8.9–10.3)
Chloride: 107 mmol/L (ref 98–111)
Creatinine: 0.91 mg/dL (ref 0.61–1.24)
GFR, Estimated: >60 mL/min (ref >60)
Glucose, Bld: 112 mg/dL — ABNORMAL HIGH (ref 70–99)
Potassium: 3.8 mmol/L (ref 3.5–5.1)
Sodium: 135 mmol/L (ref 135–145)
Total Bilirubin: 0.4 mg/dL (ref 0.3–1.2)
Total Protein: 6.9 g/dL (ref 6.5–8.1)

## 2022-11-16 MED ORDER — LENALIDOMIDE 10 MG PO CAPS: 10.0000 mg | ORAL_CAPSULE | Freq: Every day | ORAL | 6 refills | Status: DC

## 2022-11-16 MED ORDER — ACYCLOVIR 800 MG PO TABS: 800.0000 mg | ORAL_TABLET | Freq: Two times a day (BID) | ORAL | 4 refills | Status: DC

## 2022-11-16 MED ORDER — SODIUM CHLORIDE 0.9 % IV SOLN
Freq: Once | INTRAVENOUS | Status: AC
  Filled 2022-11-16: qty 250

## 2022-11-16 MED ORDER — ZOLEDRONIC ACID 4 MG/100ML IV SOLN
4.0000 mg | Freq: Once | INTRAVENOUS | Status: AC
  Administered 2022-11-16: 4 mg via INTRAVENOUS
  Filled 2022-11-16: qty 100

## 2022-11-16 NOTE — Assessment & Plan Note (Signed)
secondary to carpal tunnel disease and chemotherapy Patient did not tolerate gabapentin.  Monitor symptoms-stable  

## 2022-11-16 NOTE — Progress Notes (Signed)
Hematology/Oncology Progress note [phone] [phone]      CHIEF COMPLAINTS/REASON FOR VISIT:  Follow-up for multiple myeloma   ASSESSMENT & PLAN:   Cancer Staging  Multiple myeloma not having achieved remission (HCC) Staging form: Plasma Cell Myeloma and Plasma Cell Disorders, AJCC 8th Edition - Clinical stage from 04/22/2020: RISS Stage II (Beta-2-microglobulin (mg/L): 2.7, Albumin (g/dL): 3.1, ISS: Stage II, High-risk cytogenetics: Absent, LDH: Normal) - Signed by Rickard Patience, MD on 08/10/2021   Multiple myeloma not having achieved remission (HCC) 0IgA lambda multiple myeloma, M protein 2.3, normal free light chain ratio Status post Dara Rvd, achieved CR, 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 Stable counts. [facility] myeloma/bone marrow transplant notes were reviewed. 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  maintenance with revlimid 10 mg on days 1-21 of 28 day cycle - proceed with this cycle Revlimid  vaccinations per protocol per on day 180 per [facility]  Zometa  - plan Q3 months to continue to 2 years of therapy [Nov 2025]., proceed today, -next due Nov 2024 Recommend calcium 1200mg  daily with vitamin D supplementation.   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  Monitor symptoms-stable   Encounter for antineoplastic chemotherapy Chemotherapy plan as listed above.  Chemotherapy induced diarrhea Encourage oral hydration. Recommend Imodium as needed per instruction.    Orders Placed This Encounter  Procedures   Comprehensive metabolic panel    Standing Status:   Future     Standing Expiration Date:   11/16/2023   CBC with Differential/Platelet    Standing Status:   Future    Standing Expiration Date:   11/16/2023   Multiple Myeloma Panel (SPEP&IFE w/QIG)    Standing Status:   Future    Standing Expiration Date:   11/16/2023   Kappa/lambda light chains    Standing Status:   Future    Standing Expiration Date:   11/16/2023    Follow up in 4 weeks.  All questions were answered. The patient knows to call the clinic with any problems, questions or concerns.  Rickard Patience, MD, PhD [facility] Hematology Oncology 11/16/2022    HISTORY OF PRESENTING ILLNESS:  Eric Cruz is a 72 y.o. male presents for follow up of myeloma .  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  27% plasma cell, cytogenetics - normal, and myeloma FISH panel is negative.  Standard risk.  [place] red staining was added and was negative.  - 07/27/2021, bone marrow biopsy showed hypercellular marrow involved by plasma cell myeloma, CD138 immunohistochemistry plasma cells compromise approximately 70% of the cellular elements and are lambda restricted by light chain in situ hybridization.  Cytogenetics showed duplication of 1q.  Cytogenetics is normal.   04/22/2020 Cancer Staging   Staging form: Plasma Cell Myeloma and Plasma Cell Disorders, AJCC 8th Edition - Clinical stage from 04/22/2020: RISS Stage II (Beta-2-microglobulin (mg/L): 2.7, Albumin (g/dL): 3.1, ISS: Stage II, High-risk cytogenetics: Absent, LDH: Normal) - Signed by Rickard Patience, MD on 08/10/2021 Stage prefix: Initial diagnosis Beta 2  microglobulin range (mg/L): Less than 3.5 Albumin range (g/dL): Less than 3.5 Cytogenetics: 1q addition Lactate dehydrogenase (LDH) (U/L): 136 Serum calcium level: Normal Serum creatinine level: Normal   08/12/2021  Imaging   PET scan showed 1. No evidence of active myeloma on skull base to thigh FDG PET scan. 2. No soft tissue plasmacytoma.3. No lytic or suspicious lesion on CT portion exam.     08/22/2021 - 01/11/2022 Chemotherapy   Patient is on Treatment Plan : MYELOMA NEWLY DIAGNOSED TRANSPLANT CANDIDATE DaraVRd (Daratumumab SQ) q21d x 6 Cycles (Induction/Consolidation)     08/24/2021 - 11/09/2021 Chemotherapy   Patient is on Treatment Plan : MYELOMA NEWLY DIAGNOSED TRANSPLANT CANDIDATE DaraVRd (Daratumumab SQ) q21d x 6 Cycles (Induction/Consolidation)     11/02/2021 Imaging   Bilateral lower extremity US negative for DVT   11/15/2021 Echocardiogram   1. Left ventricular ejection fraction, by estimation, is 60 to 65%. The left ventricle has normal function. The left ventricle has no regional wall motion abnormalities. Left ventricular diastolic parameters are consistent with Grade II diastolic dysfunction (pseudonormalization). The average left ventricular global longitudinal strain is -17.4 %.   2. Right ventricular systolic function is normal. The right ventricular size is normal. There is mildly elevated pulmonary artery systolic pressure. The estimated right ventricular systolic pressure is 37.7 mmHg.   3. The mitral valve is normal in structure. Mild mitral valve regurgitation. No evidence of mitral stenosis.  4. The aortic valve was not well visualized. Aortic valve regurgitation  is not visualized. No aortic stenosis is present.   5. The inferior vena cava is normal in size with greater than 50% respiratory variability, suggesting right atrial pressure of 3 mmHg.    01/23/2022 Bone Marrow Biopsy   Mildly hypercellular bone marrow (40%) with increased erythropoiesis and 2% plasma cells.    02/15/2022 Procedure   Status post autologous stem cell transplant at [facility] Preparative regimen with Melphalan 200 mg/m2 on 02/14/22 Infusion of stem cells - 6.7 x 10(6) on 02/15/22     06/13/2022 Procedure   Bone marrow biopsy at [facility] showed: Mildly hypercellular bone marrow (40%) with trilineage hematopoiesis and no increase in plasma cells. MRD pending    06/30/2022 -  Chemotherapy   Started on maintenance Revlimid 10mg  D1-21 Q28 days.     For rheumatoid arthritis, patient was seen by Dr. Corliss Skains recently and is currently off methotrexate and Humira.  + Bilateral lower extremity swelling.[place] negative for DVT, rade II diastolic dysfunction one Echo  INTERVAL HISTORY Eric Cruz is a 72 y.o. male who has above history reviewed by me today presents for follow up visit for management of multiple myeloma Repeat bone marrow biopsy showed CR He reports feeling well.  first week after taking Revlimid he has diarrhea and then it eases off     Review of Systems  Constitutional:  Negative for appetite change, chills, fatigue, fever and unexpected weight change.  HENT:   Negative for hearing loss and voice change.   Eyes:  Negative for eye problems and icterus.  Respiratory:  Negative for chest tightness, cough and shortness of breath.   Cardiovascular:  Negative for chest pain and leg swelling.  Gastrointestinal:  Negative for abdominal distention and abdominal pain.  Endocrine: Negative for hot flashes.  Genitourinary:  Negative for difficulty urinating, dysuria and frequency.   Musculoskeletal:  Positive for arthralgias and back pain.  Skin:  Negative for itching and rash.  Neurological:  Negative for light-headedness and numbness.  Hematological:  Negative for adenopathy. Does not bruise/bleed easily.  Psychiatric/Behavioral:  Negative for confusion.      MEDICAL HISTORY:  Past Medical History:  Diagnosis Date   Arthritis    BPH (benign prostatic hyperplasia)    Cancer (HCC)    Prostate: states he had a positive biopsy but had another one a year later and it was gone.    Depression    Dizziness    GERD (gastroesophageal reflux disease)    Gout     High cholesterol    Hypertension    Sleep apnea    has cpap - not currently wearing   Varicose veins     SURGICAL HISTORY: Past Surgical History:  Procedure Laterality Date   ANTERIOR CERVICAL DECOMP/DISCECTOMY FUSION N/A 02/21/2018   Procedure: Cervical Five-Six Cervical Six-Seven Anterior cervical decompression/discectomy/fusion;  Surgeon: Coletta Memos, MD;  Location: [facility] OR;  Service: Neurosurgery;  Laterality: N/A;  Cervical Five-Six Cervical Six-Seven Anterior cervical decompression/discectomy/fusion   BIOPSY  10/09/2019   Procedure: BIOPSY;  Surgeon: Kerin Salen, MD;  Location: [facility] ENDOSCOPY;  Service: Gastroenterology;;   BLADDER SURGERY     CARPAL TUNNEL RELEASE Right 02/11/2019   Procedure: Right Carpal Tunnel Wound Irrigation;  Surgeon: Coletta Memos, MD;  Location: [facility] OR;  Service: Neurosurgery;  Laterality: Right;  Right Carpal tunnel wound exploration/wash out   CARPAL TUNNEL RELEASE Bilateral    CHOLECYSTECTOMY  11/22/2012   CHOLECYSTECTOMY  11/22/2012   Procedure: LAPAROSCOPIC CHOLECYSTECTOMY;  Surgeon: Cherylynn Ridges, MD;  Location: [facility] OR;  Service: General;;   COLONOSCOPY     COLONOSCOPY WITH PROPOFOL N/A 10/09/2019   Procedure: COLONOSCOPY WITH PROPOFOL;  Surgeon: Kerin Salen, MD;  Location: [facility];  Service: Gastroenterology;  Laterality: N/A;   ESOPHAGOGASTRODUODENOSCOPY (EGD) WITH PROPOFOL N/A 10/09/2019   Procedure: ESOPHAGOGASTRODUODENOSCOPY (EGD) WITH PROPOFOL;  Surgeon: Kerin Salen, MD;  Location: [facility];  Service: Gastroenterology;  Laterality: N/A;   POLYPECTOMY  10/09/2019   Procedure: POLYPECTOMY;  Surgeon: Kerin Salen, MD;  Location: [facility]COPY;  Service: Gastroenterology;;   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    SOCIAL HISTORY: Social History   Socioeconomic History   Marital status: Married    Spouse name: Not on file   Number of children: 2   Years of education: Not on file   Highest education level: Not on file  Occupational History    Occupation: Retired-mechanic  Tobacco Use   Smoking status: Former    Current packs/day: 0.00    Average packs/day: 1 pack/day for 31.0 years (31.0 ttl pk-yrs)    Types: Cigarettes    Start date: 04/13/1967    Quit date: 04/12/1998    Years since quitting: 24.6    Passive exposure: Never   Smokeless tobacco: Never  Vaping Use   Vaping status: Never Used  Substance and Sexual Activity   Alcohol use: No   Drug use: No   Sexual activity: Not on file  Other Topics Concern   Not on file  Social History Narrative   Not on file   Social Determinants of Health   Financial Resource Strain: Not on file  Food Insecurity: Not on file  Transportation Needs: Not on file  Physical Activity: Not on file  Stress: Not on file  Social Connections: Not on file  Intimate Partner Violence: Not on file    FAMILY HISTORY: Family History  Problem Relation Age of Onset   Hypertension Mother    Diabetes Mother    Multiple myeloma Mother  Hypertension Father    Diabetes Father    Healthy Son    Healthy Daughter     ALLERGIES:  is allergic to atorvastatin, simvastatin, other, and statins.  MEDICATIONS:  Current Outpatient Medications  Medication Sig Dispense Refill   aspirin EC 81 MG tablet Take 81 mg by mouth daily. Swallow whole.     Calcium Carbonate (CALCIUM 600 PO) Take 600 mg by mouth in the morning and at bedtime.     Cholecalciferol (VITAMIN D3) 125 MCG (5000 UT) CAPS Take 1 capsule by mouth daily.     ferrous sulfate 325 (65 FE) MG EC tablet Take 325 mg by mouth 3 (three) times daily with meals.     furosemide (LASIX) 40 MG tablet Take 1 tablet (40 mg total) by mouth daily. 90 tablet 3   Multiple Vitamin (MULTIVITAMIN ADULT PO) Take by mouth.     tamsulosin (FLOMAX) 0.4 MG CAPS capsule Take 0.4 mg by mouth at bedtime.     acyclovir (ZOVIRAX) 800 MG tablet Take 1 tablet (800 mg total) by mouth 2 (two) times daily. 60 tablet 4   lenalidomide (REVLIMID) 10 MG capsule Take 1 capsule  (10 mg total) by mouth daily. Take for 21 days, then hold for 7 days. Repeat every 28 days. 21 capsule 6   No current facility-administered medications for this visit.   Facility-Administered Medications Ordered in Other Visits  Medication Dose Route Frequency Provider Last Rate Last Admin   acetaminophen (TYLENOL) 325 MG tablet            dexamethasone (DECADRON) 4 MG tablet            diphenhydrAMINE (BENADRYL) 25 mg capsule              PHYSICAL EXAMINATION: ECOG PERFORMANCE STATUS: 1 - Symptomatic but completely ambulatory Vitals:   11/16/22 1112  BP: (!) 122/91  Pulse: 69  Resp: 18  Temp: (!) 96 F (35.6 C)   Filed Weights   11/16/22 1112  Weight: 299 lb 12.8 oz (136 kg)    Physical Exam Constitutional:      General: He is not in acute distress.    Appearance: He is obese.     Comments: Patient walks with a cane.  HENT:     Head: Normocephalic and atraumatic.  Eyes:     General: No scleral icterus. Cardiovascular:     Rate and Rhythm: Normal rate and regular rhythm.     Heart sounds: Normal heart sounds.  Pulmonary:     Effort: Pulmonary effort is normal. No respiratory distress.     Breath sounds: No wheezing.  Abdominal:     General: Bowel sounds are normal. There is no distension.     Palpations: Abdomen is soft.  Musculoskeletal:        General: No deformity. Normal range of motion.     Cervical back: Normal range of motion and neck supple.     Comments: Trace edema, bilateral low extremities.   Skin:    General: Skin is warm and dry.     Findings: No erythema or rash.  Neurological:     Mental Status: He is alert and oriented to person, place, and time. Mental status is at baseline.     Cranial Nerves: No cranial nerve deficit.     Coordination: Coordination normal.  Psychiatric:        Mood and Affect: Mood normal.      LABORATORY DATA:  I have reviewed the data  as listed Lab Results  Component Value Date   WBC 6.1 11/16/2022   HGB 12.4  (L) 11/16/2022   HCT 37.4 (L) 11/16/2022   MCV 94.4 11/16/2022   PLT 235 11/16/2022   Recent Labs    09/21/22 1027 10/19/22 0926 11/16/22 1018  NA 139 134* 135  K 4.1 3.8 3.8  CL 106 102 107  CO2 23 22 23   GLUCOSE 102* 98 112*  BUN 15 20 16   CREATININE 1.09 1.18 0.91  CALCIUM 9.2 9.1 9.1  GFRNONAA >60 >60 >60  PROT 7.4 7.1 6.9  ALBUMIN 4.3 4.1 3.9  AST 18 17 17   ALT 9 9 9   ALKPHOS 90 79 73  BILITOT 0.6 0.6 0.4   Iron/TIBC/Ferritin/ %Sat    Component Value Date/Time   IRON 78 04/05/2021 1008   TIBC 274 04/05/2021 1008   FERRITIN 425 (H) 04/05/2021 1008   IRONPCTSAT 28 04/05/2021 1008      RADIOGRAPHIC STUDIES: I have personally reviewed the radiological images as listed and agreed with the findings in the report.  No results found.

## 2022-11-16 NOTE — Progress Notes (Signed)
Patient here for follow up.  Pt reports he continues to have back pain 1/10. He would also like a refill on Acyclovir

## 2022-11-16 NOTE — Assessment & Plan Note (Signed)
Chemotherapy plan as listed above 

## 2022-11-16 NOTE — Assessment & Plan Note (Addendum)
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 Stable counts. [facility] myeloma/bone marrow transplant notes were reviewed. 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  maintenance with revlimid 10 mg on days 1-21 of 28 day cycle - proceed with this cycle Revlimid  vaccinations per protocol per on day 180 per [facility]  Zometa  - plan Q3 months to continue to 2 years of therapy [Nov 2025]., proceed today, -next due Nov 2024 Recommend calcium 1200mg  daily with vitamin D supplementation.

## 2022-11-16 NOTE — Assessment & Plan Note (Signed)
Encourage oral hydration. Recommend Imodium as needed per instruction.

## 2022-11-17 LAB — KAPPA/LAMBDA LIGHT CHAINS
Kappa free light chain: 16.5 mg/L (ref 3.3–19.4)
Kappa, lambda light chain ratio: 1.09 (ref 0.26–1.65)
Lambda free light chains: 15.2 mg/L (ref 5.7–26.3)

## 2022-11-21 ENCOUNTER — Telehealth: Payer: Self-pay | Admitting: *Deleted

## 2022-11-21 LAB — MULTIPLE MYELOMA PANEL, SERUM
Albumin SerPl Elph-Mcnc: 3.8 g/dL (ref 2.9–4.4)
Albumin/Glob SerPl: 1.5 (ref 0.7–1.7)
Alpha 1: 0.2 g/dL (ref 0.0–0.4)
Alpha2 Glob SerPl Elph-Mcnc: 0.7 g/dL (ref 0.4–1.0)
B-Globulin SerPl Elph-Mcnc: 1 g/dL (ref 0.7–1.3)
Gamma Glob SerPl Elph-Mcnc: 0.8 g/dL (ref 0.4–1.8)
Globulin, Total: 2.6 g/dL (ref 2.2–3.9)
IgA: 110 mg/dL (ref 61–437)
IgG (Immunoglobin G), Serum: 882 mg/dL (ref 603–1613)
IgM (Immunoglobulin M), Srm: 27 mg/dL (ref 20–172)
Total Protein ELP: 6.4 g/dL (ref 6.0–8.5)

## 2022-11-21 NOTE — Telephone Encounter (Signed)
Patient call reporting that his podiatrist found that he has an infected great toe yesterday while cutting his toenails and wants to put him on Lamisil. He asked patient to call us to see if that is ok to take with his Revlimid he is on. Please advise

## 2022-11-21 NOTE — Telephone Encounter (Signed)
Call returned to pt and no answer. Detailed message left letting him know that its ok to use Lamisil.

## 2022-12-07 ENCOUNTER — Other Ambulatory Visit: Payer: Self-pay

## 2022-12-07 DIAGNOSIS — C9000 Multiple myeloma not having achieved remission: Secondary | ICD-10-CM

## 2022-12-07 MED ORDER — LENALIDOMIDE 10 MG PO CAPS
10.0000 mg | ORAL_CAPSULE | Freq: Every day | ORAL | 6 refills | Status: DC
Start: 2022-12-07 — End: 2023-01-08

## 2022-12-15 ENCOUNTER — Inpatient Hospital Stay (HOSPITAL_BASED_OUTPATIENT_CLINIC_OR_DEPARTMENT_OTHER): Payer: Medicare Other | Admitting: Oncology

## 2022-12-15 ENCOUNTER — Inpatient Hospital Stay: Payer: Medicare Other | Attending: Oncology

## 2022-12-15 ENCOUNTER — Encounter: Payer: Self-pay | Admitting: Oncology

## 2022-12-15 VITALS — BP 156/85 | HR 73 | Temp 96.7°F | Resp 18 | Wt 298.6 lb

## 2022-12-15 DIAGNOSIS — G629 Polyneuropathy, unspecified: Secondary | ICD-10-CM

## 2022-12-15 DIAGNOSIS — Z87891 Personal history of nicotine dependence: Secondary | ICD-10-CM | POA: Diagnosis not present

## 2022-12-15 DIAGNOSIS — K521 Toxic gastroenteritis and colitis: Secondary | ICD-10-CM

## 2022-12-15 DIAGNOSIS — C9000 Multiple myeloma not having achieved remission: Secondary | ICD-10-CM

## 2022-12-15 DIAGNOSIS — Z5111 Encounter for antineoplastic chemotherapy: Secondary | ICD-10-CM

## 2022-12-15 DIAGNOSIS — T451X5A Adverse effect of antineoplastic and immunosuppressive drugs, initial encounter: Secondary | ICD-10-CM | POA: Diagnosis not present

## 2022-12-15 DIAGNOSIS — Z9481 Bone marrow transplant status: Secondary | ICD-10-CM | POA: Insufficient documentation

## 2022-12-15 DIAGNOSIS — Z9484 Stem cells transplant status: Secondary | ICD-10-CM | POA: Diagnosis not present

## 2022-12-15 LAB — CBC WITH DIFFERENTIAL/PLATELET
Abs Immature Granulocytes: 0 10*3/uL (ref 0.00–0.07)
Basophils Absolute: 0.1 10*3/uL (ref 0.0–0.1)
Basophils Relative: 1 %
Eosinophils Absolute: 0.2 10*3/uL (ref 0.0–0.5)
Eosinophils Relative: 3 %
HCT: 40.8 % (ref 39.0–52.0)
Hemoglobin: 13.1 g/dL (ref 13.0–17.0)
Immature Granulocytes: 0 %
Lymphocytes Relative: 25 %
Lymphs Abs: 1.3 10*3/uL (ref 0.7–4.0)
MCH: 31.3 pg (ref 26.0–34.0)
MCHC: 32.1 g/dL (ref 30.0–36.0)
MCV: 97.6 fL (ref 80.0–100.0)
Monocytes Absolute: 0.6 10*3/uL (ref 0.1–1.0)
Monocytes Relative: 11 %
Neutro Abs: 3.1 10*3/uL (ref 1.7–7.7)
Neutrophils Relative %: 60 %
Platelets: 203 10*3/uL (ref 150–400)
RBC: 4.18 MIL/uL — ABNORMAL LOW (ref 4.22–5.81)
RDW: 15.7 % — ABNORMAL HIGH (ref 11.5–15.5)
WBC: 5.1 10*3/uL (ref 4.0–10.5)
nRBC: 0 % (ref 0.0–0.2)

## 2022-12-15 LAB — COMPREHENSIVE METABOLIC PANEL
ALT: 8 U/L (ref 0–44)
AST: 16 U/L (ref 15–41)
Albumin: 4.1 g/dL (ref 3.5–5.0)
Alkaline Phosphatase: 69 U/L (ref 38–126)
Anion gap: 6 (ref 5–15)
BUN: 11 mg/dL (ref 8–23)
CO2: 24 mmol/L (ref 22–32)
Calcium: 9.1 mg/dL (ref 8.9–10.3)
Chloride: 106 mmol/L (ref 98–111)
Creatinine, Ser: 0.9 mg/dL (ref 0.61–1.24)
GFR, Estimated: >60 mL/min (ref >60)
Glucose, Bld: 95 mg/dL (ref 70–99)
Potassium: 3.6 mmol/L (ref 3.5–5.1)
Sodium: 136 mmol/L (ref 135–145)
Total Bilirubin: 0.6 mg/dL (ref 0.3–1.2)
Total Protein: 7.2 g/dL (ref 6.5–8.1)

## 2022-12-15 NOTE — Assessment & Plan Note (Addendum)
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 Stable counts. [facility] myeloma/bone marrow transplant notes were reviewed. 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  maintenance with revlimid 10 mg on days 1-21 of 28 day cycle - proceed with this cycle Revlimid -start on 12/18/22 vaccinations per protocol per  [facility]  Zometa  - plan Q3 months to continue to 2 years of therapy [Nov 2025]., next due Nov 2024 Recommend calcium 1200mg  daily with vitamin D supplementation.

## 2022-12-15 NOTE — Assessment & Plan Note (Signed)
Chemotherapy plan as listed above 

## 2022-12-15 NOTE — Assessment & Plan Note (Signed)
secondary to carpal tunnel disease and chemotherapy Patient did not tolerate gabapentin.  Monitor symptoms-stable

## 2022-12-15 NOTE — Progress Notes (Signed)
Hematology/Oncology Progress note Telephone:([phone] Fax:([phone]      CHIEF COMPLAINTS/REASON FOR VISIT:  Follow-up for multiple myeloma   ASSESSMENT & PLAN:   Cancer Staging  Multiple myeloma not having achieved remission (HCC) Staging form: Plasma Cell Myeloma and Plasma Cell Disorders, AJCC 8th Edition - Clinical stage from 04/22/2020: RISS Stage II (Beta-2-microglobulin (mg/L): 2.7, Albumin (g/dL): 3.1, ISS: Stage II, High-risk cytogenetics: Absent, LDH: Normal) - Signed by Rickard Patience, MD on 08/10/2021   Multiple myeloma not having achieved remission (HCC) 0IgA lambda multiple myeloma, M protein 2.3, normal free light chain ratio Status post Dara Rvd, achieved CR, 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 Stable counts. [facility] myeloma/bone marrow transplant notes were reviewed. 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  maintenance with revlimid 10 mg on days 1-21 of 28 day cycle - proceed with this cycle Revlimid -start on 12/18/22 vaccinations per protocol per  [facility]  Zometa  - plan Q3 months to continue to 2 years of therapy [Nov 2025]., next due Nov 2024 Recommend calcium 1200mg  daily with vitamin D supplementation.   Encounter for antineoplastic chemotherapy Chemotherapy plan as listed above.  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  Monitor symptoms-stable   Chemotherapy induced diarrhea Encourage oral hydration. Recommend Imodium as needed per instruction.    Orders Placed This Encounter  Procedures   CBC with Differential (Cancer Center Only)    Standing Status:   Future     Standing Expiration Date:   12/15/2023   CMP (Cancer Center only)    Standing Status:   Future    Standing Expiration Date:   12/15/2023   Kappa/lambda light chains    Standing Status:   Future    Standing Expiration Date:   12/15/2023   Multiple Myeloma Panel (SPEP&IFE w/QIG)    Standing Status:   Future    Standing Expiration Date:   12/15/2023    Follow up in 4 weeks.  All questions were answered. The patient knows to call the clinic with any problems, questions or concerns.  Rickard Patience, MD, PhD [facility] Hematology Oncology 12/15/2022    HISTORY OF PRESENTING ILLNESS:  Eric Cruz is a 72 y.o. male presents for follow up of myeloma .  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  27% plasma cell, cytogenetics - normal, and myeloma FISH panel is negative.  Standard risk.  [place]ongo red staining was added and was negative.  - 07/27/2021, bone marrow biopsy showed hypercellular marrow involved by plasma cell myeloma, CD138 immunohistochemistry plasma cells compromise approximately 70% of the cellular elements and are lambda restricted by light chain in situ hybridization.  Cytogenetics showed duplication of 1q.  Cytogenetics is normal.   04/22/2020 Cancer Staging   Staging form: Plasma Cell Myeloma and Plasma Cell Disorders, AJCC 8th Edition - Clinical stage from 04/22/2020: RISS Stage II (Beta-2-microglobulin (mg/L): 2.7, Albumin (g/dL): 3.1, ISS: Stage II, High-risk cytogenetics: Absent, LDH: Normal) - Signed by Rickard Patience, MD on 08/10/2021 Stage prefix: Initial diagnosis Beta  2 microglobulin range (mg/L): Less than 3.5 Albumin range (g/dL): Less than 3.5 Cytogenetics: 1q addition Lactate dehydrogenase (LDH) (U/L): 136 Serum calcium level: Normal Serum creatinine level: Normal   08/12/2021  Imaging   PET scan showed 1. No evidence of active myeloma on skull base to thigh FDG PET scan. 2. No soft tissue plasmacytoma.3. No lytic or suspicious lesion on CT portion exam.     08/22/2021 - 01/11/2022 Chemotherapy   Patient is on Treatment Plan : MYELOMA NEWLY DIAGNOSED TRANSPLANT CANDIDATE DaraVRd (Daratumumab SQ) q21d x 6 Cycles (Induction/Consolidation)     08/24/2021 - 11/09/2021 Chemotherapy   Patient is on Treatment Plan : MYELOMA NEWLY DIAGNOSED TRANSPLANT CANDIDATE DaraVRd (Daratumumab SQ) q21d x 6 Cycles (Induction/Consolidation)     11/02/2021 Imaging   Bilateral lower extremity US negative for DVT   11/15/2021 Echocardiogram   1. Left ventricular ejection fraction, by estimation, is 60 to 65%. The left ventricle has normal function. The left ventricle has no regional wall motion abnormalities. Left ventricular diastolic parameters are consistent with Grade II diastolic dysfunction (pseudonormalization). The average left ventricular global longitudinal strain is -17.4 %.   2. Right ventricular systolic function is normal. The right ventricular size is normal. There is mildly elevated pulmonary artery systolic pressure. The estimated right ventricular systolic pressure is 37.7 mmHg.   3. The mitral valve is normal in structure. Mild mitral valve regurgitation. No evidence of mitral stenosis.  4. The aortic valve was not well visualized. Aortic valve regurgitation  is not visualized. No aortic stenosis is present.   5. The inferior vena cava is normal in size with greater than 50% respiratory variability, suggesting right atrial pressure of 3 mmHg.    01/23/2022 Bone Marrow Biopsy   Mildly hypercellular bone marrow (40%) with increased erythropoiesis and 2% plasma cells.    02/15/2022 Procedure   Status post autologous stem cell transplant at [facility] Preparative regimen with Melphalan 200 mg/m2 on 02/14/22 Infusion of stem cells - 6.7 x 10(6) on 02/15/22     06/13/2022 Procedure   Bone marrow biopsy at [facility] showed: Mildly hypercellular bone marrow (40%) with trilineage hematopoiesis and no increase in plasma cells. MRD pending    06/30/2022 -  Chemotherapy   Started on maintenance Revlimid 10mg  D1-21 Q28 days.     For rheumatoid arthritis, patient was seen by Dr. Corliss Skains recently and is currently off methotrexate and Humira.  + Bilateral lower extremity swelling.[place] negative for DVT, rade II diastolic dysfunction one Echo  INTERVAL HISTORY RAJIV SUPPES is a 72 y.o. male who has above history reviewed by me today presents for follow up visit for management of multiple myeloma He reports feeling well.  first week after taking Revlimid he has diarrhea and then it eases off  No new complaints.     Review of Systems  Constitutional:  Negative for appetite change, chills, fatigue, fever and unexpected weight change.  HENT:   Negative for hearing loss and voice change.   Eyes:  Negative for eye problems and icterus.  Respiratory:  Negative for chest tightness, cough and shortness of breath.   Cardiovascular:  Negative for chest pain and leg swelling.  Gastrointestinal:  Negative for abdominal distention and abdominal pain.  Endocrine: Negative for hot flashes.  Genitourinary:  Negative for difficulty urinating, dysuria and frequency.   Musculoskeletal:  Positive for arthralgias and back pain.  Skin:  Negative for itching and rash.  Neurological:  Negative for light-headedness and numbness.  Hematological:  Negative for adenopathy. Does not bruise/bleed easily.  Psychiatric/Behavioral:  Negative for confusion.      MEDICAL HISTORY:  Past Medical History:  Diagnosis Date   Arthritis    BPH (benign prostatic hyperplasia)    Cancer (HCC)    Prostate: states he had a positive biopsy but had another one a year later and it was gone.    Depression    Dizziness    GERD (gastroesophageal reflux disease)    Gout    High cholesterol     Hypertension    Sleep apnea    has cpap - not currently wearing   Varicose veins     SURGICAL HISTORY: Past Surgical History:  Procedure Laterality Date   ANTERIOR CERVICAL DECOMP/DISCECTOMY FUSION N/A 02/21/2018   Procedure: Cervical Five-Six Cervical Six-Seven Anterior cervical decompression/discectomy/fusion;  Surgeon: Coletta Memos, MD;  Location: [facility] OR;  Service: Neurosurgery;  Laterality: N/A;  Cervical Five-Six Cervical Six-Seven Anterior cervical decompression/discectomy/fusion   BIOPSY  10/09/2019   Procedure: BIOPSY;  Surgeon: Kerin Salen, MD;  Location: [facility];  Service: Gastroenterology;;   BLADDER SURGERY     CARPAL TUNNEL RELEASE Right 02/11/2019   Procedure: Right Carpal Tunnel Wound Irrigation;  Surgeon: Coletta Memos, MD;  Location: [facility] OR;  Service: Neurosurgery;  Laterality: Right;  Right Carpal tunnel wound exploration/wash out   CARPAL TUNNEL RELEASE Bilateral    CHOLECYSTECTOMY  11/22/2012   CHOLECYSTECTOMY  11/22/2012   Procedure: LAPAROSCOPIC CHOLECYSTECTOMY;  Surgeon: Cherylynn Ridges, MD;  Location: [facility] OR;  Service: General;;   COLONOSCOPY     COLONOSCOPY WITH PROPOFOL N/A 10/09/2019   Procedure: COLONOSCOPY WITH PROPOFOL;  Surgeon: Kerin Salen, MD;  Location: [facility];  Service: Gastroenterology;  Laterality: N/A;   ESOPHAGOGASTRODUODENOSCOPY (EGD) WITH PROPOFOL N/A 10/09/2019   Procedure: ESOPHAGOGASTRODUODENOSCOPY (EGD) WITH PROPOFOL;  Surgeon: Kerin Salen, MD;  Location: [facility];  Service: Gastroenterology;  Laterality: N/A;   POLYPECTOMY  10/09/2019   Procedure: POLYPECTOMY;  Surgeon: Kerin Salen, MD;  Location: [facility];  Service: Gastroenterology;;   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    SOCIAL HISTORY: Social History   Socioeconomic History   Marital status: Married    Spouse name: Not on file   Number of children: 2   Years of education: Not on file   Highest education level: Not on file  Occupational History   Occupation:  Retired-mechanic  Tobacco Use   Smoking status: Former    Current packs/day: 0.00    Average packs/day: 1 pack/day for 31.0 years (31.0 ttl pk-yrs)    Types: Cigarettes    Start date: 04/13/1967    Quit date: 04/12/1998    Years since quitting: 24.6    Passive exposure: Never   Smokeless tobacco: Never  Vaping Use   Vaping status: Never Used  Substance and Sexual Activity   Alcohol use: No   Drug use: No   Sexual activity: Not on file  Other Topics Concern   Not on file  Social History Narrative   Not on file   Social Determinants of Health   Financial Resource Strain: Not on file  Food Insecurity: Not on file  Transportation Needs: Not on file  Physical Activity: Not on file  Stress: Not on file  Social Connections: Not on file  Intimate Partner Violence: Not on file    FAMILY HISTORY: Family History  Problem Relation Age of Onset   Hypertension Mother    Diabetes Mother    Multiple myeloma Mother  Hypertension Father    Diabetes Father    Healthy Son    Healthy Daughter     ALLERGIES:  is allergic to atorvastatin, simvastatin, other, and statins.  MEDICATIONS:  Current Outpatient Medications  Medication Sig Dispense Refill   acyclovir (ZOVIRAX) 800 MG tablet Take 1 tablet (800 mg total) by mouth 2 (two) times daily. 60 tablet 4   aspirin EC 81 MG tablet Take 81 mg by mouth daily. Swallow whole.     Calcium Carbonate (CALCIUM 600 PO) Take 600 mg by mouth in the morning and at bedtime.     Cholecalciferol (VITAMIN D3) 125 MCG (5000 UT) CAPS Take 1 capsule by mouth daily.     ferrous sulfate 325 (65 FE) MG EC tablet Take 325 mg by mouth 3 (three) times daily with meals.     furosemide (LASIX) 40 MG tablet Take 1 tablet (40 mg total) by mouth daily. 90 tablet 3   lenalidomide (REVLIMID) 10 MG capsule Take 1 capsule (10 mg total) by mouth daily. Take for 21 days, then hold for 7 days. Repeat every 28 days. 21 capsule 6   Multiple Vitamin (MULTIVITAMIN ADULT PO)  Take by mouth.     tamsulosin (FLOMAX) 0.4 MG CAPS capsule Take 0.4 mg by mouth at bedtime.     No current facility-administered medications for this visit.   Facility-Administered Medications Ordered in Other Visits  Medication Dose Route Frequency Provider Last Rate Last Admin   acetaminophen (TYLENOL) 325 MG tablet            dexamethasone (DECADRON) 4 MG tablet            diphenhydrAMINE (BENADRYL) 25 mg capsule              PHYSICAL EXAMINATION: ECOG PERFORMANCE STATUS: 1 - Symptomatic but completely ambulatory Vitals:   12/15/22 1141  BP: (!) 156/85  Pulse: 73  Resp: 18  Temp: (!) 96.7 F (35.9 C)  SpO2: 100%   Filed Weights   12/15/22 1141  Weight: 298 lb 9.6 oz (135.4 kg)    Physical Exam Constitutional:      General: He is not in acute distress.    Appearance: He is obese.     Comments: Patient walks with a cane.  HENT:     Head: Normocephalic and atraumatic.  Eyes:     General: No scleral icterus. Cardiovascular:     Rate and Rhythm: Normal rate and regular rhythm.     Heart sounds: Normal heart sounds.  Pulmonary:     Effort: Pulmonary effort is normal. No respiratory distress.     Breath sounds: No wheezing.  Abdominal:     General: Bowel sounds are normal. There is no distension.     Palpations: Abdomen is soft.  Musculoskeletal:        General: No deformity. Normal range of motion.     Cervical back: Normal range of motion and neck supple.     Comments: Trace edema, bilateral low extremities.   Skin:    General: Skin is warm and dry.     Findings: No erythema or rash.  Neurological:     Mental Status: He is alert and oriented to person, place, and time. Mental status is at baseline.     Cranial Nerves: No cranial nerve deficit.     Coordination: Coordination normal.  Psychiatric:        Mood and Affect: Mood normal.      LABORATORY DATA:  I have  reviewed the data as listed    Latest Ref Rng & Units 12/15/2022   11:24 AM 11/16/2022    10:18 AM 10/19/2022    9:26 AM  CBC  WBC 4.0 - 10.5 K/uL 5.1  6.1  6.4   Hemoglobin 13.0 - 17.0 g/dL 21.3  08.6  57.8   Hematocrit 39.0 - 52.0 % 40.8  37.4  37.2   Platelets 150 - 400 K/uL 203  235  241       Latest Ref Rng & Units 12/15/2022   11:24 AM 11/16/2022   10:18 AM 10/19/2022    9:26 AM  CMP  Glucose 70 - 99 mg/dL 95  469  98   BUN 8 - 23 mg/dL 11  16  20    Creatinine 0.61 - 1.24 mg/dL 6.29  5.28  4.13   Sodium 135 - 145 mmol/L 136  135  134   Potassium 3.5 - 5.1 mmol/L 3.6  3.8  3.8   Chloride 98 - 111 mmol/L 106  107  102   CO2 22 - 32 mmol/L 24  23  22    Calcium 8.9 - 10.3 mg/dL 9.1  9.1  9.1   Total Protein 6.5 - 8.1 g/dL 7.2  6.9  7.1   Total Bilirubin 0.3 - 1.2 mg/dL 0.6  0.4  0.6   Alkaline Phos 38 - 126 U/L 69  73  79   AST 15 - 41 U/L 16  17  17    ALT 0 - 44 U/L 8  9  9       RADIOGRAPHIC STUDIES: I have personally reviewed the radiological images as listed and agreed with the findings in the report.  No results found.

## 2022-12-15 NOTE — Assessment & Plan Note (Signed)
Encourage oral hydration. Recommend Imodium as needed per instruction.

## 2022-12-18 LAB — KAPPA/LAMBDA LIGHT CHAINS
Kappa free light chain: 23.7 mg/L — ABNORMAL HIGH (ref 3.3–19.4)
Kappa, lambda light chain ratio: 1.45 (ref 0.26–1.65)
Lambda free light chains: 16.4 mg/L (ref 5.7–26.3)

## 2022-12-19 LAB — MULTIPLE MYELOMA PANEL, SERUM
Albumin SerPl Elph-Mcnc: 3.8 g/dL (ref 2.9–4.4)
Albumin/Glob SerPl: 1.2 (ref 0.7–1.7)
Alpha 1: 0.3 g/dL (ref 0.0–0.4)
Alpha2 Glob SerPl Elph-Mcnc: 0.7 g/dL (ref 0.4–1.0)
B-Globulin SerPl Elph-Mcnc: 1.2 g/dL (ref 0.7–1.3)
Gamma Glob SerPl Elph-Mcnc: 1 g/dL (ref 0.4–1.8)
Globulin, Total: 3.2 g/dL (ref 2.2–3.9)
IgA: 133 mg/dL (ref 61–437)
IgG (Immunoglobin G), Serum: 1056 mg/dL (ref 603–1613)
IgM (Immunoglobulin M), Srm: 29 mg/dL (ref 20–172)
Total Protein ELP: 7 g/dL (ref 6.0–8.5)

## 2022-12-22 ENCOUNTER — Other Ambulatory Visit: Payer: Self-pay | Admitting: Cardiovascular Disease

## 2023-01-08 ENCOUNTER — Other Ambulatory Visit: Payer: Self-pay | Admitting: *Deleted

## 2023-01-08 DIAGNOSIS — C9000 Multiple myeloma not having achieved remission: Secondary | ICD-10-CM

## 2023-01-08 MED ORDER — LENALIDOMIDE 10 MG PO CAPS
10.0000 mg | ORAL_CAPSULE | Freq: Every day | ORAL | 6 refills | Status: DC
Start: 2023-01-08 — End: 2023-02-05

## 2023-01-15 ENCOUNTER — Inpatient Hospital Stay: Payer: Medicare Other | Attending: Oncology

## 2023-01-15 ENCOUNTER — Inpatient Hospital Stay: Payer: Medicare Other | Admitting: Oncology

## 2023-01-15 ENCOUNTER — Encounter: Payer: Self-pay | Admitting: Oncology

## 2023-01-15 VITALS — BP 119/72 | HR 66 | Temp 97.4°F | Resp 18 | Wt 289.3 lb

## 2023-01-15 DIAGNOSIS — Z9481 Bone marrow transplant status: Secondary | ICD-10-CM | POA: Insufficient documentation

## 2023-01-15 DIAGNOSIS — G629 Polyneuropathy, unspecified: Secondary | ICD-10-CM | POA: Diagnosis not present

## 2023-01-15 DIAGNOSIS — Z9484 Stem cells transplant status: Secondary | ICD-10-CM | POA: Diagnosis not present

## 2023-01-15 DIAGNOSIS — K521 Toxic gastroenteritis and colitis: Secondary | ICD-10-CM | POA: Diagnosis not present

## 2023-01-15 DIAGNOSIS — Z79899 Other long term (current) drug therapy: Secondary | ICD-10-CM | POA: Diagnosis not present

## 2023-01-15 DIAGNOSIS — C9000 Multiple myeloma not having achieved remission: Secondary | ICD-10-CM

## 2023-01-15 DIAGNOSIS — T451X5A Adverse effect of antineoplastic and immunosuppressive drugs, initial encounter: Secondary | ICD-10-CM | POA: Diagnosis not present

## 2023-01-15 DIAGNOSIS — Z7982 Long term (current) use of aspirin: Secondary | ICD-10-CM | POA: Insufficient documentation

## 2023-01-15 DIAGNOSIS — Z79624 Long term (current) use of inhibitors of nucleotide synthesis: Secondary | ICD-10-CM | POA: Diagnosis not present

## 2023-01-15 DIAGNOSIS — Z7961 Long term (current) use of immunomodulator: Secondary | ICD-10-CM | POA: Diagnosis not present

## 2023-01-15 DIAGNOSIS — Z5111 Encounter for antineoplastic chemotherapy: Secondary | ICD-10-CM

## 2023-01-15 DIAGNOSIS — Z87891 Personal history of nicotine dependence: Secondary | ICD-10-CM | POA: Diagnosis not present

## 2023-01-15 LAB — CMP (CANCER CENTER ONLY)
ALT: 8 U/L (ref 0–44)
AST: 15 U/L (ref 15–41)
Albumin: 3.7 g/dL (ref 3.5–5.0)
Alkaline Phosphatase: 65 U/L (ref 38–126)
Anion gap: 5 (ref 5–15)
BUN: 15 mg/dL (ref 8–23)
CO2: 24 mmol/L (ref 22–32)
Calcium: 8.8 mg/dL — ABNORMAL LOW (ref 8.9–10.3)
Chloride: 106 mmol/L (ref 98–111)
Creatinine: 1 mg/dL (ref 0.61–1.24)
GFR, Estimated: >60 mL/min (ref >60)
Glucose, Bld: 102 mg/dL — ABNORMAL HIGH (ref 70–99)
Potassium: 3.9 mmol/L (ref 3.5–5.1)
Sodium: 135 mmol/L (ref 135–145)
Total Bilirubin: 0.5 mg/dL (ref 0.3–1.2)
Total Protein: 7 g/dL (ref 6.5–8.1)

## 2023-01-15 LAB — CBC WITH DIFFERENTIAL (CANCER CENTER ONLY)
Abs Immature Granulocytes: 0.02 10*3/uL (ref 0.00–0.07)
Basophils Absolute: 0.1 10*3/uL (ref 0.0–0.1)
Basophils Relative: 1 %
Eosinophils Absolute: 0.1 10*3/uL (ref 0.0–0.5)
Eosinophils Relative: 2 %
HCT: 35.4 % — ABNORMAL LOW (ref 39.0–52.0)
Hemoglobin: 11.8 g/dL — ABNORMAL LOW (ref 13.0–17.0)
Immature Granulocytes: 0 %
Lymphocytes Relative: 24 %
Lymphs Abs: 1.4 10*3/uL (ref 0.7–4.0)
MCH: 31.6 pg (ref 26.0–34.0)
MCHC: 33.3 g/dL (ref 30.0–36.0)
MCV: 94.9 fL (ref 80.0–100.0)
Monocytes Absolute: 0.7 10*3/uL (ref 0.1–1.0)
Monocytes Relative: 12 %
Neutro Abs: 3.4 10*3/uL (ref 1.7–7.7)
Neutrophils Relative %: 61 %
Platelet Count: 215 10*3/uL (ref 150–400)
RBC: 3.73 MIL/uL — ABNORMAL LOW (ref 4.22–5.81)
RDW: 14.9 % (ref 11.5–15.5)
WBC Count: 5.6 10*3/uL (ref 4.0–10.5)
nRBC: 0 % (ref 0.0–0.2)

## 2023-01-15 MED ORDER — LOPERAMIDE HCL 2 MG PO CAPS: 2.0000 mg | ORAL_CAPSULE | ORAL | 2 refills | Status: DC

## 2023-01-15 NOTE — Assessment & Plan Note (Addendum)
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 Stable counts. [facility] myeloma/bone marrow transplant notes were reviewed. 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  maintenance with revlimid 10 mg on days 1-21 of 28 day cycle - proceed with this cycle Revlimid -start on 01/15/2023 vaccinations per protocol per  [facility]  Zometa  - plan Q3 months to continue to 2 years of therapy [Nov 2025]., next due Nov 2024 Recommend calcium 1200mg  daily with vitamin D supplementation.

## 2023-01-15 NOTE — Progress Notes (Signed)
Hematology/Oncology Progress note [phone] [phone]      CHIEF COMPLAINTS/REASON FOR VISIT:  Follow-up for multiple myeloma   ASSESSMENT & PLAN:   Cancer Staging  Multiple myeloma not having achieved remission (HCC) Staging form: Plasma Cell Myeloma and Plasma Cell Disorders, AJCC 8th Edition - Clinical stage from 04/22/2020: RISS Stage II (Beta-2-microglobulin (mg/L): 2.7, Albumin (g/dL): 3.1, ISS: Stage II, High-risk cytogenetics: Absent, LDH: Normal) - Signed by Rickard Patience, MD on 08/10/2021   Multiple myeloma not having achieved remission (HCC) 0IgA lambda multiple myeloma, M protein 2.3, normal free light chain ratio Status post Dara Rvd, achieved CR, 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 Stable counts. [facility] myeloma/bone marrow transplant notes were reviewed. 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  maintenance with revlimid 10 mg on days 1-21 of 28 day cycle - proceed with this cycle Revlimid -start on 01/15/2023 vaccinations per protocol per  [facility]  Zometa  - plan Q3 months to continue to 2 years of therapy [Nov 2025]., next due Nov 2024 Recommend calcium 1200mg  daily with vitamin D supplementation.   Chemotherapy induced diarrhea Encourage oral hydration. Recommend Imodium as needed per instruction Patient is reluctant to take.  Prescription was sent to pharmacy.  Encounter for antineoplastic chemotherapy Chemotherapy plan as listed above.  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  Monitor symptoms-stable     Orders Placed This Encounter  Procedures   CBC  with Differential (Cancer Center Only)    Standing Status:   Future    Standing Expiration Date:   01/15/2024   CMP (Cancer Center only)    Standing Status:   Future    Standing Expiration Date:   01/15/2024   Multiple Myeloma Panel (SPEP&IFE w/QIG)    Standing Status:   Future    Standing Expiration Date:   01/15/2024   Kappa/lambda light chains    Standing Status:   Future    Standing Expiration Date:   01/15/2024    Follow up in 4 weeks.  All questions were answered. The patient knows to call the clinic with any problems, questions or concerns.  Rickard Patience, MD, PhD [facility] Hematology Oncology 01/15/2023    HISTORY OF PRESENTING ILLNESS:  Eric Cruz is a 72 y.o. male presents for follow up of myeloma .  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  27% plasma cell, cytogenetics - normal, and myeloma FISH panel is negative.  Standard risk.  [place] red staining was added and was negative.  - 07/27/2021, bone marrow biopsy showed hypercellular marrow involved by plasma cell myeloma, CD138 immunohistochemistry plasma cells compromise approximately 70% of the cellular elements and are lambda restricted by light chain in situ hybridization.  Cytogenetics showed duplication of 1q.  Cytogenetics is normal.   04/22/2020 Cancer Staging   Staging form: Plasma Cell Myeloma and Plasma Cell Disorders, AJCC 8th Edition - Clinical stage from 04/22/2020: RISS Stage II (Beta-2-microglobulin (mg/L): 2.7, Albumin (g/dL): 3.1, ISS: Stage II, High-risk cytogenetics: Absent, LDH: Normal) - Signed  by Rickard Patience, MD on 08/10/2021 Stage prefix: Initial diagnosis Beta 2 microglobulin range (mg/L): Less than 3.5 Albumin range (g/dL): Less than 3.5 Cytogenetics: 1q addition Lactate dehydrogenase (LDH) (U/L):  136 Serum calcium level: Normal Serum creatinine level: Normal   08/12/2021 Imaging   PET scan showed 1. No evidence of active myeloma on skull base to thigh FDG PET scan. 2. No soft tissue plasmacytoma.3. No lytic or suspicious lesion on CT portion exam.     08/22/2021 - 01/11/2022 Chemotherapy   Patient is on Treatment Plan : MYELOMA NEWLY DIAGNOSED TRANSPLANT CANDIDATE DaraVRd (Daratumumab SQ) q21d x 6 Cycles (Induction/Consolidation)     08/24/2021 - 11/09/2021 Chemotherapy   Patient is on Treatment Plan : MYELOMA NEWLY DIAGNOSED TRANSPLANT CANDIDATE DaraVRd (Daratumumab SQ) q21d x 6 Cycles (Induction/Consolidation)     11/02/2021 Imaging   Bilateral lower extremity US negative for DVT   11/15/2021 Echocardiogram   1. Left ventricular ejection fraction, by estimation, is 60 to 65%. The left ventricle has normal function. The left ventricle has no regional wall motion abnormalities. Left ventricular diastolic parameters are consistent with Grade II diastolic dysfunction (pseudonormalization). The average left ventricular global longitudinal strain is -17.4 %.   2. Right ventricular systolic function is normal. The right ventricular size is normal. There is mildly elevated pulmonary artery systolic pressure. The estimated right ventricular systolic pressure is 37.7 mmHg.   3. The mitral valve is normal in structure. Mild mitral valve regurgitation. No evidence of mitral stenosis.  4. The aortic valve was not well visualized. Aortic valve regurgitation  is not visualized. No aortic stenosis is present.   5. The inferior vena cava is normal in size with greater than 50% respiratory variability, suggesting right atrial pressure of 3 mmHg.    01/23/2022 Bone Marrow Biopsy   Mildly hypercellular bone marrow (40%) with increased erythropoiesis and 2% plasma cells.    02/15/2022 Procedure   Status post autologous stem cell transplant at [facility] Preparative regimen with  Melphalan 200 mg/m2 on 02/14/22 Infusion of stem cells - 6.7 x 10(6) on 02/15/22    06/13/2022 Procedure   Bone marrow biopsy at [facility] showed: Mildly hypercellular bone marrow (40%) with trilineage hematopoiesis and no increase in plasma cells. MRD pending    06/30/2022 -  Chemotherapy   Started on maintenance Revlimid 10mg  D1-21 Q28 days.     For rheumatoid arthritis, patient was seen by Dr. Corliss Skains recently and is currently off methotrexate and Humira.  + Bilateral lower extremity swelling.[place] negative for DVT, rade II diastolic dysfunction one Echo  INTERVAL HISTORY Eric Cruz is a 72 y.o. male who has above history reviewed by me today presents for follow up visit for management of multiple myeloma He reports feeling well.  Patient reports diarrhea, on average, 3 loose bowel movement per day.  Previously he was recommended to take Imodium which he is not currently taking. He has been eating healthy and lost weight. No other new complaints.     Review of Systems  Constitutional:  Negative for appetite change, chills, fatigue, fever and unexpected weight change.  HENT:   Negative for hearing loss and voice change.   Eyes:  Negative for eye problems and icterus.  Respiratory:  Negative for chest tightness, cough and shortness of breath.   Cardiovascular:  Negative for chest pain and leg swelling.  Gastrointestinal:  Negative for abdominal distention and abdominal pain.  Endocrine: Negative for hot flashes.  Genitourinary:  Negative for difficulty  urinating, dysuria and frequency.   Musculoskeletal:  Positive for arthralgias and back pain.  Skin:  Negative for itching and rash.  Neurological:  Negative for light-headedness and numbness.  Hematological:  Negative for adenopathy. Does not bruise/bleed easily.  Psychiatric/Behavioral:  Negative for confusion.      MEDICAL HISTORY:  Past Medical History:  Diagnosis Date   Arthritis    BPH (benign prostatic hyperplasia)     Cancer (HCC)    Prostate: states he had a positive biopsy but had another one a year later and it was gone.    Depression    Dizziness    GERD (gastroesophageal reflux disease)    Gout    High cholesterol    Hypertension    Sleep apnea    has cpap - not currently wearing   Varicose veins     SURGICAL HISTORY: Past Surgical History:  Procedure Laterality Date   ANTERIOR CERVICAL DECOMP/DISCECTOMY FUSION N/A 02/21/2018   Procedure: Cervical Five-Six Cervical Six-Seven Anterior cervical decompression/discectomy/fusion;  Surgeon: Coletta Memos, MD;  Location: [facility] OR;  Service: Neurosurgery;  Laterality: N/A;  Cervical Five-Six Cervical Six-Seven Anterior cervical decompression/discectomy/fusion   BIOPSY  10/09/2019   Procedure: BIOPSY;  Surgeon: Kerin Salen, MD;  Location: [facility];  Service: Gastroenterology;;   BLADDER SURGERY     CARPAL TUNNEL RELEASE Right 02/11/2019   Procedure: Right Carpal Tunnel Wound Irrigation;  Surgeon: Coletta Memos, MD;  Location: [facility] OR;  Service: Neurosurgery;  Laterality: Right;  Right Carpal tunnel wound exploration/wash out   CARPAL TUNNEL RELEASE Bilateral    CHOLECYSTECTOMY  11/22/2012   CHOLECYSTECTOMY  11/22/2012   Procedure: LAPAROSCOPIC CHOLECYSTECTOMY;  Surgeon: Cherylynn Ridges, MD;  Location: [facility] OR;  Service: General;;   COLONOSCOPY     COLONOSCOPY WITH PROPOFOL N/A 10/09/2019   Procedure: COLONOSCOPY WITH PROPOFOL;  Surgeon: Kerin Salen, MD;  Location: [facility];  Service: Gastroenterology;  Laterality: N/A;   ESOPHAGOGASTRODUODENOSCOPY (EGD) WITH PROPOFOL N/A 10/09/2019   Procedure: ESOPHAGOGASTRODUODENOSCOPY (EGD) WITH PROPOFOL;  Surgeon: Kerin Salen, MD;  Location: [facility];  Service: Gastroenterology;  Laterality: N/A;   POLYPECTOMY  10/09/2019   Procedure: POLYPECTOMY;  Surgeon: Kerin Salen, MD;  Location: [facility];  Service: Gastroenterology;;   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    SOCIAL HISTORY: Social History    Socioeconomic History   Marital status: Married    Spouse name: Not on file   Number of children: 2   Years of education: Not on file   Highest education level: Not on file  Occupational History   Occupation: Retired-mechanic  Tobacco Use   Smoking status: Former    Current packs/day: 0.00    Average packs/day: 1 pack/day for 31.0 years (31.0 ttl pk-yrs)    Types: Cigarettes    Start date: 04/13/1967    Quit date: 04/12/1998    Years since quitting: 24.7    Passive exposure: Never   Smokeless tobacco: Never  Vaping Use   Vaping status: Never Used  Substance and Sexual Activity   Alcohol use: No   Drug use: No   Sexual activity: Not on file  Other Topics Concern   Not on file  Social History Narrative   Not on file   Social Determinants of Health   Financial Resource Strain: Not on file  Food Insecurity: Not on file  Transportation Needs: Not on file  Physical Activity: Not on file  Stress: Not on file  Social Connections: Not on file  Intimate Partner Violence: Not  on file    FAMILY HISTORY: Family History  Problem Relation Age of Onset   Hypertension Mother    Diabetes Mother    Multiple myeloma Mother    Hypertension Father    Diabetes Father    Healthy Son    Healthy Daughter     ALLERGIES:  is allergic to atorvastatin, simvastatin, other, and statins.  MEDICATIONS:  Current Outpatient Medications  Medication Sig Dispense Refill   acyclovir (ZOVIRAX) 800 MG tablet Take 1 tablet (800 mg total) by mouth 2 (two) times daily. 60 tablet 4   aspirin EC 81 MG tablet Take 81 mg by mouth daily. Swallow whole.     Calcium Carbonate (CALCIUM 600 PO) Take 600 mg by mouth in the morning and at bedtime.     Cholecalciferol (VITAMIN D3) 125 MCG (5000 UT) CAPS Take 1 capsule by mouth daily.     ferrous sulfate 325 (65 FE) MG EC tablet Take 325 mg by mouth 3 (three) times daily with meals.     furosemide (LASIX) 40 MG tablet TAKE 1 TABLET BY MOUTH DAILY 90 tablet 3    lenalidomide (REVLIMID) 10 MG capsule Take 1 capsule (10 mg total) by mouth daily. Take for 21 days, then hold for 7 days. Repeat every 28 days. 21 capsule 6   loperamide (IMODIUM) 2 MG capsule Take 1 capsule (2 mg total) by mouth See admin instructions. Initial: 4 mg,the 2 mg every 2 hours (4 mg every 4 hours at night)  maximum: 16 mg/day 60 capsule 2   Multiple Vitamin (MULTIVITAMIN ADULT PO) Take by mouth.     tamsulosin (FLOMAX) 0.4 MG CAPS capsule Take 0.4 mg by mouth at bedtime.     No current facility-administered medications for this visit.   Facility-Administered Medications Ordered in Other Visits  Medication Dose Route Frequency Provider Last Rate Last Admin   acetaminophen (TYLENOL) 325 MG tablet            dexamethasone (DECADRON) 4 MG tablet            diphenhydrAMINE (BENADRYL) 25 mg capsule              PHYSICAL EXAMINATION: ECOG PERFORMANCE STATUS: 1 - Symptomatic but completely ambulatory Vitals:   01/15/23 1032  BP: 119/72  Pulse: 66  Resp: 18  Temp: (!) 97.4 F (36.3 C)  SpO2: 98%   Filed Weights   01/15/23 1032  Weight: 289 lb 4.8 oz (131.2 kg)    Physical Exam Constitutional:      General: He is not in acute distress.    Appearance: He is obese.     Comments: Patient walks with a cane.  HENT:     Head: Normocephalic and atraumatic.  Eyes:     General: No scleral icterus. Cardiovascular:     Rate and Rhythm: Normal rate and regular rhythm.     Heart sounds: Normal heart sounds.  Pulmonary:     Effort: Pulmonary effort is normal. No respiratory distress.     Breath sounds: No wheezing.  Abdominal:     General: Bowel sounds are normal. There is no distension.     Palpations: Abdomen is soft.  Musculoskeletal:        General: No deformity. Normal range of motion.     Cervical back: Normal range of motion and neck supple.     Comments: Trace edema, bilateral low extremities.   Skin:    General: Skin is warm and dry.  Findings: No erythema  or rash.  Neurological:     Mental Status: He is alert and oriented to person, place, and time. Mental status is at baseline.     Cranial Nerves: No cranial nerve deficit.     Coordination: Coordination normal.  Psychiatric:        Mood and Affect: Mood normal.      LABORATORY DATA:  I have reviewed the data as listed    Latest Ref Rng & Units 01/15/2023   10:18 AM 12/15/2022   11:24 AM 11/16/2022   10:18 AM  CBC  WBC 4.0 - 10.5 K/uL 5.6  5.1  6.1   Hemoglobin 13.0 - 17.0 g/dL 16.1  09.6  04.5   Hematocrit 39.0 - 52.0 % 35.4  40.8  37.4   Platelets 150 - 400 K/uL 215  203  235       Latest Ref Rng & Units 01/15/2023   10:18 AM 12/15/2022   11:24 AM 11/16/2022   10:18 AM  CMP  Glucose 70 - 99 mg/dL 409  95  811   BUN 8 - 23 mg/dL 15  11  16    Creatinine 0.61 - 1.24 mg/dL 9.14  7.82  9.56   Sodium 135 - 145 mmol/L 135  136  135   Potassium 3.5 - 5.1 mmol/L 3.9  3.6  3.8   Chloride 98 - 111 mmol/L 106  106  107   CO2 22 - 32 mmol/L 24  24  23    Calcium 8.9 - 10.3 mg/dL 8.8  9.1  9.1   Total Protein 6.5 - 8.1 g/dL 7.0  7.2  6.9   Total Bilirubin 0.3 - 1.2 mg/dL 0.5  0.6  0.4   Alkaline Phos 38 - 126 U/L 65  69  73   AST 15 - 41 U/L 15  16  17    ALT 0 - 44 U/L 8  8  9       RADIOGRAPHIC STUDIES: I have personally reviewed the radiological images as listed and agreed with the findings in the report.  No results found.

## 2023-01-15 NOTE — Assessment & Plan Note (Addendum)
Encourage oral hydration. Recommend Imodium as needed per instruction Patient is reluctant to take.  Prescription was sent to pharmacy.

## 2023-01-15 NOTE — Assessment & Plan Note (Signed)
Chemotherapy plan as listed above 

## 2023-01-15 NOTE — Assessment & Plan Note (Signed)
secondary to carpal tunnel disease and chemotherapy Patient did not tolerate gabapentin.  Monitor symptoms-stable

## 2023-01-16 LAB — KAPPA/LAMBDA LIGHT CHAINS
Kappa free light chain: 29 mg/L — ABNORMAL HIGH (ref 3.3–19.4)
Kappa, lambda light chain ratio: 1.72 — ABNORMAL HIGH (ref 0.26–1.65)
Lambda free light chains: 16.9 mg/L (ref 5.7–26.3)

## 2023-01-17 NOTE — Progress Notes (Signed)
Office Visit Note  Patient: Eric Cruz             Date of Birth: 1950/12/31           MRN: [id number]             PCP: Renford Dills, MD Referring: Renford Dills, MD Visit Date: 01/30/2023 Occupation: @GUAROCC @  Subjective:  Tingling in both hands and feet  History of Present Illness: Eric Cruz is a 72 y.o. male with seropositive rheumatoid arthritis, osteoarthritis and multiple myeloma.  Patient denies any increased joint pain or joint swelling.  He states none of the joints are painful currently.  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  Patient was started on Revlimid in March 2024.  He is currently on acyclovir and Bactrim DS prophylaxis.    Activities of Daily Living:  Patient reports morning stiffness for 0  none .   Patient Denies nocturnal pain.  Difficulty dressing/grooming: Denies Difficulty climbing stairs: Denies Difficulty getting out of chair: Denies Difficulty using hands for taps, buttons, cutlery, and/or writing: Denies  Review of Systems  Constitutional:  Negative for fatigue.  HENT:  Negative for mouth sores and mouth dryness.   Eyes:  Negative for dryness.  Respiratory:  Negative for shortness of breath.   Cardiovascular:  Positive for palpitations. Negative for chest pain.  Gastrointestinal:  Positive for constipation and diarrhea. Negative for blood in stool.  Endocrine: Positive for increased urination.  Genitourinary:  Negative for difficulty urinating.  Musculoskeletal:  Positive for joint pain and joint pain. Negative for gait problem, joint swelling, myalgias, muscle weakness, morning stiffness, muscle tenderness and myalgias.  Skin:  Negative for color change, rash, hair loss and sensitivity to sunlight.  Allergic/Immunologic: Positive for susceptible to infections.  Neurological:  Negative for dizziness and headaches.  Hematological:  Negative for swollen glands.  Psychiatric/Behavioral:  Negative for  depressed mood and sleep disturbance. The patient is not nervous/anxious.     PMFS History:  Patient Active Problem List   Diagnosis Date Noted   Abnormal weight gain 01/11/2022   Constipation 01/11/2022   Epigastric pain 01/11/2022   Morbid obesity (HCC) 12/20/2021   Morbid obesity with BMI of 45.0-49.9, adult (HCC) 11/30/2021   Leg swelling 11/02/2021   Chemotherapy induced diarrhea 11/02/2021   Neuropathy 09/02/2021   Normocytic anemia 09/02/2021   Encounter for antineoplastic chemotherapy 08/24/2021   Chest pain 08/25/2020   Gastroesophageal reflux disease 08/25/2020   Goals of care, counseling/discussion 04/22/2020   Multiple myeloma not having achieved remission (HCC) 04/22/2020   Rheumatoid arthritis (HCC) 04/07/2020   High risk medication use 04/07/2020   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    Depression    Dizziness    GERD (gastroesophageal reflux disease)    Gout    High cholesterol    Hypertension    Sleep apnea    has cpap - not currently wearing   Varicose veins     Family History  Problem Relation Age of Onset  Hypertension Mother    Diabetes Mother    Multiple myeloma Mother    Hypertension Father    Diabetes Father    Healthy Son    Healthy Daughter    Past Surgical History:  Procedure Laterality Date   ANTERIOR CERVICAL DECOMP/DISCECTOMY FUSION N/A 02/21/2018   Procedure: Cervical Five-Six Cervical Six-Seven Anterior cervical decompression/discectomy/fusion;  Surgeon: Coletta Memos, MD;  Location: [facility] OR;  Service: Neurosurgery;   Laterality: N/A;  Cervical Five-Six Cervical Six-Seven Anterior cervical decompression/discectomy/fusion   BIOPSY  10/09/2019   Procedure: BIOPSY;  Surgeon: Kerin Salen, MD;  Location: [facility];  Service: Gastroenterology;;   BLADDER SURGERY     CARPAL TUNNEL RELEASE Right 02/11/2019   Procedure: Right Carpal Tunnel Wound Irrigation;  Surgeon: Coletta Memos, MD;  Location: [facility] OR;  Service: Neurosurgery;  Laterality: Right;  Right Carpal tunnel wound exploration/wash out   CARPAL TUNNEL RELEASE Bilateral    CHOLECYSTECTOMY  11/22/2012   CHOLECYSTECTOMY  11/22/2012   Procedure: LAPAROSCOPIC CHOLECYSTECTOMY;  Surgeon: Cherylynn Ridges, MD;  Location: [facility] OR;  Service: General;;   COLONOSCOPY     COLONOSCOPY WITH PROPOFOL N/A 10/09/2019   Procedure: COLONOSCOPY WITH PROPOFOL;  Surgeon: Kerin Salen, MD;  Location: [facility];  Service: Gastroenterology;  Laterality: N/A;   ESOPHAGOGASTRODUODENOSCOPY (EGD) WITH PROPOFOL N/A 10/09/2019   Procedure: ESOPHAGOGASTRODUODENOSCOPY (EGD) WITH PROPOFOL;  Surgeon: Kerin Salen, MD;  Location: [facility];  Service: Gastroenterology;  Laterality: N/A;   POLYPECTOMY  10/09/2019   Procedure: POLYPECTOMY;  Surgeon: Kerin Salen, MD;  Location: [facility];  Service: Gastroenterology;;   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   Social History   Social History Narrative   Not on file   Immunization History  Administered Date(s) Administered   Moderna Covid-19 Fall Seasonal Vaccine 84yrs & older 05/30/2022   PFIZER(Purple Top)SARS-COV-2 Vaccination 03/04/2020, 04/05/2020, 09/14/2020     Objective: Vital Signs: BP 115/80 (BP Location: Left Arm, Patient Position: Sitting, Cuff Size: Large)   Pulse 70   Resp 18   Ht 5\' 8"  (1.727 m)   Wt 286 lb (129.7 kg)   BMI 43.49 kg/m    Physical Exam Vitals and nursing note reviewed.  Constitutional:      Appearance: He is well-developed.  HENT:     Head: Normocephalic and atraumatic.  Eyes:     Conjunctiva/sclera:  Conjunctivae normal.     Pupils: Pupils are equal, round, and reactive to light.  Cardiovascular:     Rate and Rhythm: Normal rate and regular rhythm.     Heart sounds: Normal heart sounds.  Pulmonary:     Effort: Pulmonary effort is normal.     Breath sounds: Normal breath sounds.  Abdominal:     General: Bowel sounds are normal.     Palpations: Abdomen is soft.  Musculoskeletal:     Cervical back: Normal range of motion and neck supple.  Skin:    General: Skin is warm and dry.     Capillary Refill: Capillary refill takes less than 2 seconds.  Neurological:     Mental Status: He is alert and oriented to person, place, and time.  Psychiatric:        Behavior: Behavior normal.      Musculoskeletal Exam: Patient was examined in the seated position.  Cervical spine was in good range of motion.  He had no tenderness over thoracic spine.  Shoulder joints with good range of motion.  There was no tenderness over the elbows or wrist joints.  MCPs PIPs  and DIPs were in good range of motion without any synovitis.  Hip joints could not be examined in the seated position.  Knee joints were in good range of motion without any warmth swelling or effusion.  There was no tenderness over ankles or MTPs.  CDAI Exam: CDAI Score: -- Patient Global: --; Provider Global: -- Swollen: --; Tender: -- Joint Exam 01/30/2023   No joint exam has been documented for this visit   There is currently no information documented on the homunculus. Go to the Rheumatology activity and complete the homunculus joint exam.  Investigation: No additional findings.  Imaging: No results found.  Recent Labs: Lab Results  Component Value Date   WBC 5.6 01/15/2023   HGB 11.8 (L) 01/15/2023   PLT 215 01/15/2023   NA 135 01/15/2023   K 3.9 01/15/2023   CL 106 01/15/2023   CO2 24 01/15/2023   GLUCOSE 102 (H) 01/15/2023   BUN 15 01/15/2023   CREATININE 1.00 01/15/2023   BILITOT 0.5 01/15/2023   ALKPHOS 65  01/15/2023   AST 15 01/15/2023   ALT 8 01/15/2023   PROT 7.0 01/15/2023   ALBUMIN 3.7 01/15/2023   CALCIUM 8.8 (L) 01/15/2023   GFRAA 104 06/07/2020   QFTBGOLDPLUS NEGATIVE 03/14/2021    Speciality Comments: Start date :Humira April 2022, methotrexate January 2022  Procedures:  No procedures performed Allergies: Atorvastatin, Simvastatin, Other, and Statins   Assessment / Plan:     Visit Diagnoses: Rheumatoid arthritis with rheumatoid factor of multiple sites without organ or systems involvement (HCC) - Positive RF, positive anti-CCP, elevated sed rate, synovitis involving multiple joints: Patient had no synovitis on the examination today.  He had a stem cell transplant in November 2023 and had chemotherapy since then.  He does not require any further immunosuppression at this point.  High risk medication use - Holding Humira and MTX-since May 2023 while under the care of Dr. Cathie Hoops for management of multiple myeloma.  He had chemotherapy followed by stem cell transplant.  Arthritis of both acromioclavicular joints-he good range of motion without discomfort.  Contracture of right elbow-no synovitis was noted.  HNP (herniated nucleus pulposus) with myelopathy, cervical-good range of motion without discomfort.  History of bilateral carpal tunnel release-he complains of paresthesias in his bilateral hands and bilateral feet most like related to chemotherapy.  Multiple myeloma not having achieved remission [facility]) -he is followed by Dr. Cathie Hoops.  Patient has been holding Humira and methotrexate since May 2023.  He had the stem cell transplant in November 2023 which she tolerated well.  He is on Zovirax and Bactrim prophylaxis.  He is on Revlimid per Dr. Cathie Hoops.  Symptomatic cholelithiasis  Orders: No orders of the defined types were placed in this encounter.  No orders of the defined types were placed in this encounter.    Follow-Up Instructions: Return in about 6 months (around 07/31/2023) for  Rheumatoid arthritis.   Pollyann Savoy, MD  Note - This record has been created using Animal nutritionist.  Chart creation errors have been sought, but may not always  have been located. Such creation errors do not reflect on  the standard of medical care.

## 2023-01-22 LAB — MULTIPLE MYELOMA PANEL, SERUM
Albumin SerPl Elph-Mcnc: 3.6 g/dL (ref 2.9–4.4)
Albumin/Glob SerPl: 1.3 (ref 0.7–1.7)
Alpha 1: 0.3 g/dL (ref 0.0–0.4)
Alpha2 Glob SerPl Elph-Mcnc: 0.7 g/dL (ref 0.4–1.0)
B-Globulin SerPl Elph-Mcnc: 1 g/dL (ref 0.7–1.3)
Gamma Glob SerPl Elph-Mcnc: 1 g/dL (ref 0.4–1.8)
Globulin, Total: 3 g/dL (ref 2.2–3.9)
IgA: 193 mg/dL (ref 61–437)
IgG (Immunoglobin G), Serum: 1056 mg/dL (ref 603–1613)
IgM (Immunoglobulin M), Srm: 54 mg/dL (ref 20–172)
Total Protein ELP: 6.6 g/dL (ref 6.0–8.5)

## 2023-01-30 ENCOUNTER — Ambulatory Visit: Payer: Medicare Other | Attending: Rheumatology | Admitting: Rheumatology

## 2023-01-30 ENCOUNTER — Encounter: Payer: Self-pay | Admitting: Rheumatology

## 2023-01-30 VITALS — BP 115/80 | HR 70 | Resp 18 | Ht 68.0 in | Wt 286.0 lb

## 2023-01-30 DIAGNOSIS — Z79899 Other long term (current) drug therapy: Secondary | ICD-10-CM

## 2023-01-30 DIAGNOSIS — M19011 Primary osteoarthritis, right shoulder: Secondary | ICD-10-CM | POA: Diagnosis not present

## 2023-01-30 DIAGNOSIS — C9000 Multiple myeloma not having achieved remission: Secondary | ICD-10-CM

## 2023-01-30 DIAGNOSIS — M24521 Contracture, right elbow: Secondary | ICD-10-CM | POA: Diagnosis not present

## 2023-01-30 DIAGNOSIS — M5000 Cervical disc disorder with myelopathy, unspecified cervical region: Secondary | ICD-10-CM | POA: Diagnosis not present

## 2023-01-30 DIAGNOSIS — K8020 Calculus of gallbladder without cholecystitis without obstruction: Secondary | ICD-10-CM | POA: Diagnosis not present

## 2023-01-30 DIAGNOSIS — M19012 Primary osteoarthritis, left shoulder: Secondary | ICD-10-CM

## 2023-01-30 DIAGNOSIS — M0579 Rheumatoid arthritis with rheumatoid factor of multiple sites without organ or systems involvement: Secondary | ICD-10-CM

## 2023-01-30 DIAGNOSIS — Z9889 Other specified postprocedural states: Secondary | ICD-10-CM | POA: Diagnosis not present

## 2023-01-30 NOTE — Patient Instructions (Signed)

## 2023-02-05 ENCOUNTER — Other Ambulatory Visit: Payer: Self-pay

## 2023-02-05 DIAGNOSIS — C9000 Multiple myeloma not having achieved remission: Secondary | ICD-10-CM

## 2023-02-05 MED ORDER — LENALIDOMIDE 10 MG PO CAPS: 10.0000 mg | ORAL_CAPSULE | Freq: Every day | ORAL | 6 refills | Status: DC

## 2023-02-13 ENCOUNTER — Inpatient Hospital Stay: Payer: Medicare Other | Admitting: Oncology

## 2023-02-13 ENCOUNTER — Inpatient Hospital Stay: Payer: Medicare Other | Attending: Oncology

## 2023-02-13 ENCOUNTER — Encounter: Payer: Self-pay | Admitting: Oncology

## 2023-02-13 ENCOUNTER — Inpatient Hospital Stay: Payer: Medicare Other

## 2023-02-13 VITALS — BP 122/78 | HR 85 | Temp 96.2°F | Resp 18 | Wt 289.3 lb

## 2023-02-13 DIAGNOSIS — Z7982 Long term (current) use of aspirin: Secondary | ICD-10-CM | POA: Insufficient documentation

## 2023-02-13 DIAGNOSIS — T451X5A Adverse effect of antineoplastic and immunosuppressive drugs, initial encounter: Secondary | ICD-10-CM

## 2023-02-13 DIAGNOSIS — Z9481 Bone marrow transplant status: Secondary | ICD-10-CM | POA: Diagnosis not present

## 2023-02-13 DIAGNOSIS — Z87891 Personal history of nicotine dependence: Secondary | ICD-10-CM | POA: Insufficient documentation

## 2023-02-13 DIAGNOSIS — K521 Toxic gastroenteritis and colitis: Secondary | ICD-10-CM

## 2023-02-13 DIAGNOSIS — Z79899 Other long term (current) drug therapy: Secondary | ICD-10-CM | POA: Diagnosis not present

## 2023-02-13 DIAGNOSIS — C9000 Multiple myeloma not having achieved remission: Secondary | ICD-10-CM

## 2023-02-13 DIAGNOSIS — Z79624 Long term (current) use of inhibitors of nucleotide synthesis: Secondary | ICD-10-CM | POA: Insufficient documentation

## 2023-02-13 DIAGNOSIS — Z9484 Stem cells transplant status: Secondary | ICD-10-CM | POA: Diagnosis not present

## 2023-02-13 DIAGNOSIS — G629 Polyneuropathy, unspecified: Secondary | ICD-10-CM | POA: Insufficient documentation

## 2023-02-13 DIAGNOSIS — Z5111 Encounter for antineoplastic chemotherapy: Secondary | ICD-10-CM | POA: Diagnosis not present

## 2023-02-13 DIAGNOSIS — Z7961 Long term (current) use of immunomodulator: Secondary | ICD-10-CM | POA: Diagnosis not present

## 2023-02-13 LAB — CBC WITH DIFFERENTIAL (CANCER CENTER ONLY)
Abs Immature Granulocytes: 0.01 10*3/uL (ref 0.00–0.07)
Basophils Absolute: 0.1 10*3/uL (ref 0.0–0.1)
Basophils Relative: 2 %
Eosinophils Absolute: 0.2 10*3/uL (ref 0.0–0.5)
Eosinophils Relative: 3 %
HCT: 38 % — ABNORMAL LOW (ref 39.0–52.0)
Hemoglobin: 12.5 g/dL — ABNORMAL LOW (ref 13.0–17.0)
Immature Granulocytes: 0 %
Lymphocytes Relative: 24 %
Lymphs Abs: 1.3 10*3/uL (ref 0.7–4.0)
MCH: 31.6 pg (ref 26.0–34.0)
MCHC: 32.9 g/dL (ref 30.0–36.0)
MCV: 96 fL (ref 80.0–100.0)
Monocytes Absolute: 0.6 10*3/uL (ref 0.1–1.0)
Monocytes Relative: 12 %
Neutro Abs: 3.2 10*3/uL (ref 1.7–7.7)
Neutrophils Relative %: 59 %
Platelet Count: 239 10*3/uL (ref 150–400)
RBC: 3.96 MIL/uL — ABNORMAL LOW (ref 4.22–5.81)
RDW: 15.3 % (ref 11.5–15.5)
WBC Count: 5.4 10*3/uL (ref 4.0–10.5)
nRBC: 0 % (ref 0.0–0.2)

## 2023-02-13 LAB — CMP (CANCER CENTER ONLY)
ALT: 8 U/L (ref 0–44)
AST: 17 U/L (ref 15–41)
Albumin: 4.2 g/dL (ref 3.5–5.0)
Alkaline Phosphatase: 70 U/L (ref 38–126)
Anion gap: 9 (ref 5–15)
BUN: 14 mg/dL (ref 8–23)
CO2: 25 mmol/L (ref 22–32)
Calcium: 9.4 mg/dL (ref 8.9–10.3)
Chloride: 104 mmol/L (ref 98–111)
Creatinine: 0.8 mg/dL (ref 0.61–1.24)
GFR, Estimated: >60 mL/min (ref >60)
Glucose, Bld: 100 mg/dL — ABNORMAL HIGH (ref 70–99)
Potassium: 3.8 mmol/L (ref 3.5–5.1)
Sodium: 138 mmol/L (ref 135–145)
Total Bilirubin: 0.4 mg/dL (ref <1.2)
Total Protein: 7.5 g/dL (ref 6.5–8.1)

## 2023-02-13 MED ORDER — ZOLEDRONIC ACID 4 MG/100ML IV SOLN
4.0000 mg | Freq: Once | INTRAVENOUS | Status: AC
  Administered 2023-02-13: 4 mg via INTRAVENOUS
  Filled 2023-02-13: qty 100

## 2023-02-13 MED ORDER — SODIUM CHLORIDE 0.9 % IV SOLN
Freq: Once | INTRAVENOUS | Status: AC
  Filled 2023-02-13: qty 250

## 2023-02-13 NOTE — Assessment & Plan Note (Addendum)
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 reviewed and discussed with patient. Stable counts. [facility] myeloma/bone marrow transplant notes were reviewed. 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  maintenance with revlimid 10 mg on days 1-21 of 28 day cycle - proceed with this cycle Revlimid -start on 02/13/2023 vaccinations per protocol per  [facility]  Zometa  - plan Q3 months to continue to 2 years of therapy [Nov 2025]. Zometa today, next due Feb 2025 Recommend calcium 1200mg  daily with vitamin D supplementation.

## 2023-02-13 NOTE — Assessment & Plan Note (Signed)
Chemotherapy plan as listed above 

## 2023-02-13 NOTE — Assessment & Plan Note (Signed)
secondary to carpal tunnel disease and chemotherapy Patient did not tolerate gabapentin.  Monitor symptoms-stable

## 2023-02-13 NOTE — Assessment & Plan Note (Signed)
Encourage oral hydration. Recommend Imodium as needed per instruction Patient is reluctant to take.

## 2023-02-13 NOTE — Progress Notes (Signed)
Hematology/Oncology Progress note [phone] [phone]      CHIEF COMPLAINTS/REASON FOR VISIT:  Follow-up for multiple myeloma   ASSESSMENT & PLAN:   Cancer Staging  Multiple myeloma not having achieved remission (HCC) Staging form: Plasma Cell Myeloma and Plasma Cell Disorders, AJCC 8th Edition - Clinical stage from 04/22/2020: RISS Stage II (Beta-2-microglobulin (mg/L): 2.7, Albumin (g/dL): 3.1, ISS: Stage II, High-risk cytogenetics: Absent, LDH: Normal) - Signed by Rickard Patience, MD on 08/10/2021   Multiple myeloma not having achieved remission (HCC) 0IgA lambda multiple myeloma, M protein 2.3, normal free light chain ratio Status post Dara Rvd, achieved CR, 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 Stable counts. [facility] myeloma/bone marrow transplant notes were reviewed. 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  maintenance with revlimid 10 mg on days 1-21 of 28 day cycle - proceed with this cycle Revlimid -start on 02/13/2023 vaccinations per protocol per  [facility]  Zometa  - plan Q3 months to continue to 2 years of therapy [Nov 2025]. Zometa today, next due Feb 2025 Recommend calcium 1200mg  daily with vitamin D supplementation.   Encounter for antineoplastic chemotherapy Chemotherapy plan as listed above.  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  Monitor symptoms-stable   Chemotherapy induced diarrhea Encourage oral hydration. Recommend Imodium as needed per instruction Patient is reluctant to take.      Orders Placed This Encounter  Procedures   CMP (Cancer Center only)     Standing Status:   Future    Standing Expiration Date:   02/13/2024   CBC with Differential (Cancer Center Only)    Standing Status:   Future    Standing Expiration Date:   02/13/2024   Kappa/lambda light chains    Standing Status:   Future    Standing Expiration Date:   02/13/2024   Multiple Myeloma Panel (SPEP&IFE w/QIG)    Standing Status:   Future    Standing Expiration Date:   02/13/2024    Follow up in 4 weeks.  All questions were answered. The patient knows to call the clinic with any problems, questions or concerns.  Rickard Patience, MD, PhD [facility] Hematology Oncology 02/13/2023    HISTORY OF PRESENTING ILLNESS:  BASIM STEMM is a 72 y.o. male presents for follow up of myeloma .  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  27% plasma cell, cytogenetics - normal, and myeloma FISH panel is negative.  Standard risk.  [place] red staining was added and was negative.  - 07/27/2021, bone marrow biopsy showed hypercellular marrow involved by plasma cell myeloma, CD138 immunohistochemistry plasma cells compromise approximately 70% of the cellular elements and are lambda restricted by light chain in situ hybridization.  Cytogenetics showed duplication of 1q.  Cytogenetics is normal.   04/22/2020 Cancer Staging   Staging form: Plasma Cell Myeloma and Plasma Cell Disorders, AJCC 8th Edition - Clinical stage from 04/22/2020: RISS Stage II (Beta-2-microglobulin (mg/L): 2.7, Albumin (g/dL): 3.1, ISS: Stage II, High-risk cytogenetics: Absent, LDH: Normal) - Signed by Cathie Hoops,  Janyth Contes, MD on 08/10/2021 Stage prefix: Initial diagnosis Beta 2 microglobulin range (mg/L): Less than 3.5 Albumin range (g/dL): Less than 3.5 Cytogenetics: 1q addition Lactate dehydrogenase (LDH) (U/L): 136 Serum calcium level:  Normal Serum creatinine level: Normal   08/12/2021 Imaging   PET scan showed 1. No evidence of active myeloma on skull base to thigh FDG PET scan. 2. No soft tissue plasmacytoma.3. No lytic or suspicious lesion on CT portion exam.     08/22/2021 - 01/11/2022 Chemotherapy   Patient is on Treatment Plan : MYELOMA NEWLY DIAGNOSED TRANSPLANT CANDIDATE DaraVRd (Daratumumab SQ) q21d x 6 Cycles (Induction/Consolidation)     08/24/2021 - 11/09/2021 Chemotherapy   Patient is on Treatment Plan : MYELOMA NEWLY DIAGNOSED TRANSPLANT CANDIDATE DaraVRd (Daratumumab SQ) q21d x 6 Cycles (Induction/Consolidation)     11/02/2021 Imaging   Bilateral lower extremity US negative for DVT   11/15/2021 Echocardiogram   1. Left ventricular ejection fraction, by estimation, is 60 to 65%. The left ventricle has normal function. The left ventricle has no regional wall motion abnormalities. Left ventricular diastolic parameters are consistent with Grade II diastolic dysfunction (pseudonormalization). The average left ventricular global longitudinal strain is -17.4 %.   2. Right ventricular systolic function is normal. The right ventricular size is normal. There is mildly elevated pulmonary artery systolic pressure. The estimated right ventricular systolic pressure is 37.7 mmHg.   3. The mitral valve is normal in structure. Mild mitral valve regurgitation. No evidence of mitral stenosis.  4. The aortic valve was not well visualized. Aortic valve regurgitation  is not visualized. No aortic stenosis is present.   5. The inferior vena cava is normal in size with greater than 50% respiratory variability, suggesting right atrial pressure of 3 mmHg.    01/23/2022 Bone Marrow Biopsy   Mildly hypercellular bone marrow (40%) with increased erythropoiesis and 2% plasma cells.    02/15/2022 Procedure   Status post autologous stem cell transplant at [facility] Preparative regimen with Melphalan 200 mg/m2 on  02/14/22 Infusion of stem cells - 6.7 x 10(6) on 02/15/22    06/13/2022 Procedure   Bone marrow biopsy at [facility] showed: Mildly hypercellular bone marrow (40%) with trilineage hematopoiesis and no increase in plasma cells. MRD pending    06/30/2022 -  Chemotherapy   Started on maintenance Revlimid 10mg  D1-21 Q28 days.     For rheumatoid arthritis, patient was seen by Dr. Corliss Skains recently and is currently off methotrexate and Humira.  + Bilateral lower extremity swelling.[place] negative for DVT, rade II diastolic dysfunction one Echo  INTERVAL HISTORY KREGG FALLERT is a 72 y.o. male who has above history reviewed by me today presents for follow up visit for management of multiple myeloma He reports feeling well.  Patient reports diarrhea, on average, 2-3 loose bowel movement per day.  Previously he was recommended to take Imodium which he is not currently taking. He has been eating healthy, stable weight.  No other new complaints.     Review of Systems  Constitutional:  Negative for appetite change, chills, fatigue, fever and unexpected weight change.  HENT:   Negative for hearing loss and voice change.   Eyes:  Negative for eye problems and icterus.  Respiratory:  Negative for chest tightness, cough and shortness of breath.   Cardiovascular:  Negative for chest pain and leg swelling.  Gastrointestinal:  Negative for abdominal distention and abdominal pain.  Endocrine: Negative for hot flashes.  Genitourinary:  Negative for difficulty urinating, dysuria  and frequency.   Musculoskeletal:  Positive for arthralgias and back pain.  Skin:  Negative for itching and rash.  Neurological:  Negative for light-headedness and numbness.  Hematological:  Negative for adenopathy. Does not bruise/bleed easily.  Psychiatric/Behavioral:  Negative for confusion.      MEDICAL HISTORY:  Past Medical History:  Diagnosis Date   Arthritis    BPH (benign prostatic hyperplasia)    Cancer (HCC)     Prostate: states he had a positive biopsy but had another one a year later and it was gone.    Depression    Dizziness    GERD (gastroesophageal reflux disease)    Gout    High cholesterol    Hypertension    Sleep apnea    has cpap - not currently wearing   Varicose veins     SURGICAL HISTORY: Past Surgical History:  Procedure Laterality Date   ANTERIOR CERVICAL DECOMP/DISCECTOMY FUSION N/A 02/21/2018   Procedure: Cervical Five-Six Cervical Six-Seven Anterior cervical decompression/discectomy/fusion;  Surgeon: Coletta Memos, MD;  Location: [facility] OR;  Service: Neurosurgery;  Laterality: N/A;  Cervical Five-Six Cervical Six-Seven Anterior cervical decompression/discectomy/fusion   BIOPSY  10/09/2019   Procedure: BIOPSY;  Surgeon: Kerin Salen, MD;  Location: [facility] END[facility]COPY;  Service: Gastroenterology;;   BLADDER SURGERY     CARPAL TUNNEL RELEASE Right 02/11/2019   Procedure: Right Carpal Tunnel Wound Irrigation;  Surgeon: Coletta Memos, MD;  Location: [facility] OR;  Service: Neurosurgery;  Laterality: Right;  Right Carpal tunnel wound exploration/wash out   CARPAL TUNNEL RELEASE Bilateral    CHOLECYSTECTOMY  11/22/2012   CHOLECYSTECTOMY  11/22/2012   Procedure: LAPAROSCOPIC CHOLECYSTECTOMY;  Surgeon: Cherylynn Ridges, MD;  Location: [facility] OR;  Service: General;;   COLONOSCOPY     COLONOSCOPY WITH PROPOFOL N/A 10/09/2019   Procedure: COLONOSCOPY WITH PROPOFOL;  Surgeon: Kerin Salen, MD;  Location: [facility];  Service: Gastroenterology;  Laterality: N/A;   ESOPHAGOGASTRODUODENOSCOPY (EGD) WITH PROPOFOL N/A 10/09/2019   Procedure: ESOPHAGOGASTRODUODENOSCOPY (EGD) WITH PROPOFOL;  Surgeon: Kerin Salen, MD;  Location: [facility];  Service: Gastroenterology;  Laterality: N/A;   POLYPECTOMY  10/09/2019   Procedure: POLYPECTOMY;  Surgeon: Kerin Salen, MD;  Location: [facility];  Service: Gastroenterology;;   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    SOCIAL HISTORY: Social History   Socioeconomic History    Marital status: Married    Spouse name: Not on file   Number of children: 2   Years of education: Not on file   Highest education level: Not on file  Occupational History   Occupation: Retired-mechanic  Tobacco Use   Smoking status: Former    Current packs/day: 0.00    Average packs/day: 1 pack/day for 31.0 years (31.0 ttl pk-yrs)    Types: Cigarettes    Start date: 04/13/1967    Quit date: 04/12/1998    Years since quitting: 24.8    Passive exposure: Never   Smokeless tobacco: Never  Vaping Use   Vaping status: Never Used  Substance and Sexual Activity   Alcohol use: No   Drug use: No   Sexual activity: Not on file  Other Topics Concern   Not on file  Social History Narrative   Not on file   Social Determinants of Health   Financial Resource Strain: Not on file  Food Insecurity: Not on file  Transportation Needs: Not on file  Physical Activity: Not on file  Stress: Not on file  Social Connections: Not on file  Intimate Partner Violence: Not on file  FAMILY HISTORY: Family History  Problem Relation Age of Onset   Hypertension Mother    Diabetes Mother    Multiple myeloma Mother    Hypertension Father    Diabetes Father    Healthy Son    Healthy Daughter     ALLERGIES:  is allergic to atorvastatin, simvastatin, other, and statins.  MEDICATIONS:  Current Outpatient Medications  Medication Sig Dispense Refill   acyclovir (ZOVIRAX) 800 MG tablet Take 1 tablet (800 mg total) by mouth 2 (two) times daily. 60 tablet 4   aspirin EC 81 MG tablet Take 81 mg by mouth daily. Swallow whole.     Calcium Carbonate (CALCIUM 600 PO) Take 600 mg by mouth in the morning and at bedtime.     Cholecalciferol (VITAMIN D3) 125 MCG (5000 UT) CAPS Take 1 capsule by mouth daily.     ferrous sulfate 325 (65 FE) MG EC tablet Take 325 mg by mouth 3 (three) times daily with meals.     furosemide (LASIX) 40 MG tablet TAKE 1 TABLET BY MOUTH DAILY 90 tablet 3   lenalidomide (REVLIMID) 10  MG capsule Take 1 capsule (10 mg total) by mouth daily. Take for 21 days, then hold for 7 days. Repeat every 28 days. 21 capsule 6   loperamide (IMODIUM) 2 MG capsule Take 1 capsule (2 mg total) by mouth See admin instructions. Initial: 4 mg,the 2 mg every 2 hours (4 mg every 4 hours at night)  maximum: 16 mg/day 60 capsule 2   Multiple Vitamin (MULTIVITAMIN ADULT PO) Take by mouth.     tamsulosin (FLOMAX) 0.4 MG CAPS capsule Take 0.4 mg by mouth at bedtime.     No current facility-administered medications for this visit.   Facility-Administered Medications Ordered in Other Visits  Medication Dose Route Frequency Provider Last Rate Last Admin   acetaminophen (TYLENOL) 325 MG tablet            dexamethasone (DECADRON) 4 MG tablet            diphenhydrAMINE (BENADRYL) 25 mg capsule              PHYSICAL EXAMINATION: ECOG PERFORMANCE STATUS: 1 - Symptomatic but completely ambulatory Vitals:   02/13/23 1026  BP: 122/78  Pulse: 85  Resp: 18  Temp: (!) 96.2 F (35.7 C)  SpO2: 99%   Filed Weights   02/13/23 1026  Weight: 289 lb 4.8 oz (131.2 kg)    Physical Exam Constitutional:      General: He is not in acute distress.    Appearance: He is obese.     Comments: Patient walks with a cane.  HENT:     Head: Normocephalic and atraumatic.  Eyes:     General: No scleral icterus. Cardiovascular:     Rate and Rhythm: Normal rate and regular rhythm.     Heart sounds: Normal heart sounds.  Pulmonary:     Effort: Pulmonary effort is normal. No respiratory distress.     Breath sounds: No wheezing.  Abdominal:     General: Bowel sounds are normal. There is no distension.     Palpations: Abdomen is soft.  Musculoskeletal:        General: No deformity. Normal range of motion.     Cervical back: Normal range of motion and neck supple.     Comments: Trace edema, bilateral low extremities.   Skin:    General: Skin is warm and dry.     Findings: No erythema or rash.  Neurological:      Mental Status: He is alert and oriented to person, place, and time. Mental status is at baseline.     Cranial Nerves: No cranial nerve deficit.     Coordination: Coordination normal.  Psychiatric:        Mood and Affect: Mood normal.      LABORATORY DATA:  I have reviewed the data as listed    Latest Ref Rng & Units 02/13/2023   10:09 AM 01/15/2023   10:18 AM 12/15/2022   11:24 AM  CBC  WBC 4.0 - 10.5 K/uL 5.4  5.6  5.1   Hemoglobin 13.0 - 17.0 g/dL 40.9  81.1  91.4   Hematocrit 39.0 - 52.0 % 38.0  35.4  40.8   Platelets 150 - 400 K/uL 239  215  203       Latest Ref Rng & Units 02/13/2023   10:09 AM 01/15/2023   10:18 AM 12/15/2022   11:24 AM  CMP  Glucose 70 - 99 mg/dL 782  956  95   BUN 8 - 23 mg/dL 14  15  11    Creatinine 0.61 - 1.24 mg/dL 2.13  0.86  5.78   Sodium 135 - 145 mmol/L 138  135  136   Potassium 3.5 - 5.1 mmol/L 3.8  3.9  3.6   Chloride 98 - 111 mmol/L 104  106  106   CO2 22 - 32 mmol/L 25  24  24    Calcium 8.9 - 10.3 mg/dL 9.4  8.8  9.1   Total Protein 6.5 - 8.1 g/dL 7.5  7.0  7.2   Total Bilirubin <1.2 mg/dL 0.4  0.5  0.6   Alkaline Phos 38 - 126 U/L 70  65  69   AST 15 - 41 U/L 17  15  16    ALT 0 - 44 U/L 8  8  8       RADIOGRAPHIC STUDIES: I have personally reviewed the radiological images as listed and agreed with the findings in the report.  No results found.

## 2023-02-14 LAB — KAPPA/LAMBDA LIGHT CHAINS
Kappa free light chain: 27.1 mg/L — ABNORMAL HIGH (ref 3.3–19.4)
Kappa, lambda light chain ratio: 1.78 — ABNORMAL HIGH (ref 0.26–1.65)
Lambda free light chains: 15.2 mg/L (ref 5.7–26.3)

## 2023-02-18 LAB — MULTIPLE MYELOMA PANEL, SERUM
Albumin SerPl Elph-Mcnc: 3.6 g/dL (ref 2.9–4.4)
Albumin/Glob SerPl: 1.1 (ref 0.7–1.7)
Alpha 1: 0.3 g/dL (ref 0.0–0.4)
Alpha2 Glob SerPl Elph-Mcnc: 0.8 g/dL (ref 0.4–1.0)
B-Globulin SerPl Elph-Mcnc: 1.3 g/dL (ref 0.7–1.3)
Gamma Glob SerPl Elph-Mcnc: 1.1 g/dL (ref 0.4–1.8)
Globulin, Total: 3.5 g/dL (ref 2.2–3.9)
IgA: 209 mg/dL (ref 61–437)
IgG (Immunoglobin G), Serum: 1157 mg/dL (ref 603–1613)
IgM (Immunoglobulin M), Srm: 53 mg/dL (ref 20–172)
Total Protein ELP: 7.1 g/dL (ref 6.0–8.5)

## 2023-02-20 DIAGNOSIS — Z9484 Stem cells transplant status: Secondary | ICD-10-CM | POA: Diagnosis not present

## 2023-02-20 DIAGNOSIS — C9001 Multiple myeloma in remission: Secondary | ICD-10-CM | POA: Diagnosis not present

## 2023-02-20 DIAGNOSIS — Z23 Encounter for immunization: Secondary | ICD-10-CM | POA: Diagnosis not present

## 2023-03-05 ENCOUNTER — Other Ambulatory Visit: Payer: Self-pay

## 2023-03-05 DIAGNOSIS — C9000 Multiple myeloma not having achieved remission: Secondary | ICD-10-CM

## 2023-03-05 MED ORDER — LENALIDOMIDE 10 MG PO CAPS: 10.0000 mg | ORAL_CAPSULE | Freq: Every day | ORAL | 6 refills | Status: DC

## 2023-03-13 ENCOUNTER — Inpatient Hospital Stay: Payer: Medicare Other | Attending: Oncology

## 2023-03-13 ENCOUNTER — Encounter: Payer: Self-pay | Admitting: Oncology

## 2023-03-13 ENCOUNTER — Inpatient Hospital Stay: Payer: Medicare Other | Admitting: Oncology

## 2023-03-13 VITALS — BP 134/78 | HR 77 | Temp 96.9°F | Resp 18 | Wt 287.3 lb

## 2023-03-13 DIAGNOSIS — Z9484 Stem cells transplant status: Secondary | ICD-10-CM | POA: Insufficient documentation

## 2023-03-13 DIAGNOSIS — Z79899 Other long term (current) drug therapy: Secondary | ICD-10-CM | POA: Diagnosis not present

## 2023-03-13 DIAGNOSIS — Z7961 Long term (current) use of immunomodulator: Secondary | ICD-10-CM | POA: Diagnosis not present

## 2023-03-13 DIAGNOSIS — C9000 Multiple myeloma not having achieved remission: Secondary | ICD-10-CM

## 2023-03-13 DIAGNOSIS — Z7982 Long term (current) use of aspirin: Secondary | ICD-10-CM | POA: Insufficient documentation

## 2023-03-13 DIAGNOSIS — Z9481 Bone marrow transplant status: Secondary | ICD-10-CM | POA: Diagnosis not present

## 2023-03-13 DIAGNOSIS — Z87891 Personal history of nicotine dependence: Secondary | ICD-10-CM | POA: Diagnosis not present

## 2023-03-13 DIAGNOSIS — M069 Rheumatoid arthritis, unspecified: Secondary | ICD-10-CM | POA: Insufficient documentation

## 2023-03-13 DIAGNOSIS — Z5111 Encounter for antineoplastic chemotherapy: Secondary | ICD-10-CM

## 2023-03-13 DIAGNOSIS — R197 Diarrhea, unspecified: Secondary | ICD-10-CM | POA: Insufficient documentation

## 2023-03-13 DIAGNOSIS — G629 Polyneuropathy, unspecified: Secondary | ICD-10-CM

## 2023-03-13 DIAGNOSIS — T451X5A Adverse effect of antineoplastic and immunosuppressive drugs, initial encounter: Secondary | ICD-10-CM | POA: Diagnosis not present

## 2023-03-13 DIAGNOSIS — Z79624 Long term (current) use of inhibitors of nucleotide synthesis: Secondary | ICD-10-CM | POA: Diagnosis not present

## 2023-03-13 DIAGNOSIS — K521 Toxic gastroenteritis and colitis: Secondary | ICD-10-CM

## 2023-03-13 LAB — CMP (CANCER CENTER ONLY)
ALT: 7 U/L (ref 0–44)
AST: 16 U/L (ref 15–41)
Albumin: 3.8 g/dL (ref 3.5–5.0)
Alkaline Phosphatase: 66 U/L (ref 38–126)
Anion gap: 8 (ref 5–15)
BUN: 12 mg/dL (ref 8–23)
CO2: 23 mmol/L (ref 22–32)
Calcium: 8.9 mg/dL (ref 8.9–10.3)
Chloride: 108 mmol/L (ref 98–111)
Creatinine: 1.04 mg/dL (ref 0.61–1.24)
GFR, Estimated: >60 mL/min (ref >60)
Glucose, Bld: 109 mg/dL — ABNORMAL HIGH (ref 70–99)
Potassium: 3.8 mmol/L (ref 3.5–5.1)
Sodium: 139 mmol/L (ref 135–145)
Total Bilirubin: 0.5 mg/dL (ref <1.2)
Total Protein: 7.2 g/dL (ref 6.5–8.1)

## 2023-03-13 LAB — CBC WITH DIFFERENTIAL (CANCER CENTER ONLY)
Abs Immature Granulocytes: 0.01 10*3/uL (ref 0.00–0.07)
Basophils Absolute: 0.1 10*3/uL (ref 0.0–0.1)
Basophils Relative: 2 %
Eosinophils Absolute: 0.1 10*3/uL (ref 0.0–0.5)
Eosinophils Relative: 2 %
HCT: 37.9 % — ABNORMAL LOW (ref 39.0–52.0)
Hemoglobin: 12.5 g/dL — ABNORMAL LOW (ref 13.0–17.0)
Immature Granulocytes: 0 %
Lymphocytes Relative: 23 %
Lymphs Abs: 1.1 10*3/uL (ref 0.7–4.0)
MCH: 31.6 pg (ref 26.0–34.0)
MCHC: 33 g/dL (ref 30.0–36.0)
MCV: 95.9 fL (ref 80.0–100.0)
Monocytes Absolute: 0.5 10*3/uL (ref 0.1–1.0)
Monocytes Relative: 10 %
Neutro Abs: 3.1 10*3/uL (ref 1.7–7.7)
Neutrophils Relative %: 63 %
Platelet Count: 230 10*3/uL (ref 150–400)
RBC: 3.95 MIL/uL — ABNORMAL LOW (ref 4.22–5.81)
RDW: 15.2 % (ref 11.5–15.5)
WBC Count: 4.9 10*3/uL (ref 4.0–10.5)
nRBC: 0 % (ref 0.0–0.2)

## 2023-03-13 NOTE — Assessment & Plan Note (Signed)
secondary to carpal tunnel disease and chemotherapy Patient did not tolerate gabapentin.  Monitor symptoms-stable

## 2023-03-13 NOTE — Assessment & Plan Note (Signed)
Chemotherapy plan as listed above 

## 2023-03-13 NOTE — Assessment & Plan Note (Addendum)
Encourage oral hydration. Improved after he tried dietary changes. Recommend Imodium as needed per instruction Patient is reluctant to take.

## 2023-03-13 NOTE — Assessment & Plan Note (Addendum)
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 Stable counts. [facility] myeloma/bone marrow transplant notes were reviewed. 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  vaccinations per protocol per  [facility] are reviewed and discussed with patient. Light chain ratio gradually increases.  maintenance with revlimid 10 mg on days 1-21 of 28 day cycle - proceed with this cycle Revlimid -start on 03/13/2023  Zometa  - plan Q3 months to continue to 2 years of therapy [Nov 2025]. next due Feb 2025 Recommend calcium 1200mg  daily with vitamin D supplementation.

## 2023-03-13 NOTE — Progress Notes (Signed)
Hematology/Oncology Progress note [phone] [phone]      CHIEF COMPLAINTS/REASON FOR VISIT:  Follow-up for multiple myeloma   ASSESSMENT & PLAN:   Cancer Staging  Multiple myeloma not having achieved remission (HCC) Staging form: Plasma Cell Myeloma and Plasma Cell Disorders, AJCC 8th Edition - Clinical stage from 04/22/2020: RISS Stage II (Beta-2-microglobulin (mg/L): 2.7, Albumin (g/dL): 3.1, ISS: Stage II, High-risk cytogenetics: Absent, LDH: Normal) - Signed by Rickard Patience, MD on 08/10/2021   Multiple myeloma not having achieved remission (HCC) 0IgA lambda multiple myeloma, M protein 2.3, normal free light chain ratio Status post Dara Rvd, achieved CR, 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 Stable counts. [facility] myeloma/bone marrow transplant notes were reviewed. 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  vaccinations per protocol per  [facility] are reviewed and discussed with patient. Light chain ratio gradually increases.  maintenance with revlimid 10 mg on days 1-21 of 28 day cycle - proceed with this cycle Revlimid -start on 03/13/2023  Zometa  - plan Q3 months to continue to 2 years of therapy [Nov 2025]. next due Feb 2025 Recommend calcium 1200mg  daily with vitamin D supplementation.   Chemotherapy induced diarrhea Encourage oral hydration. Improved after he tried dietary changes. Recommend Imodium as needed per instruction Patient is reluctant to take.    Encounter for antineoplastic chemotherapy Chemotherapy plan as listed above.  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  Monitor symptoms-stable      Orders Placed This Encounter  Procedures   CBC with Differential (Cancer Center Only)    Standing Status:   Future    Standing Expiration Date:   03/12/2024   CMP (Cancer Center only)    Standing Status:   Future    Standing Expiration Date:   03/12/2024   Kappa/lambda light chains    Standing Status:   Future    Standing Expiration Date:   03/12/2024   Multiple Myeloma Panel (SPEP&IFE w/QIG)    Standing Status:   Future    Standing Expiration Date:   03/12/2024    Follow up in 4 weeks.  All questions were answered. The patient knows to call the clinic with any problems, questions or concerns.  Rickard Patience, MD, PhD [facility] Oncology 03/13/2023    HISTORY OF PRESENTING ILLNESS:  Eric Cruz is a 72 y.o. male presents for follow up of myeloma .  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  27% plasma cell, cytogenetics - normal, and myeloma FISH panel is negative.  Standard risk.  Congo red staining was added and was negative.  - 07/27/2021, bone marrow biopsy showed hypercellular marrow involved by plasma cell myeloma, CD138 immunohistochemistry plasma cells compromise approximately 70% of the cellular elements and are lambda restricted by light chain in situ hybridization.  Cytogenetics showed duplication of 1q.  Cytogenetics is normal.   04/22/2020 Cancer Staging   Staging form: Plasma Cell Myeloma and Plasma Cell Disorders, AJCC 8th Edition - Clinical stage from 04/22/2020: RISS Stage II (  Beta-2-microglobulin (mg/L): 2.7, Albumin (g/dL): 3.1, ISS: Stage II, High-risk cytogenetics: Absent, LDH: Normal) - Signed by Rickard Patience, MD on 08/10/2021 Stage prefix: Initial diagnosis Beta 2 microglobulin range (mg/L): Less than 3.5 Albumin  range (g/dL): Less than 3.5 Cytogenetics: 1q addition Lactate dehydrogenase (LDH) (U/L): 136 Serum calcium level: Normal Serum creatinine level: Normal   08/12/2021 Imaging   PET scan showed 1. No evidence of active myeloma on skull base to thigh FDG PET scan. 2. No soft tissue plasmacytoma.3. No lytic or suspicious lesion on CT portion exam.     08/22/2021 - 01/11/2022 Chemotherapy   Patient is on Treatment Plan : MYELOMA NEWLY DIAGNOSED TRANSPLANT CANDIDATE DaraVRd (Daratumumab SQ) q21d x 6 Cycles (Induction/Consolidation)     08/24/2021 - 11/09/2021 Chemotherapy   Patient is on Treatment Plan : MYELOMA NEWLY DIAGNOSED TRANSPLANT CANDIDATE DaraVRd (Daratumumab SQ) q21d x 6 Cycles (Induction/Consolidation)     11/02/2021 Imaging   Bilateral lower extremity US negative for DVT   11/15/2021 Echocardiogram   1. Left ventricular ejection fraction, by estimation, is 60 to 65%. The left ventricle has normal function. The left ventricle has no regional wall motion abnormalities. Left ventricular diastolic parameters are consistent with Grade II diastolic dysfunction (pseudonormalization). The average left ventricular global longitudinal strain is -17.4 %.   2. Right ventricular systolic function is normal. The right ventricular size is normal. There is mildly elevated pulmonary artery systolic pressure. The estimated right ventricular systolic pressure is 37.7 mmHg.   3. The mitral valve is normal in structure. Mild mitral valve regurgitation. No evidence of mitral stenosis.  4. The aortic valve was not well visualized. Aortic valve regurgitation  is not visualized. No aortic stenosis is present.   5. The inferior vena cava is normal in size with greater than 50% respiratory variability, suggesting right atrial pressure of 3 mmHg.    01/23/2022 Bone Marrow Biopsy   Mildly hypercellular bone marrow (40%) with increased erythropoiesis and 2% plasma cells.    02/15/2022 Procedure   Status post  autologous stem cell transplant at [facility] Preparative regimen with Melphalan 200 mg/m2 on 02/14/22 Infusion of stem cells - 6.7 x 10(6) on 02/15/22    06/13/2022 Procedure   Bone marrow biopsy at [facility] showed: Mildly hypercellular bone marrow (40%) with trilineage hematopoiesis and no increase in plasma cells. MRD pending    06/30/2022 -  Chemotherapy   Started on maintenance Revlimid 10mg  D1-21 Q28 days.     For rheumatoid arthritis, patient was seen by Dr. Corliss Skains recently and is currently off methotrexate and Humira.  + Bilateral lower extremity swelling.[place] negative for DVT, rade II diastolic dysfunction one Echo  INTERVAL HISTORY Eric Cruz is a 72 y.o. male who has above history reviewed by me today presents for follow up visit for management of multiple myeloma He reports feeling well.  + intermittent  diarrhea, improved after he changes diet and avoid fried food.  He has been eating healthy, stable weight.  No other new complaints.     Review of Systems  Constitutional:  Negative for appetite change, chills, fatigue, fever and unexpected weight change.  HENT:   Negative for hearing loss and voice change.   Eyes:  Negative for eye problems and icterus.  Respiratory:  Negative for chest tightness, cough and shortness of breath.   Cardiovascular:  Negative for chest pain and leg swelling.  Gastrointestinal:  Negative for abdominal distention and abdominal pain.  Endocrine: Negative for hot flashes.  Genitourinary:  Negative for difficulty urinating, dysuria and frequency.   Musculoskeletal:  Positive for arthralgias and back pain.  Skin:  Negative for itching and rash.  Neurological:  Negative for light-headedness and numbness.  Hematological:  Negative for adenopathy. Does not bruise/bleed easily.  Psychiatric/Behavioral:  Negative for confusion.      MEDICAL HISTORY:  Past Medical History:  Diagnosis Date   Arthritis    BPH (benign  prostatic hyperplasia)    Cancer (HCC)    Prostate: states he had a positive biopsy but had another one a year later and it was gone.    Depression    Dizziness    GERD (gastroesophageal reflux disease)    Gout    High cholesterol    Hypertension    Sleep apnea    has cpap - not currently wearing   Varicose veins     SURGICAL HISTORY: Past Surgical History:  Procedure Laterality Date   ANTERIOR CERVICAL DECOMP/DISCECTOMY FUSION N/A 02/21/2018   Procedure: Cervical Five-Six Cervical Six-Seven Anterior cervical decompression/discectomy/fusion;  Surgeon: Coletta Memos, MD;  Location: [facility] OR;  Service: Neurosurgery;  Laterality: N/A;  Cervical Five-Six Cervical Six-Seven Anterior cervical decompression/discectomy/fusion   BIOPSY  10/09/2019   Procedure: BIOPSY;  Surgeon: Kerin Salen, MD;  Location: [facility];  Service: Gastroenterology;;   BLADDER SURGERY     CARPAL TUNNEL RELEASE Right 02/11/2019   Procedure: Right Carpal Tunnel Wound Irrigation;  Surgeon: Coletta Memos, MD;  Location: [facility] OR;  Service: Neurosurgery;  Laterality: Right;  Right Carpal tunnel wound exploration/wash out   CARPAL TUNNEL RELEASE Bilateral    CHOLECYSTECTOMY  11/22/2012   CHOLECYSTECTOMY  11/22/2012   Procedure: LAPAROSCOPIC CHOLECYSTECTOMY;  Surgeon: Cherylynn Ridges, MD;  Location: [facility] OR;  Service: General;;   COLONOSCOPY     COLONOSCOPY WITH PROPOFOL N/A 10/09/2019   Procedure: COLONOSCOPY WITH PROPOFOL;  Surgeon: Kerin Salen, MD;  Location: [facility];  Service: Gastroenterology;  Laterality: N/A;   ESOPHAGOGASTRODUODENOSCOPY (EGD) WITH PROPOFOL N/A 10/09/2019   Procedure: ESOPHAGOGASTRODUODENOSCOPY (EGD) WITH PROPOFOL;  Surgeon: Kerin Salen, MD;  Location: [facility];  Service: Gastroenterology;  Laterality: N/A;   POLYPECTOMY  10/09/2019   Procedure: POLYPECTOMY;  Surgeon: Kerin Salen, MD;  Location: [facility];  Service: Gastroenterology;;   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    SOCIAL  HISTORY: Social History   Socioeconomic History   Marital status: Married    Spouse name: Not on file   Number of children: 2   Years of education: Not on file   Highest education level: Not on file  Occupational History   Occupation: Retired-mechanic  Tobacco Use   Smoking status: Former    Current packs/day: 0.00    Average packs/day: 1 pack/day for 31.0 years (31.0 ttl pk-yrs)    Types: Cigarettes    Start date: 04/13/1967    Quit date: 04/12/1998    Years since quitting: 24.9    Passive exposure: Never   Smokeless tobacco: Never  Vaping Use   Vaping status: Never Used  Substance and Sexual Activity   Alcohol use: No   Drug use: No   Sexual activity: Not on file  Other Topics Concern   Not on file  Social History Narrative   Not on file   Social Determinants of Health   Financial Resource Strain: Not on file  Food Insecurity: Not on file  Transportation Needs: Not on file  Physical Activity: Not on file  Stress: Not on file  Social Connections: Not on file  Intimate Partner Violence: Not on file    FAMILY HISTORY: Family History  Problem Relation Age of Onset   Hypertension Mother    Diabetes Mother    Multiple myeloma Mother    Hypertension Father    Diabetes Father    Healthy Son    Healthy Daughter     ALLERGIES:  is allergic to atorvastatin, simvastatin, other, and statins.  MEDICATIONS:  Current Outpatient Medications  Medication Sig Dispense Refill   aspirin EC 81 MG tablet Take 81 mg by mouth daily. Swallow whole.     Calcium Carbonate (CALCIUM 600 PO) Take 600 mg by mouth in the morning and at bedtime.     Cholecalciferol (VITAMIN D3) 125 MCG (5000 UT) CAPS Take 1 capsule by mouth daily.     ferrous sulfate 325 (65 FE) MG EC tablet Take 325 mg by mouth 3 (three) times daily with meals.     furosemide (LASIX) 40 MG tablet TAKE 1 TABLET BY MOUTH DAILY 90 tablet 3   lenalidomide (REVLIMID) 10 MG capsule Take 1 capsule (10 mg total) by mouth  daily. Take for 21 days, then hold for 7 days. Repeat every 28 days. 21 capsule 6   loperamide (IMODIUM) 2 MG capsule Take 1 capsule (2 mg total) by mouth See admin instructions. Initial: 4 mg,the 2 mg every 2 hours (4 mg every 4 hours at night)  maximum: 16 mg/day 60 capsule 2   Multiple Vitamin (MULTIVITAMIN ADULT PO) Take by mouth.     tamsulosin (FLOMAX) 0.4 MG CAPS capsule Take 0.4 mg by mouth at bedtime.     acyclovir (ZOVIRAX) 800 MG tablet Take 1 tablet (800 mg total) by mouth 2 (two) times daily. (Patient not taking: Reported on 03/13/2023) 60 tablet 4   No current facility-administered medications for this visit.   Facility-Administered Medications Ordered in Other Visits  Medication Dose Route Frequency Provider Last Rate Last Admin   acetaminophen (TYLENOL) 325 MG tablet            dexamethasone (DECADRON) 4 MG tablet            diphenhydrAMINE (BENADRYL) 25 mg capsule              PHYSICAL EXAMINATION: ECOG PERFORMANCE STATUS: 1 - Symptomatic but completely ambulatory Vitals:   03/13/23 1103  BP: 134/78  Pulse: 77  Resp: 18  Temp: (!) 96.9 F (36.1 C)  SpO2: 100%   Filed Weights   03/13/23 1103  Weight: 287 lb 4.8 oz (130.3 kg)    Physical Exam Constitutional:      General: He is not in acute distress.    Appearance: He is obese.     Comments: Patient walks with a cane.  HENT:     Head: Normocephalic and atraumatic.  Eyes:     General: No scleral icterus. Cardiovascular:     Rate and Rhythm: Normal rate and regular rhythm.     Heart sounds: Normal heart sounds.  Pulmonary:     Effort: Pulmonary effort is normal. No respiratory distress.     Breath sounds: No wheezing.  Abdominal:     General: Bowel sounds are normal. There is no distension.     Palpations: Abdomen is soft.  Musculoskeletal:        General: No deformity. Normal range of motion.     Cervical back: Normal range of motion and neck supple.     Comments: Trace edema, bilateral low  extremities.   Skin:  General: Skin is warm and dry.     Findings: No erythema or rash.  Neurological:     Mental Status: He is alert and oriented to person, place, and time. Mental status is at baseline.     Cranial Nerves: No cranial nerve deficit.     Coordination: Coordination normal.  Psychiatric:        Mood and Affect: Mood normal.      LABORATORY DATA:  I have reviewed the data as listed    Latest Ref Rng & Units 03/13/2023   10:32 AM 02/13/2023   10:09 AM 01/15/2023   10:18 AM  CBC  WBC 4.0 - 10.5 K/uL 4.9  5.4  5.6   Hemoglobin 13.0 - 17.0 g/dL 16.1  09.6  04.5   Hematocrit 39.0 - 52.0 % 37.9  38.0  35.4   Platelets 150 - 400 K/uL 230  239  215       Latest Ref Rng & Units 03/13/2023   10:32 AM 02/13/2023   10:09 AM 01/15/2023   10:18 AM  CMP  Glucose 70 - 99 mg/dL 409  811  914   BUN 8 - 23 mg/dL 12  14  15    Creatinine 0.61 - 1.24 mg/dL 7.82  9.56  2.13   Sodium 135 - 145 mmol/L 139  138  135   Potassium 3.5 - 5.1 mmol/L 3.8  3.8  3.9   Chloride 98 - 111 mmol/L 108  104  106   CO2 22 - 32 mmol/L 23  25  24    Calcium 8.9 - 10.3 mg/dL 8.9  9.4  8.8   Total Protein 6.5 - 8.1 g/dL 7.2  7.5  7.0   Total Bilirubin <1.2 mg/dL 0.5  0.4  0.5   Alkaline Phos 38 - 126 U/L 66  70  65   AST 15 - 41 U/L 16  17  15    ALT 0 - 44 U/L 7  8  8       RADIOGRAPHIC STUDIES: I have personally reviewed the radiological images as listed and agreed with the findings in the report.  No results found.

## 2023-03-14 LAB — KAPPA/LAMBDA LIGHT CHAINS
Kappa free light chain: 29.6 mg/L — ABNORMAL HIGH (ref 3.3–19.4)
Kappa, lambda light chain ratio: 1.72 — ABNORMAL HIGH (ref 0.26–1.65)
Lambda free light chains: 17.2 mg/L (ref 5.7–26.3)

## 2023-03-22 LAB — MULTIPLE MYELOMA PANEL, SERUM
Albumin SerPl Elph-Mcnc: 3.7 g/dL (ref 2.9–4.4)
Albumin/Glob SerPl: 1.3 (ref 0.7–1.7)
Alpha 1: 0.2 g/dL (ref 0.0–0.4)
Alpha2 Glob SerPl Elph-Mcnc: 0.7 g/dL (ref 0.4–1.0)
B-Globulin SerPl Elph-Mcnc: 1.1 g/dL (ref 0.7–1.3)
Gamma Glob SerPl Elph-Mcnc: 1 g/dL (ref 0.4–1.8)
Globulin, Total: 3 g/dL (ref 2.2–3.9)
IgA: 204 mg/dL (ref 61–437)
IgG (Immunoglobin G), Serum: 1208 mg/dL (ref 603–1613)
IgM (Immunoglobulin M), Srm: 48 mg/dL (ref 20–172)
Total Protein ELP: 6.7 g/dL (ref 6.0–8.5)

## 2023-03-26 DIAGNOSIS — R3915 Urgency of urination: Secondary | ICD-10-CM | POA: Diagnosis not present

## 2023-04-08 NOTE — Progress Notes (Signed)
 Cardiology Office Note    Patient Name: Eric Cruz Date of Encounter: 04/09/2023  Primary Care Provider:  Rexanne Ingle, MD Primary Cardiologist:  Lonni Cash, MD Primary Electrophysiologist: None   Past Medical History    Past Medical History:  Diagnosis Date   Arthritis    BPH (benign prostatic hyperplasia)    Cancer [facility])    Prostate: states he had a positive biopsy but had another one a year later and it was gone.    Depression    Dizziness    GERD (gastroesophageal reflux disease)    Gout    High cholesterol    Hypertension    Sleep apnea    has cpap - not currently wearing   Varicose veins     History of Present Illness  Eric Cruz  is a 73 year old male with a PMH of chronic diastolic CHF, tobacco abuse, GERD, HLD, HTN, rheumatoid arthritis, prostate CA, multiple myeloma who presents today for 21-month follow-up.   Eric Cruz was last seen on 10/10/2022 and reported doing well with trace lower extremity edema and controlled BP.  He was euvolemic on examination and was currently undergoing stem cell replacement treatment for multiple myeloma. There were no changes to his current therapy and patient continued to be followed by Dr. Babara for management of multiple myeloma. He was found to have elevation of light chains and was started back on chemotherapy. Notably on amlodipine  and he last completed 2D echo on 11/2021 showing EF of 60 to 65% with no RWMA and grade 2 DD with mild MVR.  Eric Cruz presents today for 6 month follow up.  His blood pressure today was elevated initially at 138/80 reduced slightly to 132/88.   The patient reports being statin intolerant, experiencing cramping with statin use. The patient's cholesterol management has been primarily through dietary changes, with some success. However, the patient admits to occasional indulgence in high-sodium foods, such as hot dogs, which may contribute to elevated cholesterol levels and occasional leg  swelling. The patient also reports a history of GERD and has found relief from symptoms by adjusting sleeping positions and dietary habits. The patient is currently undergoing chemotherapy and reports feeling generally well, with no new complaints. The patient has a history of sleep apnea but does not regularly use a CPAP machine, instead managing symptoms through positional changes during sleep.  Patient denies chest pain, palpitations, dyspnea, PND, orthopnea, nausea, vomiting, dizziness, syncope, edema, weight gain, or early satiety.   Review of Systems  Please see the history of present illness.    All other systems reviewed and are otherwise negative except as noted above.  Physical Exam    Wt Readings from Last 3 Encounters:  04/09/23 288 lb (130.6 kg)  03/13/23 287 lb 4.8 oz (130.3 kg)  02/13/23 289 lb 4.8 oz (131.2 kg)   VS: Vitals:   04/09/23 1134  BP: 138/80  Pulse: 73  SpO2: 97%  ,Body mass index is 43.79 kg/m. GEN: Well nourished, well developed in no acute distress Neck: No JVD; No carotid bruits Pulmonary: Clear to auscultation without rales, wheezing or rhonchi  Cardiovascular: Normal rate. Regular rhythm. Normal S1. Normal S2.   Murmurs: There is no murmur.  ABDOMEN: Soft, non-tender, non-distended EXTREMITIES:  No edema; No deformity   EKG/LABS/ Recent Cardiac Studies   ECG personally reviewed by me today -sinus rhythm with a rate of 73 bpm and no acute changes consistent with previous EKG  Risk Assessment/Calculations:  Lab Results  Component Value Date   WBC 4.9 03/13/2023   HGB 12.5 (L) 03/13/2023   HCT 37.9 (L) 03/13/2023   MCV 95.9 03/13/2023   PLT 230 03/13/2023   Lab Results  Component Value Date   CREATININE 1.04 03/13/2023   BUN 12 03/13/2023   NA 139 03/13/2023   K 3.8 03/13/2023   CL 108 03/13/2023   CO2 23 03/13/2023   Lab Results  Component Value Date   CHOL 225 (H) 10/19/2022   HDL 66 10/19/2022   LDLCALC 129 (H)  10/19/2022   TRIG 150 (H) 10/19/2022   CHOLHDL 3.4 10/19/2022    No results found for: HGBA1C Assessment & Plan    1.  Chronic diastolic CHF: -Patient's last 2D echo was completed in 2023 with mild MR -Today patient is slightly volume up on exam forts indiscretions with salt over the past -Continue Lasix  40 mg daily -Low sodium diet, fluid restriction <2L, and daily weights encouraged. Educated to contact our office for weight gain of 2 lbs overnight or 5 lbs in one week.   2.  Essential hypertension: -Blood pressure slightly elevated at 132/87. Patient reports home monitoring. -Request patient to log blood pressure readings for two weeks and submit via MyChart.  3.  Hyperlipidemia: -LDL 129, goal <70 due to history of cardiac disease. Patient is statin intolerant with reported adverse effects. Discussed the importance of diet modification and exercise. -Refer to clinical pharmacist for discussion of non-statin alternatives such as PCSK9 inhibitors or ezetimibe. -Order cardiac calcium  score to assess for coronary artery disease.  4.  History of multiple myeloma: -Currently chemotherapy treatment and followed by oncology.  5.  Peripheral Edema Patient reports intermittent leg swelling, improved with leg elevation. Discussed potential dietary contributors. -Advise continued leg elevation and moderation of dietary salt intake.   Disposition: Follow-up with Lonni Cash, MD or APP in 6 months    Signed, Wyn Raddle, Jackee Shove, NP 04/09/2023, 11:39 AM [facility]

## 2023-04-09 ENCOUNTER — Other Ambulatory Visit: Payer: Self-pay | Admitting: Oncology

## 2023-04-09 ENCOUNTER — Ambulatory Visit: Payer: Medicare Other | Attending: Nurse Practitioner | Admitting: Nurse Practitioner

## 2023-04-09 ENCOUNTER — Encounter: Payer: Self-pay | Admitting: Nurse Practitioner

## 2023-04-09 VITALS — BP 138/80 | HR 73 | Ht 68.0 in | Wt 288.0 lb

## 2023-04-09 DIAGNOSIS — M7989 Other specified soft tissue disorders: Secondary | ICD-10-CM | POA: Diagnosis not present

## 2023-04-09 DIAGNOSIS — I5032 Chronic diastolic (congestive) heart failure: Secondary | ICD-10-CM | POA: Diagnosis not present

## 2023-04-09 DIAGNOSIS — C9000 Multiple myeloma not having achieved remission: Secondary | ICD-10-CM

## 2023-04-09 DIAGNOSIS — E785 Hyperlipidemia, unspecified: Secondary | ICD-10-CM | POA: Diagnosis not present

## 2023-04-09 DIAGNOSIS — I10 Essential (primary) hypertension: Secondary | ICD-10-CM

## 2023-04-09 NOTE — Patient Instructions (Addendum)
 Medication Instructions:  Your physician recommends that you continue on your current medications as directed. Please refer to the Current Medication list given to you today.  *If you need a refill on your cardiac medications before your next appointment, please call your pharmacy*   Lab Work: None ordered  If you have labs (blood work) drawn today and your tests are completely normal, you will receive your results only by: MyChart Message (if you have MyChart) OR A paper copy in the mail If you have any lab test that is abnormal or we need to change your treatment, we will call you to review the results.   Testing/Procedures: Your physician recommends you have a CT Calcium  Scoring study.  This is a self pay study.   You have been referred to Pharm D for Cholesterol.  Follow-Up: At [facility], you and your health needs are our priority.  As part of our continuing mission to provide you with exceptional heart care, we have created designated Provider Care Teams.  These Care Teams include your primary Cardiologist (physician) and Advanced Practice Providers (APPs -  Physician Assistants and Nurse Practitioners) who all work together to provide you with the care you need, when you need it.  We recommend signing up for the patient portal called MyChart.  Sign up information is provided on this After Visit Summary.  MyChart is used to connect with patients for Virtual Visits (Telemedicine).  Patients are able to view lab/test results, encounter notes, upcoming appointments, etc.  Non-urgent messages can be sent to your provider as well.   To learn more about what you can do with MyChart, go to [email or web address].    Your next appointment:   6 month(s)  Provider:   Lonni Cash, MD     Other Instructions

## 2023-04-10 ENCOUNTER — Inpatient Hospital Stay: Payer: Medicare Other | Attending: Oncology

## 2023-04-10 ENCOUNTER — Inpatient Hospital Stay: Payer: Medicare Other | Admitting: Oncology

## 2023-04-10 VITALS — BP 136/86 | HR 80 | Temp 97.1°F | Wt 287.7 lb

## 2023-04-10 DIAGNOSIS — Z79899 Other long term (current) drug therapy: Secondary | ICD-10-CM | POA: Diagnosis not present

## 2023-04-10 DIAGNOSIS — Z87891 Personal history of nicotine dependence: Secondary | ICD-10-CM | POA: Diagnosis not present

## 2023-04-10 DIAGNOSIS — C9000 Multiple myeloma not having achieved remission: Secondary | ICD-10-CM

## 2023-04-10 DIAGNOSIS — G629 Polyneuropathy, unspecified: Secondary | ICD-10-CM | POA: Diagnosis not present

## 2023-04-10 DIAGNOSIS — Z7982 Long term (current) use of aspirin: Secondary | ICD-10-CM | POA: Diagnosis not present

## 2023-04-10 DIAGNOSIS — Z79624 Long term (current) use of inhibitors of nucleotide synthesis: Secondary | ICD-10-CM | POA: Diagnosis not present

## 2023-04-10 DIAGNOSIS — Z7961 Long term (current) use of immunomodulator: Secondary | ICD-10-CM | POA: Diagnosis not present

## 2023-04-10 DIAGNOSIS — Z9484 Stem cells transplant status: Secondary | ICD-10-CM | POA: Insufficient documentation

## 2023-04-10 DIAGNOSIS — Z9481 Bone marrow transplant status: Secondary | ICD-10-CM | POA: Insufficient documentation

## 2023-04-10 DIAGNOSIS — Z5111 Encounter for antineoplastic chemotherapy: Secondary | ICD-10-CM | POA: Diagnosis not present

## 2023-04-10 LAB — CMP (CANCER CENTER ONLY)
ALT: 9 U/L (ref 0–44)
AST: 19 U/L (ref 15–41)
Albumin: 3.8 g/dL (ref 3.5–5.0)
Alkaline Phosphatase: 66 U/L (ref 38–126)
Anion gap: 8 (ref 5–15)
BUN: 17 mg/dL (ref 8–23)
CO2: 25 mmol/L (ref 22–32)
Calcium: 9.3 mg/dL (ref 8.9–10.3)
Chloride: 103 mmol/L (ref 98–111)
Creatinine: 1.04 mg/dL (ref 0.61–1.24)
GFR, Estimated: >60 mL/min (ref >60)
Glucose, Bld: 104 mg/dL — ABNORMAL HIGH (ref 70–99)
Potassium: 3.8 mmol/L (ref 3.5–5.1)
Sodium: 136 mmol/L (ref 135–145)
Total Bilirubin: 0.5 mg/dL (ref 0.0–1.2)
Total Protein: 7.1 g/dL (ref 6.5–8.1)

## 2023-04-10 LAB — CBC WITH DIFFERENTIAL (CANCER CENTER ONLY)
Abs Immature Granulocytes: 0.01 10*3/uL (ref 0.00–0.07)
Basophils Absolute: 0.1 10*3/uL (ref 0.0–0.1)
Basophils Relative: 1 %
Eosinophils Absolute: 0.2 10*3/uL (ref 0.0–0.5)
Eosinophils Relative: 3 %
HCT: 37.7 % — ABNORMAL LOW (ref 39.0–52.0)
Hemoglobin: 12.6 g/dL — ABNORMAL LOW (ref 13.0–17.0)
Immature Granulocytes: 0 %
Lymphocytes Relative: 21 %
Lymphs Abs: 1.2 10*3/uL (ref 0.7–4.0)
MCH: 31.8 pg (ref 26.0–34.0)
MCHC: 33.4 g/dL (ref 30.0–36.0)
MCV: 95.2 fL (ref 80.0–100.0)
Monocytes Absolute: 0.7 10*3/uL (ref 0.1–1.0)
Monocytes Relative: 12 %
Neutro Abs: 3.5 10*3/uL (ref 1.7–7.7)
Neutrophils Relative %: 63 %
Platelet Count: 238 10*3/uL (ref 150–400)
RBC: 3.96 MIL/uL — ABNORMAL LOW (ref 4.22–5.81)
RDW: 15.1 % (ref 11.5–15.5)
WBC Count: 5.5 10*3/uL (ref 4.0–10.5)
nRBC: 0 % (ref 0.0–0.2)

## 2023-04-10 NOTE — Progress Notes (Signed)
 Pt here for follow up. Pt reports he has been undergoing cardiac work up to rule out a blockage.

## 2023-04-10 NOTE — Progress Notes (Signed)
 Hematology/Oncology Progress note [phone] Fax:([phone]      CHIEF COMPLAINTS/REASON FOR VISIT:  Follow-up for multiple myeloma   ASSESSMENT & PLAN:   Cancer Staging  Multiple myeloma not having achieved remission (HCC) Staging form: Plasma Cell Myeloma and Plasma Cell Disorders, AJCC 8th Edition - Clinical stage from 04/22/2020: RISS Stage II (Beta-2 -microglobulin (mg/L): 2.7, Albumin (g/dL): 3.1, ISS: Stage II, High-risk cytogenetics: Absent, LDH: Normal) - Signed by Babara Call, MD on 08/10/2021   Multiple myeloma not having achieved remission (HCC) 0IgA lambda multiple myeloma, M protein 2.3, normal free light chain ratio Status post Dara Rvd, achieved CR, 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 Stable counts. [facility] myeloma/bone marrow transplant notes were reviewed. Acyclovir  800 mg BID for HSV/VZV 1 year till 02/16/23  Bactrim  DS 1 tab M/W/F for PCP prophylaxis till Day 180, around Mid May bone marrow biopsy-06/13/2022  -Mildly hypercellular bone marrow (40%) with trilineage hematopoiesis and no increase in plasma cells.  vaccinations per protocol per  [facility] are reviewed and discussed with patient. Light chain ratio gradually increases.  maintenance with revlimid  10 mg on days 1-21 of 28 day cycle - proceed with this cycle Revlimid  -start when he gets his supply  Zometa   - plan Q3 months to continue to 2 years of therapy [Nov 2025]. next due Feb 2025 Recommend calcium  1200mg  daily with vitamin D supplementation.   Encounter for antineoplastic chemotherapy Chemotherapy plan as listed above.  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  Monitor symptoms-stable      Orders Placed This Encounter  Procedures   CBC with Differential (Cancer Center Only)    Standing Status:   Future     Expected Date:   05/15/2023    Expiration Date:   04/09/2024   CMP (Cancer Center only)    Standing Status:   Future    Expected Date:   05/15/2023    Expiration Date:   04/09/2024   Multiple Myeloma Panel (SPEP&IFE w/QIG)    Standing Status:   Future    Expected Date:   05/15/2023    Expiration Date:   04/09/2024   Kappa/lambda light chains    Standing Status:   Future    Expected Date:   05/15/2023    Expiration Date:   04/09/2024    Follow up in 4 weeks.  All questions were answered. The patient knows to call the clinic with any problems, questions or concerns.  Call Babara, MD, PhD [facility] H[facility]ology Oncology 04/10/2023    HISTORY OF PRESENTING ILLNESS:  Eric Cruz is a 73 y.o. male presents for follow up of myeloma .  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  27% plasma cell, cytogenetics - normal, and myeloma FISH panel is negative.  Standard risk.  Congo red staining was added and was negative.  - 07/27/2021, bone marrow biopsy showed hypercellular marrow involved by plasma cell myeloma, CD138 immunohistochemistry plasma cells compromise approximately 70% of the cellular elements and are lambda restricted by light chain in situ hybridization.  Cytogenetics showed duplication of 1q.  Cytogenetics is normal.   04/22/2020 Cancer Staging   Staging form: Plasma Cell Myeloma and Plasma Cell Disorders, AJCC 8th Edition - Clinical stage  from 04/22/2020: RISS Stage II (Beta-2 -microglobulin (mg/L): 2.7, Albumin (g/dL): 3.1, ISS: Stage II, High-risk cytogenetics: Absent, LDH: Normal) - Signed by Babara Call, MD on 08/10/2021 Stage prefix: Initial diagnosis Beta 2 microglobulin range (mg/L): Less than 3.5 Albumin range (g/dL): Less than 3.5 Cytogenetics: 1q addition Lactate dehydrogenase (LDH) (U/L):  136 Serum calcium  level: Normal Serum creatinine level: Normal   08/12/2021 Imaging   PET scan showed 1. No evidence of active myeloma on skull base to thigh FDG PET scan. 2. No soft tissue plasmacytoma.3. No lytic or suspicious lesion on CT portion exam.     08/22/2021 - 01/11/2022 Chemotherapy   Patient is on Treatment Plan : MYELOMA NEWLY DIAGNOSED TRANSPLANT CANDIDATE DaraVRd (Daratumumab  SQ) q21d x 6 Cycles (Induction/Consolidation)     08/24/2021 - 11/09/2021 Chemotherapy   Patient is on Treatment Plan : MYELOMA NEWLY DIAGNOSED TRANSPLANT CANDIDATE DaraVRd (Daratumumab  SQ) q21d x 6 Cycles (Induction/Consolidation)     11/02/2021 Imaging   Bilateral lower extremity US  negative for DVT   11/15/2021 Echocardiogram   1. Left ventricular ejection fraction, by estimation, is 60 to 65%. The left ventricle has normal function. The left ventricle has no regional wall motion abnormalities. Left ventricular diastolic parameters are consistent with Grade II diastolic dysfunction (pseudonormalization). The average left ventricular global longitudinal strain is -17.4 %.   2. Right ventricular systolic function is normal. The right ventricular size is normal. There is mildly elevated pulmonary artery systolic pressure. The estimated right ventricular systolic pressure is 37.7 mmHg.   3. The mitral valve is normal in structure. Mild mitral valve regurgitation. No evidence of mitral stenosis.  4. The aortic valve was not well visualized. Aortic valve regurgitation  is not visualized. No aortic stenosis is present.   5. The inferior vena cava is normal in size with greater than 50% respiratory variability, suggesting right atrial pressure of 3 mmHg.    01/23/2022 Bone Marrow Biopsy   Mildly hypercellular bone marrow (40%) with increased erythropoiesis and 2% plasma cells.    02/15/2022 Procedure   Status post autologous stem cell transplant at [facility] Preparative regimen with  Melphalan 200 mg/m2 on 02/14/22 Infusion of stem cells - 6.7 x 10(6) on 02/15/22    06/13/2022 Procedure   Bone marrow biopsy at [facility] showed: Mildly hypercellular bone marrow (40%) with trilineage hematopoiesis and no increase in plasma cells. MRD pending    06/30/2022 -  Chemotherapy   Started on maintenance Revlimid  10mg  D1-21 Q28 days.     For rheumatoid arthritis, patient was seen by Dr. Dolphus recently and is currently off methotrexate  and Humira .  + Bilateral lower extremity swelling.US  negative for DVT, rade II diastolic dysfunction one Echo  INTERVAL HISTORY Eric Cruz is a 73 y.o. male who has above history reviewed by me today presents for follow up visit for management of multiple myeloma He reports feeling well.  + intermittent  diarrhea, improved after he changes diet and avoid fried food.  He has been eating healthy, stable weight.  No other new complaints. He has not received his Revlimid  supply yet.     Review of Systems  Constitutional:  Negative for appetite change, chills, fatigue, fever and unexpected weight change.  HENT:   Negative for hearing loss and voice change.   Eyes:  Negative for eye problems and icterus.  Respiratory:  Negative for chest tightness, cough and shortness of breath.   Cardiovascular:  Negative for chest pain and leg swelling.  Gastrointestinal:  Negative  for abdominal distention and abdominal pain.  Endocrine: Negative for hot flashes.  Genitourinary:  Negative for difficulty urinating, dysuria and frequency.   Musculoskeletal:  Positive for arthralgias and back pain.  Skin:  Negative for itching and rash.  Neurological:  Negative for light-headedness and numbness.  Hematological:  Negative for adenopathy. Does not bruise/bleed easily.  Psychiatric/Behavioral:  Negative for confusion.      MEDICAL HISTORY:  Past Medical History:  Diagnosis Date   Arthritis    BPH (benign prostatic hyperplasia)    Cancer (HCC)     Prostate: states he had a positive biopsy but had another one a year later and it was gone.    Depression    Dizziness    GERD (gastroesophageal reflux disease)    Gout    High cholesterol    Hypertension    Sleep apnea    has cpap - not currently wearing   Varicose veins     SURGICAL HISTORY: Past Surgical History:  Procedure Laterality Date   ANTERIOR CERVICAL DECOMP/DISCECTOMY FUSION N/A 02/21/2018   Procedure: Cervical Five-Six Cervical Six-Seven Anterior cervical decompression/discectomy/fusion;  Surgeon: Gillie Duncans, MD;  Location: [facility] OR;  Service: Neurosurgery;  Laterality: N/A;  Cervical Five-Six Cervical Six-Seven Anterior cervical decompression/discectomy/fusion   BIOPSY  10/09/2019   Procedure: BIOPSY;  Surgeon: Saintclair Jasper, MD;  Location: [facility] ENDOSCOPY;  Service: Gastroenterology;;   BLADDER SURGERY     CARPAL TUNNEL RELEASE Right 02/11/2019   Procedure: Right Carpal Tunnel Wound Irrigation;  Surgeon: Gillie Duncans, MD;  Location: [facility] OR;  Service: Neurosurgery;  Laterality: Right;  Right Carpal tunnel wound exploration/wash out   CARPAL TUNNEL RELEASE Bilateral    CHOLECYSTECTOMY  11/22/2012   CHOLECYSTECTOMY  11/22/2012   Procedure: LAPAROSCOPIC CHOLECYSTECTOMY;  Surgeon: Lynwood MALVA Pina, MD;  Location: [facility] OR;  Service: General;;   COLONOSCOPY     COLONOSCOPY WITH PROPOFOL  N/A 10/09/2019   Procedure: COLONOSCOPY WITH PROPOFOL ;  Surgeon: Saintclair Jasper, MD;  Location: [facility];  Service: Gastroenterology;  Laterality: N/A;   ESOPHAGOGASTRODUODENOSCOPY (EGD) WITH PROPOFOL  N/A 10/09/2019   Procedure: ESOPHAGOGASTRODUODENOSCOPY (EGD) WITH PROPOFOL ;  Surgeon: Saintclair Jasper, MD;  Location: [facility];  Service: Gastroenterology;  Laterality: N/A;   POLYPECTOMY  10/09/2019   Procedure: POLYPECTOMY;  Surgeon: Saintclair Jasper, MD;  Location: [facility];  Service: Gastroenterology;;   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    SOCIAL HISTORY: Social History   Socioeconomic History    Marital status: Married    Spouse name: Not on file   Number of children: 2   Years of education: Not on file   Highest education level: Not on file  Occupational History   Occupation: Retired-mechanic  Tobacco Use   Smoking status: Former    Current packs/day: 0.00    Average packs/day: 1 pack/day for 31.0 years (31.0 ttl pk-yrs)    Types: Cigarettes    Start date: 04/13/1967    Quit date: 04/12/1998    Years since quitting: 25.0    Passive exposure: Never   Smokeless tobacco: Never  Vaping Use   Vaping status: Never Used  Substance and Sexual Activity   Alcohol use: No   Drug use: No   Sexual activity: Not on file  Other Topics Concern   Not on file  Social History Narrative   Not on file   Social Drivers of Health   Financial Resource Strain: Not on file  Food Insecurity: Not on file  Transportation Needs: Not on file  Physical Activity: Not  on file  Stress: Not on file  Social Connections: Not on file  Intimate Partner Violence: Not on file    FAMILY HISTORY: Family History  Problem Relation Age of Onset   Hypertension Mother    Diabetes Mother    Multiple myeloma Mother    Hypertension Father    Diabetes Father    Healthy Son    Healthy Daughter     ALLERGIES:  is allergic to atorvastatin, simvastatin, other, and statins.  MEDICATIONS:  Current Outpatient Medications  Medication Sig Dispense Refill   aspirin  EC 81 MG tablet Take 81 mg by mouth daily. Swallow whole.     Calcium  Carbonate (CALCIUM  600 PO) Take 600 mg by mouth in the morning and at bedtime.     ferrous sulfate 325 (65 FE) MG EC tablet Take 325 mg by mouth daily at 6 (six) AM.     finasteride  (PROSCAR ) 5 MG tablet Take 5 mg by mouth daily.     furosemide  (LASIX ) 40 MG tablet TAKE 1 TABLET BY MOUTH DAILY 90 tablet 3   lenalidomide  (REVLIMID ) 10 MG capsule Take 1 capsule by mouth once daily for 21 days, then 7 days off. 21 capsule 0   Multiple Vitamin (MULTIVITAMIN ADULT PO) Take by mouth.      tamsulosin  (FLOMAX ) 0.4 MG CAPS capsule Take 0.4 mg by mouth at bedtime.     Turmeric 500 MG CAPS Take by mouth daily at 6 (six) AM.     Vitamin D-Vitamin K (K2 PLUS D3 PO) Take 500 tablets by mouth daily at 6 (six) AM.     No current facility-administered medications for this visit.   Facility-Administered Medications Ordered in Other Visits  Medication Dose Route Frequency Provider Last Rate Last Admin   acetaminophen  (TYLENOL ) 325 MG tablet            dexamethasone  (DECADRON ) 4 MG tablet            diphenhydrAMINE  (BENADRYL ) 25 mg capsule              PHYSICAL EXAMINATION: ECOG PERFORMANCE STATUS: 1 - Symptomatic but completely ambulatory Vitals:   04/10/23 1101  BP: 136/86  Pulse: 80  Temp: (!) 97.1 F (36.2 C)  SpO2: 95%   Filed Weights   04/10/23 1101  Weight: 287 lb 11.2 oz (130.5 kg)    Physical Exam Constitutional:      General: He is not in acute distress.    Appearance: He is obese.     Comments: Patient walks with a cane.  HENT:     Head: Normocephalic and atraumatic.  Eyes:     General: No scleral icterus. Cardiovascular:     Rate and Rhythm: Normal rate and regular rhythm.     Heart sounds: Normal heart sounds.  Pulmonary:     Effort: Pulmonary effort is normal. No respiratory distress.     Breath sounds: No wheezing.  Abdominal:     General: Bowel sounds are normal. There is no distension.     Palpations: Abdomen is soft.  Musculoskeletal:        General: No deformity. Normal range of motion.     Cervical back: Normal range of motion and neck supple.     Comments: Trace edema, bilateral low extremities.   Skin:    General: Skin is warm and dry.     Findings: No erythema or rash.  Neurological:     Mental Status: He is alert and oriented to person, place, and  time. Mental status is at baseline.     Cranial Nerves: No cranial nerve deficit.     Coordination: Coordination normal.  Psychiatric:        Mood and Affect: Mood normal.       LABORATORY DATA:  I have reviewed the data as listed    Latest Ref Rng & Units 04/10/2023   10:43 AM 03/13/2023   10:32 AM 02/13/2023   10:09 AM  CBC  WBC 4.0 - 10.5 K/uL 5.5  4.9  5.4   Hemoglobin 13.0 - 17.0 g/dL 87.3  87.4  87.4   Hematocrit 39.0 - 52.0 % 37.7  37.9  38.0   Platelets 150 - 400 K/uL 238  230  239       Latest Ref Rng & Units 04/10/2023   10:43 AM 03/13/2023   10:32 AM 02/13/2023   10:09 AM  CMP  Glucose 70 - 99 mg/dL 895  890  899   BUN 8 - 23 mg/dL 17  12  14    Creatinine 0.61 - 1.24 mg/dL 8.95  8.95  9.19   Sodium 135 - 145 mmol/L 136  139  138   Potassium 3.5 - 5.1 mmol/L 3.8  3.8  3.8   Chloride 98 - 111 mmol/L 103  108  104   CO2 22 - 32 mmol/L 25  23  25    Calcium  8.9 - 10.3 mg/dL 9.3  8.9  9.4   Total Protein 6.5 - 8.1 g/dL 7.1  7.2  7.5   Total Bilirubin 0.0 - 1.2 mg/dL 0.5  0.5  0.4   Alkaline Phos 38 - 126 U/L 66  66  70   AST 15 - 41 U/L 19  16  17    ALT 0 - 44 U/L 9  7  8       RADIOGRAPHIC STUDIES: I have personally reviewed the radiological images as listed and agreed with the findings in the report.  No results found.

## 2023-04-10 NOTE — Assessment & Plan Note (Signed)
 secondary to carpal tunnel disease and chemotherapy Patient did not tolerate gabapentin.  Monitor symptoms-stable

## 2023-04-10 NOTE — Assessment & Plan Note (Addendum)
 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 Stable counts. [facility] myeloma/bone marrow transplant notes were reviewed. Acyclovir  800 mg BID for HSV/VZV 1 year till 02/16/23  Bactrim  DS 1 tab M/W/F for PCP prophylaxis till Day 180, around Mid May bone marrow biopsy-06/13/2022  -Mildly hypercellular bone marrow (40%) with trilineage hematopoiesis and no increase in plasma cells.  vaccinations per protocol per  [facility] are reviewed and discussed with patient. Light chain ratio gradually increases.  maintenance with revlimid  10 mg on days 1-21 of 28 day cycle - proceed with this cycle Revlimid  -start when he gets his supply  Zometa   - plan Q3 months to continue to 2 years of therapy [Nov 2025]. next due Feb 2025 Recommend calcium  1200mg  daily with vitamin D supplementation.

## 2023-04-10 NOTE — Assessment & Plan Note (Signed)
 Chemotherapy plan as listed above

## 2023-04-11 LAB — KAPPA/LAMBDA LIGHT CHAINS
Kappa free light chain: 31.1 mg/L — ABNORMAL HIGH (ref 3.3–19.4)
Kappa, lambda light chain ratio: 1.87 — ABNORMAL HIGH (ref 0.26–1.65)
Lambda free light chains: 16.6 mg/L (ref 5.7–26.3)

## 2023-04-17 LAB — MULTIPLE MYELOMA PANEL, SERUM
Albumin SerPl Elph-Mcnc: 3.5 g/dL (ref 2.9–4.4)
Albumin/Glob SerPl: 1.1 (ref 0.7–1.7)
Alpha 1: 0.2 g/dL (ref 0.0–0.4)
Alpha2 Glob SerPl Elph-Mcnc: 0.6 g/dL (ref 0.4–1.0)
B-Globulin SerPl Elph-Mcnc: 1.1 g/dL (ref 0.7–1.3)
Gamma Glob SerPl Elph-Mcnc: 1.2 g/dL (ref 0.4–1.8)
Globulin, Total: 3.2 g/dL (ref 2.2–3.9)
IgA: 221 mg/dL (ref 61–437)
IgG (Immunoglobin G), Serum: 1276 mg/dL (ref 603–1613)
IgM (Immunoglobulin M), Srm: 43 mg/dL (ref 20–172)
Total Protein ELP: 6.7 g/dL (ref 6.0–8.5)

## 2023-04-23 ENCOUNTER — Ambulatory Visit (HOSPITAL_COMMUNITY)
Admission: RE | Admit: 2023-04-23 | Discharge: 2023-04-23 | Disposition: A | Discharge status: Home / Self Care | Payer: Self-pay | Source: Ambulatory Visit | Attending: Nurse Practitioner | Admitting: Nurse Practitioner

## 2023-04-23 DIAGNOSIS — I5032 Chronic diastolic (congestive) heart failure: Secondary | ICD-10-CM | POA: Insufficient documentation

## 2023-05-02 ENCOUNTER — Other Ambulatory Visit: Payer: Self-pay

## 2023-05-02 ENCOUNTER — Other Ambulatory Visit: Payer: Self-pay | Admitting: Oncology

## 2023-05-02 ENCOUNTER — Telehealth: Payer: Self-pay | Admitting: *Deleted

## 2023-05-02 DIAGNOSIS — C9000 Multiple myeloma not having achieved remission: Secondary | ICD-10-CM

## 2023-05-02 NOTE — Telephone Encounter (Signed)
Lenalidomide 10 mg Rx sent to [facility]   REMS: [id number] Date obtained: 05/02/23

## 2023-05-15 ENCOUNTER — Encounter: Payer: Self-pay | Admitting: Oncology

## 2023-05-15 ENCOUNTER — Inpatient Hospital Stay: Payer: Medicare Other

## 2023-05-15 ENCOUNTER — Inpatient Hospital Stay: Payer: Medicare Other | Attending: Oncology | Admitting: Oncology

## 2023-05-15 VITALS — BP 129/85 | HR 79 | Temp 97.9°F | Resp 18 | Wt 285.2 lb

## 2023-05-15 DIAGNOSIS — Z87891 Personal history of nicotine dependence: Secondary | ICD-10-CM | POA: Diagnosis not present

## 2023-05-15 DIAGNOSIS — T451X5A Adverse effect of antineoplastic and immunosuppressive drugs, initial encounter: Secondary | ICD-10-CM

## 2023-05-15 DIAGNOSIS — Z5111 Encounter for antineoplastic chemotherapy: Secondary | ICD-10-CM

## 2023-05-15 DIAGNOSIS — G629 Polyneuropathy, unspecified: Secondary | ICD-10-CM

## 2023-05-15 DIAGNOSIS — Z9484 Stem cells transplant status: Secondary | ICD-10-CM | POA: Diagnosis not present

## 2023-05-15 DIAGNOSIS — C9000 Multiple myeloma not having achieved remission: Secondary | ICD-10-CM

## 2023-05-15 DIAGNOSIS — M069 Rheumatoid arthritis, unspecified: Secondary | ICD-10-CM | POA: Insufficient documentation

## 2023-05-15 DIAGNOSIS — K521 Toxic gastroenteritis and colitis: Secondary | ICD-10-CM | POA: Diagnosis not present

## 2023-05-15 DIAGNOSIS — Z9481 Bone marrow transplant status: Secondary | ICD-10-CM | POA: Diagnosis not present

## 2023-05-15 LAB — CBC WITH DIFFERENTIAL (CANCER CENTER ONLY)
Abs Immature Granulocytes: 0.02 10*3/uL (ref 0.00–0.07)
Basophils Absolute: 0.1 10*3/uL (ref 0.0–0.1)
Basophils Relative: 1 %
Eosinophils Absolute: 0.1 10*3/uL (ref 0.0–0.5)
Eosinophils Relative: 1 %
HCT: 38.4 % — ABNORMAL LOW (ref 39.0–52.0)
Hemoglobin: 12.5 g/dL — ABNORMAL LOW (ref 13.0–17.0)
Immature Granulocytes: 0 %
Lymphocytes Relative: 18 %
Lymphs Abs: 1.1 10*3/uL (ref 0.7–4.0)
MCH: 31.5 pg (ref 26.0–34.0)
MCHC: 32.6 g/dL (ref 30.0–36.0)
MCV: 96.7 fL (ref 80.0–100.0)
Monocytes Absolute: 0.5 10*3/uL (ref 0.1–1.0)
Monocytes Relative: 9 %
Neutro Abs: 4.3 10*3/uL (ref 1.7–7.7)
Neutrophils Relative %: 71 %
Platelet Count: 247 10*3/uL (ref 150–400)
RBC: 3.97 MIL/uL — ABNORMAL LOW (ref 4.22–5.81)
RDW: 14.9 % (ref 11.5–15.5)
WBC Count: 6.2 10*3/uL (ref 4.0–10.5)
nRBC: 0 % (ref 0.0–0.2)

## 2023-05-15 LAB — CMP (CANCER CENTER ONLY)
ALT: 7 U/L (ref 0–44)
AST: 16 U/L (ref 15–41)
Albumin: 3.9 g/dL (ref 3.5–5.0)
Alkaline Phosphatase: 71 U/L (ref 38–126)
Anion gap: 8 (ref 5–15)
BUN: 17 mg/dL (ref 8–23)
CO2: 26 mmol/L (ref 22–32)
Calcium: 9.1 mg/dL (ref 8.9–10.3)
Chloride: 103 mmol/L (ref 98–111)
Creatinine: 1.04 mg/dL (ref 0.61–1.24)
GFR, Estimated: >60 mL/min (ref >60)
Glucose, Bld: 102 mg/dL — ABNORMAL HIGH (ref 70–99)
Potassium: 4.1 mmol/L (ref 3.5–5.1)
Sodium: 137 mmol/L (ref 135–145)
Total Bilirubin: 0.4 mg/dL (ref 0.0–1.2)
Total Protein: 7.5 g/dL (ref 6.5–8.1)

## 2023-05-15 MED ORDER — ZOLEDRONIC ACID 4 MG/100ML IV SOLN
4.0000 mg | Freq: Once | INTRAVENOUS | Status: AC
  Administered 2023-05-15: 4 mg via INTRAVENOUS
  Filled 2023-05-15: qty 100

## 2023-05-15 MED ORDER — SODIUM CHLORIDE 0.9 % IV SOLN
Freq: Once | INTRAVENOUS | Status: AC
  Filled 2023-05-15: qty 250

## 2023-05-15 NOTE — Progress Notes (Signed)
Pt her for follow up. Pt reports that fingers lock up, especially at night. It has been going on or approx 3 weeks and its getting worse.

## 2023-05-15 NOTE — Progress Notes (Signed)
[facility] Hematology/Oncology Progress note [phone] [phone]      CHIEF COMPLAINTS/REASON FOR VISIT:  Follow-up for multiple myeloma   ASSESSMENT & PLAN:   Cancer Staging  Multiple myeloma not having achieved remission (HCC) Staging form: Plasma Cell Myeloma and Plasma Cell Disorders, AJCC 8th Edition - Clinical stage from 04/22/2020: RISS Stage II (Beta-2-microglobulin (mg/L): 2.7, Albumin (g/dL): 3.1, ISS: Stage II, High-risk cytogenetics: Absent, LDH: Normal) - Signed by Rickard Patience, MD on 08/10/2021   Multiple myeloma not having achieved remission (HCC) 0IgA lambda multiple myeloma, M protein 2.3, normal free light chain ratio Status post Dara Rvd, achieved CR, 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 Stable counts. [facility] myeloma/bone marrow transplant notes were reviewed. 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  vaccinations per protocol per  [facility] are reviewed and discussed with patient. Light chain ratio gradually increases.  maintenance with revlimid 10 mg on days 1-21 of 28 day cycle - proceed with this cycle Revlimid -  Zometa  - plan Q3 months to continue to 2 years of therapy [until Nov 2025].  Patient will receive today, next due May 2025 Recommend calcium 1200mg  daily with vitamin D supplementation.   Rheumatoid arthritis (HCC)  Currently he is off Humira and methotrexate.  He has pain in bilateral upper extremities follow up with rheumatology.    Encounter for antineoplastic chemotherapy Chemotherapy plan as listed above.  Chemotherapy induced diarrhea Encourage oral hydration. Improved after he tried  dietary changes. Recommend Imodium as needed per instruction Patient is reluctant to take. Symptoms are stable to improved.    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  Monitor symptoms-stable      Orders Placed This Encounter  Procedures   CMP (Cancer Center only)    Standing Status:   Future    Expected Date:   06/12/2023    Expiration Date:   05/14/2024   CBC with Differential (Cancer Center Only)    Standing Status:   Future    Expected Date:   06/12/2023    Expiration Date:   05/14/2024   Kappa/lambda light chains    Standing Status:   Future    Expected Date:   06/12/2023    Expiration Date:   05/14/2024   Multiple Myeloma Panel (SPEP&IFE w/QIG)    Standing Status:   Future    Expected Date:   06/12/2023    Expiration Date:   05/14/2024    Follow up in 4 weeks.  All questions were answered. The patient knows to call the clinic with any problems, questions or concerns.  Rickard Patience, MD, PhD [facility] Hematology Oncology 05/15/2023    HISTORY OF PRESENTING ILLNESS:  Eric Cruz is a 73 y.o. male presents for follow up of myeloma .  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  27% plasma cell, cytogenetics - normal, and myeloma FISH panel is negative.  Standard risk.  [place]ongo red staining was added and was negative.  - 07/27/2021, bone marrow  biopsy showed hypercellular marrow involved by plasma cell myeloma, CD138 immunohistochemistry plasma cells compromise approximately 70% of the cellular elements and are lambda restricted by light chain in situ hybridization.  Cytogenetics showed duplication of 1q.  Cytogenetics is normal.   04/22/2020 Cancer Staging   Staging form: Plasma Cell Myeloma and Plasma Cell Disorders, AJCC  8th Edition - Clinical stage from 04/22/2020: RISS Stage II (Beta-2-microglobulin (mg/L): 2.7, Albumin (g/dL): 3.1, ISS: Stage II, High-risk cytogenetics: Absent, LDH: Normal) - Signed by Rickard Patience, MD on 08/10/2021 Stage prefix: Initial diagnosis Beta 2 microglobulin range (mg/L): Less than 3.5 Albumin range (g/dL): Less than 3.5 Cytogenetics: 1q addition Lactate dehydrogenase (LDH) (U/L): 136 Serum calcium level: Normal Serum creatinine level: Normal   08/12/2021 Imaging   PET scan showed 1. No evidence of active myeloma on skull base to thigh FDG PET scan. 2. No soft tissue plasmacytoma.3. No lytic or suspicious lesion on CT portion exam.     08/22/2021 - 01/11/2022 Chemotherapy   Patient is on Treatment Plan : MYELOMA NEWLY DIAGNOSED TRANSPLANT CANDIDATE DaraVRd (Daratumumab SQ) q21d x 6 Cycles (Induction/Consolidation)     08/24/2021 - 11/09/2021 Chemotherapy   Patient is on Treatment Plan : MYELOMA NEWLY DIAGNOSED TRANSPLANT CANDIDATE DaraVRd (Daratumumab SQ) q21d x 6 Cycles (Induction/Consolidation)     11/02/2021 Imaging   Bilateral lower extremity US negative for DVT   11/15/2021 Echocardiogram   1. Left ventricular ejection fraction, by estimation, is 60 to 65%. The left ventricle has normal function. The left ventricle has no regional wall motion abnormalities. Left ventricular diastolic parameters are consistent with Grade II diastolic dysfunction (pseudonormalization). The average left ventricular global longitudinal strain is -17.4 %.   2. Right ventricular systolic function is normal. The right ventricular size is normal. There is mildly elevated pulmonary artery systolic pressure. The estimated right ventricular systolic pressure is 37.7 mmHg.   3. The mitral valve is normal in structure. Mild mitral valve regurgitation. No evidence of mitral stenosis.  4. The aortic valve was not well visualized. Aortic valve regurgitation  is not visualized. No aortic stenosis is present.   5.  The inferior vena cava is normal in size with greater than 50% respiratory variability, suggesting right atrial pressure of 3 mmHg.    01/23/2022 Bone Marrow Biopsy   Mildly hypercellular bone marrow (40%) with increased erythropoiesis and 2% plasma cells.    02/15/2022 Procedure   Status post autologous stem cell transplant at [facility] Preparative regimen with Melphalan 200 mg/m2 on 02/14/22 Infusion of stem cells - 6.7 x 10(6) on 02/15/22    06/13/2022 Procedure   Bone marrow biopsy at [facility] showed: Mildly hypercellular bone marrow (40%) with trilineage hematopoiesis and no increase in plasma cells. MRD pending    06/30/2022 -  Chemotherapy   Started on maintenance Revlimid 10mg  D1-21 Q28 days.     For rheumatoid arthritis, patient was seen by Dr. Corliss Skains recently and is currently off methotrexate and Humira.  + Bilateral lower extremity swelling.[place] negative for DVT, rade II diastolic dysfunction one Echo  INTERVAL HISTORY TRACKER MANCE is a 73 y.o. male who has above history reviewed by me today presents for follow up visit for management of multiple myeloma He reports feeling well.  He has been eating healthy, stable weight.  Patient reports bilateral upper extremity muscle pain    Review of Systems  Constitutional:  Negative for appetite change, chills, fatigue, fever and unexpected weight change.  HENT:   Negative for  hearing loss and voice change.   Eyes:  Negative for eye problems and icterus.  Respiratory:  Negative for chest tightness, cough and shortness of breath.   Cardiovascular:  Negative for chest pain and leg swelling.  Gastrointestinal:  Negative for abdominal distention and abdominal pain.  Endocrine: Negative for hot flashes.  Genitourinary:  Negative for difficulty urinating, dysuria and frequency.   Musculoskeletal:  Positive for arthralgias, back pain and myalgias.  Skin:  Negative for itching and rash.  Neurological:   Negative for light-headedness and numbness.  Hematological:  Negative for adenopathy. Does not bruise/bleed easily.  Psychiatric/Behavioral:  Negative for confusion.      MEDICAL HISTORY:  Past Medical History:  Diagnosis Date   Arthritis    BPH (benign prostatic hyperplasia)    Cancer (HCC)    Prostate: states he had a positive biopsy but had another one a year later and it was gone.    Depression    Dizziness    GERD (gastroesophageal reflux disease)    Gout    High cholesterol    Hypertension    Sleep apnea    has cpap - not currently wearing   Varicose veins     SURGICAL HISTORY: Past Surgical History:  Procedure Laterality Date   ANTERIOR CERVICAL DECOMP/DISCECTOMY FUSION N/A 02/21/2018   Procedure: Cervical Five-Six Cervical Six-Seven Anterior cervical decompression/discectomy/fusion;  Surgeon: Coletta Memos, MD;  Location: [facility] OR;  Service: Neurosurgery;  Laterality: N/A;  Cervical Five-Six Cervical Six-Seven Anterior cervical decompression/discectomy/fusion   BIOPSY  10/09/2019   Procedure: BIOPSY;  Surgeon: Kerin Salen, MD;  Location: [facility];  Service: Gastroenterology;;   BLADDER SURGERY     CARPAL TUNNEL RELEASE Right 02/11/2019   Procedure: Right Carpal Tunnel Wound Irrigation;  Surgeon: Coletta Memos, MD;  [facility];  Service: Neurosurgery;  Laterality: Right;  Right Carpal tunnel wound exploration/wash out   CARPAL TUNNEL RELEASE Bilateral    CHOLECYSTECTOMY  11/22/2012   CHOLECYSTECTOMY  11/22/2012   Procedure: LAPAROSCOPIC CHOLECYSTECTOMY;  Surgeon: Cherylynn Ridges, MD;  Location: [facility] OR;  Service: General;;   COLONOSCOPY     COLONOSCOPY WITH PROPOFOL N/A 10/09/2019   Procedure: COLONOSCOPY WITH PROPOFOL;  Surgeon: Kerin Salen, MD;  Location: [facility];  Service: Gastroenterology;  Laterality: N/A;   ESOPHAGOGASTRODUODENOSCOPY (EGD) WITH PROPOFOL N/A 10/09/2019   Procedure: ESOPHAGOGASTRODUODENOSCOPY (EGD) WITH PROPOFOL;  Surgeon: Kerin Salen, MD;   Location: [facility];  Service: Gastroenterology;  Laterality: N/A;   POLYPECTOMY  10/09/2019   Procedure: POLYPECTOMY;  Surgeon: Kerin Salen, MD;  Location: [facility];  Service: Gastroenterology;;   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    SOCIAL HISTORY: Social History   Socioeconomic History   Marital status: Married    Spouse name: Not on file   Number of children: 2   Years of education: Not on file   Highest education level: Not on file  Occupational History   Occupation: Retired-mechanic  Tobacco Use   Smoking status: Former    Current packs/day: 0.00    Average packs/day: 1 pack/day for 31.0 years (31.0 ttl pk-yrs)    Types: Cigarettes    Start date: 04/13/1967    Quit date: 04/12/1998    Years since quitting: 25.1    Passive exposure: Never   Smokeless tobacco: Never  Vaping Use   Vaping status: Never Used  Substance and Sexual Activity   Alcohol use: No   Drug use: No   Sexual activity: Not on file  Other Topics Concern  Not on file  Social History Narrative   Not on file   Social Drivers of Health   Financial Resource Strain: Not on file  Food Insecurity: Not on file  Transportation Needs: Not on file  Physical Activity: Not on file  Stress: Not on file  Social Connections: Not on file  Intimate Partner Violence: Not on file    FAMILY HISTORY: Family History  Problem Relation Age of Onset   Hypertension Mother    Diabetes Mother    Multiple myeloma Mother    Hypertension Father    Diabetes Father    Healthy Son    Healthy Daughter     ALLERGIES:  is allergic to atorvastatin, simvastatin, other, and statins.  MEDICATIONS:  Current Outpatient Medications  Medication Sig Dispense Refill   aspirin EC 81 MG tablet Take 81 mg by mouth daily. Swallow whole.     Calcium Carbonate (CALCIUM 600 PO) Take 600 mg by mouth in the morning and at bedtime.     ferrous sulfate 325 (65 FE) MG EC tablet Take 325 mg by mouth daily at 6 (six) AM.     finasteride  (PROSCAR) 5 MG tablet Take 5 mg by mouth daily.     furosemide (LASIX) 40 MG tablet TAKE 1 TABLET BY MOUTH DAILY 90 tablet 3   lenalidomide (REVLIMID) 10 MG capsule Take 1 capsule by mouth once daily for 21 days, then 7 days off. 21 capsule 0   Multiple Vitamin (MULTIVITAMIN ADULT PO) Take by mouth.     tamsulosin (FLOMAX) 0.4 MG CAPS capsule Take 0.4 mg by mouth at bedtime.     Turmeric 500 MG CAPS Take by mouth daily at 6 (six) AM.     Vitamin D-Vitamin K (K2 PLUS D3 PO) Take 500 tablets by mouth daily at 6 (six) AM.     No current facility-administered medications for this visit.   Facility-Administered Medications Ordered in Other Visits  Medication Dose Route Frequency Provider Last Rate Last Admin   acetaminophen (TYLENOL) 325 MG tablet            dexamethasone (DECADRON) 4 MG tablet            diphenhydrAMINE (BENADRYL) 25 mg capsule              PHYSICAL EXAMINATION: ECOG PERFORMANCE STATUS: 1 - Symptomatic but completely ambulatory Vitals:   05/15/23 1320  BP: 129/85  Pulse: 79  Resp: 18  Temp: 97.9 F (36.6 C)   Filed Weights   05/15/23 1320  Weight: 285 lb 3.2 oz (129.4 kg)    Physical Exam Constitutional:      General: He is not in acute distress.    Appearance: He is obese.     Comments: Patient walks with a cane.  HENT:     Head: Normocephalic and atraumatic.  Eyes:     General: No scleral icterus. Cardiovascular:     Rate and Rhythm: Normal rate and regular rhythm.     Heart sounds: Normal heart sounds.  Pulmonary:     Effort: Pulmonary effort is normal. No respiratory distress.     Breath sounds: No wheezing.  Abdominal:     General: Bowel sounds are normal. There is no distension.     Palpations: Abdomen is soft.  Musculoskeletal:        General: No deformity. Normal range of motion.     Cervical back: Normal range of motion and neck supple.     Comments: Trace edema,  bilateral low extremities.   Skin:    General: Skin is warm and dry.      Findings: No erythema or rash.  Neurological:     Mental Status: He is alert and oriented to person, place, and time. Mental status is at baseline.     Cranial Nerves: No cranial nerve deficit.     Coordination: Coordination normal.  Psychiatric:        Mood and Affect: Mood normal.     LABORATORY DATA:  I have reviewed the data as listed    Latest Ref Rng & Units 05/15/2023   12:38 PM 04/10/2023   10:43 AM 03/13/2023   10:32 AM  CBC  WBC 4.0 - 10.5 K/uL 6.2  5.5  4.9   Hemoglobin 13.0 - 17.0 g/dL 84.1  32.4  40.1   Hematocrit 39.0 - 52.0 % 38.4  37.7  37.9   Platelets 150 - 400 K/uL 247  238  230       Latest Ref Rng & Units 05/15/2023   12:38 PM 04/10/2023   10:43 AM 03/13/2023   10:32 AM  CMP  Glucose 70 - 99 mg/dL 027  253  664   BUN 8 - 23 mg/dL 17  17  12    Creatinine 0.61 - 1.24 mg/dL 4.03  4.74  2.59   Sodium 135 - 145 mmol/L 137  136  139   Potassium 3.5 - 5.1 mmol/L 4.1  3.8  3.8   Chloride 98 - 111 mmol/L 103  103  108   CO2 22 - 32 mmol/L 26  25  23    Calcium 8.9 - 10.3 mg/dL 9.1  9.3  8.9   Total Protein 6.5 - 8.1 g/dL 7.5  7.1  7.2   Total Bilirubin 0.0 - 1.2 mg/dL 0.4  0.5  0.5   Alkaline Phos 38 - 126 U/L 71  66  66   AST 15 - 41 U/L 16  19  16    ALT 0 - 44 U/L 7  9  7       RADIOGRAPHIC STUDIES: I have personally reviewed the radiological images as listed and agreed with the findings in the report.  CT CARDIAC SCORING (SELF PAY ONLY) Addendum Date: 04/29/2023 ADDENDUM REPORT: 04/29/2023 22:25 EXAM: OVER-READ INTERPRETATION  CT CHEST The following report is an over-read performed by radiologist Dr. Narda Rutherford of [facility] on 04/29/2023. This over-read does not include interpretation of cardiac or coronary anatomy or pathology. The coronary calcium score interpretation by the cardiologist is attached. COMPARISON:  PET CT 08/12/2021, chest CT 07/09/2019 FINDINGS: Vascular: Mild aortic atherosclerosis. The included aorta is normal in caliber.  Mediastinum/nodes: No adenopathy or mass. Unremarkable esophagus. Lungs: No focal airspace disease. 3 mm perifissural right upper lobe nodule series 4, image 10. This is unchanged from 2021 exam and considered definitively benign. No further follow-up is recommended per Fleischner society guidelines. No pleural fluid. The included airways are patent. Upper abdomen: No acute or unexpected findings. Musculoskeletal: There are no acute or suspicious osseous abnormalities. Thoracic spondylosis with anterior spurring. IMPRESSION: Aortic Atherosclerosis (ICD10-I70.0). Electronically Signed   By: Narda Rutherford M.D.   On: 04/29/2023 22:25   Result Date: 04/29/2023 CLINICAL DATA:  Cardiovascular Disease Risk stratification EXAM: Coronary Calcium Score TECHNIQUE: A gated, non-contrast computed tomography scan of the heart was performed using 3 mm slice thickness. Axial images were analyzed on a dedicated workstation. Calcium scoring of the coronary arteries was performed using the Agatston method. FINDINGS:  Coronary arteries: Normal origins. Coronary Calcium Score: Left main: 0 Left anterior descending artery: 351 Left circumflex artery: 181 Right coronary artery: 132 Total: 664 Percentile: 92nd Pericardium: Normal. Aorta: Normal caliber.  Aortic atherosclerosis. Non-cardiac: See separate report from [facility]. IMPRESSION: 1. Coronary calcium score of 664. This was 92nd percentile for age-, race-, and sex-matched controls (MESA). 2. Aortic atherosclerosis. RECOMMENDATIONS: Coronary artery calcium (CAC) score is a strong predictor of incident coronary heart disease (CHD) and provides predictive information beyond traditional risk factors. CAC scoring is reasonable to use in the decision to withhold, postpone, or initiate statin therapy in intermediate-risk or selected borderline-risk asymptomatic adults (age 28-75 years and LDL-C >=70 to <190 mg/dL) who do not have diabetes or established atherosclerotic  cardiovascular disease (ASCVD).* In intermediate-risk (10-year ASCVD risk >=7.5% to <20%) adults or selected borderline-risk (10-year ASCVD risk >=5% to <7.5%) adults in whom a CAC score is measured for the purpose of making a treatment decision the following recommendations have been made: If CAC=0, it is reasonable to withhold statin therapy and reassess in 5 to 10 years, as long as higher risk conditions are absent (diabetes mellitus, family history of premature CHD in first degree relatives (males <55 years; females <65 years), cigarette smoking, or LDL >=190 mg/dL). If CAC is 1 to 99, it is reasonable to initiate statin therapy for patients >=71 years of age. If CAC is >=100 or >=75th percentile, it is reasonable to initiate statin therapy at any age. Cardiology referral should be considered for patients with CAC scores >=400 or >=75th percentile. *2018 AHA/ACC/AACVPR/AAPA/ABC/ACPM/ADA/AGS/APhA/ASPC/NLA/PCNA Guideline on the Management of Blood Cholesterol: A Report of the American College of Cardiology/American Heart Association Task Force on Clinical Practice Guidelines. J Am Coll Cardiol. 2019;73(24):3168-3209. Zoila Shutter, MD Electronically Signed: By: Chrystie Nose M.D. On: 04/23/2023 16:51

## 2023-05-15 NOTE — Assessment & Plan Note (Signed)
secondary to carpal tunnel disease and chemotherapy Patient did not tolerate gabapentin.  Monitor symptoms-stable

## 2023-05-15 NOTE — Assessment & Plan Note (Addendum)
Encourage oral hydration. Improved after he tried dietary changes. Recommend Imodium as needed per instruction Patient is reluctant to take. Symptoms are stable to improved.

## 2023-05-15 NOTE — Assessment & Plan Note (Signed)
Currently he is off Humira and methotrexate.  He has pain in bilateral upper extremities follow up with rheumatology.

## 2023-05-15 NOTE — Assessment & Plan Note (Signed)
Chemotherapy plan as listed above

## 2023-05-15 NOTE — Assessment & Plan Note (Addendum)
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 Stable counts. [facility] myeloma/bone marrow transplant notes were reviewed. 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  vaccinations per protocol per  [facility] are reviewed and discussed with patient. Light chain ratio gradually increases.  maintenance with revlimid 10 mg on days 1-21 of 28 day cycle - proceed with this cycle Revlimid -  Zometa  - plan Q3 months to continue to 2 years of therapy [until Nov 2025].  Patient will receive today, next due May 2025 Recommend calcium 1200mg  daily with vitamin D supplementation.

## 2023-05-16 LAB — KAPPA/LAMBDA LIGHT CHAINS
Kappa free light chain: 26.9 mg/L — ABNORMAL HIGH (ref 3.3–19.4)
Kappa, lambda light chain ratio: 1.69 — ABNORMAL HIGH (ref 0.26–1.65)
Lambda free light chains: 15.9 mg/L (ref 5.7–26.3)

## 2023-05-17 ENCOUNTER — Telehealth: Payer: Self-pay | Admitting: Pharmacy Technician

## 2023-05-17 ENCOUNTER — Ambulatory Visit: Payer: Medicare Other | Attending: Cardiology | Admitting: Pharmacist

## 2023-05-17 ENCOUNTER — Other Ambulatory Visit (HOSPITAL_COMMUNITY): Payer: Self-pay

## 2023-05-17 ENCOUNTER — Telehealth: Payer: Self-pay | Admitting: Pharmacist

## 2023-05-17 DIAGNOSIS — I2510 Atherosclerotic heart disease of native coronary artery without angina pectoris: Secondary | ICD-10-CM | POA: Diagnosis not present

## 2023-05-17 NOTE — Assessment & Plan Note (Signed)
Assessment: LDL-C of 129 is above goal of less than 70 Patient intolerant to 2 different statins Reviewed PCSK9 with patient Reviewed heart healthy diet including limiting saturated fat-handouts provided Plan: Patient agreeable to trying [id number] We will submit PA for Repatha Patient with dual complete cost should not be an issue

## 2023-05-17 NOTE — Telephone Encounter (Signed)
Pharmacy Patient Advocate Encounter   Received notification from Physician's Office that prior authorization for repatha is required/requested.   Insurance verification completed.   The patient is insured through [facility] .   Per test claim: PA required; PA submitted to above mentioned insurance via CoverMyMeds Key/confirmation #/EOC [id number] Status is pending

## 2023-05-17 NOTE — Telephone Encounter (Signed)
Pharmacy Patient Advocate Encounter  Received notification from [facility] that Prior Authorization for Repatha has been APPROVED from 05/17/23 to 11/14/23. Ran test claim, Copay is $0.00- one month. This test claim was processed through [facility]- copay amounts may vary at other pharmacies due to pharmacy/plan contracts, or as the patient moves through the different stages of their insurance plan.   PA #/Case ID/Reference #: [id number]

## 2023-05-17 NOTE — Patient Instructions (Signed)

## 2023-05-17 NOTE — Telephone Encounter (Signed)
-----   Message from Olene Floss sent at 05/17/2023  1:24 PM EST ----- Please complete prior authorization for Repatha

## 2023-05-17 NOTE — Progress Notes (Signed)
Patient ID: Eric Cruz                 DOB: [birth year]                    MRN: [id number]      HPI: Eric Cruz is a 73 y.o. male patient referred to lipid clinic by Robin Searing, NP. PMH is significant for diastolic CHF, GERD, HLD, HTN, rheumatoid arthritis, prostate CA, multiple myeloma.  Coronary calcium score of 664, 92nd percentile in January 2025.  Patient reported being intolerant to statins and was referred to lipid clinic.  Patient presents today to lipid clinic accompanied by his wife.  Patient was under the impression that he was also meeting with Dr. to review his CT.  Robin Searing, NP came down to review patient's calcium score with him.  Patient had not viewed it in my chart, he does not know how to access.  I also reviewed what patient's coronary calcium score means and why we want to be aggressive with his cholesterol to prevent future progression of disease.  PCSK9 mechanism of action, injection technique and side effects were reviewed with patient.  Patient intolerant to atorvastatin and simvastatin.  Caused cramps and pain in both his arms.   Current Medications: none Intolerances: atorvastatin, simvastatin (arm pain) Risk Factors: Elevated coronary calcium score, age, hypertension LDL-C goal: Less than 70 ApoB goal:   Diet: water, coffee, green tea, lemonade   Exercise: Very limited.  Patient does not sleep well at night and does not feel as though he has any energy to do much.  Family History:  Family History  Problem Relation Age of Onset   Hypertension Mother    Diabetes Mother    Multiple myeloma Mother    Hypertension Father    Diabetes Father    Healthy Son    Healthy Daughter      Social History: quit 25 years ago, no ETOH  Labs: Lipid Panel     Component Value Date/Time   CHOL 225 (H) 10/19/2022 0926   TRIG 150 (H) 10/19/2022 0926   HDL 66 10/19/2022 0926   CHOLHDL 3.4 10/19/2022 0926   VLDL 30 10/19/2022 0926   LDLCALC 129 (H)  10/19/2022 0926    Past Medical History:  Diagnosis Date   Arthritis    BPH (benign prostatic hyperplasia)    Cancer (HCC)    Prostate: states he had a positive biopsy but had another one a year later and it was gone.    Depression    Dizziness    GERD (gastroesophageal reflux disease)    Gout    High cholesterol    Hypertension    Sleep apnea    has cpap - not currently wearing   Varicose veins     Current Outpatient Medications on File Prior to Visit  Medication Sig Dispense Refill   aspirin EC 81 MG tablet Take 81 mg by mouth daily. Swallow whole.     Calcium Carbonate (CALCIUM 600 PO) Take 600 mg by mouth in the morning and at bedtime.     ferrous sulfate 325 (65 FE) MG EC tablet Take 325 mg by mouth daily at 6 (six) AM.     finasteride (PROSCAR) 5 MG tablet Take 5 mg by mouth daily.     furosemide (LASIX) 40 MG tablet TAKE 1 TABLET BY MOUTH DAILY 90 tablet 3   lenalidomide (REVLIMID) 10 MG capsule Take 1 capsule by mouth once  daily for 21 days, then 7 days off. 21 capsule 0   Multiple Vitamin (MULTIVITAMIN ADULT PO) Take by mouth.     tamsulosin (FLOMAX) 0.4 MG CAPS capsule Take 0.4 mg by mouth at bedtime.     Turmeric 500 MG CAPS Take by mouth daily at 6 (six) AM.     Vitamin D-Vitamin K (K2 PLUS D3 PO) Take 500 tablets by mouth daily at 6 (six) AM.     Current Facility-Administered Medications on File Prior to Visit  Medication Dose Route Frequency Provider Last Rate Last Admin   acetaminophen (TYLENOL) 325 MG tablet            dexamethasone (DECADRON) 4 MG tablet            diphenhydrAMINE (BENADRYL) 25 mg capsule             Allergies  Allergen Reactions   Atorvastatin     Other reaction(s): Unknown   Simvastatin     Arms ache Other reaction(s): Unknown   Other     Can only tolerate oxygen on a low level   Statins     Arms ache    Assessment/Plan:  1. Hyperlipidemia -  Coronary artery calcification Assessment: LDL-C of 129 is above goal of less than  70 Patient intolerant to 2 different statins Reviewed PCSK9 with patient Reviewed heart healthy diet including limiting saturated fat-handouts provided Plan: Patient agreeable to trying PCSK9i We will submit PA for Repatha Patient with dual complete cost should not be an issue    Thank you,  Olene Floss, Pharm.D, BCACP, CPP [facility] A [facility] of [facility] [address], [place], [place] [address]  Phone: [phone]; Fax: [phone]

## 2023-05-17 NOTE — Telephone Encounter (Signed)
Patient called back after appointment stating that he doesn't know if he wants to take any more medication. He doesn't like that he would have to take for the rest of his life and he doesn't believe that it doesn't interfere with his other medications.  He wants to know if there is another test that can show if he has any blockage or soft plaque. I advised that this would not change our recommendations as we know that he has a decent amount of plaque in his arteries and we would recommend aggressive treatment of his cholesterol regardless.  The goal is to prevent the plaque from limiting blood flow therefore even if it was not limiting blood flow we would still recommend treating his cholesterol.  Also unsure if his insurance will cover.  Patient insistent that he does not want to take the medication and wants to see if his insurance will cover a CTA.

## 2023-05-18 LAB — MULTIPLE MYELOMA PANEL, SERUM
Albumin SerPl Elph-Mcnc: 3.7 g/dL (ref 2.9–4.4)
Albumin/Glob SerPl: 1.2 (ref 0.7–1.7)
Alpha 1: 0.2 g/dL (ref 0.0–0.4)
Alpha2 Glob SerPl Elph-Mcnc: 0.7 g/dL (ref 0.4–1.0)
B-Globulin SerPl Elph-Mcnc: 1.1 g/dL (ref 0.7–1.3)
Gamma Glob SerPl Elph-Mcnc: 1.2 g/dL (ref 0.4–1.8)
Globulin, Total: 3.3 g/dL (ref 2.2–3.9)
IgA: 216 mg/dL (ref 61–437)
IgG (Immunoglobin G), Serum: 1292 mg/dL (ref 603–1613)
IgM (Immunoglobulin M), Srm: 45 mg/dL (ref 20–172)
Total Protein ELP: 7 g/dL (ref 6.0–8.5)

## 2023-05-22 NOTE — Telephone Encounter (Signed)
 Called pt to schedule OV scheduled for 06/11/23 at 2:45 pm.  Pt request an end of the day appointment as he knows it will be a long visit.  Pt reports it will take a lot for provider to convince him to take any medication.  Advised pt if he would like to be at the end of the day will have to wait until May.  Reports he does not want to wait this long.  Will keep scheduled OV.   Pt would like copies of pictures from Calcium score test and explanation of what it showed.

## 2023-05-24 NOTE — Telephone Encounter (Signed)
 Spoke with pt. Let him know that Robin Searing, NP would be speaking to him further regarding his test results during his upcoming appointment. Pt verbalized understanding and had no further questions.

## 2023-05-30 ENCOUNTER — Other Ambulatory Visit: Payer: Self-pay | Admitting: Oncology

## 2023-05-30 DIAGNOSIS — C9000 Multiple myeloma not having achieved remission: Secondary | ICD-10-CM

## 2023-06-10 NOTE — Progress Notes (Unsigned)
 Cardiology Office Note    Patient Name: Eric Cruz Date of Encounter: 06/10/2023  Primary Care Provider:  Renford Dills, MD Primary Cardiologist:  Verne Carrow, MD Primary Electrophysiologist: None   Past Medical History    Past Medical History:  Diagnosis Date   Arthritis    BPH (benign prostatic hyperplasia)    Cancer [facility])    Prostate: states he had a positive biopsy but had another one a year later and it was gone.    Depression    Dizziness    GERD (gastroesophageal reflux disease)    Gout    High cholesterol    Hypertension    Sleep apnea    has cpap - not currently wearing   Varicose veins     History of Present Illness  Eric Cruz is a 73 year old male with a PMH of chronic diastolic CHF, tobacco abuse, GERD, HLD, HTN, rheumatoid arthritis, prostate CA, multiple myeloma who presents today for follow-up of coronary calcium score and to discuss treatment options.  Eric Cruz was last seen on 04/09/2023 for 71-month follow-up.  During his visit his blood pressure was initially elevated but improved before leaving the office.  He was primarily following diet modifications for managing his cholesterol and LDL was above goal at 129.  He completed a cardiac calcium score that showed increased calcium score 664 with aortic atherosclerosis indicated and patient was referred to Pharm.D. to discuss PCSK9 inhibitors.  He has a history of statin intolerance and was agreeable to proceed with PCSK9 however contacted our office and reported some reluctance to begin further medication.  He is interested in pursuing further evaluation of his coronaries before committing to starting medication.  Eric Cruz presents today to discuss his coronary calcium score and options for lowering his LDL cholesterol.  He reports doing well since his previous visit and blood pressures are stable at 126/70.  We discussed in detail the pathophysiology of coronary artery disease along with  modifiable and nonmodifiable risk factors.  He reported some reluctance with beginning medications however after shared decision patient is willing to proceed with initiation of PCSK9 inhibitors.  He was previously approved for preauthorization and will begin medication today. Patient denies chest pain, palpitations, dyspnea, PND, orthopnea, nausea, vomiting, dizziness, syncope, edema, weight gain, or early satiety.   Review of Systems  Please see the history of present illness.    All other systems reviewed and are otherwise negative except as noted above.  Physical Exam    Wt Readings from Last 3 Encounters:  05/15/23 285 lb 3.2 oz (129.4 kg)  04/10/23 287 lb 11.2 oz (130.5 kg)  04/09/23 288 lb (130.6 kg)   MV:HQION were no vitals filed for this visit.,There is no height or weight on file to calculate BMI. GEN: Well nourished, well developed in no acute distress Neck: No JVD; No carotid bruits Pulmonary: Clear to auscultation without rales, wheezing or rhonchi  Cardiovascular: Normal rate. Regular rhythm. Normal S1. Normal S2.   Murmurs: There is no murmur.  ABDOMEN: Soft, non-tender, non-distended EXTREMITIES:  No edema; No deformity   EKG/LABS/ Recent Cardiac Studies   ECG personally reviewed by me today -none completed today  Risk Assessment/Calculations:          Lab Results  Component Value Date   WBC 6.2 05/15/2023   HGB 12.5 (L) 05/15/2023   HCT 38.4 (L) 05/15/2023   MCV 96.7 05/15/2023   PLT 247 05/15/2023   Lab Results  Component  Value Date   CREATININE 1.04 05/15/2023   BUN 17 05/15/2023   NA 137 05/15/2023   K 4.1 05/15/2023   CL 103 05/15/2023   CO2 26 05/15/2023   Lab Results  Component Value Date   CHOL 225 (H) 10/19/2022   HDL 66 10/19/2022   LDLCALC 129 (H) 10/19/2022   TRIG 150 (H) 10/19/2022   CHOLHDL 3.4 10/19/2022    No results found for: "HGBA1C" Assessment & Plan    1.  Coronary calcifications: -Coronary calcium score completed  showing elevated score of 664  -Through shared decision patient is willing to begin PCSK9 inhibitor therapy. -Patient will start Repatha 140 mg q. 14 days -We will check LFT and lipids in 8 weeks  2.Chronic diastolic CHF: -Patient's last 2D echo was completed in 2023 with mild MR -Today patient is euvolemic on examination with trace lower extremity edema present -Continue Lasix 40 mg daily  3.  Aortic atherosclerosis: -Patient's coronary calcium score 664 -Continue GDMT with ASA 81 mg and Repatha as noted above  4.  Hyperlipidemia: -Patient's last LDL cholesterol was elevated at 129 -Patient will begin Repatha 140 mg q. 14 days -We will recheck LFTs and lipids in 8 weeks  5.  Essential hypertension: -Patient's blood pressure today was stable at 126/70 -Continue low-sodium heart healthy diet   Disposition: Follow-up with Verne Carrow, MD or APP in 4 months    Signed, Napoleon Form, Leodis Rains, NP 06/10/2023, 3:21 PM  [facility]

## 2023-06-11 ENCOUNTER — Ambulatory Visit: Payer: Medicare Other | Attending: Nurse Practitioner | Admitting: Nurse Practitioner

## 2023-06-11 ENCOUNTER — Telehealth: Payer: Self-pay | Admitting: Cardiovascular Disease

## 2023-06-11 ENCOUNTER — Other Ambulatory Visit: Payer: Self-pay

## 2023-06-11 ENCOUNTER — Encounter: Payer: Self-pay | Admitting: Nurse Practitioner

## 2023-06-11 VITALS — BP 126/70 | HR 71 | Ht 68.0 in | Wt 285.0 lb

## 2023-06-11 DIAGNOSIS — I10 Essential (primary) hypertension: Secondary | ICD-10-CM | POA: Diagnosis not present

## 2023-06-11 DIAGNOSIS — E785 Hyperlipidemia, unspecified: Secondary | ICD-10-CM

## 2023-06-11 DIAGNOSIS — I2510 Atherosclerotic heart disease of native coronary artery without angina pectoris: Secondary | ICD-10-CM | POA: Diagnosis not present

## 2023-06-11 DIAGNOSIS — I5032 Chronic diastolic (congestive) heart failure: Secondary | ICD-10-CM

## 2023-06-11 DIAGNOSIS — Z87898 Personal history of other specified conditions: Secondary | ICD-10-CM

## 2023-06-11 DIAGNOSIS — C9000 Multiple myeloma not having achieved remission: Secondary | ICD-10-CM

## 2023-06-11 MED ORDER — REPATHA SURECLICK 140 MG/ML ~~LOC~~ SOAJ: 140.0000 mg | SUBCUTANEOUS | 10 refills | Status: AC

## 2023-06-11 NOTE — Telephone Encounter (Signed)
 Patient needs a 4 month follow up to see Verne Carrow, MD or Robin Searing, NP in the month of July 2025.  Thank you.

## 2023-06-11 NOTE — Patient Instructions (Addendum)
 Medication Instructions:  START Repatha Take 1 injection every 14 days *If you need a refill on your cardiac medications before your next appointment, please call your pharmacy*   Lab Work: 8 weeks FASTING LABS LIPIDS & LFTs  (WEEK OF MAY 5TH 2025) If you have labs (blood work) drawn today and your tests are completely normal, you will receive your results only by: MyChart Message (if you have MyChart) OR A paper copy in the mail If you have any lab test that is abnormal or we need to change your treatment, we will call you to review the results.   Testing/Procedures: NONE ORDERED   Follow-Up: At [facility], you and your health needs are our priority.  As part of our continuing mission to provide you with exceptional heart care, we have created designated Provider Care Teams.  These Care Teams include your primary Cardiologist (physician) and Advanced Practice Providers (APPs -  Physician Assistants and Nurse Practitioners) who all work together to provide you with the care you need, when you need it.  We recommend signing up for the patient portal called "MyChart".  Sign up information is provided on this After Visit Summary.  MyChart is used to connect with patients for Virtual Visits (Telemedicine).  Patients are able to view lab/test results, encounter notes, upcoming appointments, etc.  Non-urgent messages can be sent to your provider as well.   To learn more about what you can do with MyChart, go to [email or web address].    Your next appointment:   4 month(s)  Provider:   Verne Carrow, MD  or Robin Searing, NP   Other Instructions

## 2023-06-12 ENCOUNTER — Inpatient Hospital Stay: Payer: Medicare Other | Attending: Oncology

## 2023-06-12 ENCOUNTER — Inpatient Hospital Stay: Payer: Medicare Other | Admitting: Oncology

## 2023-06-12 ENCOUNTER — Encounter: Payer: Self-pay | Admitting: Oncology

## 2023-06-12 VITALS — BP 129/82 | HR 72 | Temp 99.1°F | Resp 18 | Wt 287.3 lb

## 2023-06-12 DIAGNOSIS — E876 Hypokalemia: Secondary | ICD-10-CM | POA: Diagnosis not present

## 2023-06-12 DIAGNOSIS — Z79624 Long term (current) use of inhibitors of nucleotide synthesis: Secondary | ICD-10-CM | POA: Insufficient documentation

## 2023-06-12 DIAGNOSIS — Z9484 Stem cells transplant status: Secondary | ICD-10-CM | POA: Diagnosis not present

## 2023-06-12 DIAGNOSIS — Z87891 Personal history of nicotine dependence: Secondary | ICD-10-CM | POA: Insufficient documentation

## 2023-06-12 DIAGNOSIS — Z7982 Long term (current) use of aspirin: Secondary | ICD-10-CM | POA: Diagnosis not present

## 2023-06-12 DIAGNOSIS — Z79899 Other long term (current) drug therapy: Secondary | ICD-10-CM | POA: Diagnosis not present

## 2023-06-12 DIAGNOSIS — Z5111 Encounter for antineoplastic chemotherapy: Secondary | ICD-10-CM | POA: Diagnosis not present

## 2023-06-12 DIAGNOSIS — R197 Diarrhea, unspecified: Secondary | ICD-10-CM | POA: Insufficient documentation

## 2023-06-12 DIAGNOSIS — Z7961 Long term (current) use of immunomodulator: Secondary | ICD-10-CM | POA: Diagnosis not present

## 2023-06-12 DIAGNOSIS — T451X5A Adverse effect of antineoplastic and immunosuppressive drugs, initial encounter: Secondary | ICD-10-CM

## 2023-06-12 DIAGNOSIS — C9000 Multiple myeloma not having achieved remission: Secondary | ICD-10-CM

## 2023-06-12 DIAGNOSIS — Z9481 Bone marrow transplant status: Secondary | ICD-10-CM | POA: Insufficient documentation

## 2023-06-12 DIAGNOSIS — G629 Polyneuropathy, unspecified: Secondary | ICD-10-CM | POA: Diagnosis not present

## 2023-06-12 DIAGNOSIS — K521 Toxic gastroenteritis and colitis: Secondary | ICD-10-CM | POA: Diagnosis not present

## 2023-06-12 DIAGNOSIS — G620 Drug-induced polyneuropathy: Secondary | ICD-10-CM | POA: Insufficient documentation

## 2023-06-12 LAB — CBC WITH DIFFERENTIAL (CANCER CENTER ONLY)
Abs Immature Granulocytes: 0.01 10*3/uL (ref 0.00–0.07)
Basophils Absolute: 0 10*3/uL (ref 0.0–0.1)
Basophils Relative: 1 %
Eosinophils Absolute: 0 10*3/uL (ref 0.0–0.5)
Eosinophils Relative: 1 %
HCT: 36.6 % — ABNORMAL LOW (ref 39.0–52.0)
Hemoglobin: 12.1 g/dL — ABNORMAL LOW (ref 13.0–17.0)
Immature Granulocytes: 0 %
Lymphocytes Relative: 23 %
Lymphs Abs: 0.9 10*3/uL (ref 0.7–4.0)
MCH: 31.5 pg (ref 26.0–34.0)
MCHC: 33.1 g/dL (ref 30.0–36.0)
MCV: 95.3 fL (ref 80.0–100.0)
Monocytes Absolute: 0.9 10*3/uL (ref 0.1–1.0)
Monocytes Relative: 23 %
Neutro Abs: 2 10*3/uL (ref 1.7–7.7)
Neutrophils Relative %: 52 %
Platelet Count: 192 10*3/uL (ref 150–400)
RBC: 3.84 MIL/uL — ABNORMAL LOW (ref 4.22–5.81)
RDW: 14.9 % (ref 11.5–15.5)
WBC Count: 3.8 10*3/uL — ABNORMAL LOW (ref 4.0–10.5)
nRBC: 0 % (ref 0.0–0.2)

## 2023-06-12 LAB — CMP (CANCER CENTER ONLY)
ALT: 8 U/L (ref 0–44)
AST: 18 U/L (ref 15–41)
Albumin: 3.6 g/dL (ref 3.5–5.0)
Alkaline Phosphatase: 60 U/L (ref 38–126)
Anion gap: 6 (ref 5–15)
BUN: 10 mg/dL (ref 8–23)
CO2: 24 mmol/L (ref 22–32)
Calcium: 8.5 mg/dL — ABNORMAL LOW (ref 8.9–10.3)
Chloride: 105 mmol/L (ref 98–111)
Creatinine: 1.16 mg/dL (ref 0.61–1.24)
GFR, Estimated: >60 mL/min (ref >60)
Glucose, Bld: 108 mg/dL — ABNORMAL HIGH (ref 70–99)
Potassium: 3.4 mmol/L — ABNORMAL LOW (ref 3.5–5.1)
Sodium: 135 mmol/L (ref 135–145)
Total Bilirubin: 0.3 mg/dL (ref 0.0–1.2)
Total Protein: 7.1 g/dL (ref 6.5–8.1)

## 2023-06-12 MED ORDER — CALCIUM CARBONATE 600 MG PO TABS: 600.0000 mg | ORAL_TABLET | Freq: Three times a day (TID) | ORAL | Status: AC

## 2023-06-12 NOTE — Assessment & Plan Note (Addendum)
 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 myeloma/bone marrow transplant notes were reviewed. 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  vaccinations per protocol per  [facility] are reviewed and discussed with patient. Light chain ratio gradually increases.  maintenance with revlimid 10 mg on days 1-21 of 28 day cycle - proceed with this cycle Revlimid -  Zometa  - plan Q3 months to continue to 2 years of therapy [until Nov 2025], next due May 2025 Recommend calcium with vitamin D supplementation.

## 2023-06-12 NOTE — Assessment & Plan Note (Signed)
 secondary to carpal tunnel disease and chemotherapy Patient did not tolerate gabapentin.  Monitor symptoms-stable

## 2023-06-12 NOTE — Assessment & Plan Note (Signed)
 Chemotherapy plan as listed above

## 2023-06-12 NOTE — Assessment & Plan Note (Signed)
 Likely due to recent Zometa.  Recommend increase calcium to 600mg  TID

## 2023-06-12 NOTE — Assessment & Plan Note (Signed)
 Recommend potassium rich food intake.

## 2023-06-12 NOTE — Progress Notes (Signed)
 [facility] Hematology/Oncology Progress note Telephone:([phone]) [phone] Fax:([phone]      CHIEF COMPLAINTS/REASON FOR VISIT:  Follow-up for multiple myeloma   ASSESSMENT & PLAN:   Cancer Staging  Multiple myeloma not having achieved remission (HCC) Staging form: Plasma Cell Myeloma and Plasma Cell Disorders, AJCC 8th Edition - Clinical stage from 04/22/2020: RISS Stage II (Beta-2-microglobulin (mg/L): 2.7, Albumin (g/dL): 3.1, ISS: Stage II, High-risk cytogenetics: Absent, LDH: Normal) - Signed by Rickard Patience, MD on 08/10/2021   Multiple myeloma not having achieved remission (HCC) 0IgA lambda multiple myeloma, M protein 2.3, normal free light chain ratio Status post Dara Rvd, achieved CR, 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  vaccinations per protocol per  [facility] are reviewed and discussed with patient. Light chain ratio gradually increases.  maintenance with revlimid 10 mg on days 1-21 of 28 day cycle - proceed with this cycle Revlimid -  Zometa  - plan Q3 months to continue to 2 years of therapy [until Nov 2025], next due May 2025 Recommend calcium with vitamin D supplementation.   Chemotherapy induced diarrhea Intermittent symptoms.  Encourage oral hydration. Improved after he tried dietary changes. Recommend Imodium as needed per instruction Patient is reluctant to take.    Encounter for antineoplastic chemotherapy Chemotherapy plan as listed above.  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  Monitor  symptoms-stable   Hypokalemia Recommend potassium rich food intake  Hypocalcemia Likely due to recent Zometa.  Recommend increase calcium to 600mg  TID      Orders Placed This Encounter  Procedures   CMP (Cancer Center only)    Standing Status:   Future    Expected Date:   07/10/2023    Expiration Date:   06/11/2024   CBC with Differential (Cancer Center Only)    Standing Status:   Future    Expected Date:   07/10/2023    Expiration Date:   06/11/2024   Multiple Myeloma Panel (SPEP&IFE w/QIG)    Standing Status:   Future    Expected Date:   07/10/2023    Expiration Date:   06/11/2024   Kappa/lambda light chains    Standing Status:   Future    Expected Date:   07/10/2023    Expiration Date:   06/11/2024    Follow up in 4 weeks.  All questions were answered. The patient knows to call the clinic with any problems, questions or concerns.  Rickard Patience, MD, PhD [facility] Hematology Oncology 06/12/2023    HISTORY OF PRESENTING ILLNESS:  Eric Cruz is a 73 y.o. male presents for follow up of myeloma .  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  27% plasma cell, cytogenetics - normal, and myeloma FISH panel is negative.  Standard risk.  Congo red staining was added and was negative.  - 07/27/2021, bone marrow biopsy showed hypercellular marrow involved by plasma cell myeloma, CD138 immunohistochemistry plasma cells compromise approximately 70% of the cellular elements and  are lambda restricted by light chain in situ hybridization.  Cytogenetics showed duplication of 1q.  Cytogenetics is normal.   04/22/2020 Cancer Staging   Staging form: Plasma Cell Myeloma and Plasma Cell Disorders, AJCC 8th Edition - Clinical stage from 04/22/2020: RISS Stage II (Beta-2-microglobulin (mg/L): 2.7, Albumin  (g/dL): 3.1, ISS: Stage II, High-risk cytogenetics: Absent, LDH: Normal) - Signed by Rickard Patience, MD on 08/10/2021 Stage prefix: Initial diagnosis Beta 2 microglobulin range (mg/L): Less than 3.5 Albumin range (g/dL): Less than 3.5 Cytogenetics: 1q addition Lactate dehydrogenase (LDH) (U/L): 136 Serum calcium level: Normal Serum creatinine level: Normal   08/12/2021 Imaging   PET scan showed 1. No evidence of active myeloma on skull base to thigh FDG PET scan. 2. No soft tissue plasmacytoma.3. No lytic or suspicious lesion on CT portion exam.     08/22/2021 - 01/11/2022 Chemotherapy   Patient is on Treatment Plan : MYELOMA NEWLY DIAGNOSED TRANSPLANT CANDIDATE DaraVRd (Daratumumab SQ) q21d x 6 Cycles (Induction/Consolidation)     08/24/2021 - 11/09/2021 Chemotherapy   Patient is on Treatment Plan : MYELOMA NEWLY DIAGNOSED TRANSPLANT CANDIDATE DaraVRd (Daratumumab SQ) q21d x 6 Cycles (Induction/Consolidation)     11/02/2021 Imaging   Bilateral lower extremity US negative for DVT   11/15/2021 Echocardiogram   1. Left ventricular ejection fraction, by estimation, is 60 to 65%. The left ventricle has normal function. The left ventricle has no regional wall motion abnormalities. Left ventricular diastolic parameters are consistent with Grade II diastolic dysfunction (pseudonormalization). The average left ventricular global longitudinal strain is -17.4 %.   2. Right ventricular systolic function is normal. The right ventricular size is normal. There is mildly elevated pulmonary artery systolic pressure. The estimated right ventricular systolic pressure is 37.7 mmHg.   3. The mitral valve is normal in structure. Mild mitral valve regurgitation. No evidence of mitral stenosis.  4. The aortic valve was not well visualized. Aortic valve regurgitation  is not visualized. No aortic stenosis is present.   5. The inferior vena cava is normal in size with greater than 50% respiratory variability, suggesting right  atrial pressure of 3 mmHg.    01/23/2022 Bone Marrow Biopsy   Mildly hypercellular bone marrow (40%) with increased erythropoiesis and 2% plasma cells.    02/15/2022 Procedure   Status post autologous stem cell transplant at [facility] Preparative regimen with Melphalan 200 mg/m2 on 02/14/22 Infusion of stem cells - 6.7 x 10(6) on 02/15/22    06/13/2022 Procedure   Bone marrow biopsy at [facility] showed: Mildly hypercellular bone marrow (40%) with trilineage hematopoiesis and no increase in plasma cells. MRD pending    06/30/2022 -  Chemotherapy   Started on maintenance Revlimid 10mg  D1-21 Q28 days.     For rheumatoid arthritis, patient was seen by Dr. Corliss Skains recently and is currently off methotrexate and Humira.  + Bilateral lower extremity swelling.[place] negative for DVT, rade II diastolic dysfunction one Echo  INTERVAL HISTORY NEPHTALI DOCKEN is a 73 y.o. male who has above history reviewed by me today presents for follow up visit for management of multiple myeloma He reports feeling well.  He has been eating healthy, stable weight.  He has aortic atheroscelrosis, previously did not tolerate statin treatments. There is plan to start PCSK9 inhibitor Repatha 140 mg q. 14 days    Review of Systems  Constitutional:  Negative for appetite change, chills, fatigue, fever and unexpected weight change.  HENT:   Negative for hearing loss and voice change.  Eyes:  Negative for eye problems and icterus.  Respiratory:  Negative for chest tightness, cough and shortness of breath.   Cardiovascular:  Negative for chest pain and leg swelling.  Gastrointestinal:  Negative for abdominal distention and abdominal pain.  Endocrine: Negative for hot flashes.  Genitourinary:  Negative for difficulty urinating, dysuria and frequency.   Musculoskeletal:  Positive for arthralgias and back pain. Negative for myalgias.  Skin:  Negative for itching and rash.  Neurological:  Negative  for light-headedness and numbness.  Hematological:  Negative for adenopathy. Does not bruise/bleed easily.  Psychiatric/Behavioral:  Negative for confusion.      MEDICAL HISTORY:  Past Medical History:  Diagnosis Date   Arthritis    BPH (benign prostatic hyperplasia)    Cancer (HCC)    Prostate: states he had a positive biopsy but had another one a year later and it was gone.    Depression    Dizziness    GERD (gastroesophageal reflux disease)    Gout    High cholesterol    Hypertension    Sleep apnea    has cpap - not currently wearing   Varicose veins     SURGICAL HISTORY: Past Surgical History:  Procedure Laterality Date   ANTERIOR CERVICAL DECOMP/DISCECTOMY FUSION N/A 02/21/2018   Procedure: Cervical Five-Six Cervical Six-Seven Anterior cervical decompression/discectomy/fusion;  Surgeon: Coletta Memos, MD;  Location: [facility] OR;  Service: Neurosurgery;  Laterality: N/A;  Cervical Five-Six Cervical Six-Seven Anterior cervical decompression/discectomy/fusion   BIOPSY  10/09/2019   Procedure: BIOPSY;  Surgeon: Kerin Salen, MD;  Location: [facility];  Service: Gastroenterology;;   BLADDER SURGERY     CARPAL TUNNEL RELEASE Right 02/11/2019   Procedure: Right Carpal Tunnel Wound Irrigation;  Surgeon: Coletta Memos, MD;  Location: [facility] OR;  Service: Neurosurgery;  Laterality: Right;  Right Carpal tunnel wound exploration/wash out   CARPAL TUNNEL RELEASE Bilateral    CHOLECYSTECTOMY  11/22/2012   CHOLECYSTECTOMY  11/22/2012   Procedure: LAPAROSCOPIC CHOLECYSTECTOMY;  Surgeon: Cherylynn Ridges, MD;  Location: [facility] OR;  Service: General;;   COLONOSCOPY     COLONOSCOPY WITH PROPOFOL N/A 10/09/2019   Procedure: COLONOSCOPY WITH PROPOFOL;  Surgeon: Kerin Salen, MD;  Location: [facility];  Service: Gastroenterology;  Laterality: N/A;   ESOPHAGOGASTRODUODENOSCOPY (EGD) WITH PROPOFOL N/A 10/09/2019   Procedure: ESOPHAGOGASTRODUODENOSCOPY (EGD) WITH PROPOFOL;  Surgeon: Kerin Salen, MD;   Location: [facility];  Service: Gastroenterology;  Laterality: N/A;   POLYPECTOMY  10/09/2019   Procedure: POLYPECTOMY;  Surgeon: Kerin Salen, MD;  Location: [facility];  Service: Gastroenterology;;   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    SOCIAL HISTORY: Social History   Socioeconomic History   Marital status: Married    Spouse name: Not on file   Number of children: 2   Years of education: Not on file   Highest education level: Not on file  Occupational History   Occupation: Retired-mechanic  Tobacco Use   Smoking status: Former    Current packs/day: 0.00    Average packs/day: 1 pack/day for 31.0 years (31.0 ttl pk-yrs)    Types: Cigarettes    Start date: 04/13/1967    Quit date: 04/12/1998    Years since quitting: 25.1    Passive exposure: Never   Smokeless tobacco: Never  Vaping Use   Vaping status: Never Used  Substance and Sexual Activity   Alcohol use: No   Drug use: No   Sexual activity: Not on file  Other Topics Concern   Not on file  Social History Narrative   Not on file   Social Drivers of Health   Financial Resource Strain: Not on file  Food Insecurity: Not on file  Transportation Needs: Not on file  Physical Activity: Not on file  Stress: Not on file  Social Connections: Not on file  Intimate Partner Violence: Not on file    FAMILY HISTORY: Family History  Problem Relation Age of Onset   Hypertension Mother    Diabetes Mother    Multiple myeloma Mother    Hypertension Father    Diabetes Father    Healthy Son    Healthy Daughter     ALLERGIES:  is allergic to atorvastatin, simvastatin, other, and statins.  MEDICATIONS:  Current Outpatient Medications  Medication Sig Dispense Refill   Ascorbic Acid (VITA-C PO) Take 2,000 tablets by mouth daily at 6 (six) AM.     aspirin EC 81 MG tablet Take 81 mg by mouth daily. Swallow whole.     Cyanocobalamin (B-12) 2500 MCG TABS Take by mouth daily at 6 (six) AM.     Ensure (ENSURE) Take 237 mLs by  mouth daily at 6 (six) AM.     Evolocumab (REPATHA SURECLICK) 140 MG/ML SOAJ Inject 140 mg into the skin every 14 (fourteen) days. 2 mL 10   ferrous sulfate 325 (65 FE) MG EC tablet Take 325 mg by mouth daily at 6 (six) AM.     finasteride (PROSCAR) 5 MG tablet Take 5 mg by mouth daily.     furosemide (LASIX) 40 MG tablet TAKE 1 TABLET BY MOUTH DAILY 90 tablet 3   lenalidomide (REVLIMID) 10 MG capsule Take 1 capsule by mouth once daily for 21 days, then 7 days off. 21 capsule 0   Multiple Vitamin (MULTIVITAMIN ADULT PO) Take by mouth.     tamsulosin (FLOMAX) 0.4 MG CAPS capsule Take 0.4 mg by mouth at bedtime.     Turmeric 500 MG CAPS Take by mouth daily at 6 (six) AM.     Vitamin D-Vitamin K (K2 PLUS D3 PO) Take 500 tablets by mouth daily at 6 (six) AM.     calcium carbonate (CALCIUM 600) 600 MG TABS tablet Take 1 tablet (600 mg total) by mouth 3 (three) times daily with meals.     No current facility-administered medications for this visit.   Facility-Administered Medications Ordered in Other Visits  Medication Dose Route Frequency Provider Last Rate Last Admin   acetaminophen (TYLENOL) 325 MG tablet            dexamethasone (DECADRON) 4 MG tablet            diphenhydrAMINE (BENADRYL) 25 mg capsule              PHYSICAL EXAMINATION: ECOG PERFORMANCE STATUS: 1 - Symptomatic but completely ambulatory Vitals:   06/12/23 1355  BP: 129/82  Pulse: 72  Resp: 18  Temp: 99.1 F (37.3 C)  SpO2: 98%   Filed Weights   06/12/23 1355  Weight: 287 lb 4.8 oz (130.3 kg)    Physical Exam Constitutional:      General: He is not in acute distress.    Appearance: He is obese.     Comments: Patient walks with a cane.  HENT:     Head: Normocephalic and atraumatic.  Eyes:     General: No scleral icterus. Cardiovascular:     Rate and Rhythm: Normal rate and regular rhythm.     Heart sounds: Normal heart sounds.  Pulmonary:  Effort: Pulmonary effort is normal. No respiratory distress.      Breath sounds: No wheezing.  Abdominal:     General: Bowel sounds are normal. There is no distension.     Palpations: Abdomen is soft.  Musculoskeletal:        General: No deformity. Normal range of motion.     Cervical back: Normal range of motion and neck supple.     Comments: Trace edema, bilateral low extremities.   Skin:    General: Skin is warm and dry.     Findings: No erythema or rash.  Neurological:     Mental Status: He is alert and oriented to person, place, and time. Mental status is at baseline.     Cranial Nerves: No cranial nerve deficit.     Coordination: Coordination normal.  Psychiatric:        Mood and Affect: Mood normal.      LABORATORY DATA:  I have reviewed the data as listed    Latest Ref Rng & Units 06/12/2023    1:42 PM 05/15/2023   12:38 PM 04/10/2023   10:43 AM  CBC  WBC 4.0 - 10.5 K/uL 3.8  6.2  5.5   Hemoglobin 13.0 - 17.0 g/dL 45.4  09.8  11.9   Hematocrit 39.0 - 52.0 % 36.6  38.4  37.7   Platelets 150 - 400 K/uL 192  247  238       Latest Ref Rng & Units 06/12/2023    1:42 PM 05/15/2023   12:38 PM 04/10/2023   10:43 AM  CMP  Glucose 70 - 99 mg/dL 147  829  562   BUN 8 - 23 mg/dL 10  17  17    Creatinine 0.61 - 1.24 mg/dL 1.30  8.65  7.84   Sodium 135 - 145 mmol/L 135  137  136   Potassium 3.5 - 5.1 mmol/L 3.4  4.1  3.8   Chloride 98 - 111 mmol/L 105  103  103   CO2 22 - 32 mmol/L 24  26  25    Calcium 8.9 - 10.3 mg/dL 8.5  9.1  9.3   Total Protein 6.5 - 8.1 g/dL 7.1  7.5  7.1   Total Bilirubin 0.0 - 1.2 mg/dL 0.3  0.4  0.5   Alkaline Phos 38 - 126 U/L 60  71  66   AST 15 - 41 U/L 18  16  19    ALT 0 - 44 U/L 8  7  9       RADIOGRAPHIC STUDIES: I have personally reviewed the radiological images as listed and agreed with the findings in the report.  CT CARDIAC SCORING (SELF PAY ONLY) Addendum Date: 04/29/2023 ADDENDUM REPORT: 04/29/2023 22:25 EXAM: OVER-READ INTERPRETATION  CT CHEST The following report is an over-read performed by  radiologist Dr. Narda Rutherford of [facility] on 04/29/2023. This over-read does not include interpretation of cardiac or coronary anatomy or pathology. The coronary calcium score interpretation by the cardiologist is attached. COMPARISON:  PET CT 08/12/2021, chest CT 07/09/2019 FINDINGS: Vascular: Mild aortic atherosclerosis. The included aorta is normal in caliber. Mediastinum/nodes: No adenopathy or mass. Unremarkable esophagus. Lungs: No focal airspace disease. 3 mm perifissural right upper lobe nodule series 4, image 10. This is unchanged from 2021 exam and considered definitively benign. No further follow-up is recommended per Fleischner society guidelines. No pleural fluid. The included airways are patent. Upper abdomen: No acute or unexpected findings. Musculoskeletal: There are no acute or suspicious osseous  abnormalities. Thoracic spondylosis with anterior spurring. IMPRESSION: Aortic Atherosclerosis (ICD10-I70.0). Electronically Signed   By: Narda Rutherford M.D.   On: 04/29/2023 22:25   Result Date: 04/29/2023 CLINICAL DATA:  Cardiovascular Disease Risk stratification EXAM: Coronary Calcium Score TECHNIQUE: A gated, non-contrast computed tomography scan of the heart was performed using 3 mm slice thickness. Axial images were analyzed on a dedicated workstation. Calcium scoring of the coronary arteries was performed using the Agatston method. FINDINGS: Coronary arteries: Normal origins. Coronary Calcium Score: Left main: 0 Left anterior descending artery: 351 Left circumflex artery: 181 Right coronary artery: 132 Total: 664 Percentile: 92nd Pericardium: Normal. Aorta: Normal caliber.  Aortic atherosclerosis. Non-cardiac: See separate report from [facility]. IMPRESSION: 1. Coronary calcium score of 664. This was 92nd percentile for age-, race-, and sex-matched controls (MESA). 2. Aortic atherosclerosis. RECOMMENDATIONS: Coronary artery calcium (CAC) score is a strong predictor of  incident coronary heart disease (CHD) and provides predictive information beyond traditional risk factors. CAC scoring is reasonable to use in the decision to withhold, postpone, or initiate statin therapy in intermediate-risk or selected borderline-risk asymptomatic adults (age 95-75 years and LDL-C >=70 to <190 mg/dL) who do not have diabetes or established atherosclerotic cardiovascular disease (ASCVD).* In intermediate-risk (10-year ASCVD risk >=7.5% to <20%) adults or selected borderline-risk (10-year ASCVD risk >=5% to <7.5%) adults in whom a CAC score is measured for the purpose of making a treatment decision the following recommendations have been made: If CAC=0, it is reasonable to withhold statin therapy and reassess in 5 to 10 years, as long as higher risk conditions are absent (diabetes mellitus, family history of premature CHD in first degree relatives (males <55 years; females <65 years), cigarette smoking, or LDL >=190 mg/dL). If CAC is 1 to 99, it is reasonable to initiate statin therapy for patients >=55 years of age. If CAC is >=100 or >=75th percentile, it is reasonable to initiate statin therapy at any age. Cardiology referral should be considered for patients with CAC scores >=400 or >=75th percentile. *2018 AHA/ACC/AACVPR/AAPA/ABC/ACPM/ADA/AGS/APhA/ASPC/NLA/PCNA Guideline on the Management of Blood Cholesterol: A Report of the American College of Cardiology/American Heart Association Task Force on Clinical Practice Guidelines. J Am Coll Cardiol. 2019;73(24):3168-3209. Zoila Shutter, MD Electronically Signed: By: Chrystie Nose M.D. On: 04/23/2023 16:51

## 2023-06-12 NOTE — Assessment & Plan Note (Addendum)
 Intermittent symptoms.  Encourage oral hydration. Improved after he tried dietary changes. Recommend Imodium as needed per instruction Patient is reluctant to take.

## 2023-06-13 LAB — KAPPA/LAMBDA LIGHT CHAINS
Kappa free light chain: 36.2 mg/L — ABNORMAL HIGH (ref 3.3–19.4)
Kappa, lambda light chain ratio: 1.66 — ABNORMAL HIGH (ref 0.26–1.65)
Lambda free light chains: 21.8 mg/L (ref 5.7–26.3)

## 2023-06-18 ENCOUNTER — Telehealth: Payer: Self-pay | Admitting: Nurse Practitioner

## 2023-06-18 LAB — MULTIPLE MYELOMA PANEL, SERUM
Albumin SerPl Elph-Mcnc: 3.3 g/dL (ref 2.9–4.4)
Albumin/Glob SerPl: 1.1 (ref 0.7–1.7)
Alpha 1: 0.3 g/dL (ref 0.0–0.4)
Alpha2 Glob SerPl Elph-Mcnc: 0.7 g/dL (ref 0.4–1.0)
B-Globulin SerPl Elph-Mcnc: 1.1 g/dL (ref 0.7–1.3)
Gamma Glob SerPl Elph-Mcnc: 1.2 g/dL (ref 0.4–1.8)
Globulin, Total: 3.2 g/dL (ref 2.2–3.9)
IgA: 221 mg/dL (ref 61–437)
IgG (Immunoglobin G), Serum: 1280 mg/dL (ref 603–1613)
IgM (Immunoglobulin M), Srm: 39 mg/dL (ref 20–172)
Total Protein ELP: 6.5 g/dL (ref 6.0–8.5)

## 2023-06-18 NOTE — Telephone Encounter (Signed)
 Pt said [facility] faxed [place] a form that needs to be filled out by the doctor and he wants to know if it has been done. Please advice

## 2023-06-18 NOTE — Telephone Encounter (Signed)
 Spoke with patient states [employer] informed him they sent forms that need to be signed by provider or they will cancel his health plan. I di not see a form in chart. He does have a copy he can bring tomorrow. He will need before 3/25 or his health plan will be cancelled.

## 2023-06-19 ENCOUNTER — Telehealth: Payer: Self-pay | Admitting: Nurse Practitioner

## 2023-06-19 NOTE — Telephone Encounter (Signed)
 New message   Paper Work Dropped Off:   Date:  06-19-23  Location of paper:  Paperwork from [employer] regarding chronic condition verification was put in Eric Cruz's box.

## 2023-06-21 NOTE — Telephone Encounter (Signed)
 Forms were dropped off on Tuesday 06/18/23.  [employer] has completed forms. See other note.

## 2023-06-22 ENCOUNTER — Other Ambulatory Visit: Payer: Self-pay

## 2023-06-22 DIAGNOSIS — C9000 Multiple myeloma not having achieved remission: Secondary | ICD-10-CM

## 2023-06-22 MED ORDER — LENALIDOMIDE 10 MG PO CAPS: 10.0000 mg | ORAL_CAPSULE | Freq: Every day | ORAL | 0 refills | Status: DC

## 2023-06-29 NOTE — Telephone Encounter (Signed)
error 

## 2023-06-29 NOTE — Telephone Encounter (Signed)
 Patient following up on paperwork that was dropped off. Please advise.

## 2023-06-29 NOTE — Telephone Encounter (Signed)
 Spoke with pt. Told him the forms had been faxed. Put copy of the paperwork including fax cover sheet at front for pt to pick up.

## 2023-07-07 LAB — LIPID PANEL
Chol/HDL Ratio: 2.3 ratio (ref 0.0–5.0)
Cholesterol, Total: 150 mg/dL (ref 100–199)
HDL: 66 mg/dL (ref >39)
LDL Chol Calc (NIH): 58 mg/dL (ref 0–99)
Triglycerides: 159 mg/dL — ABNORMAL HIGH (ref 0–149)
VLDL Cholesterol Cal: 26 mg/dL (ref 5–40)

## 2023-07-07 LAB — HEPATIC FUNCTION PANEL
ALT: 6 IU/L (ref 0–44)
AST: 16 IU/L (ref 0–40)
Albumin: 4.1 g/dL (ref 3.9–4.9)
Alkaline Phosphatase: 78 IU/L (ref 44–121)
Bilirubin Total: 0.3 mg/dL (ref 0.0–1.2)
Bilirubin, Direct: 0.12 mg/dL (ref 0.00–0.40)
Total Protein: 6.8 g/dL (ref 6.0–8.5)

## 2023-07-10 ENCOUNTER — Inpatient Hospital Stay: Attending: Oncology

## 2023-07-10 ENCOUNTER — Inpatient Hospital Stay: Admitting: Oncology

## 2023-07-10 ENCOUNTER — Encounter: Payer: Self-pay | Admitting: Oncology

## 2023-07-10 VITALS — BP 124/64 | HR 65 | Temp 97.2°F | Resp 18 | Wt 283.0 lb

## 2023-07-10 DIAGNOSIS — C9000 Multiple myeloma not having achieved remission: Secondary | ICD-10-CM | POA: Insufficient documentation

## 2023-07-10 DIAGNOSIS — Z79899 Other long term (current) drug therapy: Secondary | ICD-10-CM | POA: Insufficient documentation

## 2023-07-10 DIAGNOSIS — K521 Toxic gastroenteritis and colitis: Secondary | ICD-10-CM

## 2023-07-10 DIAGNOSIS — Z79624 Long term (current) use of inhibitors of nucleotide synthesis: Secondary | ICD-10-CM | POA: Diagnosis not present

## 2023-07-10 DIAGNOSIS — Z9481 Bone marrow transplant status: Secondary | ICD-10-CM | POA: Insufficient documentation

## 2023-07-10 DIAGNOSIS — Z87891 Personal history of nicotine dependence: Secondary | ICD-10-CM | POA: Insufficient documentation

## 2023-07-10 DIAGNOSIS — Z5111 Encounter for antineoplastic chemotherapy: Secondary | ICD-10-CM

## 2023-07-10 DIAGNOSIS — Z7982 Long term (current) use of aspirin: Secondary | ICD-10-CM | POA: Insufficient documentation

## 2023-07-10 DIAGNOSIS — Z7961 Long term (current) use of immunomodulator: Secondary | ICD-10-CM | POA: Insufficient documentation

## 2023-07-10 DIAGNOSIS — T451X5A Adverse effect of antineoplastic and immunosuppressive drugs, initial encounter: Secondary | ICD-10-CM

## 2023-07-10 DIAGNOSIS — Z9484 Stem cells transplant status: Secondary | ICD-10-CM | POA: Diagnosis not present

## 2023-07-10 LAB — CBC WITH DIFFERENTIAL (CANCER CENTER ONLY)
Abs Immature Granulocytes: 0.02 10*3/uL (ref 0.00–0.07)
Basophils Absolute: 0.1 10*3/uL (ref 0.0–0.1)
Basophils Relative: 1 %
Eosinophils Absolute: 0.1 10*3/uL (ref 0.0–0.5)
Eosinophils Relative: 2 %
HCT: 38.4 % — ABNORMAL LOW (ref 39.0–52.0)
Hemoglobin: 12.5 g/dL — ABNORMAL LOW (ref 13.0–17.0)
Immature Granulocytes: 0 %
Lymphocytes Relative: 24 %
Lymphs Abs: 1.3 10*3/uL (ref 0.7–4.0)
MCH: 31.5 pg (ref 26.0–34.0)
MCHC: 32.6 g/dL (ref 30.0–36.0)
MCV: 96.7 fL (ref 80.0–100.0)
Monocytes Absolute: 0.8 10*3/uL (ref 0.1–1.0)
Monocytes Relative: 13 %
Neutro Abs: 3.3 10*3/uL (ref 1.7–7.7)
Neutrophils Relative %: 60 %
Platelet Count: 230 10*3/uL (ref 150–400)
RBC: 3.97 MIL/uL — ABNORMAL LOW (ref 4.22–5.81)
RDW: 15 % (ref 11.5–15.5)
WBC Count: 5.6 10*3/uL (ref 4.0–10.5)
nRBC: 0 % (ref 0.0–0.2)

## 2023-07-10 LAB — CMP (CANCER CENTER ONLY)
ALT: 8 U/L (ref 0–44)
AST: 19 U/L (ref 15–41)
Albumin: 3.8 g/dL (ref 3.5–5.0)
Alkaline Phosphatase: 58 U/L (ref 38–126)
Anion gap: 9 (ref 5–15)
BUN: 14 mg/dL (ref 8–23)
CO2: 24 mmol/L (ref 22–32)
Calcium: 9.3 mg/dL (ref 8.9–10.3)
Chloride: 104 mmol/L (ref 98–111)
Creatinine: 0.94 mg/dL (ref 0.61–1.24)
GFR, Estimated: >60 mL/min (ref >60)
Glucose, Bld: 98 mg/dL (ref 70–99)
Potassium: 3.8 mmol/L (ref 3.5–5.1)
Sodium: 137 mmol/L (ref 135–145)
Total Bilirubin: 0.2 mg/dL (ref 0.0–1.2)
Total Protein: 7.4 g/dL (ref 6.5–8.1)

## 2023-07-10 NOTE — Assessment & Plan Note (Signed)
 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 myeloma/bone marrow transplant notes were reviewed. 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  vaccinations per protocol per  [facility] are reviewed and discussed with patient. Light chain ratio gradually increases.  maintenance with revlimid 10 mg on days 1-21 of 28 day cycle - proceed with this cycle Revlimid -  Zometa  - plan Q3 months to continue to 2 years of therapy [until Nov 2025], next due May 2025 Recommend calcium with vitamin D supplementation.

## 2023-07-10 NOTE — Progress Notes (Signed)
 Hematology/Oncology Progress note Telephone:([phone] Fax:([phone]      CHIEF COMPLAINTS/REASON FOR VISIT:  Follow-up for multiple myeloma   ASSESSMENT & PLAN:   Cancer Staging  Multiple myeloma not having achieved remission (HCC) Staging form: Plasma Cell Myeloma and Plasma Cell Disorders, AJCC 8th Edition - Clinical stage from 04/22/2020: RISS Stage II (Beta-2-microglobulin (mg/L): 2.7, Albumin (g/dL): 3.1, ISS: Stage II, High-risk cytogenetics: Absent, LDH: Normal) - Signed by Rickard Patience, MD on 08/10/2021   Multiple myeloma not having achieved remission (HCC) 0IgA lambda multiple myeloma, M protein 2.3, normal free light chain ratio Status post Dara Rvd, achieved CR, 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  vaccinations per protocol per  [facility] are reviewed and discussed with patient. Light chain ratio gradually increases.  maintenance with revlimid 10 mg on days 1-21 of 28 day cycle - proceed with this cycle Revlimid -  Zometa  - plan Q3 months to continue to 2 years of therapy [until Nov 2025], next due May 2025 Recommend calcium with vitamin D supplementation.   Chemotherapy induced diarrhea Intermittent symptoms.  Encourage oral hydration. Improved after he tried dietary changes. Recommend Imodium as needed per instruction Patient is reluctant to take.    Encounter for antineoplastic chemotherapy Chemotherapy plan as listed above.  Hypocalcemia Likely due to recent Zometa.  Recommend increase calcium to 600mg  TID      Orders Placed This Encounter  Procedures    CBC with Differential (Cancer Center Only)    Standing Status:   Future    Expected Date:   08/07/2023    Expiration Date:   07/09/2024   CMP (Cancer Center only)    Standing Status:   Future    Expected Date:   08/07/2023    Expiration Date:   07/09/2024   Kappa/lambda light chains    Standing Status:   Future    Expected Date:   08/07/2023    Expiration Date:   07/09/2024   Multiple Myeloma Panel (SPEP&IFE w/QIG)    Standing Status:   Future    Expected Date:   08/07/2023    Expiration Date:   07/09/2024    Follow up in 4 weeks.  All questions were answered. The patient knows to call the clinic with any problems, questions or concerns.  Rickard Patience, MD, PhD [facility] Hematology Oncology 07/10/2023    HISTORY OF PRESENTING ILLNESS:  Eric Cruz is a 73 y.o. male presents for follow up of myeloma .  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  27% plasma cell, cytogenetics - normal, and myeloma FISH panel is negative.  Standard risk.  Congo red staining was added and was negative.  - 07/27/2021, bone marrow biopsy showed hypercellular marrow involved by plasma cell myeloma, CD138 immunohistochemistry plasma cells compromise approximately 70% of the cellular elements and are lambda restricted by light chain in situ hybridization.  Cytogenetics showed duplication of 1q.  Cytogenetics is normal.   04/22/2020 Cancer Staging  Staging form: Plasma Cell Myeloma and Plasma Cell Disorders, AJCC 8th Edition - Clinical stage from 04/22/2020: RISS Stage II (Beta-2-microglobulin (mg/L): 2.7, Albumin (g/dL): 3.1, ISS: Stage II, High-risk cytogenetics: Absent, LDH: Normal) - Signed by Rickard Patience, MD on 08/10/2021 Stage prefix: Initial diagnosis Beta 2 microglobulin range (mg/L): Less than 3.5 Albumin range (g/dL):  Less than 3.5 Cytogenetics: 1q addition Lactate dehydrogenase (LDH) (U/L): 136 Serum calcium level: Normal Serum creatinine level: Normal   08/12/2021 Imaging   PET scan showed 1. No evidence of active myeloma on skull base to thigh FDG PET scan. 2. No soft tissue plasmacytoma.3. No lytic or suspicious lesion on CT portion exam.     08/22/2021 - 01/11/2022 Chemotherapy   Patient is on Treatment Plan : MYELOMA NEWLY DIAGNOSED TRANSPLANT CANDIDATE DaraVRd (Daratumumab SQ) q21d x 6 Cycles (Induction/Consolidation)     08/24/2021 - 11/09/2021 Chemotherapy   Patient is on Treatment Plan : MYELOMA NEWLY DIAGNOSED TRANSPLANT CANDIDATE DaraVRd (Daratumumab SQ) q21d x 6 Cycles (Induction/Consolidation)     11/02/2021 Imaging   Bilateral lower extremity US negative for DVT   11/15/2021 Echocardiogram   1. Left ventricular ejection fraction, by estimation, is 60 to 65%. The left ventricle has normal function. The left ventricle has no regional wall motion abnormalities. Left ventricular diastolic parameters are consistent with Grade II diastolic dysfunction (pseudonormalization). The average left ventricular global longitudinal strain is -17.4 %.   2. Right ventricular systolic function is normal. The right ventricular size is normal. There is mildly elevated pulmonary artery systolic pressure. The estimated right ventricular systolic pressure is 37.7 mmHg.   3. The mitral valve is normal in structure. Mild mitral valve regurgitation. No evidence of mitral stenosis.  4. The aortic valve was not well visualized. Aortic valve regurgitation  is not visualized. No aortic stenosis is present.   5. The inferior vena cava is normal in size with greater than 50% respiratory variability, suggesting right atrial pressure of 3 mmHg.    01/23/2022 Bone Marrow Biopsy   Mildly hypercellular bone marrow (40%) with increased erythropoiesis and 2% plasma cells.    02/15/2022 Procedure   Status post autologous stem cell  transplant at [facility] Preparative regimen with Melphalan 200 mg/m2 on 02/14/22 Infusion of stem cells - 6.7 x 10(6) on 02/15/22    06/13/2022 Procedure   Bone marrow biopsy at [facility] showed: Mildly hypercellular bone marrow (40%) with trilineage hematopoiesis and no increase in plasma cells. MRD pending    06/30/2022 -  Chemotherapy   Started on maintenance Revlimid 10mg  D1-21 Q28 days.     For rheumatoid arthritis, patient was seen by Dr. Corliss Skains recently and is currently off methotrexate and Humira.  + Bilateral lower extremity swelling.[place] negative for DVT, rade II diastolic dysfunction one Echo  INTERVAL HISTORY Eric Cruz is a 73 y.o. male who has above history reviewed by me today presents for follow up visit for management of multiple myeloma He reports feeling well.  He has been eating healthy, stable weight.  He has aortic atheroscelrosis, previously did not tolerate statin treatments. There is plan to start PCSK9 inhibitor Repatha 140 mg q. 14 days    Review of Systems  Constitutional:  Negative for appetite change, chills, fatigue, fever and unexpected weight change.  HENT:   Negative for hearing loss and voice change.   Eyes:  Negative for eye problems and icterus.  Respiratory:  Negative for chest tightness, cough and shortness of breath.   Cardiovascular:  Negative for chest pain and leg swelling.  Gastrointestinal:  Negative for abdominal distention and abdominal pain.  Endocrine: Negative for hot flashes.  Genitourinary:  Negative for difficulty urinating, dysuria and frequency.   Musculoskeletal:  Positive for arthralgias and back pain. Negative for myalgias.  Skin:  Negative for itching and rash.  Neurological:  Negative for light-headedness and numbness.  Hematological:  Negative for adenopathy. Does not bruise/bleed easily.  Psychiatric/Behavioral:  Negative for confusion.      MEDICAL HISTORY:  Past Medical History:   Diagnosis Date   Arthritis    BPH (benign prostatic hyperplasia)    Cancer (HCC)    Prostate: states he had a positive biopsy but had another one a year later and it was gone.    Depression    Dizziness    GERD (gastroesophageal reflux disease)    Gout    High cholesterol    Hypertension    Sleep apnea    has cpap - not currently wearing   Varicose veins     SURGICAL HISTORY: Past Surgical History:  Procedure Laterality Date   ANTERIOR CERVICAL DECOMP/DISCECTOMY FUSION N/A 02/21/2018   Procedure: Cervical Five-Six Cervical Six-Seven Anterior cervical decompression/discectomy/fusion;  Surgeon: Coletta Memos, MD;  Location: [facility] OR;  Service: Neurosurgery;  Laterality: N/A;  Cervical Five-Six Cervical Six-Seven Anterior cervical decompression/discectomy/fusion   BIOPSY  10/09/2019   Procedure: BIOPSY;  Surgeon: Kerin Salen, MD;  Location: [facility];  Service: Gastroenterology;;   BLADDER SURGERY     CARPAL TUNNEL RELEASE Right 02/11/2019   Procedure: Right Carpal Tunnel Wound Irrigation;  Surgeon: Coletta Memos, MD;  [facility];  Service: Neurosurgery;  Laterality: Right;  Right Carpal tunnel wound exploration/wash out   CARPAL TUNNEL RELEASE Bilateral    CHOLECYSTECTOMY  11/22/2012   CHOLECYSTECTOMY  11/22/2012   Procedure: LAPAROSCOPIC CHOLECYSTECTOMY;  Surgeon: Cherylynn Ridges, MD;  Location: [facility] OR;  Service: General;;   COLONOSCOPY     COLONOSCOPY WITH PROPOFOL N/A 10/09/2019   Procedure: COLONOSCOPY WITH PROPOFOL;  Surgeon: Kerin Salen, MD;  Location: [facility];  Service: Gastroenterology;  Laterality: N/A;   ESOPHAGOGASTRODUODENOSCOPY (EGD) WITH PROPOFOL N/A 10/09/2019   Procedure: ESOPHAGOGASTRODUODENOSCOPY (EGD) WITH PROPOFOL;  Surgeon: Kerin Salen, MD;  Location: [facility];  Service: Gastroenterology;  Laterality: N/A;   POLYPECTOMY  10/09/2019   Procedure: POLYPECTOMY;  Surgeon: Kerin Salen, MD;  Location: [facility];  Service: Gastroenterology;;   stem cell  transplant  02/2022    SOCIAL HISTORY: Social History   Socioeconomic History   Marital status: Married    Spouse name: Not on file   Number of children: 2   Years of education: Not on file   Highest education level: Not on file  Occupational History   Occupation: Retired-mechanic  Tobacco Use   Smoking status: Former    Current packs/day: 0.00    Average packs/day: 1 pack/day for 31.0 years (31.0 ttl pk-yrs)    Types: Cigarettes    Start date: 04/13/1967    Quit date: 04/12/1998    Years since quitting: 25.2    Passive exposure: Never   Smokeless tobacco: Never  Vaping Use   Vaping status: Never Used  Substance and Sexual Activity   Alcohol use: No   Drug use: No   Sexual activity: Not on file  Other Topics Concern   Not on file  Social History Narrative   Not on file   Social Drivers of Health   Financial Resource Strain: Not on file  Food  Insecurity: Not on file  Transportation Needs: Not on file  Physical Activity: Not on file  Stress: Not on file  Social Connections: Not on file  Intimate Partner Violence: Not on file    FAMILY HISTORY: Family History  Problem Relation Age of Onset   Hypertension Mother    Diabetes Mother    Multiple myeloma Mother    Hypertension Father    Diabetes Father    Healthy Son    Healthy Daughter     ALLERGIES:  is allergic to atorvastatin, simvastatin, other, and statins.  MEDICATIONS:  Current Outpatient Medications  Medication Sig Dispense Refill   Ascorbic Acid (VITA-C PO) Take 2,000 tablets by mouth daily at 6 (six) AM.     aspirin EC 81 MG tablet Take 81 mg by mouth daily. Swallow whole.     calcium carbonate (CALCIUM 600) 600 MG TABS tablet Take 1 tablet (600 mg total) by mouth 3 (three) times daily with meals.     Cyanocobalamin (B-12) 2500 MCG TABS Take by mouth daily at 6 (six) AM.     Ensure (ENSURE) Take 237 mLs by mouth daily at 6 (six) AM.     Evolocumab (REPATHA SURECLICK) 140 MG/ML SOAJ Inject 140 mg  into the skin every 14 (fourteen) days. 2 mL 10   ferrous sulfate 325 (65 FE) MG EC tablet Take 325 mg by mouth daily at 6 (six) AM.     finasteride (PROSCAR) 5 MG tablet Take 5 mg by mouth daily.     furosemide (LASIX) 40 MG tablet TAKE 1 TABLET BY MOUTH DAILY 90 tablet 3   lenalidomide (REVLIMID) 10 MG capsule Take 1 capsule (10 mg total) by mouth daily. Take 1 capsule by mouth once daily for 21 days, then 7 days off. 21 capsule 0   Multiple Vitamin (MULTIVITAMIN ADULT PO) Take by mouth.     tamsulosin (FLOMAX) 0.4 MG CAPS capsule Take 0.4 mg by mouth at bedtime.     Turmeric 500 MG CAPS Take by mouth daily at 6 (six) AM.     Vitamin D-Vitamin K (K2 PLUS D3 PO) Take 500 tablets by mouth daily at 6 (six) AM.     No current facility-administered medications for this visit.   Facility-Administered Medications Ordered in Other Visits  Medication Dose Route Frequency Provider Last Rate Last Admin   acetaminophen (TYLENOL) 325 MG tablet            dexamethasone (DECADRON) 4 MG tablet            diphenhydrAMINE (BENADRYL) 25 mg capsule              PHYSICAL EXAMINATION: ECOG PERFORMANCE STATUS: 1 - Symptomatic but completely ambulatory Vitals:   07/10/23 1333  BP: 124/64  Pulse: 65  Resp: 18  Temp: (!) 97.2 F (36.2 C)  SpO2: 100%   Filed Weights   07/10/23 1333  Weight: 283 lb (128.4 kg)    Physical Exam Constitutional:      General: He is not in acute distress.    Appearance: He is obese.     Comments: Patient walks with a cane.  HENT:     Head: Normocephalic and atraumatic.  Eyes:     General: No scleral icterus. Cardiovascular:     Rate and Rhythm: Normal rate and regular rhythm.     Heart sounds: Normal heart sounds.  Pulmonary:     Effort: Pulmonary effort is normal. No respiratory distress.     Breath sounds:  No wheezing.  Abdominal:     General: Bowel sounds are normal. There is no distension.     Palpations: Abdomen is soft.  Musculoskeletal:        General:  No deformity. Normal range of motion.     Cervical back: Normal range of motion and neck supple.     Comments: Trace edema, bilateral low extremities.   Skin:    General: Skin is warm and dry.     Findings: No erythema or rash.  Neurological:     Mental Status: He is alert and oriented to person, place, and time. Mental status is at baseline.     Cranial Nerves: No cranial nerve deficit.     Coordination: Coordination normal.  Psychiatric:        Mood and Affect: Mood normal.      LABORATORY DATA:  I have reviewed the data as listed    Latest Ref Rng & Units 07/10/2023    1:08 PM 06/12/2023    1:42 PM 05/15/2023   12:38 PM  CBC  WBC 4.0 - 10.5 K/uL 5.6  3.8  6.2   Hemoglobin 13.0 - 17.0 g/dL 40.9  81.1  91.4   Hematocrit 39.0 - 52.0 % 38.4  36.6  38.4   Platelets 150 - 400 K/uL 230  192  247       Latest Ref Rng & Units 07/10/2023    1:08 PM 07/06/2023    3:05 PM 06/12/2023    1:42 PM  CMP  Glucose 70 - 99 mg/dL 98   782   BUN 8 - 23 mg/dL 14   10   Creatinine 9.56 - 1.24 mg/dL 2.13   0.86   Sodium 578 - 145 mmol/L 137   135   Potassium 3.5 - 5.1 mmol/L 3.8   3.4   Chloride 98 - 111 mmol/L 104   105   CO2 22 - 32 mmol/L 24   24   Calcium 8.9 - 10.3 mg/dL 9.3   8.5   Total Protein 6.5 - 8.1 g/dL 7.4  6.8  7.1   Total Bilirubin 0.0 - 1.2 mg/dL 0.2  0.3  0.3   Alkaline Phos 38 - 126 U/L 58  78  60   AST 15 - 41 U/L 19  16  18    ALT 0 - 44 U/L 8  6  8       RADIOGRAPHIC STUDIES: I have personally reviewed the radiological images as listed and agreed with the findings in the report.  CT CARDIAC SCORING (SELF PAY ONLY) Addendum Date: 04/29/2023 ADDENDUM REPORT: 04/29/2023 22:25 EXAM: OVER-READ INTERPRETATION  CT CHEST The following report is an over-read performed by radiologist Dr. Narda Rutherford of [facility] on 04/29/2023. This over-read does not include interpretation of cardiac or coronary anatomy or pathology. The coronary calcium score interpretation by the  cardiologist is attached. COMPARISON:  PET CT 08/12/2021, chest CT 07/09/2019 FINDINGS: Vascular: Mild aortic atherosclerosis. The included aorta is normal in caliber. Mediastinum/nodes: No adenopathy or mass. Unremarkable esophagus. Lungs: No focal airspace disease. 3 mm perifissural right upper lobe nodule series 4, image 10. This is unchanged from 2021 exam and considered definitively benign. No further follow-up is recommended per Fleischner society guidelines. No pleural fluid. The included airways are patent. Upper abdomen: No acute or unexpected findings. Musculoskeletal: There are no acute or suspicious osseous abnormalities. Thoracic spondylosis with anterior spurring. IMPRESSION: Aortic Atherosclerosis (ICD10-I70.0). Electronically Signed   By: Ivette Loyal.D.  On: 04/29/2023 22:25   Result Date: 04/29/2023 CLINICAL DATA:  Cardiovascular Disease Risk stratification EXAM: Coronary Calcium Score TECHNIQUE: A gated, non-contrast computed tomography scan of the heart was performed using 3 mm slice thickness. Axial images were analyzed on a dedicated workstation. Calcium scoring of the coronary arteries was performed using the Agatston method. FINDINGS: Coronary arteries: Normal origins. Coronary Calcium Score: Left main: 0 Left anterior descending artery: 351 Left circumflex artery: 181 Right coronary artery: 132 Total: 664 Percentile: 92nd Pericardium: Normal. Aorta: Normal caliber.  Aortic atherosclerosis. Non-cardiac: See separate report from [facility]. IMPRESSION: 1. Coronary calcium score of 664. This was 92nd percentile for age-, race-, and sex-matched controls (MESA). 2. Aortic atherosclerosis. RECOMMENDATIONS: Coronary artery calcium (CAC) score is a strong predictor of incident coronary heart disease (CHD) and provides predictive information beyond traditional risk factors. CAC scoring is reasonable to use in the decision to withhold, postpone, or initiate statin therapy in  intermediate-risk or selected borderline-risk asymptomatic adults (age 18-75 years and LDL-C >=70 to <190 mg/dL) who do not have diabetes or established atherosclerotic cardiovascular disease (ASCVD).* In intermediate-risk (10-year ASCVD risk >=7.5% to <20%) adults or selected borderline-risk (10-year ASCVD risk >=5% to <7.5%) adults in whom a CAC score is measured for the purpose of making a treatment decision the following recommendations have been made: If CAC=0, it is reasonable to withhold statin therapy and reassess in 5 to 10 years, as long as higher risk conditions are absent (diabetes mellitus, family history of premature CHD in first degree relatives (males <55 years; females <65 years), cigarette smoking, or LDL >=190 mg/dL). If CAC is 1 to 99, it is reasonable to initiate statin therapy for patients >=40 years of age. If CAC is >=100 or >=75th percentile, it is reasonable to initiate statin therapy at any age. Cardiology referral should be considered for patients with CAC scores >=400 or >=75th percentile. *2018 AHA/ACC/AACVPR/AAPA/ABC/ACPM/ADA/AGS/APhA/ASPC/NLA/PCNA Guideline on the Management of Blood Cholesterol: A Report of the American College of Cardiology/American Heart Association Task Force on Clinical Practice Guidelines. J Am Coll Cardiol. 2019;73(24):3168-3209. Zoila Shutter, MD Electronically Signed: By: Chrystie Nose M.D. On: 04/23/2023 16:51

## 2023-07-10 NOTE — Assessment & Plan Note (Signed)
 Chemotherapy plan as listed above

## 2023-07-10 NOTE — Assessment & Plan Note (Signed)
 Likely due to recent Zometa.  Recommend increase calcium to 600mg  TID

## 2023-07-10 NOTE — Assessment & Plan Note (Signed)
 Intermittent symptoms.  Encourage oral hydration. Improved after he tried dietary changes. Recommend Imodium as needed per instruction Patient is reluctant to take.

## 2023-07-11 LAB — KAPPA/LAMBDA LIGHT CHAINS
Kappa free light chain: 31.5 mg/L — ABNORMAL HIGH (ref 3.3–19.4)
Kappa, lambda light chain ratio: 1.54 (ref 0.26–1.65)
Lambda free light chains: 20.4 mg/L (ref 5.7–26.3)

## 2023-07-12 LAB — MULTIPLE MYELOMA PANEL, SERUM
Albumin SerPl Elph-Mcnc: 3.6 g/dL (ref 2.9–4.4)
Albumin/Glob SerPl: 1.1 (ref 0.7–1.7)
Alpha 1: 0.3 g/dL (ref 0.0–0.4)
Alpha2 Glob SerPl Elph-Mcnc: 0.8 g/dL (ref 0.4–1.0)
B-Globulin SerPl Elph-Mcnc: 1.2 g/dL (ref 0.7–1.3)
Gamma Glob SerPl Elph-Mcnc: 1.3 g/dL (ref 0.4–1.8)
Globulin, Total: 3.5 g/dL (ref 2.2–3.9)
IgA: 246 mg/dL (ref 61–437)
IgG (Immunoglobin G), Serum: 1395 mg/dL (ref 603–1613)
IgM (Immunoglobulin M), Srm: 47 mg/dL (ref 20–172)
Total Protein ELP: 7.1 g/dL (ref 6.0–8.5)

## 2023-07-24 NOTE — Progress Notes (Signed)
 Office Visit Note  Patient: Eric Cruz             Date of Birth: 01-14-1951           MRN: [id number]             PCP: Merl Star, MD Referring: Merl Star, MD Visit Date: 08/02/2023 Occupation: @GUAROCC @  Subjective:  Pain in right knee and tingling in hands.   History of Present Illness: Eric Cruz is a 73 y.o. male with seropositive rheumatoid arthritis, osteoarthritis and multiple myeloma.  He returns today after his last visit on January 30, 2023.  Patient states that he has been experiencing some tingling in his hands.  He states he has been more active and doing yard work.  He also experiences some discomfort in the front of his thighs after prolonged standing.  He gives history of intermittent discomfort in his right knee joint which happens about twice a week and last for few seconds.  He has not noticed any joint swelling.    Activities of Daily Living:  Patient reports morning stiffness for 0 minute.   Patient Denies nocturnal pain.  Difficulty dressing/grooming: Denies Difficulty climbing stairs: Denies Difficulty getting out of chair: Denies Difficulty using hands for taps, buttons, cutlery, and/or writing: Denies  Review of Systems  Constitutional:  Positive for fatigue.  HENT:  Negative for mouth sores and mouth dryness.   Eyes:  Negative for dryness.  Respiratory:  Negative for shortness of breath.   Cardiovascular:  Negative for palpitations.  Gastrointestinal:  Positive for diarrhea. Negative for blood in stool and constipation.  Endocrine: Positive for increased urination.  Genitourinary:  Positive for involuntary urination.  Musculoskeletal:  Positive for joint swelling, myalgias and myalgias. Negative for joint pain, gait problem, joint pain, muscle weakness, morning stiffness and muscle tenderness.  Skin:  Positive for sensitivity to sunlight. Negative for color change, rash and hair loss.  Allergic/Immunologic: Positive for  susceptible to infections.  Neurological:  Positive for headaches. Negative for dizziness.  Hematological:  Negative for swollen glands.  Psychiatric/Behavioral:  Positive for sleep disturbance. Negative for depressed mood. The patient is not nervous/anxious.     PMFS History:  Patient Active Problem List   Diagnosis Date Noted   Hypokalemia 06/12/2023   Hypocalcemia 06/12/2023   Coronary artery calcification 05/17/2023   Abnormal weight gain 01/11/2022   Constipation 01/11/2022   Epigastric pain 01/11/2022   Morbid obesity (HCC) 12/20/2021   Morbid obesity with BMI of 45.0-49.9, adult (HCC) 11/30/2021   Leg swelling 11/02/2021   Chemotherapy induced diarrhea 11/02/2021   Neuropathy 09/02/2021   Normocytic anemia 09/02/2021   Encounter for antineoplastic chemotherapy 08/24/2021   Chest pain 08/25/2020   Gastroesophageal reflux disease 08/25/2020   Goals of care, counseling/discussion 04/22/2020   Multiple myeloma not having achieved remission (HCC) 04/22/2020   Rheumatoid arthritis (HCC) 04/07/2020   High risk medication use 04/07/2020   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    Depression    Dizziness    GERD (  gastroesophageal reflux disease)    Gout    High cholesterol    Hypertension    Sleep apnea    has cpap - not currently wearing   Varicose veins     Family History  Problem Relation Age of Onset   Hypertension Mother    Diabetes Mother    Multiple myeloma Mother    Hypertension Father    Diabetes Father    Healthy Son    Healthy Daughter     Past Surgical History:  Procedure Laterality Date   ANTERIOR CERVICAL DECOMP/DISCECTOMY FUSION N/A 02/21/2018   Procedure: Cervical Five-Six Cervical Six-Seven Anterior cervical decompression/discectomy/fusion;  Surgeon: Audie Bleacher, MD;  Location: [facility] OR;  Service: Neurosurgery;  Laterality: N/A;  Cervical Five-Six Cervical Six-Seven Anterior cervical decompression/discectomy/fusion   BIOPSY  10/09/2019   Procedure: BIOPSY;  Surgeon: Genell Ken, MD;  Location: [facility];  Service: Gastroenterology;;   BLADDER SURGERY     CARPAL TUNNEL RELEASE Right 02/11/2019   Procedure: Right Carpal Tunnel Wound Irrigation;  Surgeon: Audie Bleacher, MD;  Location: [facility] OR;  Service: Neurosurgery;  Laterality: Right;  Right Carpal tunnel wound exploration/wash out   CARPAL TUNNEL RELEASE Bilateral    CHOLECYSTECTOMY  11/22/2012   CHOLECYSTECTOMY  11/22/2012   Procedure: LAPAROSCOPIC CHOLECYSTECTOMY;  Surgeon: Diantha Fossa, MD;  Location: [facility] OR;  Service: General;;   COLONOSCOPY     COLONOSCOPY WITH PROPOFOL  N/A 10/09/2019   Procedure: COLONOSCOPY WITH PROPOFOL ;  Surgeon: Genell Ken, MD;  Location: [facility];  Service: Gastroenterology;  Laterality: N/A;   ESOPHAGOGASTRODUODENOSCOPY (EGD) WITH PROPOFOL  N/A 10/09/2019   Procedure: ESOPHAGOGASTRODUODENOSCOPY (EGD) WITH PROPOFOL ;  Surgeon: Genell Ken, MD;  Location: [facility];  Service: Gastroenterology;  Laterality: N/A;   POLYPECTOMY  10/09/2019   Procedure: POLYPECTOMY;  Surgeon: Genell Ken, MD;  Location: [facility];  Service: Gastroenterology;;   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  Constitutional:      Appearance: He is well-developed.  HENT:     Head: Normocephalic and atraumatic.  Eyes:     Conjunctiva/sclera: Conjunctivae normal.     Pupils: Pupils are equal, round, and reactive to light.  Cardiovascular:     Rate and Rhythm: Normal rate and regular rhythm.     Heart sounds: Normal heart sounds.  Pulmonary:     Effort: Pulmonary effort is normal.     Breath sounds: Normal breath sounds.  Abdominal:     General: Bowel sounds are normal.     Palpations: Abdomen is soft.  Musculoskeletal:     Cervical back: Normal range of motion and neck supple.  Skin:    General: Skin is warm and dry.     Capillary Refill: Capillary refill takes less than 2 seconds.  Neurological:     Mental Status: He is alert and oriented to person, place, and time.  Psychiatric:        Behavior: Behavior normal.  Musculoskeletal Exam: Cervical spine was in good range of motion.  Thoracic and lumbar spine range of motion was difficult to assess due to body habitus.  Shoulders, elbows, wrist joints were in good range of motion.  There was no synovitis over MCPs or PIPs.  Hip joints were in good range of motion.  Knee joints were in good range of motion without any warmth swelling or effusion.  There was no tenderness over ankles or MTPs.  CDAI Exam: CDAI Score: -- Patient Global: --; Provider Global: -- Swollen: --; Tender: -- Joint Exam 08/02/2023   No joint exam has been documented for this visit   There is currently no information documented on the homunculus. Go to the Rheumatology activity and complete the homunculus joint exam.  Investigation: No additional findings.  Imaging: No results found.  Recent Labs: Lab Results  Component Value Date   WBC 5.6 07/10/2023   HGB 12.5 (L) 07/10/2023   PLT 230 07/10/2023   NA  137 07/10/2023   K 3.8 07/10/2023   CL 104 07/10/2023   CO2 24 07/10/2023   GLUCOSE 98 07/10/2023   BUN 14 07/10/2023   CREATININE 0.94 07/10/2023   BILITOT 0.2 07/10/2023   ALKPHOS 58 07/10/2023   AST 19 07/10/2023   ALT 8 07/10/2023   PROT 7.4 07/10/2023   ALBUMIN 3.8 07/10/2023   CALCIUM  9.3 07/10/2023   GFRAA 104 06/07/2020   QFTBGOLDPLUS NEGATIVE 03/14/2021    Speciality Comments: Start date :Humira  April 2022, methotrexate  January 2022  Procedures:  No procedures performed Allergies: Atorvastatin, Simvastatin, Other, and Statins   Assessment / Plan:     Visit Diagnoses: Rheumatoid arthritis with rheumatoid factor of multiple sites without organ or systems involvement (HCC) - Positive RF, positive anti-CCP, elevated sed rate, synovitis involving multiple joints: His disease has been in remission since he started chemotherapy and had a stem cell transplant.  He denies any increased risk of joint swelling.  He has been having intermittent discomfort in his knee joints.  High risk medication use - Holding Humira  and MTX-since May 2023 while under the care of Dr. Wilhelmenia Harada for management of multiple myeloma.  He had chemotherapy followed by stem cell transplant.  Arthritis of both acromioclavicular joints-good range of motion without discomfort today.  History of bilateral carpal tunnel release-he still has intermittent paresthesias.  Use of carpal tunnel braces was discussed.  Chronic pain of both knees -he gives history of some discomfort in his knee joints.  He states he has been more active and doing yard work.  He gives history of intermittent sharp pain in his right knee joint which last for few seconds about twice a week.  No warmth swelling or effusion was noted.  Plan: XR KNEE 3 VIEW RIGHT, XR KNEE 3 VIEW LEFT.  X-rays obtained of bilateral knee joints were unremarkable.  A handout on knee exercises was provided.  HNP (herniated nucleus pulposus) with myelopathy, cervical-status  post decompression, discectomy and fusion 2019.  He had good range of motion of the cervical spine.  Multiple myeloma not having achieved remission [facility]) - he is followed by Dr. Gerlene Koh had the stem cell transplant in November 2023.  He is on Zovirax  and Bactrim  prophylaxis.  He is on Revlimid  per Dr. Wilhelmenia Harada.  Morbid obesity-BMI 43.33.  Patient states he is more active and is trying to lose weight.  Orders: Orders Placed This Encounter  Procedures   XR KNEE 3 VIEW RIGHT   XR KNEE 3 VIEW LEFT  No orders of the defined types were placed in this encounter.    Follow-Up Instructions: Return in about 5 months (around 01/02/2024) for Rheumatoid arthritis.   Nicholas Bari, MD  Note - This record has been created using Animal nutritionist.  Chart creation errors have been sought, but may not always  have been located. Such creation errors do not reflect on  the standard of medical care.

## 2023-07-30 ENCOUNTER — Other Ambulatory Visit: Payer: Self-pay | Admitting: Oncology

## 2023-07-30 DIAGNOSIS — C9000 Multiple myeloma not having achieved remission: Secondary | ICD-10-CM

## 2023-08-02 ENCOUNTER — Ambulatory Visit

## 2023-08-02 ENCOUNTER — Telehealth: Payer: Self-pay | Admitting: *Deleted

## 2023-08-02 ENCOUNTER — Ambulatory Visit: Payer: Medicare Other | Attending: Rheumatology | Admitting: Rheumatology

## 2023-08-02 ENCOUNTER — Encounter: Payer: Self-pay | Admitting: Rheumatology

## 2023-08-02 VITALS — BP 120/83 | HR 92 | Resp 14 | Ht 68.0 in | Wt 285.0 lb

## 2023-08-02 DIAGNOSIS — Z79899 Other long term (current) drug therapy: Secondary | ICD-10-CM | POA: Diagnosis not present

## 2023-08-02 DIAGNOSIS — M0579 Rheumatoid arthritis with rheumatoid factor of multiple sites without organ or systems involvement: Secondary | ICD-10-CM

## 2023-08-02 DIAGNOSIS — M19012 Primary osteoarthritis, left shoulder: Secondary | ICD-10-CM | POA: Diagnosis not present

## 2023-08-02 DIAGNOSIS — M24521 Contracture, right elbow: Secondary | ICD-10-CM

## 2023-08-02 DIAGNOSIS — C9000 Multiple myeloma not having achieved remission: Secondary | ICD-10-CM | POA: Diagnosis not present

## 2023-08-02 DIAGNOSIS — M5000 Cervical disc disorder with myelopathy, unspecified cervical region: Secondary | ICD-10-CM | POA: Diagnosis not present

## 2023-08-02 DIAGNOSIS — M25562 Pain in left knee: Secondary | ICD-10-CM | POA: Diagnosis not present

## 2023-08-02 DIAGNOSIS — G8929 Other chronic pain: Secondary | ICD-10-CM

## 2023-08-02 DIAGNOSIS — M19011 Primary osteoarthritis, right shoulder: Secondary | ICD-10-CM

## 2023-08-02 DIAGNOSIS — M25561 Pain in right knee: Secondary | ICD-10-CM

## 2023-08-02 DIAGNOSIS — Z9889 Other specified postprocedural states: Secondary | ICD-10-CM

## 2023-08-02 DIAGNOSIS — K8020 Calculus of gallbladder without cholecystitis without obstruction: Secondary | ICD-10-CM

## 2023-08-02 NOTE — Telephone Encounter (Signed)
 Patient advised per Dr. Alvira Josephs bilateral knee x-rays are normal. Patient expressed understanding.

## 2023-08-02 NOTE — Patient Instructions (Signed)
 Exercises for Chronic Knee Pain  Chronic knee pain is pain that lasts longer than 3 months. For most people with chronic knee pain, exercise and weight loss is an important part of treatment. Your health care provider may want you to focus on:  Making the muscles that support your knee stronger. This can take pressure off your knee and reduce pain.  Preventing knee stiffness.  How far you can move your knee, keeping it there or making it farther.  Losing weight (if this applies) to take pressure off your knee, lower your risk for injury, and make it easier for you to exercise.  Your provider will help you make an exercise program that fits your needs and physical abilities. Below are simple, low-impact exercises you can do at home. Ask your provider or physical therapist how often you should do your exercise program and how many times to repeat each exercise.  General safety tips    Get your provider's approval before doing any exercises.  Start slowly and stop any time you feel pain.  Do not exercise if your knee pain is flaring up.  Warm up first. Stretching a cold muscle can cause an injury. Do 5-10 minutes of easy movement or light stretching before beginning your exercises.  Do 5-10 minutes of low-impact activity (like walking or cycling) before starting strengthening exercises.  Contact your provider any time you have pain during or after exercising. Exercise can cause discomfort but should not be painful. It is normal to be a little stiff or sore after exercising.  Stretching and range-of-motion exercises  Front thigh stretch    Stand up straight and support your body by holding on to a chair or resting one hand on a Mastandrea.  With your legs straight and close together, bend one knee to lift your heel up toward your butt.  Using one hand for support, grab your ankle with your free hand.  Pull your foot up closer toward your butt to feel the stretch in front of your thigh.  Hold the stretch for 30  seconds.  Repeat __________ times. Complete this exercise __________ times a day.  Back thigh stretch    Sit on the floor with your back straight and your legs out straight in front of you.  Place the palms of your hands on the floor and slide them toward your feet as you bend at the hip.  Try to touch your nose to your knees and feel the stretch in the back of your thighs.  Hold for 30 seconds.  Repeat __________ times. Complete this exercise __________ times a day.  Calf stretch    Stand facing a Sheehy.  Place the palms of your hands flat against the Ghazi, arms extended, and lean slightly against the Muha.  Get into a lunge position with one leg bent at the knee and the other leg stretched out straight behind you.  Keep both feet facing the Ziesmer and increase the bend in your knee while keeping the heel of the other leg flat on the ground.  You should feel the stretch in your calf. Hold for 30 seconds.  Repeat __________ times. Complete this exercise __________ times a day.  Strengthening exercises  Straight leg lift    Lie on your back with one knee bent and the other leg out straight.  Slowly lift the straight leg without bending the knee.  Lift until your foot is about 12 inches (30 cm) off the floor.  Hold for  3-5 seconds and slowly lower your leg.  Repeat __________ times. Complete this exercise __________ times a day.  Single leg dip    Stand between two chairs and put both hands on the backs of the chairs for support.  Extend one leg out straight with your body weight resting on the heel of the standing leg.  Slowly bend your standing knee to dip your body to the level that is comfortable for you.  Hold for 3-5 seconds.  Repeat __________ times. Complete this exercise __________ times a day.  Hamstring curls    Stand straight, knees close together, facing the back of a chair.  Hold on to the back of a chair with both hands.  Keep one leg straight. Bend the other knee while bringing the heel up toward the butt  until the knee is bent at a 90-degree angle (right angle).  Hold for 3-5 seconds.  Repeat __________ times. Complete this exercise __________ times a day.  Neece squat    Stand straight with your back, hips, and head against a Rushing.  Step forward one foot at a time with your back still against the Spargur.  Your feet should be 2 feet (61 cm) from the Jeanpaul at shoulder width. Keeping your back, hips, and head against the Colligan, slide down the Brenner to as close to a sitting position as you can get.  Hold for 5-10 seconds, then slowly slide back up.  Repeat __________ times. Complete this exercise __________ times a day.  Step-ups    Stand in front of a sturdy platform or stool that is about 6 inches (15 cm) high.  Slowly step up with your left / right foot, keeping your knee in line with your hip and foot. Do not let your knee bend so far that you cannot see your toes. Hold on to a chair for balance, but do not use it for support.  Slowly unlock your knee and lower yourself to the starting position.  Repeat __________ times. Complete this exercise __________ times a day.  Contact a health care provider if:  Your exercises cause pain.  Your pain is worse after you exercise.  Your pain prevents you from doing your exercises.  This information is not intended to replace advice given to you by your health care provider. Make sure you discuss any questions you have with your health care provider.  Document Revised: 04/04/2022 Document Reviewed: 04/04/2022  Elsevier Patient Education  2024 [employer].

## 2023-08-07 ENCOUNTER — Inpatient Hospital Stay: Admitting: Oncology

## 2023-08-07 ENCOUNTER — Inpatient Hospital Stay: Attending: Oncology

## 2023-08-07 ENCOUNTER — Encounter: Payer: Self-pay | Admitting: Oncology

## 2023-08-07 ENCOUNTER — Inpatient Hospital Stay

## 2023-08-07 VITALS — BP 133/71 | HR 75 | Temp 96.2°F | Resp 18 | Wt 285.9 lb

## 2023-08-07 DIAGNOSIS — C9000 Multiple myeloma not having achieved remission: Secondary | ICD-10-CM

## 2023-08-07 DIAGNOSIS — Z79624 Long term (current) use of inhibitors of nucleotide synthesis: Secondary | ICD-10-CM | POA: Diagnosis not present

## 2023-08-07 DIAGNOSIS — Z87891 Personal history of nicotine dependence: Secondary | ICD-10-CM | POA: Insufficient documentation

## 2023-08-07 DIAGNOSIS — Z7961 Long term (current) use of immunomodulator: Secondary | ICD-10-CM | POA: Insufficient documentation

## 2023-08-07 DIAGNOSIS — Z79899 Other long term (current) drug therapy: Secondary | ICD-10-CM | POA: Diagnosis not present

## 2023-08-07 DIAGNOSIS — Z9481 Bone marrow transplant status: Secondary | ICD-10-CM | POA: Insufficient documentation

## 2023-08-07 DIAGNOSIS — Z9484 Stem cells transplant status: Secondary | ICD-10-CM | POA: Diagnosis not present

## 2023-08-07 DIAGNOSIS — G629 Polyneuropathy, unspecified: Secondary | ICD-10-CM | POA: Insufficient documentation

## 2023-08-07 DIAGNOSIS — Z5111 Encounter for antineoplastic chemotherapy: Secondary | ICD-10-CM

## 2023-08-07 DIAGNOSIS — Z7982 Long term (current) use of aspirin: Secondary | ICD-10-CM | POA: Diagnosis not present

## 2023-08-07 LAB — CBC WITH DIFFERENTIAL (CANCER CENTER ONLY)
Abs Immature Granulocytes: 0.02 10*3/uL (ref 0.00–0.07)
Basophils Absolute: 0 10*3/uL (ref 0.0–0.1)
Basophils Relative: 1 %
Eosinophils Absolute: 0.1 10*3/uL (ref 0.0–0.5)
Eosinophils Relative: 2 %
HCT: 37.7 % — ABNORMAL LOW (ref 39.0–52.0)
Hemoglobin: 12.5 g/dL — ABNORMAL LOW (ref 13.0–17.0)
Immature Granulocytes: 0 %
Lymphocytes Relative: 27 %
Lymphs Abs: 1.4 10*3/uL (ref 0.7–4.0)
MCH: 31.9 pg (ref 26.0–34.0)
MCHC: 33.2 g/dL (ref 30.0–36.0)
MCV: 96.2 fL (ref 80.0–100.0)
Monocytes Absolute: 0.5 10*3/uL (ref 0.1–1.0)
Monocytes Relative: 10 %
Neutro Abs: 3.1 10*3/uL (ref 1.7–7.7)
Neutrophils Relative %: 60 %
Platelet Count: 218 10*3/uL (ref 150–400)
RBC: 3.92 MIL/uL — ABNORMAL LOW (ref 4.22–5.81)
RDW: 15.4 % (ref 11.5–15.5)
WBC Count: 5.1 10*3/uL (ref 4.0–10.5)
nRBC: 0 % (ref 0.0–0.2)

## 2023-08-07 LAB — CMP (CANCER CENTER ONLY)
ALT: 9 U/L (ref 0–44)
AST: 18 U/L (ref 15–41)
Albumin: 3.8 g/dL (ref 3.5–5.0)
Alkaline Phosphatase: 61 U/L (ref 38–126)
Anion gap: 9 (ref 5–15)
BUN: 14 mg/dL (ref 8–23)
CO2: 22 mmol/L (ref 22–32)
Calcium: 8.9 mg/dL (ref 8.9–10.3)
Chloride: 108 mmol/L (ref 98–111)
Creatinine: 0.98 mg/dL (ref 0.61–1.24)
GFR, Estimated: >60 mL/min
Glucose, Bld: 102 mg/dL — ABNORMAL HIGH (ref 70–99)
Potassium: 3.8 mmol/L (ref 3.5–5.1)
Sodium: 139 mmol/L (ref 135–145)
Total Bilirubin: 0.4 mg/dL (ref 0.0–1.2)
Total Protein: 7.2 g/dL (ref 6.5–8.1)

## 2023-08-07 MED ORDER — ZOLEDRONIC ACID 4 MG/100ML IV SOLN
4.0000 mg | Freq: Once | INTRAVENOUS | Status: AC
  Administered 2023-08-07: 4 mg via INTRAVENOUS
  Filled 2023-08-07: qty 100

## 2023-08-07 MED ORDER — SODIUM CHLORIDE 0.9 % IV SOLN
INTRAVENOUS | Status: DC
  Filled 2023-08-07: qty 250

## 2023-08-07 NOTE — Progress Notes (Signed)
 Hematology/Oncology Progress note [phone] [phone]      CHIEF COMPLAINTS/REASON FOR VISIT:  Follow-up for multiple myeloma   ASSESSMENT & PLAN:   Cancer Staging  Multiple myeloma not having achieved remission (HCC) Staging form: Plasma Cell Myeloma and Plasma Cell Disorders, AJCC 8th Edition - Clinical stage from 04/22/2020: RISS Stage II (Beta-2 -microglobulin (mg/L): 2.7, Albumin (g/dL): 3.1, ISS: Stage II, High-risk cytogenetics: Absent, LDH: Normal) - Signed by Timmy Forbes, MD on 08/10/2021   Multiple myeloma not having achieved remission (HCC) 0IgA lambda multiple myeloma, M protein 2.3, normal free light chain ratio Status post Dara Rvd, achieved CR, s/p autologous bone marrow transplant 02/15/2022 at [facility] 02/16/23  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  vaccinations per protocol per  [facility] are reviewed and discussed with patient. Light chain ratio gradually increases.  maintenance with revlimid  10 mg on days 1-21 of 28 day cycle - proceed with this cycle Revlimid  -  Zometa   - plan Q3 months to continue to 2 years of therapy [until Nov 2025], today, next due August 2025 Recommend calcium  with vitamin D supplementation.   Encounter for antineoplastic chemotherapy Chemotherapy plan as listed above.  Hypocalcemia Recommend increase calcium  to 600mg  TID Calcium  level has been stable.  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  Monitor symptoms-stable       Orders Placed This Encounter  Procedures   CBC with Differential (Cancer Center Only)    Standing Status:   Future    Expected Date:   09/04/2023    Expiration Date:   08/06/2024   CMP (Cancer Center only)    Standing Status:   Future    Expected Date:    09/04/2023    Expiration Date:   08/06/2024   Kappa/lambda light chains    Standing Status:   Future    Expected Date:   09/04/2023    Expiration Date:   08/06/2024   Multiple Myeloma Panel (SPEP&IFE w/QIG)    Standing Status:   Future    Expected Date:   09/04/2023    Expiration Date:   08/06/2024    Follow up in 4 weeks.  All questions were answered. The patient knows to call the clinic with any problems, questions or concerns.  Timmy Forbes, MD, PhD [facility] Hematology Oncology 08/07/2023    HISTORY OF PRESENTING ILLNESS:  Eric Cruz is a 73 y.o. male presents for follow up of myeloma .  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  27% plasma cell, cytogenetics - normal, and myeloma FISH panel is negative.  Standard risk.  Congo red staining was added and was negative.  - 07/27/2021, bone marrow biopsy showed hypercellular marrow involved by plasma cell myeloma, CD138 immunohistochemistry plasma cells compromise approximately 70% of the cellular elements and are lambda restricted by light chain in situ hybridization.  Cytogenetics showed duplication of 1q.  Cytogenetics is normal.   04/22/2020 Cancer Staging   Staging form: Plasma Cell Myeloma and Plasma Cell Disorders, AJCC 8th Edition - Clinical stage from 04/22/2020: RISS Stage II (Beta-2 -microglobulin (mg/L): 2.7, Albumin (g/dL): 3.1, ISS: Stage II, High-risk cytogenetics: Absent, LDH: Normal) - Signed  by Timmy Forbes, MD on 08/10/2021 Stage prefix: Initial diagnosis Beta 2 microglobulin range (mg/L): Less than 3.5 Albumin range (g/dL): Less than 3.5 Cytogenetics: 1q addition Lactate dehydrogenase (LDH) (U/L): 136 Serum calcium  level: Normal Serum creatinine level: Normal   08/12/2021 Imaging   PET scan showed 1. No evidence of active myeloma on  skull base to thigh FDG PET scan. 2. No soft tissue plasmacytoma.3. No lytic or suspicious lesion on CT portion exam.     08/22/2021 - 01/11/2022 Chemotherapy   Patient is on Treatment Plan : MYELOMA NEWLY DIAGNOSED TRANSPLANT CANDIDATE DaraVRd (Daratumumab  SQ) q21d x 6 Cycles (Induction/Consolidation)     08/24/2021 - 11/09/2021 Chemotherapy   Patient is on Treatment Plan : MYELOMA NEWLY DIAGNOSED TRANSPLANT CANDIDATE DaraVRd (Daratumumab  SQ) q21d x 6 Cycles (Induction/Consolidation)     11/02/2021 Imaging   Bilateral lower extremity US  negative for DVT   11/15/2021 Echocardiogram   1. Left ventricular ejection fraction, by estimation, is 60 to 65%. The left ventricle has normal function. The left ventricle has no regional wall motion abnormalities. Left ventricular diastolic parameters are consistent with Grade II diastolic dysfunction (pseudonormalization). The average left ventricular global longitudinal strain is -17.4 %.   2. Right ventricular systolic function is normal. The right ventricular size is normal. There is mildly elevated pulmonary artery systolic pressure. The estimated right ventricular systolic pressure is 37.7 mmHg.   3. The mitral valve is normal in structure. Mild mitral valve regurgitation. No evidence of mitral stenosis.  4. The aortic valve was not well visualized. Aortic valve regurgitation  is not visualized. No aortic stenosis is present.   5. The inferior vena cava is normal in size with greater than 50% respiratory variability, suggesting right atrial pressure of 3 mmHg.    01/23/2022 Bone Marrow Biopsy   Mildly hypercellular bone marrow (40%) with increased erythropoiesis and 2% plasma cells.    02/15/2022 Procedure   Status post autologous stem cell transplant at [facility] Preparative regimen with Melphalan 200 mg/m2 on 02/14/22 Infusion of stem cells - 6.7 x 10(6) on 02/15/22    06/13/2022 Procedure   Bone marrow biopsy at [facility] showed:  Mildly hypercellular bone marrow (40%) with trilineage hematopoiesis and no increase in plasma cells. MRD pending    06/30/2022 -  Chemotherapy   Started on maintenance Revlimid  10mg  D1-21 Q28 days.     For rheumatoid arthritis, patient was seen by Dr. Alvira Josephs recently and is currently off methotrexate  and Humira .  + Bilateral lower extremity swelling.US  negative for DVT, rade II diastolic dysfunction one Echo  INTERVAL HISTORY Eric Cruz is a 73 y.o. male who has above history reviewed by me today presents for follow up visit for management of multiple myeloma He reports feeling well.  He has been eating healthy, stable weight.   Review of Systems  Constitutional:  Negative for appetite change, chills, fatigue, fever and unexpected weight change.  HENT:   Negative for hearing loss and voice change.   Eyes:  Negative for eye problems and icterus.  Respiratory:  Negative for chest tightness, cough and shortness of breath.   Cardiovascular:  Negative for chest pain and leg swelling.  Gastrointestinal:  Negative for abdominal distention and abdominal pain.  Endocrine: Negative for hot flashes.  Genitourinary:  Negative for difficulty urinating, dysuria and frequency.   Musculoskeletal:  Positive for arthralgias and back pain. Negative for myalgias.  Skin:  Negative for itching and rash.  Neurological:  Negative for light-headedness and  numbness.  Hematological:  Negative for adenopathy. Does not bruise/bleed easily.  Psychiatric/Behavioral:  Negative for confusion.      MEDICAL HISTORY:  Past Medical History:  Diagnosis Date   Arthritis    BPH (benign prostatic hyperplasia)    Cancer (HCC)    Prostate: states he had a positive biopsy but had another one a year later and it was gone.    Depression    Dizziness    GERD (gastroesophageal reflux disease)    Gout    High cholesterol    Hypertension    Sleep apnea    has cpap - not currently wearing   Varicose veins      SURGICAL HISTORY: Past Surgical History:  Procedure Laterality Date   ANTERIOR CERVICAL DECOMP/DISCECTOMY FUSION N/A 02/21/2018   Procedure: Cervical Five-Six Cervical Six-Seven Anterior cervical decompression/discectomy/fusion;  Surgeon: Audie Bleacher, MD;  Location: [facility] OR;  Service: Neurosurgery;  Laterality: N/A;  Cervical Five-Six Cervical Six-Seven Anterior cervical decompression/discectomy/fusion   BIOPSY  10/09/2019   Procedure: BIOPSY;  Surgeon: Genell Ken, MD;  Location: [facility];  Service: Gastroenterology;;   BLADDER SURGERY     CARPAL TUNNEL RELEASE Right 02/11/2019   Procedure: Right Carpal Tunnel Wound Irrigation;  Surgeon: Audie Bleacher, MD;  Location[facility] OR;  Service: Neurosurgery;  Laterality: Right;  Right Carpal tunnel wound exploration/wash out   CARPAL TUNNEL RELEASE Bilateral    CHOLECYSTECTOMY  11/22/2012   CHOLECYSTECTOMY  11/22/2012   Procedure: LAPAROSCOPIC CHOLECYSTECTOMY;  Surgeon: Diantha Fossa, MD;  Location: [facility] OR;  Service: General;;   COLONOSCOPY     COLONOSCOPY WITH PROPOFOL  N/A 10/09/2019   Procedure: COLONOSCOPY WITH PROPOFOL ;  Surgeon: Genell Ken, MD;  Location: [facility];  Service: Gastroenterology;  Laterality: N/A;   ESOPHAGOGASTRODUODENOSCOPY (EGD) WITH PROPOFOL  N/A 10/09/2019   Procedure: ESOPHAGOGASTRODUODENOSCOPY (EGD) WITH PROPOFOL ;  Surgeon: Genell Ken, MD;  Location: [facility];  Service: Gastroenterology;  Laterality: N/A;   POLYPECTOMY  10/09/2019   Procedure: POLYPECTOMY;  Surgeon: Genell Ken, MD;  Location: [facility];  Service: Gastroenterology;;   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    SOCIAL HISTORY: Social History   Socioeconomic History   Marital status: Married    Spouse name: Not on file   Number of children: 2   Years of education: Not on file   Highest education level: Not on file  Occupational History   Occupation: Retired-mechanic  Tobacco Use   Smoking status: Former    Current packs/day: 0.00     Average packs/day: 1 pack/day for 31.0 years (31.0 ttl pk-yrs)    Types: Cigarettes    Start date: 04/13/1967    Quit date: 04/12/1998    Years since quitting: 25.3    Passive exposure: Never   Smokeless tobacco: Never  Vaping Use   Vaping status: Never Used  Substance and Sexual Activity   Alcohol use: No   Drug use: No   Sexual activity: Not on file  Other Topics Concern   Not on file  Social History Narrative   Not on file   Social Drivers of Health   Financial Resource Strain: Not on file  Food Insecurity: Not on file  Transportation Needs: Not on file  Physical Activity: Not on file  Stress: Not on file  Social Connections: Not on file  Intimate Partner Violence: Not on file    FAMILY HISTORY: Family History  Problem Relation Age of Onset   Hypertension Mother    Diabetes Mother    Multiple myeloma  Mother    Hypertension Father    Diabetes Father    Healthy Son    Healthy Daughter     ALLERGIES:  is allergic to atorvastatin, simvastatin, other, and statins.  MEDICATIONS:  Current Outpatient Medications  Medication Sig Dispense Refill   Ascorbic Acid (VITA-C PO) Take 2,000 tablets by mouth daily at 6 (six) AM.     aspirin  EC 81 MG tablet Take 81 mg by mouth daily. Swallow whole.     calcium  carbonate (CALCIUM  600) 600 MG TABS tablet Take 1 tablet (600 mg total) by mouth 3 (three) times daily with meals.     Cyanocobalamin  (B-12) 2500 MCG TABS Take by mouth daily at 6 (six) AM.     Ensure (ENSURE) Take 237 mLs by mouth daily at 6 (six) AM.     Evolocumab  (REPATHA  SURECLICK) 140 MG/ML SOAJ Inject 140 mg into the skin every 14 (fourteen) days. 2 mL 10   ferrous sulfate 325 (65 FE) MG EC tablet Take 325 mg by mouth daily at 6 (six) AM.     finasteride  (PROSCAR ) 5 MG tablet Take 5 mg by mouth daily.     furosemide  (LASIX ) 40 MG tablet TAKE 1 TABLET BY MOUTH DAILY 90 tablet 3   lenalidomide  (REVLIMID ) 10 MG capsule Take 1 capsule by mouth once daily for 21 days,  then 7 days off. 21 capsule 0   Multiple Vitamin (MULTIVITAMIN ADULT PO) Take by mouth.     tamsulosin  (FLOMAX ) 0.4 MG CAPS capsule Take 0.4 mg by mouth at bedtime.     Turmeric 500 MG CAPS Take by mouth daily at 6 (six) AM.     Vitamin D-Vitamin K (K2 PLUS D3 PO) Take 500 tablets by mouth daily at 6 (six) AM.     No current facility-administered medications for this visit.   Facility-Administered Medications Ordered in Other Visits  Medication Dose Route Frequency Provider Last Rate Last Admin   0.9 %  sodium chloride  infusion   Intravenous Continuous Timmy Forbes, MD   Stopped at 08/07/23 1444   acetaminophen  (TYLENOL ) 325 MG tablet            dexamethasone  (DECADRON ) 4 MG tablet            diphenhydrAMINE  (BENADRYL ) 25 mg capsule              PHYSICAL EXAMINATION: ECOG PERFORMANCE STATUS: 1 - Symptomatic but completely ambulatory Vitals:   08/07/23 1316 08/07/23 1327  BP: (!) 141/83 133/71  Pulse: 75   Resp: 18   Temp: (!) 96.2 F (35.7 C)   SpO2: 100%    Filed Weights   08/07/23 1316  Weight: 285 lb 14.4 oz (129.7 kg)    Physical Exam Constitutional:      General: He is not in acute distress.    Appearance: He is obese.     Comments: Patient walks with a cane.  HENT:     Head: Normocephalic and atraumatic.  Eyes:     General: No scleral icterus. Cardiovascular:     Rate and Rhythm: Normal rate and regular rhythm.     Heart sounds: Normal heart sounds.  Pulmonary:     Effort: Pulmonary effort is normal. No respiratory distress.     Breath sounds: No wheezing.  Abdominal:     General: Bowel sounds are normal. There is no distension.     Palpations: Abdomen is soft.  Musculoskeletal:        General: No deformity. Normal range  of motion.     Cervical back: Normal range of motion and neck supple.     Comments: Trace edema, bilateral low extremities.   Skin:    General: Skin is warm and dry.     Findings: No erythema or rash.  Neurological:     Mental Status: He  is alert and oriented to person, place, and time. Mental status is at baseline.     Cranial Nerves: No cranial nerve deficit.     Coordination: Coordination normal.  Psychiatric:        Mood and Affect: Mood normal.      LABORATORY DATA:  I have reviewed the data as listed    Latest Ref Rng & Units 08/07/2023    1:05 PM 07/10/2023    1:08 PM 06/12/2023    1:42 PM  CBC  WBC 4.0 - 10.5 K/uL 5.1  5.6  3.8   Hemoglobin 13.0 - 17.0 g/dL 16.1  09.6  04.5   Hematocrit 39.0 - 52.0 % 37.7  38.4  36.6   Platelets 150 - 400 K/uL 218  230  192       Latest Ref Rng & Units 08/07/2023    1:05 PM 07/10/2023    1:08 PM 07/06/2023    3:05 PM  CMP  Glucose 70 - 99 mg/dL 409  98    BUN 8 - 23 mg/dL 14  14    Creatinine 8.11 - 1.24 mg/dL 9.14  7.82    Sodium 956 - 145 mmol/L 139  137    Potassium 3.5 - 5.1 mmol/L 3.8  3.8    Chloride 98 - 111 mmol/L 108  104    CO2 22 - 32 mmol/L 22  24    Calcium  8.9 - 10.3 mg/dL 8.9  9.3    Total Protein 6.5 - 8.1 g/dL 7.2  7.4  6.8   Total Bilirubin 0.0 - 1.2 mg/dL 0.4  0.2  0.3   Alkaline Phos 38 - 126 U/L 61  58  78   AST 15 - 41 U/L 18  19  16    ALT 0 - 44 U/L 9  8  6       RADIOGRAPHIC STUDIES: I have personally reviewed the radiological images as listed and agreed with the findings in the report.  XR KNEE 3 VIEW LEFT Result Date: 08/02/2023 No medial or lateral compartment narrowing was noted.  No patellofemoral narrowing was noted.  No chondrocalcinosis was noted. Impression: Unremarkable x-rays of the knee.  XR KNEE 3 VIEW RIGHT Result Date: 08/02/2023 No medial or lateral compartment narrowing was noted.  No patellofemoral narrowing was noted.  No chondrocalcinosis was noted. Impression: Unremarkable x-rays of the knee.

## 2023-08-07 NOTE — Assessment & Plan Note (Addendum)
 Recommend increase calcium  to 600mg  TID Calcium  level has been stable.

## 2023-08-07 NOTE — Assessment & Plan Note (Signed)
 secondary to carpal tunnel disease and chemotherapy Patient did not tolerate gabapentin.  Monitor symptoms-stable

## 2023-08-07 NOTE — Assessment & Plan Note (Addendum)
 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  vaccinations per protocol per  [facility] are reviewed and discussed with patient. Light chain ratio gradually increases.  maintenance with revlimid  10 mg on days 1-21 of 28 day cycle - proceed with this cycle Revlimid  -  Zometa   - plan Q3 months to continue to 2 years of therapy [until Nov 2025], today, next due August 2025 Recommend calcium  with vitamin D supplementation.

## 2023-08-07 NOTE — Assessment & Plan Note (Signed)
 Chemotherapy plan as listed above

## 2023-08-08 LAB — KAPPA/LAMBDA LIGHT CHAINS
Kappa free light chain: 33 mg/L — ABNORMAL HIGH (ref 3.3–19.4)
Kappa, lambda light chain ratio: 1.73 — ABNORMAL HIGH (ref 0.26–1.65)
Lambda free light chains: 19.1 mg/L (ref 5.7–26.3)

## 2023-08-10 LAB — MULTIPLE MYELOMA PANEL, SERUM
Albumin SerPl Elph-Mcnc: 3.8 g/dL (ref 2.9–4.4)
Albumin/Glob SerPl: 1.3 (ref 0.7–1.7)
Alpha 1: 0.2 g/dL (ref 0.0–0.4)
Alpha2 Glob SerPl Elph-Mcnc: 0.6 g/dL (ref 0.4–1.0)
B-Globulin SerPl Elph-Mcnc: 1.1 g/dL (ref 0.7–1.3)
Gamma Glob SerPl Elph-Mcnc: 1.2 g/dL (ref 0.4–1.8)
Globulin, Total: 3 g/dL (ref 2.2–3.9)
IgA: 253 mg/dL (ref 61–437)
IgG (Immunoglobin G), Serum: 1306 mg/dL (ref 603–1613)
IgM (Immunoglobulin M), Srm: 42 mg/dL (ref 20–172)
Total Protein ELP: 6.8 g/dL (ref 6.0–8.5)

## 2023-08-20 DIAGNOSIS — Z23 Encounter for immunization: Secondary | ICD-10-CM | POA: Diagnosis not present

## 2023-08-20 DIAGNOSIS — Z9484 Stem cells transplant status: Secondary | ICD-10-CM | POA: Diagnosis not present

## 2023-08-25 ENCOUNTER — Other Ambulatory Visit: Payer: Self-pay | Admitting: Cardiovascular Disease

## 2023-08-29 ENCOUNTER — Other Ambulatory Visit: Payer: Self-pay | Admitting: Oncology

## 2023-08-29 DIAGNOSIS — C9000 Multiple myeloma not having achieved remission: Secondary | ICD-10-CM

## 2023-09-03 DIAGNOSIS — C9000 Multiple myeloma not having achieved remission: Secondary | ICD-10-CM | POA: Diagnosis not present

## 2023-09-03 DIAGNOSIS — D638 Anemia in other chronic diseases classified elsewhere: Secondary | ICD-10-CM | POA: Diagnosis not present

## 2023-09-03 DIAGNOSIS — Z0000 Encounter for general adult medical examination without abnormal findings: Secondary | ICD-10-CM | POA: Diagnosis not present

## 2023-09-03 DIAGNOSIS — I10 Essential (primary) hypertension: Secondary | ICD-10-CM | POA: Diagnosis not present

## 2023-09-03 DIAGNOSIS — E7800 Pure hypercholesterolemia, unspecified: Secondary | ICD-10-CM | POA: Diagnosis not present

## 2023-09-03 DIAGNOSIS — M0579 Rheumatoid arthritis with rheumatoid factor of multiple sites without organ or systems involvement: Secondary | ICD-10-CM | POA: Diagnosis not present

## 2023-09-03 DIAGNOSIS — G620 Drug-induced polyneuropathy: Secondary | ICD-10-CM | POA: Diagnosis not present

## 2023-09-03 DIAGNOSIS — I5032 Chronic diastolic (congestive) heart failure: Secondary | ICD-10-CM | POA: Diagnosis not present

## 2023-09-06 ENCOUNTER — Encounter: Payer: Self-pay | Admitting: Oncology

## 2023-09-06 ENCOUNTER — Inpatient Hospital Stay: Attending: Oncology

## 2023-09-06 ENCOUNTER — Inpatient Hospital Stay: Admitting: Oncology

## 2023-09-06 VITALS — BP 125/68 | HR 76 | Temp 97.2°F | Resp 18 | Ht 68.0 in | Wt 286.9 lb

## 2023-09-06 DIAGNOSIS — Z79899 Other long term (current) drug therapy: Secondary | ICD-10-CM | POA: Diagnosis not present

## 2023-09-06 DIAGNOSIS — Z5111 Encounter for antineoplastic chemotherapy: Secondary | ICD-10-CM

## 2023-09-06 DIAGNOSIS — C9000 Multiple myeloma not having achieved remission: Secondary | ICD-10-CM | POA: Diagnosis not present

## 2023-09-06 DIAGNOSIS — G629 Polyneuropathy, unspecified: Secondary | ICD-10-CM

## 2023-09-06 DIAGNOSIS — Z7961 Long term (current) use of immunomodulator: Secondary | ICD-10-CM | POA: Insufficient documentation

## 2023-09-06 DIAGNOSIS — Z87891 Personal history of nicotine dependence: Secondary | ICD-10-CM | POA: Insufficient documentation

## 2023-09-06 DIAGNOSIS — Z79624 Long term (current) use of inhibitors of nucleotide synthesis: Secondary | ICD-10-CM | POA: Insufficient documentation

## 2023-09-06 DIAGNOSIS — Z7982 Long term (current) use of aspirin: Secondary | ICD-10-CM | POA: Diagnosis not present

## 2023-09-06 LAB — CMP (CANCER CENTER ONLY)
ALT: 9 U/L (ref 0–44)
AST: 17 U/L (ref 15–41)
Albumin: 3.8 g/dL (ref 3.5–5.0)
Alkaline Phosphatase: 60 U/L (ref 38–126)
Anion gap: 7 (ref 5–15)
BUN: 19 mg/dL (ref 8–23)
CO2: 23 mmol/L (ref 22–32)
Calcium: 8.8 mg/dL — ABNORMAL LOW (ref 8.9–10.3)
Chloride: 106 mmol/L (ref 98–111)
Creatinine: 0.94 mg/dL (ref 0.61–1.24)
GFR, Estimated: >60 mL/min (ref >60)
Glucose, Bld: 98 mg/dL (ref 70–99)
Potassium: 3.9 mmol/L (ref 3.5–5.1)
Sodium: 136 mmol/L (ref 135–145)
Total Bilirubin: 0.5 mg/dL (ref 0.0–1.2)
Total Protein: 7.3 g/dL (ref 6.5–8.1)

## 2023-09-06 LAB — CBC WITH DIFFERENTIAL (CANCER CENTER ONLY)
Abs Immature Granulocytes: 0.02 10*3/uL (ref 0.00–0.07)
Basophils Absolute: 0.1 10*3/uL (ref 0.0–0.1)
Basophils Relative: 1 %
Eosinophils Absolute: 0.1 10*3/uL (ref 0.0–0.5)
Eosinophils Relative: 2 %
HCT: 36.2 % — ABNORMAL LOW (ref 39.0–52.0)
Hemoglobin: 12 g/dL — ABNORMAL LOW (ref 13.0–17.0)
Immature Granulocytes: 0 %
Lymphocytes Relative: 23 %
Lymphs Abs: 1.2 10*3/uL (ref 0.7–4.0)
MCH: 32.3 pg (ref 26.0–34.0)
MCHC: 33.1 g/dL (ref 30.0–36.0)
MCV: 97.3 fL (ref 80.0–100.0)
Monocytes Absolute: 0.8 10*3/uL (ref 0.1–1.0)
Monocytes Relative: 15 %
Neutro Abs: 3 10*3/uL (ref 1.7–7.7)
Neutrophils Relative %: 59 %
Platelet Count: 273 10*3/uL (ref 150–400)
RBC: 3.72 MIL/uL — ABNORMAL LOW (ref 4.22–5.81)
RDW: 15.2 % (ref 11.5–15.5)
WBC Count: 5.1 10*3/uL (ref 4.0–10.5)
nRBC: 0 % (ref 0.0–0.2)

## 2023-09-06 NOTE — Assessment & Plan Note (Signed)
 Recommend increase calcium  to 600mg  TID Calcium  level has been stable.

## 2023-09-06 NOTE — Progress Notes (Signed)
 Patient is doing well, no new questions or concerns for the doctor today.

## 2023-09-06 NOTE — Progress Notes (Signed)
 Hematology/Oncology Progress note [phone] [phone]      CHIEF COMPLAINTS/REASON FOR VISIT:  Follow-up for multiple myeloma   ASSESSMENT & PLAN:   Cancer Staging  Multiple myeloma not having achieved remission (HCC) Staging form: Plasma Cell Myeloma and Plasma Cell Disorders, AJCC 8th Edition - Clinical stage from 04/22/2020: RISS Stage II (Beta-2 -microglobulin (mg/L): 2.7, Albumin (g/dL): 3.1, ISS: Stage II, High-risk cytogenetics: Absent, LDH: Normal) - Signed by Timmy Forbes, MD on 08/10/2021   Multiple myeloma not having achieved remission (HCC) 0IgA lambda multiple myeloma, M protein 2.3, normal free light chain ratio Status post Dara Rvd, achieved CR, 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  vaccinations per protocol per  [facility] are reviewed and discussed with patient. Light chain ratio gradually increases.  maintenance with revlimid  10 mg on days 1-21 of 28 day cycle - proceed with this cycle Revlimid  -  Zometa   - plan Q3 months to continue to 2 years of therapy [until Nov 2025],  next due August 2025 Recommend calcium  with vitamin D supplementation.   Encounter for antineoplastic chemotherapy Chemotherapy plan as listed above.  Hypocalcemia Recommend increase calcium  to 600mg  TID Calcium  level has been stable.  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  Monitor symptoms-stable       Orders Placed This Encounter  Procedures   CMP (Cancer Center only)    Standing Status:   Future    Expected Date:   10/04/2023    Expiration Date:   09/05/2024   CBC with Differential (Cancer Center Only)    Standing Status:   Future    Expected Date:   10/04/2023     Expiration Date:   09/05/2024   Kappa/lambda light chains    Standing Status:   Future    Expected Date:   10/04/2023    Expiration Date:   09/05/2024   Multiple Myeloma Panel (SPEP&IFE w/QIG)    Standing Status:   Future    Expected Date:   10/04/2023    Expiration Date:   09/05/2024    Follow up in 4 weeks.  All questions were answered. The patient knows to call the clinic with any problems, questions or concerns.  Timmy Forbes, MD, PhD [facility] Hematology Oncology 09/06/2023    HISTORY OF PRESENTING ILLNESS:  Eric Cruz is a 73 y.o. male presents for follow up of myeloma .  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  27% plasma cell, cytogenetics - normal, and myeloma FISH panel is negative.  Standard risk.  Congo red staining was added and was negative.  - 07/27/2021, bone marrow biopsy showed hypercellular marrow involved by plasma cell myeloma, CD138 immunohistochemistry plasma cells compromise approximately 70% of the cellular elements and are lambda restricted by light chain in situ hybridization.  Cytogenetics showed duplication of 1q.  Cytogenetics is normal.   04/22/2020 Cancer Staging   Staging form: Plasma Cell Myeloma and Plasma Cell Disorders, AJCC 8th Edition - Clinical stage from 04/22/2020: RISS Stage II (Beta-2 -microglobulin (mg/L): 2.7, Albumin (g/dL): 3.1, ISS: Stage II, High-risk cytogenetics: Absent, LDH: Normal) - Signed  by Timmy Forbes, MD on 08/10/2021 Stage prefix: Initial diagnosis Beta 2 microglobulin range (mg/L): Less than 3.5 Albumin range (g/dL): Less than 3.5 Cytogenetics: 1q addition Lactate dehydrogenase (LDH) (U/L): 136 Serum calcium  level: Normal Serum creatinine level: Normal   08/12/2021 Imaging   PET scan showed 1. No evidence of active myeloma on skull base  to thigh FDG PET scan. 2. No soft tissue plasmacytoma.3. No lytic or suspicious lesion on CT portion exam.     08/22/2021 - 01/11/2022 Chemotherapy   Patient is on Treatment Plan : MYELOMA NEWLY DIAGNOSED TRANSPLANT CANDIDATE DaraVRd (Daratumumab  SQ) q21d x 6 Cycles (Induction/Consolidation)     08/24/2021 - 11/09/2021 Chemotherapy   Patient is on Treatment Plan : MYELOMA NEWLY DIAGNOSED TRANSPLANT CANDIDATE DaraVRd (Daratumumab  SQ) q21d x 6 Cycles (Induction/Consolidation)     11/02/2021 Imaging   Bilateral lower extremity US  negative for DVT   11/15/2021 Echocardiogram   1. Left ventricular ejection fraction, by estimation, is 60 to 65%. The left ventricle has normal function. The left ventricle has no regional wall motion abnormalities. Left ventricular diastolic parameters are consistent with Grade II diastolic dysfunction (pseudonormalization). The average left ventricular global longitudinal strain is -17.4 %.   2. Right ventricular systolic function is normal. The right ventricular size is normal. There is mildly elevated pulmonary artery systolic pressure. The estimated right ventricular systolic pressure is 37.7 mmHg.   3. The mitral valve is normal in structure. Mild mitral valve regurgitation. No evidence of mitral stenosis.  4. The aortic valve was not well visualized. Aortic valve regurgitation  is not visualized. No aortic stenosis is present.   5. The inferior vena cava is normal in size with greater than 50% respiratory variability, suggesting right atrial pressure of 3 mmHg.    01/23/2022 Bone Marrow Biopsy   Mildly hypercellular bone marrow (40%) with increased erythropoiesis and 2% plasma cells.    02/15/2022 Procedure   Status post autologous stem cell transplant at [facility] Preparative regimen with Melphalan 200 mg/m2 on 02/14/22 Infusion of stem cells - 6.7 x 10(6) on 02/15/22    06/13/2022 Procedure   Bone marrow biopsy at [facility] showed: Mildly  hypercellular bone marrow (40%) with trilineage hematopoiesis and no increase in plasma cells. MRD pending    06/30/2022 -  Chemotherapy   Started on maintenance Revlimid  10mg  D1-21 Q28 days.     For rheumatoid arthritis, patient was seen by Dr. Alvira Josephs recently and is currently off methotrexate  and Humira .  + Bilateral lower extremity swelling.US  negative for DVT, rade II diastolic dysfunction one Echo  INTERVAL HISTORY Eric Cruz is a 73 y.o. male who has above history reviewed by me today presents for follow up visit for management of multiple myeloma He reports feeling well.  He has been eating healthy, stable weight.   Review of Systems  Constitutional:  Negative for appetite change, chills, fatigue, fever and unexpected weight change.  HENT:   Negative for hearing loss and voice change.   Eyes:  Negative for eye problems and icterus.  Respiratory:  Negative for chest tightness, cough and shortness of breath.   Cardiovascular:  Negative for chest pain and leg swelling.  Gastrointestinal:  Negative for abdominal distention and abdominal pain.  Endocrine: Negative for hot flashes.  Genitourinary:  Negative for difficulty urinating, dysuria and frequency.   Musculoskeletal:  Positive for arthralgias and back pain. Negative for myalgias.  Skin:  Negative for itching and rash.  Neurological:  Negative for light-headedness and  numbness.  Hematological:  Negative for adenopathy. Does not bruise/bleed easily.  Psychiatric/Behavioral:  Negative for confusion.      MEDICAL HISTORY:  Past Medical History:  Diagnosis Date   Arthritis    BPH (benign prostatic hyperplasia)    Cancer (HCC)    Prostate: states he had a positive biopsy but had another one a year later and it was gone.    Depression    Dizziness    GERD (gastroesophageal reflux disease)    Gout    High cholesterol    Hypertension    Sleep apnea    has cpap - not currently wearing   Varicose veins      SURGICAL HISTORY: Past Surgical History:  Procedure Laterality Date   ANTERIOR CERVICAL DECOMP/DISCECTOMY FUSION N/A 02/21/2018   Procedure: Cervical Five-Six Cervical Six-Seven Anterior cervical decompression/discectomy/fusion;  Surgeon: Audie Bleacher, MD;  Location: [facility] OR;  Service: Neurosurgery;  Laterality: N/A;  Cervical Five-Six Cervical Six-Seven Anterior cervical decompression/discectomy/fusion   BIOPSY  10/09/2019   Procedure: BIOPSY;  Surgeon: Genell Ken, MD;  Location: [facility];  Service: Gastroenterology;;   BLADDER SURGERY     CARPAL TUNNEL RELEASE Right 02/11/2019   Procedure: Right Carpal Tunnel Wound Irrigation;  Surgeon: Audie Bleacher, MD;  Location: [facility] OR;  Service: Neurosurgery;  Laterality: Right;  Right Carpal tunnel wound exploration/wash out   CARPAL TUNNEL RELEASE Bilateral    CHOLECYSTECTOMY  11/22/2012   CHOLECYSTECTOMY  11/22/2012   Procedure: LAPAROSCOPIC CHOLECYSTECTOMY;  Surgeon: Diantha Fossa, MD;  Location: [facility] OR;  Service: General;;   COLONOSCOPY     COLONOSCOPY WITH PROPOFOL  N/A 10/09/2019   Procedure: COLONOSCOPY WITH PROPOFOL ;  Surgeon: Genell Ken, MD;  Location: [facility];  Service: Gastroenterology;  Laterality: N/A;   ESOPHAGOGASTRODUODENOSCOPY (EGD) WITH PROPOFOL  N/A 10/09/2019   Procedure: ESOPHAGOGASTRODUODENOSCOPY (EGD) WITH PROPOFOL ;  Surgeon: Genell Ken, MD;  Location: [facility];  Service: Gastroenterology;  Laterality: N/A;   POLYPECTOMY  10/09/2019   Procedure: POLYPECTOMY;  Surgeon: Genell Ken, MD;  Location: [facility];  Service: Gastroenterology;;   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    SOCIAL HISTORY: Social History   Socioeconomic History   Marital status: Married    Spouse name: Not on file   Number of children: 2   Years of education: Not on file   Highest education level: Not on file  Occupational History   Occupation: Retired-mechanic  Tobacco Use   Smoking status: Former    Current packs/day: 0.00     Average packs/day: 1 pack/day for 31.0 years (31.0 ttl pk-yrs)    Types: Cigarettes    Start date: 04/13/1967    Quit date: 04/12/1998    Years since quitting: 25.4    Passive exposure: Never   Smokeless tobacco: Never  Vaping Use   Vaping status: Never Used  Substance and Sexual Activity   Alcohol use: No   Drug use: No   Sexual activity: Not on file  Other Topics Concern   Not on file  Social History Narrative   Not on file   Social Drivers of Health   Financial Resource Strain: Not on file  Food Insecurity: Not on file  Transportation Needs: Not on file  Physical Activity: Not on file  Stress: Not on file  Social Connections: Not on file  Intimate Partner Violence: Not on file    FAMILY HISTORY: Family History  Problem Relation Age of Onset   Hypertension Mother    Diabetes Mother    Multiple myeloma  Mother    Hypertension Father    Diabetes Father    Healthy Son    Healthy Daughter     ALLERGIES:  is allergic to atorvastatin, simvastatin, other, and statins.  MEDICATIONS:  Current Outpatient Medications  Medication Sig Dispense Refill   Ascorbic Acid (VITA-C PO) Take 2,000 tablets by mouth daily at 6 (six) AM.     aspirin  EC 81 MG tablet Take 81 mg by mouth daily. Swallow whole.     calcium  carbonate (CALCIUM  600) 600 MG TABS tablet Take 1 tablet (600 mg total) by mouth 3 (three) times daily with meals.     Cyanocobalamin  (B-12) 2500 MCG TABS Take by mouth daily at 6 (six) AM.     Ensure (ENSURE) Take 237 mLs by mouth daily at 6 (six) AM.     Evolocumab  (REPATHA  SURECLICK) 140 MG/ML SOAJ Inject 140 mg into the skin every 14 (fourteen) days. 2 mL 10   ferrous sulfate 325 (65 FE) MG EC tablet Take 325 mg by mouth daily at 6 (six) AM.     finasteride  (PROSCAR ) 5 MG tablet Take 5 mg by mouth daily.     furosemide  (LASIX ) 40 MG tablet TAKE 1 TABLET BY MOUTH DAILY 90 tablet 2   lenalidomide  (REVLIMID ) 10 MG capsule Take 1 capsule by mouth once daily for 21 days,  then 7 days off. 21 capsule 0   Multiple Vitamin (MULTIVITAMIN ADULT PO) Take by mouth.     tamsulosin  (FLOMAX ) 0.4 MG CAPS capsule Take 0.4 mg by mouth at bedtime.     Turmeric 500 MG CAPS Take by mouth daily at 6 (six) AM.     Vitamin D-Vitamin K (K2 PLUS D3 PO) Take 500 tablets by mouth daily at 6 (six) AM.     No current facility-administered medications for this visit.   Facility-Administered Medications Ordered in Other Visits  Medication Dose Route Frequency Provider Last Rate Last Admin   acetaminophen  (TYLENOL ) 325 MG tablet            dexamethasone  (DECADRON ) 4 MG tablet            diphenhydrAMINE  (BENADRYL ) 25 mg capsule              PHYSICAL EXAMINATION: ECOG PERFORMANCE STATUS: 1 - Symptomatic but completely ambulatory Vitals:   09/06/23 1123  BP: 125/68  Pulse: 76  Resp: 18  Temp: (!) 97.2 F (36.2 C)  SpO2: 96%   Filed Weights   09/06/23 1123  Weight: 286 lb 14.4 oz (130.1 kg)    Physical Exam Constitutional:      General: He is not in acute distress.    Appearance: He is obese.     Comments: Patient walks with a cane.  HENT:     Head: Normocephalic and atraumatic.  Eyes:     General: No scleral icterus. Cardiovascular:     Rate and Rhythm: Normal rate and regular rhythm.     Heart sounds: Normal heart sounds.  Pulmonary:     Effort: Pulmonary effort is normal. No respiratory distress.     Breath sounds: No wheezing.  Abdominal:     General: Bowel sounds are normal. There is no distension.     Palpations: Abdomen is soft.  Musculoskeletal:        General: No deformity. Normal range of motion.     Cervical back: Normal range of motion and neck supple.     Comments: Trace edema, bilateral low extremities.   Skin:  General: Skin is warm and dry.     Findings: No erythema or rash.  Neurological:     Mental Status: He is alert and oriented to person, place, and time. Mental status is at baseline.     Cranial Nerves: No cranial nerve deficit.      Coordination: Coordination normal.  Psychiatric:        Mood and Affect: Mood normal.      LABORATORY DATA:  I have reviewed the data as listed    Latest Ref Rng & Units 09/06/2023   10:40 AM 08/07/2023    1:05 PM 07/10/2023    1:08 PM  CBC  WBC 4.0 - 10.5 K/uL 5.1  5.1  5.6   Hemoglobin 13.0 - 17.0 g/dL 40.9  81.1  91.4   Hematocrit 39.0 - 52.0 % 36.2  37.7  38.4   Platelets 150 - 400 K/uL 273  218  230       Latest Ref Rng & Units 09/06/2023   10:40 AM 08/07/2023    1:05 PM 07/10/2023    1:08 PM  CMP  Glucose 70 - 99 mg/dL 98  782  98   BUN 8 - 23 mg/dL 19  14  14    Creatinine 0.61 - 1.24 mg/dL 9.56  2.13  0.86   Sodium 135 - 145 mmol/L 136  139  137   Potassium 3.5 - 5.1 mmol/L 3.9  3.8  3.8   Chloride 98 - 111 mmol/L 106  108  104   CO2 22 - 32 mmol/L 23  22  24    Calcium  8.9 - 10.3 mg/dL 8.8  8.9  9.3   Total Protein 6.5 - 8.1 g/dL 7.3  7.2  7.4   Total Bilirubin 0.0 - 1.2 mg/dL 0.5  0.4  0.2   Alkaline Phos 38 - 126 U/L 60  61  58   AST 15 - 41 U/L 17  18  19    ALT 0 - 44 U/L 9  9  8       RADIOGRAPHIC STUDIES: I have personally reviewed the radiological images as listed and agreed with the findings in the report.  XR KNEE 3 VIEW LEFT Result Date: 08/02/2023 No medial or lateral compartment narrowing was noted.  No patellofemoral narrowing was noted.  No chondrocalcinosis was noted. Impression: Unremarkable x-rays of the knee.  XR KNEE 3 VIEW RIGHT Result Date: 08/02/2023 No medial or lateral compartment narrowing was noted.  No patellofemoral narrowing was noted.  No chondrocalcinosis was noted. Impression: Unremarkable x-rays of the knee.

## 2023-09-06 NOTE — Assessment & Plan Note (Signed)
 secondary to carpal tunnel disease and chemotherapy Patient did not tolerate gabapentin.  Monitor symptoms-stable

## 2023-09-06 NOTE — Assessment & Plan Note (Signed)
 Chemotherapy plan as listed above

## 2023-09-06 NOTE — Assessment & Plan Note (Addendum)
 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  vaccinations per protocol per  [facility] are reviewed and discussed with patient. Light chain ratio gradually increases.  maintenance with revlimid  10 mg on days 1-21 of 28 day cycle - proceed with this cycle Revlimid  -  Zometa   - plan Q3 months to continue to 2 years of therapy [until Nov 2025],  next due August 2025 Recommend calcium  with vitamin D supplementation.

## 2023-09-10 LAB — MULTIPLE MYELOMA PANEL, SERUM
Albumin SerPl Elph-Mcnc: 3.6 g/dL (ref 2.9–4.4)
Albumin/Glob SerPl: 1.1 (ref 0.7–1.7)
Alpha 1: 0.3 g/dL (ref 0.0–0.4)
Alpha2 Glob SerPl Elph-Mcnc: 0.7 g/dL (ref 0.4–1.0)
B-Globulin SerPl Elph-Mcnc: 1.1 g/dL (ref 0.7–1.3)
Gamma Glob SerPl Elph-Mcnc: 1.2 g/dL (ref 0.4–1.8)
Globulin, Total: 3.3 g/dL (ref 2.2–3.9)
IgA: 231 mg/dL (ref 61–437)
IgG (Immunoglobin G), Serum: 1319 mg/dL (ref 603–1613)
IgM (Immunoglobulin M), Srm: 45 mg/dL (ref 20–172)
Total Protein ELP: 6.9 g/dL (ref 6.0–8.5)

## 2023-09-10 LAB — KAPPA/LAMBDA LIGHT CHAINS
Kappa free light chain: 31.8 mg/L — ABNORMAL HIGH (ref 3.3–19.4)
Kappa, lambda light chain ratio: 1.61 (ref 0.26–1.65)
Lambda free light chains: 19.7 mg/L (ref 5.7–26.3)

## 2023-09-24 ENCOUNTER — Other Ambulatory Visit: Payer: Self-pay

## 2023-09-24 ENCOUNTER — Telehealth: Payer: Self-pay

## 2023-09-24 DIAGNOSIS — C9000 Multiple myeloma not having achieved remission: Secondary | ICD-10-CM

## 2023-09-24 MED ORDER — LENALIDOMIDE 10 MG PO CAPS
ORAL_CAPSULE | ORAL | 0 refills | Status: DC
Start: 2023-09-24 — End: 2023-10-24

## 2023-09-24 MED ORDER — LENALIDOMIDE 10 MG PO CAPS: ORAL_CAPSULE | ORAL | 0 refills | Status: DC

## 2023-09-24 NOTE — Telephone Encounter (Signed)
 [facility] pharmacy rep called requesting Revlimid  Rx to be sent via fax [phone] or per phone [phone]

## 2023-09-24 NOTE — Telephone Encounter (Signed)
 Refill sent via electronic and fax.

## 2023-09-26 ENCOUNTER — Ambulatory Visit: Admitting: Podiatry

## 2023-09-26 DIAGNOSIS — B351 Tinea unguium: Secondary | ICD-10-CM

## 2023-09-26 DIAGNOSIS — M79672 Pain in left foot: Secondary | ICD-10-CM

## 2023-09-26 DIAGNOSIS — M79671 Pain in right foot: Secondary | ICD-10-CM

## 2023-09-26 NOTE — Progress Notes (Signed)
 Patient presents for evaluation and treatment of tenderness and some redness around nails feet.  Tenderness around toes with walking and wearing shoes.  Physical exam:  General appearance: Alert, pleasant, and in no acute distress.  Vascular: Pedal pulses: DP 2/4 B/L, PT 0/4 B/L.  Moderate edema lower legs bilaterally  Neurological:    Dermatologic:  Nails thickened, disfigured, discolored 1-5 BL with subungual debris.  Redness and hypertrophic nail folds along nail folds bilaterally but no signs of drainage or infection.  Musculoskeletal:     Diagnosis: 1. Painful onychomycotic nails 1 through 5 bilaterally. 2. Pain toes 1 through 5 bilaterally.  Plan: Debrided onychomycotic nails 1 through 5 bilaterally.  Return 3 months

## 2023-10-04 ENCOUNTER — Encounter: Payer: Self-pay | Admitting: Oncology

## 2023-10-04 ENCOUNTER — Inpatient Hospital Stay: Attending: Oncology

## 2023-10-04 ENCOUNTER — Inpatient Hospital Stay: Admitting: Oncology

## 2023-10-04 VITALS — BP 114/64 | HR 62 | Temp 97.3°F | Resp 18 | Wt 286.1 lb

## 2023-10-04 DIAGNOSIS — C9000 Multiple myeloma not having achieved remission: Secondary | ICD-10-CM | POA: Diagnosis not present

## 2023-10-04 DIAGNOSIS — Z9484 Stem cells transplant status: Secondary | ICD-10-CM | POA: Insufficient documentation

## 2023-10-04 DIAGNOSIS — Z79624 Long term (current) use of inhibitors of nucleotide synthesis: Secondary | ICD-10-CM | POA: Diagnosis not present

## 2023-10-04 DIAGNOSIS — Z87891 Personal history of nicotine dependence: Secondary | ICD-10-CM | POA: Diagnosis not present

## 2023-10-04 DIAGNOSIS — Z7982 Long term (current) use of aspirin: Secondary | ICD-10-CM | POA: Diagnosis not present

## 2023-10-04 DIAGNOSIS — Z5111 Encounter for antineoplastic chemotherapy: Secondary | ICD-10-CM | POA: Diagnosis not present

## 2023-10-04 DIAGNOSIS — Z7961 Long term (current) use of immunomodulator: Secondary | ICD-10-CM | POA: Diagnosis not present

## 2023-10-04 DIAGNOSIS — G629 Polyneuropathy, unspecified: Secondary | ICD-10-CM

## 2023-10-04 DIAGNOSIS — Z9481 Bone marrow transplant status: Secondary | ICD-10-CM | POA: Diagnosis not present

## 2023-10-04 DIAGNOSIS — Z79899 Other long term (current) drug therapy: Secondary | ICD-10-CM | POA: Insufficient documentation

## 2023-10-04 LAB — CBC WITH DIFFERENTIAL (CANCER CENTER ONLY)
Abs Immature Granulocytes: 0.02 10*3/uL (ref 0.00–0.07)
Basophils Absolute: 0.1 10*3/uL (ref 0.0–0.1)
Basophils Relative: 1 %
Eosinophils Absolute: 0.1 10*3/uL (ref 0.0–0.5)
Eosinophils Relative: 2 %
HCT: 37 % — ABNORMAL LOW (ref 39.0–52.0)
Hemoglobin: 12.2 g/dL — ABNORMAL LOW (ref 13.0–17.0)
Immature Granulocytes: 0 %
Lymphocytes Relative: 22 %
Lymphs Abs: 1.1 10*3/uL (ref 0.7–4.0)
MCH: 31.8 pg (ref 26.0–34.0)
MCHC: 33 g/dL (ref 30.0–36.0)
MCV: 96.4 fL (ref 80.0–100.0)
Monocytes Absolute: 0.6 10*3/uL (ref 0.1–1.0)
Monocytes Relative: 13 %
Neutro Abs: 3 10*3/uL (ref 1.7–7.7)
Neutrophils Relative %: 62 %
Platelet Count: 227 10*3/uL (ref 150–400)
RBC: 3.84 MIL/uL — ABNORMAL LOW (ref 4.22–5.81)
RDW: 15.1 % (ref 11.5–15.5)
WBC Count: 4.9 10*3/uL (ref 4.0–10.5)
nRBC: 0 % (ref 0.0–0.2)

## 2023-10-04 LAB — CMP (CANCER CENTER ONLY)
ALT: 7 U/L (ref 0–44)
AST: 19 U/L (ref 15–41)
Albumin: 3.8 g/dL (ref 3.5–5.0)
Alkaline Phosphatase: 63 U/L (ref 38–126)
Anion gap: 4 — ABNORMAL LOW (ref 5–15)
BUN: 17 mg/dL (ref 8–23)
CO2: 27 mmol/L (ref 22–32)
Calcium: 8.9 mg/dL (ref 8.9–10.3)
Chloride: 105 mmol/L (ref 98–111)
Creatinine: 0.99 mg/dL (ref 0.61–1.24)
GFR, Estimated: >60 mL/min (ref >60)
Glucose, Bld: 109 mg/dL — ABNORMAL HIGH (ref 70–99)
Potassium: 3.9 mmol/L (ref 3.5–5.1)
Sodium: 136 mmol/L (ref 135–145)
Total Bilirubin: 0.5 mg/dL (ref 0.0–1.2)
Total Protein: 7.3 g/dL (ref 6.5–8.1)

## 2023-10-04 NOTE — Progress Notes (Signed)
 Hematology/Oncology Progress note Telephone:([phone]) [phone] Fax:([phone]) [phone]      CHIEF COMPLAINTS/REASON FOR VISIT:  Follow-up for multiple myeloma   ASSESSMENT & PLAN:   Cancer Staging  Multiple myeloma not having achieved remission (HCC) Staging form: Plasma Cell Myeloma and Plasma Cell Disorders, AJCC 8th Edition - Clinical stage from 04/22/2020: RISS Stage II (Beta-2 -microglobulin (mg/L): 2.7, Albumin (g/dL): 3.1, ISS: Stage II, High-risk cytogenetics: Absent, LDH: Normal) - Signed by Babara Call, MD on 08/10/2021   Multiple myeloma not having achieved remission (HCC) 0IgA lambda multiple myeloma, M protein 2.3, normal free light chain ratio Status post Dara Rvd, achieved CR, s/p autologous bone marrow transplant 02/15/2022 at [facility] 02/16/23  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  vaccinations per protocol per  [facility] are reviewed and discussed with patient. Light chain ratio gradually increases.  maintenance with revlimid  10 mg on days 1-21 of 28 day cycle - proceed with this cycle Revlimid  -  Zometa   - plan Q3 months to continue to 2 years of therapy [until Nov 2025],  next due August 2025 Recommend calcium  with vitamin D supplementation.   Encounter for antineoplastic chemotherapy Chemotherapy plan as listed above.  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  Monitor symptoms-stable   Hypocalcemia  calcium  600mg  BID Calcium  level has been stable.    Orders Placed This Encounter  Procedures   CMP (Cancer Center only)    Standing Status:   Future    Expected Date:   11/01/2023    Expiration Date:   01/30/2024   CBC with Differential (Cancer Center Only)    Standing Status:   Future    Expected Date:   11/01/2023    Expiration Date:    01/30/2024   Multiple Myeloma Panel (SPEP&IFE w/QIG)    Standing Status:   Future    Expected Date:   11/01/2023    Expiration Date:   01/30/2024   Kappa/lambda light chains    Standing Status:   Future    Expected Date:   11/01/2023    Expiration Date:   01/30/2024    Follow up in 4 weeks.  All questions were answered. The patient knows to call the clinic with any problems, questions or concerns.  Call Babara, MD, PhD [facility] Hematology Oncology 10/04/2023    HISTORY OF PRESENTING ILLNESS:  Eric Cruz is a 73 y.o. male presents for follow up of myeloma .  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  27% plasma cell, cytogenetics - normal, and myeloma FISH panel is negative.  Standard risk.  Congo red staining was added and was negative.  - 07/27/2021, bone marrow biopsy showed hypercellular marrow involved by plasma cell myeloma, CD138 immunohistochemistry plasma cells compromise approximately 70% of the cellular elements and are lambda restricted by light chain in situ hybridization.  Cytogenetics showed duplication of 1q.  Cytogenetics is normal.   04/22/2020 Cancer Staging   Staging form: Plasma Cell Myeloma and Plasma Cell Disorders, AJCC 8th Edition - Clinical stage from 04/22/2020: RISS Stage II (Beta-2 -microglobulin (mg/L): 2.7, Albumin (g/dL): 3.1, ISS: Stage II, High-risk cytogenetics: Absent, LDH: Normal) - Signed by Babara Call, MD  on 08/10/2021 Stage prefix: Initial diagnosis Beta 2 microglobulin range (mg/L): Less than 3.5 Albumin range (g/dL): Less than 3.5 Cytogenetics: 1q addition Lactate dehydrogenase (LDH) (U/L): 136 Serum calcium  level: Normal Serum creatinine level: Normal   08/12/2021 Imaging   PET scan showed 1. No evidence of active myeloma on skull base to thigh FDG  PET scan. 2. No soft tissue plasmacytoma.3. No lytic or suspicious lesion on CT portion exam.     08/22/2021 - 01/11/2022 Chemotherapy   Patient is on Treatment Plan : MYELOMA NEWLY DIAGNOSED TRANSPLANT CANDIDATE DaraVRd (Daratumumab  SQ) q21d x 6 Cycles (Induction/Consolidation)     08/24/2021 - 11/09/2021 Chemotherapy   Patient is on Treatment Plan : MYELOMA NEWLY DIAGNOSED TRANSPLANT CANDIDATE DaraVRd (Daratumumab  SQ) q21d x 6 Cycles (Induction/Consolidation)     11/02/2021 Imaging   Bilateral lower extremity US  negative for DVT   11/15/2021 Echocardiogram   1. Left ventricular ejection fraction, by estimation, is 60 to 65%. The left ventricle has normal function. The left ventricle has no regional wall motion abnormalities. Left ventricular diastolic parameters are consistent with Grade II diastolic dysfunction (pseudonormalization). The average left ventricular global longitudinal strain is -17.4 %.   2. Right ventricular systolic function is normal. The right ventricular size is normal. There is mildly elevated pulmonary artery systolic pressure. The estimated right ventricular systolic pressure is 37.7 mmHg.   3. The mitral valve is normal in structure. Mild mitral valve regurgitation. No evidence of mitral stenosis.  4. The aortic valve was not well visualized. Aortic valve regurgitation  is not visualized. No aortic stenosis is present.   5. The inferior vena cava is normal in size with greater than 50% respiratory variability, suggesting right atrial pressure of 3 mmHg.    01/23/2022 Bone Marrow Biopsy   Mildly hypercellular bone marrow (40%) with increased erythropoiesis and 2% plasma cells.    02/15/2022 Procedure   Status post autologous stem cell transplant at [facility] Preparative regimen with Melphalan 200 mg/m2 on 02/14/22 Infusion of stem cells - 6.7 x 10(6) on 02/15/22    06/13/2022 Procedure   Bone marrow biopsy at [facility] showed: Mildly hypercellular bone  marrow (40%) with trilineage hematopoiesis and no increase in plasma cells. MRD pending    06/30/2022 -  Chemotherapy   Started on maintenance Revlimid  10mg  D1-21 Q28 days.     For rheumatoid arthritis, patient was seen by Dr. Dolphus recently and is currently off methotrexate  and Humira .  + Bilateral lower extremity swelling.US  negative for DVT, rade II diastolic dysfunction one Echo  INTERVAL HISTORY Eric Cruz is a 73 y.o. male who has above history reviewed by me today presents for follow up visit for management of multiple myeloma He reports feeling well.  He has been eating healthy, stable weight.   Review of Systems  Constitutional:  Negative for appetite change, chills, fatigue, fever and unexpected weight change.  HENT:   Negative for hearing loss and voice change.   Eyes:  Negative for eye problems and icterus.  Respiratory:  Negative for chest tightness, cough and shortness of breath.   Cardiovascular:  Negative for chest pain and leg swelling.  Gastrointestinal:  Negative for abdominal distention and abdominal pain.  Endocrine: Negative for hot flashes.  Genitourinary:  Negative for difficulty urinating, dysuria and frequency.   Musculoskeletal:  Positive for arthralgias and back pain. Negative for myalgias.  Skin:  Negative for itching and rash.  Neurological:  Negative for light-headedness and numbness.  Hematological:  Negative for adenopathy. Does not bruise/bleed easily.  Psychiatric/Behavioral:  Negative for confusion.      MEDICAL HISTORY:  Past Medical History:  Diagnosis Date   Arthritis    BPH (benign prostatic hyperplasia)    Cancer (HCC)    Prostate: states he had a positive biopsy but had another one a year later and it was gone.    Depression    Dizziness    GERD (gastroesophageal reflux disease)    Gout    High cholesterol    Hypertension    Sleep apnea    has cpap - not currently wearing   Varicose veins     SURGICAL HISTORY: Past  Surgical History:  Procedure Laterality Date   ANTERIOR CERVICAL DECOMP/DISCECTOMY FUSION N/A 02/21/2018   Procedure: Cervical Five-Six Cervical Six-Seven Anterior cervical decompression/discectomy/fusion;  Surgeon: Gillie Duncans, MD;  Location: [facility] OR;  Service: Neurosurgery;  Laterality: N/A;  Cervical Five-Six Cervical Six-Seven Anterior cervical decompression/discectomy/fusion   BIOPSY  10/09/2019   Procedure: BIOPSY;  Surgeon: Saintclair Jasper, MD;  Location: [facility];  Service: Gastroenterology;;   BLADDER SURGERY     CARPAL TUNNEL RELEASE Right 02/11/2019   Procedure: Right Carpal Tunnel Wound Irrigation;  Surgeon: Gillie Duncans, MD;  Location: [facility] OR;  Service: Neurosurgery;  Laterality: Right;  Right Carpal tunnel wound exploration/wash out   CARPAL TUNNEL RELEASE Bilateral    CHOLECYSTECTOMY  11/22/2012   CHOLECYSTECTOMY  11/22/2012   Procedure: LAPAROSCOPIC CHOLECYSTECTOMY;  Surgeon: Lynwood MALVA Pina, MD;  Location: [facility] OR;  Service: General;;   COLONOSCOPY     COLONOSCOPY WITH PROPOFOL  N/A 10/09/2019   Procedure: COLONOSCOPY WITH PROPOFOL ;  Surgeon: Saintclair Jasper, MD;  Location: [facility];  Service: Gastroenterology;  Laterality: N/A;   ESOPHAGOGASTRODUODENOSCOPY (EGD) WITH PROPOFOL  N/A 10/09/2019   Procedure: ESOPHAGOGASTRODUODENOSCOPY (EGD) WITH PROPOFOL ;  Surgeon: Saintclair Jasper, MD;  Location: [facility];  Service: Gastroenterology;  Laterality: N/A;   POLYPECTOMY  10/09/2019   Procedure: POLYPECTOMY;  Surgeon: Saintclair Jasper, MD;  Location: [facility];  Service: Gastroenterology;;   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    SOCIAL HISTORY: Social History   Socioeconomic History   Marital status: Married    Spouse name: Not on file   Number of children: 2   Years of education: Not on file   Highest education level: Not on file  Occupational History   Occupation: Retired-mechanic  Tobacco Use   Smoking status: Former    Current packs/day: 0.00    Average packs/day: 1 pack/day for  31.0 years (31.0 ttl pk-yrs)    Types: Cigarettes    Start date: 04/13/1967    Quit date: 04/12/1998    Years since quitting: 25.4    Passive exposure: Never   Smokeless tobacco: Never  Vaping Use   Vaping status: Never Used  Substance and Sexual Activity   Alcohol use: No   Drug use: No   Sexual activity: Not on file  Other Topics Concern   Not on file  Social History Narrative   Not on file   Social Drivers of Health   Financial Resource Strain: Not on file  Food Insecurity: Not on file  Transportation Needs: Not on file  Physical Activity: Not on file  Stress: Not on file  Social Connections: Not on file  Intimate Partner Violence: Not on file    FAMILY HISTORY: Family History  Problem Relation Age of Onset   Hypertension Mother    Diabetes Mother    Multiple myeloma Mother  Hypertension Father    Diabetes Father    Healthy Son    Healthy Daughter     ALLERGIES:  is allergic to atorvastatin, simvastatin, other, and statins.  MEDICATIONS:  Current Outpatient Medications  Medication Sig Dispense Refill   acyclovir  (ZOVIRAX ) 800 MG tablet 1 tablet Orally Twice a day; Duration: 10 day(s)     Ascorbic Acid (VITA-C PO) Take 2,000 tablets by mouth daily at 6 (six) AM.     aspirin  EC 81 MG tablet Take 81 mg by mouth daily. Swallow whole.     calcium  carbonate (CALCIUM  600) 600 MG TABS tablet Take 1 tablet (600 mg total) by mouth 3 (three) times daily with meals.     Cyanocobalamin  (B-12) 2500 MCG TABS Take by mouth daily at 6 (six) AM.     Ensure (ENSURE) Take 237 mLs by mouth daily at 6 (six) AM.     Evolocumab  (REPATHA  SURECLICK) 140 MG/ML SOAJ Inject 140 mg into the skin every 14 (fourteen) days. 2 mL 10   ferrous sulfate 325 (65 FE) MG EC tablet Take 325 mg by mouth daily at 6 (six) AM.     finasteride  (PROSCAR ) 5 MG tablet Take 5 mg by mouth daily.     furosemide  (LASIX ) 40 MG tablet TAKE 1 TABLET BY MOUTH DAILY 90 tablet 2   lenalidomide  (REVLIMID ) 10 MG  capsule Take 1 capsule by mouth once daily for 21 days, then 7 days off. 21 capsule 0   Multiple Vitamin (MULTIVITAMIN ADULT PO) Take by mouth.     tamsulosin  (FLOMAX ) 0.4 MG CAPS capsule Take 0.4 mg by mouth at bedtime.     Turmeric 500 MG CAPS Take by mouth daily at 6 (six) AM.     Vitamin D-Vitamin K (K2 PLUS D3 PO) Take 500 tablets by mouth daily at 6 (six) AM.     Zoledronic  Acid (ZOMETA ) 4 MG/100ML IVPB as directed Intravenous Every 3 months     No current facility-administered medications for this visit.   Facility-Administered Medications Ordered in Other Visits  Medication Dose Route Frequency Provider Last Rate Last Admin   acetaminophen  (TYLENOL ) 325 MG tablet            dexamethasone  (DECADRON ) 4 MG tablet            diphenhydrAMINE  (BENADRYL ) 25 mg capsule              PHYSICAL EXAMINATION: ECOG PERFORMANCE STATUS: 1 - Symptomatic but completely ambulatory Vitals:   10/04/23 1057  BP: 114/64  Pulse: 62  Resp: 18  Temp: (!) 97.3 F (36.3 C)  SpO2: 98%   Filed Weights   10/04/23 1057  Weight: 286 lb 1.6 oz (129.8 kg)    Physical Exam Constitutional:      General: He is not in acute distress.    Appearance: He is obese.     Comments: Patient walks with a cane.  HENT:     Head: Normocephalic and atraumatic.  Eyes:     General: No scleral icterus. Cardiovascular:     Rate and Rhythm: Normal rate and regular rhythm.     Heart sounds: Normal heart sounds.  Pulmonary:     Effort: Pulmonary effort is normal. No respiratory distress.     Breath sounds: No wheezing.  Abdominal:     General: Bowel sounds are normal. There is no distension.     Palpations: Abdomen is soft.  Musculoskeletal:        General: No deformity. Normal  range of motion.     Cervical back: Normal range of motion and neck supple.     Comments: Trace edema, bilateral low extremities.   Skin:    General: Skin is warm and dry.     Findings: No erythema or rash.  Neurological:     Mental  Status: He is alert and oriented to person, place, and time. Mental status is at baseline.     Cranial Nerves: No cranial nerve deficit.     Coordination: Coordination normal.  Psychiatric:        Mood and Affect: Mood normal.      LABORATORY DATA:  I have reviewed the data as listed    Latest Ref Rng & Units 10/04/2023   10:38 AM 09/06/2023   10:40 AM 08/07/2023    1:05 PM  CBC  WBC 4.0 - 10.5 K/uL 4.9  5.1  5.1   Hemoglobin 13.0 - 17.0 g/dL 87.7  87.9  87.4   Hematocrit 39.0 - 52.0 % 37.0  36.2  37.7   Platelets 150 - 400 K/uL 227  273  218       Latest Ref Rng & Units 10/04/2023   10:38 AM 09/06/2023   10:40 AM 08/07/2023    1:05 PM  CMP  Glucose 70 - 99 mg/dL 890  98  897   BUN 8 - 23 mg/dL 17  19  14    Creatinine 0.61 - 1.24 mg/dL 9.00  9.05  9.01   Sodium 135 - 145 mmol/L 136  136  139   Potassium 3.5 - 5.1 mmol/L 3.9  3.9  3.8   Chloride 98 - 111 mmol/L 105  106  108   CO2 22 - 32 mmol/L 27  23  22    Calcium  8.9 - 10.3 mg/dL 8.9  8.8  8.9   Total Protein 6.5 - 8.1 g/dL 7.3  7.3  7.2   Total Bilirubin 0.0 - 1.2 mg/dL 0.5  0.5  0.4   Alkaline Phos 38 - 126 U/L 63  60  61   AST 15 - 41 U/L 19  17  18    ALT 0 - 44 U/L 7  9  9       RADIOGRAPHIC STUDIES: I have personally reviewed the radiological images as listed and agreed with the findings in the report.  XR KNEE 3 VIEW LEFT Result Date: 08/02/2023 No medial or lateral compartment narrowing was noted.  No patellofemoral narrowing was noted.  No chondrocalcinosis was noted. Impression: Unremarkable x-rays of the knee.  XR KNEE 3 VIEW RIGHT Result Date: 08/02/2023 No medial or lateral compartment narrowing was noted.  No patellofemoral narrowing was noted.  No chondrocalcinosis was noted. Impression: Unremarkable x-rays of the knee.

## 2023-10-04 NOTE — Progress Notes (Signed)
 Survivorship Care Plan visit completed.  Treatment summary reviewed and given to patient.  ASCO answers booklet reviewed and given to patient.  CARE program and Cancer Transitions discussed with patient along with other resources cancer center offers to patients and caregivers.  Patient verbalized understanding.

## 2023-10-04 NOTE — Assessment & Plan Note (Signed)
 secondary to carpal tunnel disease and chemotherapy Patient did not tolerate gabapentin.  Monitor symptoms-stable

## 2023-10-04 NOTE — Assessment & Plan Note (Signed)
 Chemotherapy plan as listed above

## 2023-10-04 NOTE — Assessment & Plan Note (Addendum)
 calcium  600mg  BID Calcium  level has been stable.

## 2023-10-04 NOTE — Assessment & Plan Note (Signed)
 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  vaccinations per protocol per  [facility] are reviewed and discussed with patient. Light chain ratio gradually increases.  maintenance with revlimid  10 mg on days 1-21 of 28 day cycle - proceed with this cycle Revlimid  -  Zometa   - plan Q3 months to continue to 2 years of therapy [until Nov 2025],  next due August 2025 Recommend calcium  with vitamin D supplementation.

## 2023-10-08 LAB — KAPPA/LAMBDA LIGHT CHAINS
Kappa free light chain: 36.6 mg/L — ABNORMAL HIGH (ref 3.3–19.4)
Kappa, lambda light chain ratio: 1.56 (ref 0.26–1.65)
Lambda free light chains: 23.4 mg/L (ref 5.7–26.3)

## 2023-10-08 LAB — MULTIPLE MYELOMA PANEL, SERUM
Albumin SerPl Elph-Mcnc: 3.7 g/dL (ref 2.9–4.4)
Albumin/Glob SerPl: 1.2 (ref 0.7–1.7)
Alpha 1: 0.2 g/dL (ref 0.0–0.4)
Alpha2 Glob SerPl Elph-Mcnc: 0.6 g/dL (ref 0.4–1.0)
B-Globulin SerPl Elph-Mcnc: 1.1 g/dL (ref 0.7–1.3)
Gamma Glob SerPl Elph-Mcnc: 1.1 g/dL (ref 0.4–1.8)
Globulin, Total: 3.1 g/dL (ref 2.2–3.9)
IgA: 232 mg/dL (ref 61–437)
IgG (Immunoglobin G), Serum: 1311 mg/dL (ref 603–1613)
IgM (Immunoglobulin M), Srm: 44 mg/dL (ref 20–172)
Total Protein ELP: 6.8 g/dL (ref 6.0–8.5)

## 2023-10-11 ENCOUNTER — Ambulatory Visit: Attending: Cardiovascular Disease | Admitting: Cardiovascular Disease

## 2023-10-11 ENCOUNTER — Encounter: Payer: Self-pay | Admitting: Cardiovascular Disease

## 2023-10-11 VITALS — BP 122/74 | HR 73 | Ht 68.0 in | Wt 286.0 lb

## 2023-10-11 DIAGNOSIS — I2510 Atherosclerotic heart disease of native coronary artery without angina pectoris: Secondary | ICD-10-CM

## 2023-10-11 DIAGNOSIS — I5032 Chronic diastolic (congestive) heart failure: Secondary | ICD-10-CM | POA: Diagnosis not present

## 2023-10-11 DIAGNOSIS — I340 Nonrheumatic mitral (valve) insufficiency: Secondary | ICD-10-CM | POA: Diagnosis not present

## 2023-10-11 NOTE — Patient Instructions (Signed)
 Medication Instructions:  No changes *If you need a refill on your cardiac medications before your next appointment, please call your pharmacy*  Lab Work: none  Testing/Procedures: No changes  Follow-Up: At [facility], you and your health needs are our priority.  As part of our continuing mission to provide you with exceptional heart care, our providers are all part of one team.  This team includes your primary Cardiologist (physician) and Advanced Practice Providers or APPs (Physician Assistants and Nurse Practitioners) who all work together to provide you with the care you need, when you need it.  Your next appointment:   12 month(s)  Provider:   Lonni Cash, MD

## 2023-10-11 NOTE — Progress Notes (Signed)
 Chief Complaint  Patient presents with   Follow-up    Chronic diastolic chf    History of Present Illness: 73 yo male with history of multiple myeloma, prostate cancer, rheumatoid arthritis, depression, GERD, hyperlipidemia, HTN, chronic diastolic CHF, former tobacco abuse and sleep apnea who is here today for follow up. I saw him in 2023 as a new patient for the evaluation of lower extremity edema. He was being treated for multiple myeloma with chemotherapy and noticed LE edema for several months. Echo 11/15/21 with LVEF=60-65%. Normal RV function. Mild mitral regurgitation. Coronary calcium  score of 664 in January 2025. He is statin intolerant. He is on Repatha  and tolerating well.   He is here today for follow up. The patient denies any chest pain, dyspnea, palpitations, lower extremity edema, orthopnea, PND, dizziness, near syncope or syncope.   Primary Care Physician: Rexanne Ingle, MD   Past Medical History:  Diagnosis Date   Arthritis    BPH (benign prostatic hyperplasia)    Cancer [facility])    Prostate: states he had a positive biopsy but had another one a year later and it was gone.    Depression    Dizziness    GERD (gastroesophageal reflux disease)    Gout    High cholesterol    Hypertension    Sleep apnea    has cpap - not currently wearing   Varicose veins     Past Surgical History:  Procedure Laterality Date   ANTERIOR CERVICAL DECOMP/DISCECTOMY FUSION N/A 02/21/2018   Procedure: Cervical Five-Six Cervical Six-Seven Anterior cervical decompression/discectomy/fusion;  Surgeon: Gillie Duncans, MD;  Location: [facility] OR;  Service: Neurosurgery;  Laterality: N/A;  Cervical Five-Six Cervical Six-Seven Anterior cervical decompression/discectomy/fusion   BIOPSY  10/09/2019   Procedure: BIOPSY;  Surgeon: Saintclair Jasper, MD;  [facility]: [facility];  Service: Gastroenterology;;   BLADDER SURGERY     CARPAL TUNNEL RELEASE Right 02/11/2019   Procedure: Right Carpal Tunnel Wound  Irrigation;  Surgeon: Gillie Duncans, MD;  Location: [facility] OR;  Service: Neurosurgery;  Laterality: Right;  Right Carpal tunnel wound exploration/wash out   CARPAL TUNNEL RELEASE Bilateral    CHOLECYSTECTOMY  11/22/2012   CHOLECYSTECTOMY  11/22/2012   Procedure: LAPAROSCOPIC CHOLECYSTECTOMY;  Surgeon: Lynwood MALVA Pina, MD;  Location: [facility] OR;  Service: General;;   COLONOSCOPY     COLONOSCOPY WITH PROPOFOL  N/A 10/09/2019   Procedure: COLONOSCOPY WITH PROPOFOL ;  Surgeon: Saintclair Jasper, MD;  [facility];  Service: Gastroenterology;  Laterality: N/A;   ESOPHAGOGASTRODUODENOSCOPY (EGD) WITH PROPOFOL  N/A 10/09/2019   Procedure: ESOPHAGOGASTRODUODENOSCOPY (EGD) WITH PROPOFOL ;  Surgeon: Saintclair Jasper, MD;  [facility];  Service: Gastroenterology;  Laterality: N/A;   POLYPECTOMY  10/09/2019   Procedure: POLYPECTOMY;  Surgeon: Saintclair Jasper, MD;  [facility];  Service: Gastroenterology;;   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     aspirin  EC 81 MG tablet Take 81 mg by mouth daily. Swallow whole.     calcium  carbonate (CALCIUM  600) 600 MG TABS tablet Take 1 tablet (600 mg total) by mouth 3 (three) times daily with meals.     Cyanocobalamin  (B-12) 2500 MCG TABS Take by mouth daily at 6 (six) AM.     Ensure (ENSURE) Take 237 mLs by  mouth daily at 6 (six) AM.     Evolocumab  (REPATHA  SURECLICK) 140 MG/ML SOAJ Inject 140 mg into the skin every 14 (fourteen) days. 2 mL 10   ferrous sulfate 325 (65 FE) MG EC tablet Take 325 mg by mouth daily at 6 (six) AM.     finasteride  (PROSCAR ) 5 MG tablet Take 5 mg by mouth daily.     furosemide  (LASIX ) 40 MG tablet TAKE 1 TABLET BY MOUTH DAILY 90 tablet 2   lenalidomide  (REVLIMID ) 10 MG capsule Take 1 capsule by mouth once daily for  21 days, then 7 days off. 21 capsule 0   Multiple Vitamin (MULTIVITAMIN ADULT PO) Take by mouth.     tamsulosin  (FLOMAX ) 0.4 MG CAPS capsule Take 0.4 mg by mouth at bedtime.     Turmeric 500 MG CAPS Take by mouth daily at 6 (six) AM.     Vitamin D-Vitamin K (K2 PLUS D3 PO) Take 500 tablets by mouth daily at 6 (six) AM.     Zoledronic  Acid (ZOMETA ) 4 MG/100ML IVPB as directed Intravenous Every 3 months     No current facility-administered medications for this visit.   Facility-Administered Medications Ordered in Other Visits  Medication Dose Route Frequency Provider Last Rate Last Admin   acetaminophen  (TYLENOL ) 325 MG tablet            dexamethasone  (DECADRON ) 4 MG tablet            diphenhydrAMINE  (BENADRYL ) 25 mg capsule             Allergies  Allergen Reactions   Atorvastatin     Other reaction(s): Unknown   Simvastatin     Arms ache Other reaction(s): Unknown   Other     Can only tolerate oxygen on a low level   Statins     Arms ache    Social History   Socioeconomic History   Marital status: Married    Spouse name: Not on file   Number of children: 2   Years of education: Not on file   Highest education level: Not on file  Occupational History   Occupation: Retired-mechanic  Tobacco Use   Smoking status: Former    Current packs/day: 0.00    Average packs/day: 1 pack/day for 31.0 years (31.0 ttl pk-yrs)    Types: Cigarettes    Start date: 04/13/1967    Quit date: 04/12/1998    Years since quitting: 25.5    Passive exposure: Never   Smokeless tobacco: Never  Vaping Use   Vaping status: Never Used  Substance and Sexual Activity   Alcohol use: No   Drug use: No   Sexual activity: Not on file  Other Topics Concern   Not on file  Social History Narrative   Not on file   Social Drivers of Health   Financial Resource Strain: Not on file  Food Insecurity: Not on file  Transportation Needs: Not on file  Physical Activity: Not on file  Stress: Not on file   Social Connections: Not on file  Intimate Partner Violence: Not on file    Family History  Problem Relation Age of Onset   Hypertension Mother    Diabetes Mother    Multiple myeloma Mother    Hypertension Father    Diabetes Father    Healthy Son    Healthy Daughter     Review of Systems:  As stated in the HPI and otherwise negative.   BP 122/74 (BP Location: Left Arm,  Patient Position: Sitting, Cuff Size: Large)   Pulse 73   Ht 5' 8 (1.727 m)   Wt 286 lb (129.7 kg)   BMI 43.49 kg/m   Physical Examination: General: Well developed, well nourished, NAD  HEENT: OP clear, mucus membranes moist  SKIN: warm, dry. No rashes. Neuro: No focal deficits  Musculoskeletal: Muscle strength 5/5 all ext  Psychiatric: Mood and affect normal  Neck: No JVD, no carotid bruits, no thyromegaly, no lymphadenopathy.  Lungs:Clear bilaterally, no wheezes, rhonci, crackles Cardiovascular: Regular rate and rhythm. No murmurs, gallops or rubs. Abdomen:Soft. Bowel sounds present. Non-tender.  Extremities: No lower extremity edema. Pulses are 2 + in the bilateral DP/PT.  EKG:  EKG is not ordered today. The ekg ordered today demonstrates   Recent Labs: 10/04/2023: ALT 7; BUN 17; Creatinine 0.99; Hemoglobin 12.2; Platelet Count 227; Potassium 3.9; Sodium 136   Lipid Panel    Component Value Date/Time   CHOL 150 07/06/2023 1505   TRIG 159 (H) 07/06/2023 1505   HDL 66 07/06/2023 1505   CHOLHDL 2.3 07/06/2023 1505   CHOLHDL 3.4 10/19/2022 0926   VLDL 30 10/19/2022 0926   LDLCALC 58 07/06/2023 1505     Wt Readings from Last 3 Encounters:  10/11/23 286 lb (129.7 kg)  10/04/23 286 lb 1.6 oz (129.8 kg)  09/06/23 286 lb 14.4 oz (130.1 kg)    Assessment and Plan:   1. Chronic diastolic CHF: Volume status is ok. No LE edema. Wt is stable. Continue Lasix .   2. Mitral regurgitation: Mild by echo in August 2023.   3. CAD without angina: He has no chest pain. CAD noted with abnormal coronary  calcium  scoring CT. LDL 58 in April 2025. Continue ASA and Repatha .   Labs/ tests ordered today include:   No orders of the defined types were placed in this encounter.  Disposition:   F/U with me in 12 months.   Signed, Lonni Cash, MD, [facility] 10/12/2023 8:31 AM    [facility] [address] [place], [place], [place]  [address] Phone: [phone]; Fax: [phone]

## 2023-10-24 ENCOUNTER — Telehealth: Payer: Self-pay | Admitting: *Deleted

## 2023-10-24 ENCOUNTER — Other Ambulatory Visit: Payer: Self-pay

## 2023-10-24 DIAGNOSIS — C9000 Multiple myeloma not having achieved remission: Secondary | ICD-10-CM

## 2023-10-24 MED ORDER — LENALIDOMIDE 10 MG PO CAPS: ORAL_CAPSULE | ORAL | 0 refills | Status: DC

## 2023-10-24 NOTE — Telephone Encounter (Signed)
 [facility] called today and needs a refill of Revlimid  10 mg 21 days on 7 days off

## 2023-10-24 NOTE — Telephone Encounter (Signed)
 Refill sent.

## 2023-11-01 ENCOUNTER — Inpatient Hospital Stay (HOSPITAL_BASED_OUTPATIENT_CLINIC_OR_DEPARTMENT_OTHER): Admitting: Oncology

## 2023-11-01 ENCOUNTER — Inpatient Hospital Stay

## 2023-11-01 ENCOUNTER — Encounter: Payer: Self-pay | Admitting: Oncology

## 2023-11-01 VITALS — BP 123/77 | HR 72 | Temp 97.1°F | Resp 18 | Wt 286.7 lb

## 2023-11-01 DIAGNOSIS — Z87891 Personal history of nicotine dependence: Secondary | ICD-10-CM | POA: Diagnosis not present

## 2023-11-01 DIAGNOSIS — Z9484 Stem cells transplant status: Secondary | ICD-10-CM | POA: Diagnosis not present

## 2023-11-01 DIAGNOSIS — C9000 Multiple myeloma not having achieved remission: Secondary | ICD-10-CM

## 2023-11-01 DIAGNOSIS — Z79899 Other long term (current) drug therapy: Secondary | ICD-10-CM | POA: Diagnosis not present

## 2023-11-01 DIAGNOSIS — Z7961 Long term (current) use of immunomodulator: Secondary | ICD-10-CM | POA: Diagnosis not present

## 2023-11-01 DIAGNOSIS — Z9481 Bone marrow transplant status: Secondary | ICD-10-CM | POA: Diagnosis not present

## 2023-11-01 DIAGNOSIS — G629 Polyneuropathy, unspecified: Secondary | ICD-10-CM

## 2023-11-01 DIAGNOSIS — Z7982 Long term (current) use of aspirin: Secondary | ICD-10-CM | POA: Diagnosis not present

## 2023-11-01 DIAGNOSIS — Z79624 Long term (current) use of inhibitors of nucleotide synthesis: Secondary | ICD-10-CM | POA: Diagnosis not present

## 2023-11-01 LAB — CBC WITH DIFFERENTIAL (CANCER CENTER ONLY)
Abs Immature Granulocytes: 0.01 K/uL (ref 0.00–0.07)
Basophils Absolute: 0.1 K/uL (ref 0.0–0.1)
Basophils Relative: 1 %
Eosinophils Absolute: 0.2 K/uL (ref 0.0–0.5)
Eosinophils Relative: 4 %
HCT: 38.1 % — ABNORMAL LOW (ref 39.0–52.0)
Hemoglobin: 12.7 g/dL — ABNORMAL LOW (ref 13.0–17.0)
Immature Granulocytes: 0 %
Lymphocytes Relative: 25 %
Lymphs Abs: 1.3 K/uL (ref 0.7–4.0)
MCH: 32.1 pg (ref 26.0–34.0)
MCHC: 33.3 g/dL (ref 30.0–36.0)
MCV: 96.2 fL (ref 80.0–100.0)
Monocytes Absolute: 0.6 K/uL (ref 0.1–1.0)
Monocytes Relative: 12 %
Neutro Abs: 2.9 K/uL (ref 1.7–7.7)
Neutrophils Relative %: 58 %
Platelet Count: 256 K/uL (ref 150–400)
RBC: 3.96 MIL/uL — ABNORMAL LOW (ref 4.22–5.81)
RDW: 14.8 % (ref 11.5–15.5)
WBC Count: 5.1 K/uL (ref 4.0–10.5)
nRBC: 0 % (ref 0.0–0.2)

## 2023-11-01 LAB — CMP (CANCER CENTER ONLY)
ALT: 9 U/L (ref 0–44)
AST: 18 U/L (ref 15–41)
Albumin: 3.8 g/dL (ref 3.5–5.0)
Alkaline Phosphatase: 73 U/L (ref 38–126)
Anion gap: 6 (ref 5–15)
BUN: 19 mg/dL (ref 8–23)
CO2: 23 mmol/L (ref 22–32)
Calcium: 9 mg/dL (ref 8.9–10.3)
Chloride: 106 mmol/L (ref 98–111)
Creatinine: 1.1 mg/dL (ref 0.61–1.24)
GFR, Estimated: >60 mL/min (ref >60)
Glucose, Bld: 100 mg/dL — ABNORMAL HIGH (ref 70–99)
Potassium: 4 mmol/L (ref 3.5–5.1)
Sodium: 135 mmol/L (ref 135–145)
Total Bilirubin: 0.4 mg/dL (ref 0.0–1.2)
Total Protein: 7.6 g/dL (ref 6.5–8.1)

## 2023-11-01 MED ORDER — ZOLEDRONIC ACID 4 MG/100ML IV SOLN
4.0000 mg | Freq: Once | INTRAVENOUS | Status: AC
  Administered 2023-11-01: 4 mg via INTRAVENOUS
  Filled 2023-11-01: qty 100

## 2023-11-01 MED ORDER — SODIUM CHLORIDE 0.9 % IV SOLN
INTRAVENOUS | Status: DC
  Filled 2023-11-01: qty 250

## 2023-11-01 NOTE — Assessment & Plan Note (Addendum)
 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  vaccinations per protocol per  [facility] are reviewed and discussed with patient. Light chain ratio gradually increases.  Hold revlimid  10 mg on days 1-21 of 28 day cycle - due to upcoming cataract procedure  Zometa   - plan Q3 months to continue to 2 years of therapy [until Nov 2025],   Recommend calcium  with vitamin D supplementation.

## 2023-11-01 NOTE — Assessment & Plan Note (Signed)
 secondary to carpal tunnel disease and chemotherapy Patient did not tolerate gabapentin.  Monitor symptoms-stable

## 2023-11-01 NOTE — Progress Notes (Signed)
 Hematology/Oncology Progress note [phone] [phone]      CHIEF COMPLAINTS/REASON FOR VISIT:  Follow-up for multiple myeloma   ASSESSMENT & PLAN:   Cancer Staging  Multiple myeloma not having achieved remission (HCC) Staging form: Plasma Cell Myeloma and Plasma Cell Disorders, AJCC 8th Edition - Clinical stage from 04/22/2020: RISS Stage II (Beta-2 -microglobulin (mg/L): 2.7, Albumin (g/dL): 3.1, ISS: Stage II, High-risk cytogenetics: Absent, LDH: Normal) - Signed by Babara Call, MD on 08/10/2021   Multiple myeloma not having achieved remission (HCC) 0IgA lambda multiple myeloma, M protein 2.3, normal free light chain ratio Status post Dara Rvd, achieved CR, 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  vaccinations per protocol per  [facility] are reviewed and discussed with patient. Light chain ratio gradually increases.  Hold revlimid  10 mg on days 1-21 of 28 day cycle - due to upcoming cataract procedure  Zometa   - plan Q3 months to continue to 2 years of therapy [until Nov 2025],   Recommend calcium  with vitamin D supplementation.   Hypocalcemia  calcium  600mg  BID Calcium  level has been stable.  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  Monitor symptoms-stable     No orders of the defined types were placed in this encounter.   Follow up in 4 weeks.  All questions were answered. The patient knows to call the clinic with any problems, questions or concerns.  Call Babara, MD, PhD [facility] 11/01/2023    HISTORY OF PRESENTING ILLNESS:  Eric Cruz is a 73 y.o. male presents for follow up of myeloma .  Oncology History  Multiple  myeloma not having achieved remission (HCC)  04/22/2020 Initial Diagnosis   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  27% plasma cell, cytogenetics - normal, and myeloma FISH panel is negative.  Standard risk.  Congo red staining was added and was negative.  - 07/27/2021, bone marrow biopsy showed hypercellular marrow involved by plasma cell myeloma, CD138 immunohistochemistry plasma cells compromise approximately 70% of the cellular elements and are lambda restricted by light chain in situ hybridization.  Cytogenetics showed duplication of 1q.  Cytogenetics is normal.   04/22/2020 Cancer Staging   Staging form: Plasma Cell Myeloma and Plasma Cell Disorders, AJCC 8th Edition - Clinical stage from 04/22/2020: RISS Stage II (Beta-2 -microglobulin (mg/L): 2.7, Albumin (g/dL): 3.1, ISS: Stage II, High-risk cytogenetics: Absent, LDH: Normal) - Signed by Babara Call, MD on 08/10/2021 Stage prefix: Initial diagnosis Beta 2 microglobulin range (mg/L): Less than 3.5 Albumin range (g/dL): Less than 3.5 Cytogenetics: 1q addition Lactate dehydrogenase (LDH) (U/L): 136 Serum calcium  level: Normal Serum creatinine level: Normal   08/12/2021 Imaging   PET scan showed 1. No evidence of active myeloma on skull base to thigh FDG PET scan. 2. No soft tissue plasmacytoma.3. No lytic or suspicious lesion on CT portion exam.     08/22/2021 - 01/11/2022 Chemotherapy   Patient is on Treatment Plan : MYELOMA NEWLY DIAGNOSED TRANSPLANT CANDIDATE DaraVRd (Daratumumab  SQ) q21d x 6 Cycles (Induction/Consolidation)     08/24/2021 - 11/09/2021 Chemotherapy   Patient is on Treatment Plan : MYELOMA NEWLY DIAGNOSED TRANSPLANT CANDIDATE DaraVRd (Daratumumab  SQ) q21d x 6 Cycles (Induction/Consolidation)  11/02/2021 Imaging   Bilateral lower extremity US  negative for DVT   11/15/2021 Echocardiogram   1. Left ventricular ejection fraction, by  estimation, is 60 to 65%. The left ventricle has normal function. The left ventricle has no regional wall motion abnormalities. Left ventricular diastolic parameters are consistent with Grade II diastolic dysfunction (pseudonormalization). The average left ventricular global longitudinal strain is -17.4 %.   2. Right ventricular systolic function is normal. The right ventricular size is normal. There is mildly elevated pulmonary artery systolic pressure. The estimated right ventricular systolic pressure is 37.7 mmHg.   3. The mitral valve is normal in structure. Mild mitral valve regurgitation. No evidence of mitral stenosis.  4. The aortic valve was not well visualized. Aortic valve regurgitation  is not visualized. No aortic stenosis is present.   5. The inferior vena cava is normal in size with greater than 50% respiratory variability, suggesting right atrial pressure of 3 mmHg.    01/23/2022 Bone Marrow Biopsy   Mildly hypercellular bone marrow (40%) with increased erythropoiesis and 2% plasma cells.    02/15/2022 Procedure   Status post autologous stem cell transplant at [facility] Preparative regimen with Melphalan 200 mg/m2 on 02/14/22 Infusion of stem cells - 6.7 x 10(6) on 02/15/22    06/13/2022 Procedure   Bone marrow biopsy at [facility] showed: Mildly hypercellular bone marrow (40%) with trilineage hematopoiesis and no increase in plasma cells. MRD pending    06/30/2022 -  Chemotherapy   Started on maintenance Revlimid  10mg  D1-21 Q28 days.     For rheumatoid arthritis, patient was seen by Dr. Dolphus recently and is currently off methotrexate  and Humira .  + Bilateral lower extremity swelling.US  negative for DVT, rade II diastolic dysfunction one Echo  INTERVAL HISTORY Eric Cruz is a 73 y.o. male who has above history reviewed by me today presents for follow up visit for management of multiple myeloma He reports feeling well.  He has been eating  healthy, stable weight. Upcoming cataract surgery  Review of Systems  Constitutional:  Negative for appetite change, chills, fatigue, fever and unexpected weight change.  HENT:   Negative for hearing loss and voice change.   Eyes:  Negative for eye problems and icterus.  Respiratory:  Negative for chest tightness, cough and shortness of breath.   Cardiovascular:  Negative for chest pain and leg swelling.  Gastrointestinal:  Negative for abdominal distention and abdominal pain.  Endocrine: Negative for hot flashes.  Genitourinary:  Negative for difficulty urinating, dysuria and frequency.   Musculoskeletal:  Positive for arthralgias and back pain. Negative for myalgias.  Skin:  Negative for itching and rash.  Neurological:  Negative for light-headedness and numbness.  Hematological:  Negative for adenopathy. Does not bruise/bleed easily.  Psychiatric/Behavioral:  Negative for confusion.      MEDICAL HISTORY:  Past Medical History:  Diagnosis Date   Arthritis    BPH (benign prostatic hyperplasia)    Cancer (HCC)    Prostate: states he had a positive biopsy but had another one a year later and it was gone.    Depression    Dizziness    GERD (gastroesophageal reflux disease)    Gout    High cholesterol    Hypertension    Sleep apnea    has cpap - not currently wearing   Varicose veins     SURGICAL HISTORY: Past Surgical History:  Procedure Laterality Date   ANTERIOR CERVICAL DECOMP/DISCECTOMY FUSION N/A 02/21/2018   Procedure:  Cervical Five-Six Cervical Six-Seven Anterior cervical decompression/discectomy/fusion;  Surgeon: Gillie Duncans, MD;  Location: [facility] OR;  Service: Neurosurgery;  Laterality: N/A;  Cervical Five-Six Cervical Six-Seven Anterior cervical decompression/discectomy/fusion   BIOPSY  10/09/2019   Procedure: BIOPSY;  Surgeon: Saintclair Jasper, MD;  [facility];  Service: Gastroenterology;;   BLADDER SURGERY     CARPAL TUNNEL RELEASE Right 02/11/2019    Procedure: Right Carpal Tunnel Wound Irrigation;  Surgeon: Gillie Duncans, MD;  Location: [facility] OR;  Service: Neurosurgery;  Laterality: Right;  Right Carpal tunnel wound exploration/wash out   CARPAL TUNNEL RELEASE Bilateral    CHOLECYSTECTOMY  11/22/2012   CHOLECYSTECTOMY  11/22/2012   Procedure: LAPAROSCOPIC CHOLECYSTECTOMY;  Surgeon: Lynwood MALVA Pina, MD;  Location: [facility] OR;  Service: General;;   COLONOSCOPY     COLONOSCOPY WITH PROPOFOL  N/A 10/09/2019   Procedure: COLONOSCOPY WITH PROPOFOL ;  Surgeon: Saintclair Jasper, MD;  [facility];  Service: Gastroenterology;  Laterality: N/A;   ESOPHAGOGASTRODUODENOSCOPY (EGD) WITH PROPOFOL  N/A 10/09/2019   Procedure: ESOPHAGOGASTRODUODENOSCOPY (EGD) WITH PROPOFOL ;  Surgeon: Saintclair Jasper, MD;  [facility];  Service: Gastroenterology;  Laterality: N/A;   POLYPECTOMY  10/09/2019   Procedure: POLYPECTOMY;  Surgeon: Saintclair Jasper, MD;  [facility];  Service: Gastroenterology;;   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    SOCIAL HISTORY: Social History   Socioeconomic History   Marital status: Married    Spouse name: Not on file   Number of children: 2   Years of education: Not on file   Highest education level: Not on file  Occupational History   Occupation: Retired-mechanic  Tobacco Use   Smoking status: Former    Current packs/day: 0.00    Average packs/day: 1 pack/day for 31.0 years (31.0 ttl pk-yrs)    Types: Cigarettes    Start date: 04/13/1967    Quit date: 04/12/1998    Years since quitting: 25.5    Passive exposure: Never   Smokeless tobacco: Never  Vaping Use   Vaping status: Never Used  Substance and Sexual Activity   Alcohol use: No   Drug use: No   Sexual activity: Not on file  Other Topics Concern   Not on file  Social History Narrative   Not on file   Social Drivers of Health   Financial Resource Strain: Not on file  Food Insecurity: Not on file  Transportation Needs: Not on file  Physical Activity: Not on  file  Stress: Not on file  Social Connections: Not on file  Intimate Partner Violence: Not on file    FAMILY HISTORY: Family History  Problem Relation Age of Onset   Hypertension Mother    Diabetes Mother    Multiple myeloma Mother    Hypertension Father    Diabetes Father    Healthy Son    Healthy Daughter     ALLERGIES:  is allergic to atorvastatin, simvastatin, other, and statins.  MEDICATIONS:  Current Outpatient Medications  Medication Sig Dispense Refill   acyclovir  (ZOVIRAX ) 800 MG tablet 1 tablet Orally Twice a day; Duration: 10 day(s)     Ascorbic Acid (VITA-C PO) Take 2,000 tablets by mouth daily at 6 (six) AM.     aspirin  EC 81 MG tablet Take 81 mg by mouth daily. Swallow whole.     calcium  carbonate (CALCIUM  600) 600 MG TABS tablet Take 1 tablet (600 mg total) by mouth 3 (three) times daily with meals.     Cyanocobalamin  (B-12) 2500 MCG TABS Take by mouth daily at  6 (six) AM.     Ensure (ENSURE) Take 237 mLs by mouth daily at 6 (six) AM.     Ergocalciferol (VITAMIN D2 PO) Take 1 capsule by mouth daily.     Evolocumab  (REPATHA  SURECLICK) 140 MG/ML SOAJ Inject 140 mg into the skin every 14 (fourteen) days. 2 mL 10   ferrous sulfate 325 (65 FE) MG EC tablet Take 325 mg by mouth daily at 6 (six) AM.     finasteride  (PROSCAR ) 5 MG tablet Take 5 mg by mouth daily.     furosemide  (LASIX ) 40 MG tablet TAKE 1 TABLET BY MOUTH DAILY 90 tablet 2   lenalidomide  (REVLIMID ) 10 MG capsule Take 1 capsule by mouth once daily for 21 days, then 7 days off. 21 capsule 0   Multiple Vitamin (MULTIVITAMIN ADULT PO) Take by mouth.     tamsulosin  (FLOMAX ) 0.4 MG CAPS capsule Take 0.4 mg by mouth at bedtime.     Turmeric 500 MG CAPS Take by mouth daily at 6 (six) AM.     Vitamin D-Vitamin K (K2 PLUS D3 PO) Take 500 tablets by mouth daily at 6 (six) AM.     Zoledronic  Acid (ZOMETA ) 4 MG/100ML IVPB as directed Intravenous Every 3 months     No current facility-administered medications for  this visit.   Facility-Administered Medications Ordered in Other Visits  Medication Dose Route Frequency Provider Last Rate Last Admin   acetaminophen  (TYLENOL ) 325 MG tablet            dexamethasone  (DECADRON ) 4 MG tablet            diphenhydrAMINE  (BENADRYL ) 25 mg capsule              PHYSICAL EXAMINATION: ECOG PERFORMANCE STATUS: 1 - Symptomatic but completely ambulatory Vitals:   11/01/23 1048  BP: 123/77  Pulse: 72  Resp: 18  Temp: (!) 97.1 F (36.2 C)  SpO2: 99%   Filed Weights   11/01/23 1048  Weight: 286 lb 11.2 oz (130 kg)    Physical Exam Constitutional:      General: He is not in acute distress.    Appearance: He is obese.     Comments: Patient walks with a cane.  HENT:     Head: Normocephalic and atraumatic.  Eyes:     General: No scleral icterus. Cardiovascular:     Rate and Rhythm: Normal rate and regular rhythm.     Heart sounds: Normal heart sounds.  Pulmonary:     Effort: Pulmonary effort is normal. No respiratory distress.     Breath sounds: No wheezing.  Abdominal:     General: Bowel sounds are normal. There is no distension.     Palpations: Abdomen is soft.  Musculoskeletal:        General: No deformity. Normal range of motion.     Cervical back: Normal range of motion and neck supple.     Comments: Trace edema, bilateral low extremities.   Skin:    General: Skin is warm and dry.     Findings: No erythema or rash.  Neurological:     Mental Status: He is alert and oriented to person, place, and time. Mental status is at baseline.     Cranial Nerves: No cranial nerve deficit.     Coordination: Coordination normal.  Psychiatric:        Mood and Affect: Mood normal.      LABORATORY DATA:  I have reviewed the data as listed  Latest Ref Rng & Units 11/01/2023   10:25 AM 10/04/2023   10:38 AM 09/06/2023   10:40 AM  CBC  WBC 4.0 - 10.5 K/uL 5.1  4.9  5.1   Hemoglobin 13.0 - 17.0 g/dL 87.2  87.7  87.9   Hematocrit 39.0 - 52.0 % 38.1  37.0   36.2   Platelets 150 - 400 K/uL 256  227  273       Latest Ref Rng & Units 11/01/2023   10:25 AM 10/04/2023   10:38 AM 09/06/2023   10:40 AM  CMP  Glucose 70 - 99 mg/dL 899  890  98   BUN 8 - 23 mg/dL 19  17  19    Creatinine 0.61 - 1.24 mg/dL 8.89  9.00  9.05   Sodium 135 - 145 mmol/L 135  136  136   Potassium 3.5 - 5.1 mmol/L 4.0  3.9  3.9   Chloride 98 - 111 mmol/L 106  105  106   CO2 22 - 32 mmol/L 23  27  23    Calcium  8.9 - 10.3 mg/dL 9.0  8.9  8.8   Total Protein 6.5 - 8.1 g/dL 7.6  7.3  7.3   Total Bilirubin 0.0 - 1.2 mg/dL 0.4  0.5  0.5   Alkaline Phos 38 - 126 U/L 73  63  60   AST 15 - 41 U/L 18  19  17    ALT 0 - 44 U/L 9  7  9       RADIOGRAPHIC STUDIES: I have personally reviewed the radiological images as listed and agreed with the findings in the report.  No results found.

## 2023-11-01 NOTE — Assessment & Plan Note (Signed)
 calcium  600mg  BID Calcium  level has been stable.

## 2023-11-01 NOTE — Progress Notes (Signed)
 Pt here for follow up. Pt has been diagnosed with cataracts since last visit.

## 2023-11-02 LAB — KAPPA/LAMBDA LIGHT CHAINS
Kappa free light chain: 35.9 mg/L — ABNORMAL HIGH (ref 3.3–19.4)
Kappa, lambda light chain ratio: 1.51 (ref 0.26–1.65)
Lambda free light chains: 23.8 mg/L (ref 5.7–26.3)

## 2023-11-05 DIAGNOSIS — H2513 Age-related nuclear cataract, bilateral: Secondary | ICD-10-CM | POA: Diagnosis not present

## 2023-11-05 LAB — MULTIPLE MYELOMA PANEL, SERUM
Albumin SerPl Elph-Mcnc: 3.6 g/dL (ref 2.9–4.4)
Albumin/Glob SerPl: 1.1 (ref 0.7–1.7)
Alpha 1: 0.2 g/dL (ref 0.0–0.4)
Alpha2 Glob SerPl Elph-Mcnc: 0.7 g/dL (ref 0.4–1.0)
B-Globulin SerPl Elph-Mcnc: 1.1 g/dL (ref 0.7–1.3)
Gamma Glob SerPl Elph-Mcnc: 1.2 g/dL (ref 0.4–1.8)
Globulin, Total: 3.3 g/dL (ref 2.2–3.9)
IgA: 258 mg/dL (ref 61–437)
IgG (Immunoglobin G), Serum: 1422 mg/dL (ref 603–1613)
IgM (Immunoglobulin M), Srm: 48 mg/dL (ref 15–143)
Total Protein ELP: 6.9 g/dL (ref 6.0–8.5)

## 2023-11-06 ENCOUNTER — Telehealth: Payer: Self-pay

## 2023-11-06 NOTE — Telephone Encounter (Signed)
   Patient Name: Eric Cruz  DOB: 1950-09-04 MRN: [id number]  Primary Cardiologist: Lonni Cash, MD  Chart reviewed as part of pre-operative protocol coverage. Cataract extractions are recognized in guidelines as low risk surgeries that do not typically require specific preoperative testing or holding of blood thinner therapy. Therefore, given past medical history and time since last visit, based on ACC/AHA guidelines, Eric Cruz would be at acceptable risk for the planned procedure without further cardiovascular testing.   I will route this recommendation to the requesting party via Epic fax function and remove from pre-op pool.  Please call with questions.  Anzel Kearse D Eun Vermeer, NP 11/06/2023, 8:43 AM

## 2023-11-06 NOTE — Telephone Encounter (Signed)
   Pre-operative Risk Assessment    Patient Name: Eric Cruz  DOB: [birth year] MRN: [id number]   Date of last office visit: 10/11/2023, Dr. Lonni Cash, MD Date of next office visit: NONE   Request for Surgical Clearance    Procedure:  Cataract Extraction by PE, IOL- Right then left  Date of Surgery:  Clearance TBD                                Surgeon: Dr. Lonni T. Maree Socks Group or Practice Name: [facility] number: [phone] Fax number: [phone]   Type of Clearance Requested:   - Medical    Type of Anesthesia:  IV sedation    Additional requests/questions:    Signed, [place]berry KANDICE Dunning   11/06/2023, 8:13 AM

## 2023-11-12 ENCOUNTER — Telehealth: Payer: Self-pay | Admitting: *Deleted

## 2023-11-12 ENCOUNTER — Other Ambulatory Visit: Payer: Self-pay

## 2023-11-12 NOTE — Telephone Encounter (Signed)
 The patient called back and said he is going to have his eye surgery on August 29 patient also said that the only thing that he needs to hold is just the day of the eye surgery all the other meds he can still take.  Now wants to know when he should stop the Revlimid  and then start the Revlimid ?

## 2023-11-12 NOTE — Telephone Encounter (Signed)
 I told the patient that they already got approval from here at the cancer center that you can have your eyes done.  Also I see the cardiac 1 that says that you are good to go on the eye surgery.  Patient said that he heard from anybody.  I told him that he should get on the phone and call the [facility] that he can get set up.  He needs to call us  back when he knows the date because Dr. Babara wanted to make sure when he should start him back on his Revlimid .  He states that he will call and tell me and when he has the date of the surgery and then Dr.yu will let us  know when he needs to start back with Revlimid .

## 2023-11-13 ENCOUNTER — Encounter: Payer: Self-pay | Admitting: Oncology

## 2023-11-13 NOTE — Telephone Encounter (Signed)
 Spoke to Dr. Babara and she would like for pt to hold off Revlimid  until after he has the surgery. Pt to see Dr. Babara a few days after surgery and then she will determine when to restart Revlimid . Pt informed of this and verbalized understanding.   Please schedule and mail appt reminders:  9/3 lab @ 1:15p/ MD @ 1:45p   Pt informed of date/time but would like appts mailed.

## 2023-11-26 ENCOUNTER — Encounter: Payer: Self-pay | Admitting: Podiatry

## 2023-11-26 ENCOUNTER — Ambulatory Visit: Admitting: Podiatry

## 2023-11-26 DIAGNOSIS — M79672 Pain in left foot: Secondary | ICD-10-CM

## 2023-11-26 DIAGNOSIS — M79671 Pain in right foot: Secondary | ICD-10-CM | POA: Diagnosis not present

## 2023-11-26 DIAGNOSIS — B351 Tinea unguium: Secondary | ICD-10-CM

## 2023-11-26 NOTE — Progress Notes (Signed)
 Patient presents for evaluation and treatment of tenderness and some redness around nails feet.  Tenderness around toes with walking and wearing shoes.  Complains of pain of distal aspect second toe right when he is walking.  Physical exam:  General appearance: Alert, pleasant, and in no acute distress.  Vascular: Pedal pulses: DP 2/4 B/L, PT 0/4 B/L. Moderate edema lower legs bilaterally  Neurological:    Dermatologic:  Nails thickened, disfigured, discolored 1-5 BL with subungual debris.  Redness and hypertrophic nail folds along nail folds bilaterally but no signs of drainage or infection.  Hyperkeratotic lesion with porokeratotic appearance distal aspect second toe right  Musculoskeletal:     Diagnosis: 1. Painful onychomycotic nails 1 through 5 bilaterally. 2. Pain toes 1 through 5 bilaterally.  Plan: -Debrided onychomycotic nails 1 through 5 bilaterally.  Sharply debrided nails with nail nipper and reduced with a power bur. -Dispensed silicone toe sleeve with toe For second toe right  Return 3 months RFC

## 2023-11-30 DIAGNOSIS — H25811 Combined forms of age-related cataract, right eye: Secondary | ICD-10-CM | POA: Diagnosis not present

## 2023-11-30 DIAGNOSIS — H2511 Age-related nuclear cataract, right eye: Secondary | ICD-10-CM | POA: Diagnosis not present

## 2023-11-30 DIAGNOSIS — G4733 Obstructive sleep apnea (adult) (pediatric): Secondary | ICD-10-CM | POA: Diagnosis not present

## 2023-11-30 DIAGNOSIS — I2510 Atherosclerotic heart disease of native coronary artery without angina pectoris: Secondary | ICD-10-CM | POA: Diagnosis not present

## 2023-12-04 ENCOUNTER — Other Ambulatory Visit: Payer: Self-pay

## 2023-12-04 DIAGNOSIS — C9000 Multiple myeloma not having achieved remission: Secondary | ICD-10-CM

## 2023-12-05 ENCOUNTER — Encounter: Payer: Self-pay | Admitting: Oncology

## 2023-12-05 ENCOUNTER — Inpatient Hospital Stay: Admitting: Oncology

## 2023-12-05 ENCOUNTER — Inpatient Hospital Stay: Attending: Oncology

## 2023-12-05 VITALS — BP 124/75 | HR 79 | Temp 97.2°F | Resp 18 | Wt 285.6 lb

## 2023-12-05 DIAGNOSIS — Z5111 Encounter for antineoplastic chemotherapy: Secondary | ICD-10-CM

## 2023-12-05 DIAGNOSIS — Z9484 Stem cells transplant status: Secondary | ICD-10-CM | POA: Diagnosis not present

## 2023-12-05 DIAGNOSIS — Z807 Family history of other malignant neoplasms of lymphoid, hematopoietic and related tissues: Secondary | ICD-10-CM | POA: Diagnosis not present

## 2023-12-05 DIAGNOSIS — Z9481 Bone marrow transplant status: Secondary | ICD-10-CM | POA: Insufficient documentation

## 2023-12-05 DIAGNOSIS — G629 Polyneuropathy, unspecified: Secondary | ICD-10-CM

## 2023-12-05 DIAGNOSIS — Z79624 Long term (current) use of inhibitors of nucleotide synthesis: Secondary | ICD-10-CM | POA: Diagnosis not present

## 2023-12-05 DIAGNOSIS — Z7983 Long term (current) use of bisphosphonates: Secondary | ICD-10-CM | POA: Diagnosis not present

## 2023-12-05 DIAGNOSIS — Z79899 Other long term (current) drug therapy: Secondary | ICD-10-CM | POA: Insufficient documentation

## 2023-12-05 DIAGNOSIS — Z87891 Personal history of nicotine dependence: Secondary | ICD-10-CM | POA: Diagnosis not present

## 2023-12-05 DIAGNOSIS — Z7982 Long term (current) use of aspirin: Secondary | ICD-10-CM | POA: Diagnosis not present

## 2023-12-05 DIAGNOSIS — C9000 Multiple myeloma not having achieved remission: Secondary | ICD-10-CM

## 2023-12-05 DIAGNOSIS — Z7961 Long term (current) use of immunomodulator: Secondary | ICD-10-CM | POA: Insufficient documentation

## 2023-12-05 LAB — CBC WITH DIFFERENTIAL (CANCER CENTER ONLY)
Abs Immature Granulocytes: 0.02 K/uL (ref 0.00–0.07)
Basophils Absolute: 0 K/uL (ref 0.0–0.1)
Basophils Relative: 0 %
Eosinophils Absolute: 0.1 K/uL (ref 0.0–0.5)
Eosinophils Relative: 2 %
HCT: 39.5 % (ref 39.0–52.0)
Hemoglobin: 13 g/dL (ref 13.0–17.0)
Immature Granulocytes: 0 %
Lymphocytes Relative: 21 %
Lymphs Abs: 1.4 K/uL (ref 0.7–4.0)
MCH: 31.7 pg (ref 26.0–34.0)
MCHC: 32.9 g/dL (ref 30.0–36.0)
MCV: 96.3 fL (ref 80.0–100.0)
Monocytes Absolute: 0.6 K/uL (ref 0.1–1.0)
Monocytes Relative: 9 %
Neutro Abs: 4.8 K/uL (ref 1.7–7.7)
Neutrophils Relative %: 68 %
Platelet Count: 213 K/uL (ref 150–400)
RBC: 4.1 MIL/uL — ABNORMAL LOW (ref 4.22–5.81)
RDW: 15 % (ref 11.5–15.5)
WBC Count: 7 K/uL (ref 4.0–10.5)
nRBC: 0 % (ref 0.0–0.2)

## 2023-12-05 LAB — CMP (CANCER CENTER ONLY)
ALT: 8 U/L (ref 0–44)
AST: 19 U/L (ref 15–41)
Albumin: 4.2 g/dL (ref 3.5–5.0)
Alkaline Phosphatase: 70 U/L (ref 38–126)
Anion gap: 8 (ref 5–15)
BUN: 18 mg/dL (ref 8–23)
CO2: 24 mmol/L (ref 22–32)
Calcium: 9.5 mg/dL (ref 8.9–10.3)
Chloride: 103 mmol/L (ref 98–111)
Creatinine: 1.04 mg/dL (ref 0.61–1.24)
GFR, Estimated: >60 mL/min (ref >60)
Glucose, Bld: 102 mg/dL — ABNORMAL HIGH (ref 70–99)
Potassium: 4.1 mmol/L (ref 3.5–5.1)
Sodium: 135 mmol/L (ref 135–145)
Total Bilirubin: 0.3 mg/dL (ref 0.0–1.2)
Total Protein: 7.9 g/dL (ref 6.5–8.1)

## 2023-12-05 NOTE — Progress Notes (Signed)
 Hematology/Oncology Progress note Telephone:([phone] Fax:([phone]) [phone]      CHIEF COMPLAINTS/REASON FOR VISIT:  Follow-up for multiple myeloma   ASSESSMENT & PLAN:   Cancer Staging  Multiple myeloma not having achieved remission (HCC) Staging form: Plasma Cell Myeloma and Plasma Cell Disorders, AJCC 8th Edition - Clinical stage from 04/22/2020: RISS Stage II (Beta-2 -microglobulin (mg/L): 2.7, Albumin (g/dL): 3.1, ISS: Stage II, High-risk cytogenetics: Absent, LDH: Normal) - Signed by Babara Call, MD on 08/10/2021   Multiple myeloma not having achieved remission (HCC) 0IgA lambda multiple myeloma, M protein 2.3, normal free light chain ratio Status post Dara Rvd, achieved CR, 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  vaccinations per protocol per  [facility] are reviewed and discussed with patient. Light chain ratio gradually increases.  Proceed revlimid  10 mg on days 1-21 of 28 day cycle   Zometa   - plan Q3 months to continue to 2 years of therapy [until Nov 2025],   Recommend calcium  with vitamin D supplementation.   Encounter for antineoplastic chemotherapy Chemotherapy plan as listed above.  Hypocalcemia  calcium  600mg  BID Calcium  level has been stable.  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  Monitor symptoms-stable     Orders Placed This Encounter  Procedures   CBC with Differential (Cancer Center Only)    Standing Status:   Future    Expected Date:   01/02/2024    Expiration Date:   04/01/2024   Multiple Myeloma Panel (SPEP&IFE w/QIG)    Standing Status:   Future    Expected Date:   01/02/2024    Expiration Date:   04/01/2024   Kappa/lambda light chains    Standing  Status:   Future    Expected Date:   01/02/2024    Expiration Date:   04/01/2024   CMP (Cancer Center only)    Standing Status:   Future    Expected Date:   01/02/2024    Expiration Date:   04/01/2024    Follow up in 4 weeks.  All questions were answered. The patient knows to call the clinic with any problems, questions or concerns.  Call Babara, MD, PhD [facility] Hematology Oncology 12/05/2023    HISTORY OF PRESENTING ILLNESS:  Eric Cruz is a 73 y.o. male presents for follow up of myeloma .  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  27% plasma cell, cytogenetics - normal, and myeloma FISH panel is negative.  Standard risk.  Congo red staining was added and was negative.  - 07/27/2021, bone marrow biopsy showed hypercellular marrow involved by plasma cell myeloma, CD138 immunohistochemistry plasma cells compromise approximately 70% of the cellular elements and are lambda restricted by light chain in situ hybridization.  Cytogenetics showed duplication of 1q.  Cytogenetics is normal.   04/22/2020 Cancer Staging   Staging form: Plasma Cell Myeloma and Plasma Cell Disorders, AJCC 8th Edition - Clinical stage from 04/22/2020: RISS Stage II (Beta-2 -microglobulin (mg/L): 2.7, Albumin (g/dL): 3.1, ISS: Stage II, High-risk cytogenetics: Absent, LDH: Normal) - Signed by Babara Call, MD on 08/10/2021 Stage prefix: Initial diagnosis Beta 2 microglobulin range (  mg/L): Less than 3.5 Albumin range (g/dL): Less than 3.5 Cytogenetics: 1q addition Lactate dehydrogenase (LDH) (U/L): 136 Serum calcium  level: Normal Serum creatinine level: Normal   08/12/2021 Imaging   PET scan showed 1. No evidence of active myeloma on skull base to thigh FDG PET scan. 2. No soft tissue plasmacytoma.3. No lytic or suspicious  lesion on CT portion exam.     08/22/2021 - 01/11/2022 Chemotherapy   Patient is on Treatment Plan : MYELOMA NEWLY DIAGNOSED TRANSPLANT CANDIDATE DaraVRd (Daratumumab  SQ) q21d x 6 Cycles (Induction/Consolidation)     08/24/2021 - 11/09/2021 Chemotherapy   Patient is on Treatment Plan : MYELOMA NEWLY DIAGNOSED TRANSPLANT CANDIDATE DaraVRd (Daratumumab  SQ) q21d x 6 Cycles (Induction/Consolidation)     11/02/2021 Imaging   Bilateral lower extremity US  negative for DVT   11/15/2021 Echocardiogram   1. Left ventricular ejection fraction, by estimation, is 60 to 65%. The left ventricle has normal function. The left ventricle has no regional wall motion abnormalities. Left ventricular diastolic parameters are consistent with Grade II diastolic dysfunction (pseudonormalization). The average left ventricular global longitudinal strain is -17.4 %.   2. Right ventricular systolic function is normal. The right ventricular size is normal. There is mildly elevated pulmonary artery systolic pressure. The estimated right ventricular systolic pressure is 37.7 mmHg.   3. The mitral valve is normal in structure. Mild mitral valve regurgitation. No evidence of mitral stenosis.  4. The aortic valve was not well visualized. Aortic valve regurgitation  is not visualized. No aortic stenosis is present.   5. The inferior vena cava is normal in size with greater than 50% respiratory variability, suggesting right atrial pressure of 3 mmHg.    01/23/2022 Bone Marrow Biopsy   Mildly hypercellular bone marrow (40%) with increased erythropoiesis and 2% plasma cells.    02/15/2022 Procedure   Status post autologous stem cell transplant at [facility] Preparative regimen with Melphalan 200 mg/m2 on 02/14/22 Infusion of stem cells - 6.7 x 10(6) on 02/15/22    06/13/2022 Procedure   Bone marrow biopsy at [facility] showed: Mildly hypercellular bone marrow (40%) with trilineage hematopoiesis and no increase in  plasma cells. MRD pending    06/30/2022 -  Chemotherapy   Started on maintenance Revlimid  10mg  D1-21 Q28 days.     For rheumatoid arthritis, patient was seen by Dr. Dolphus recently and is currently off methotrexate  and Humira .  + Bilateral lower extremity swelling.US  negative for DVT, rade II diastolic dysfunction one Echo  INTERVAL HISTORY Eric Cruz is a 73 y.o. male who has above history reviewed by me today presents for follow up visit for management of multiple myeloma He reports feeling well.  He has been eating healthy, stable weight. Upcoming cataract surgery  Review of Systems  Constitutional:  Negative for appetite change, chills, fatigue, fever and unexpected weight change.  HENT:   Negative for hearing loss and voice change.   Eyes:  Negative for eye problems and icterus.  Respiratory:  Negative for chest tightness, cough and shortness of breath.   Cardiovascular:  Negative for chest pain and leg swelling.  Gastrointestinal:  Negative for abdominal distention and abdominal pain.  Endocrine: Negative for hot flashes.  Genitourinary:  Negative for difficulty urinating, dysuria and frequency.   Musculoskeletal:  Positive for arthralgias and back pain. Negative for myalgias.  Skin:  Negative for itching and rash.  Neurological:  Negative for light-headedness and numbness.  Hematological:  Negative for adenopathy. Does not bruise/bleed easily.  Psychiatric/Behavioral:  Negative for confusion.      MEDICAL HISTORY:  Past Medical History:  Diagnosis Date   Arthritis    BPH (benign prostatic hyperplasia)    Cancer (HCC)    Prostate: states he had a positive biopsy but had another one a year later and it was gone.    Depression    Dizziness    GERD (gastroesophageal reflux disease)    Gout    High cholesterol    Hypertension    Sleep apnea    has cpap - not currently wearing   Varicose veins     SURGICAL HISTORY: Past Surgical History:  Procedure  Laterality Date   ANTERIOR CERVICAL DECOMP/DISCECTOMY FUSION N/A 02/21/2018   Procedure: Cervical Five-Six Cervical Six-Seven Anterior cervical decompression/discectomy/fusion;  Surgeon: Gillie Duncans, MD;  Location: [facility] OR;  Service: Neurosurgery;  Laterality: N/A;  Cervical Five-Six Cervical Six-Seven Anterior cervical decompression/discectomy/fusion   BIOPSY  10/09/2019   Procedure: BIOPSY;  Surgeon: Saintclair Jasper, MD;  Location: [facility] END[facility]COPY;  Service: Gastroenterology;;   BLADDER SURGERY     CARPAL TUNNEL RELEASE Right 02/11/2019   Procedure: Right Carpal Tunnel Wound Irrigation;  Surgeon: Gillie Duncans, MD;  Location: [facility] OR;  Service: Neurosurgery;  Laterality: Right;  Right Carpal tunnel wound exploration/wash out   CARPAL TUNNEL RELEASE Bilateral    CHOLECYSTECTOMY  11/22/2012   CHOLECYSTECTOMY  11/22/2012   Procedure: LAPAROSCOPIC CHOLECYSTECTOMY;  Surgeon: Lynwood MALVA Pina, MD;  Location: [facility] OR;  Service: General;;   COLONOSCOPY     COLONOSCOPY WITH PROPOFOL  N/A 10/09/2019   Procedure: COLONOSCOPY WITH PROPOFOL ;  Surgeon: Saintclair Jasper, MD;  Location: [facility];  Service: Gastroenterology;  Laterality: N/A;   ESOPHAGOGASTRODUODENOSCOPY (EGD) WITH PROPOFOL  N/A 10/09/2019   Procedure: ESOPHAGOGASTRODUODENOSCOPY (EGD) WITH PROPOFOL ;  Surgeon: Saintclair Jasper, MD;  Location: [facility];  Service: Gastroenterology;  Laterality: N/A;   POLYPECTOMY  10/09/2019   Procedure: POLYPECTOMY;  Surgeon: Saintclair Jasper, MD;  Location: [facility];  Service: Gastroenterology;;   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    SOCIAL HISTORY: Social History   Socioeconomic History   Marital status: Married    Spouse name: Not on file   Number of children: 2   Years of education: Not on file   Highest education level: Not on file  Occupational History   Occupation: Retired-mechanic  Tobacco Use   Smoking status: Former    Current packs/day: 0.00    Average packs/day: 1 pack/day for 31.0 years (31.0 ttl pk-yrs)     Types: Cigarettes    Start date: 04/13/1967    Quit date: 04/12/1998    Years since quitting: 25.6    Passive exposure: Never   Smokeless tobacco: Never  Vaping Use   Vaping status: Never Used  Substance and Sexual Activity   Alcohol use: No   Drug use: No   Sexual activity: Not on file  Other Topics Concern   Not on file  Social History Narrative   Not on file   Social Drivers of Health   Financial Resource Strain: Not on file  Food Insecurity: Not on file  Transportation Needs: Not on file  Physical Activity: Not on file  Stress: Not on file  Social Connections: Not on file  Intimate Partner Violence: Not on file    FAMILY HISTORY: Family History  Problem Relation Age of Onset   Hypertension Mother    Diabetes Mother    Multiple myeloma Mother    Hypertension Father    Diabetes Father  Healthy Son    Healthy Daughter     ALLERGIES:  is allergic to atorvastatin, simvastatin, other, and statins.  MEDICATIONS:  Current Outpatient Medications  Medication Sig Dispense Refill   acyclovir  (ZOVIRAX ) 800 MG tablet 1 tablet Orally Twice a day; Duration: 10 day(s)     Ascorbic Acid (VITA-C PO) Take 2,000 tablets by mouth daily at 6 (six) AM.     aspirin  EC 81 MG tablet Take 81 mg by mouth daily. Swallow whole.     calcium  carbonate (CALCIUM  600) 600 MG TABS tablet Take 1 tablet (600 mg total) by mouth 3 (three) times daily with meals.     Cyanocobalamin  (B-12) 2500 MCG TABS Take by mouth daily at 6 (six) AM.     Ensure (ENSURE) Take 237 mLs by mouth daily at 6 (six) AM.     Ergocalciferol (VITAMIN D2 PO) Take 1 capsule by mouth daily.     Evolocumab  (REPATHA  SURECLICK) 140 MG/ML SOAJ Inject 140 mg into the skin every 14 (fourteen) days. 2 mL 10   ferrous sulfate 325 (65 FE) MG EC tablet Take 325 mg by mouth daily at 6 (six) AM.     finasteride  (PROSCAR ) 5 MG tablet Take 5 mg by mouth daily.     furosemide  (LASIX ) 40 MG tablet TAKE 1 TABLET BY MOUTH DAILY 90 tablet 2    Multiple Vitamin (MULTIVITAMIN ADULT PO) Take by mouth.     ofloxacin (OCUFLOX) 0.3 % ophthalmic solution INSTILL 1 DROP INTO RIGHT EYE 4 TIMES DAILY FOR 7 DAYS     prednisoLONE acetate (PRED FORTE) 1 % ophthalmic suspension INSTILL 1 DROP INTO RIGHT EYE 4 TIMES A DAY FOR 1 WEEK THEN 2 TIMES A DAY FOR 1 WEEK THEN DAILY FOR 2 WEEKS THEN STOP     tamsulosin  (FLOMAX ) 0.4 MG CAPS capsule Take 0.4 mg by mouth at bedtime.     Turmeric 500 MG CAPS Take by mouth daily at 6 (six) AM.     Vitamin D-Vitamin K (K2 PLUS D3 PO) Take 500 tablets by mouth daily at 6 (six) AM.     Zoledronic  Acid (ZOMETA ) 4 MG/100ML IVPB as directed Intravenous Every 3 months     lenalidomide  (REVLIMID ) 10 MG capsule Take 1 capsule by mouth once daily for 21 days, then 7 days off. (Patient not taking: Reported on 12/05/2023) 21 capsule 0   No current facility-administered medications for this visit.   Facility-Administered Medications Ordered in Other Visits  Medication Dose Route Frequency Provider Last Rate Last Admin   acetaminophen  (TYLENOL ) 325 MG tablet            dexamethasone  (DECADRON ) 4 MG tablet            diphenhydrAMINE  (BENADRYL ) 25 mg capsule              PHYSICAL EXAMINATION: ECOG PERFORMANCE STATUS: 1 - Symptomatic but completely ambulatory Vitals:   12/05/23 1352  BP: 124/75  Pulse: 79  Resp: 18  Temp: (!) 97.2 F (36.2 C)  SpO2: 98%   Filed Weights   12/05/23 1352  Weight: 285 lb 9.6 oz (129.5 kg)    Physical Exam Constitutional:      General: He is not in acute distress.    Appearance: He is obese.     Comments: Patient walks with a cane.  HENT:     Head: Normocephalic and atraumatic.  Eyes:     General: No scleral icterus. Cardiovascular:     Rate and Rhythm:  Normal rate and regular rhythm.     Heart sounds: Normal heart sounds.  Pulmonary:     Effort: Pulmonary effort is normal. No respiratory distress.     Breath sounds: No wheezing.  Abdominal:     General: Bowel sounds are  normal. There is no distension.     Palpations: Abdomen is soft.  Musculoskeletal:        General: No deformity. Normal range of motion.     Cervical back: Normal range of motion and neck supple.     Comments: Trace edema, bilateral low extremities.   Skin:    General: Skin is warm and dry.     Findings: No erythema or rash.  Neurological:     Mental Status: He is alert and oriented to person, place, and time. Mental status is at baseline.     Cranial Nerves: No cranial nerve deficit.     Coordination: Coordination normal.  Psychiatric:        Mood and Affect: Mood normal.      LABORATORY DATA:  I have reviewed the data as listed    Latest Ref Rng & Units 12/05/2023    1:12 PM 11/01/2023   10:25 AM 10/04/2023   10:38 AM  CBC  WBC 4.0 - 10.5 K/uL 7.0  5.1  4.9   Hemoglobin 13.0 - 17.0 g/dL 86.9  87.2  87.7   Hematocrit 39.0 - 52.0 % 39.5  38.1  37.0   Platelets 150 - 400 K/uL 213  256  227       Latest Ref Rng & Units 12/05/2023    1:12 PM 11/01/2023   10:25 AM 10/04/2023   10:38 AM  CMP  Glucose 70 - 99 mg/dL 897  899  890   BUN 8 - 23 mg/dL 18  19  17    Creatinine 0.61 - 1.24 mg/dL 8.95  8.89  9.00   Sodium 135 - 145 mmol/L 135  135  136   Potassium 3.5 - 5.1 mmol/L 4.1  4.0  3.9   Chloride 98 - 111 mmol/L 103  106  105   CO2 22 - 32 mmol/L 24  23  27    Calcium  8.9 - 10.3 mg/dL 9.5  9.0  8.9   Total Protein 6.5 - 8.1 g/dL 7.9  7.6  7.3   Total Bilirubin 0.0 - 1.2 mg/dL 0.3  0.4  0.5   Alkaline Phos 38 - 126 U/L 70  73  63   AST 15 - 41 U/L 19  18  19    ALT 0 - 44 U/L 8  9  7       RADIOGRAPHIC STUDIES: I have personally reviewed the radiological images as listed and agreed with the findings in the report.  No results found.

## 2023-12-05 NOTE — Assessment & Plan Note (Signed)
 secondary to carpal tunnel disease and chemotherapy Patient did not tolerate gabapentin.  Monitor symptoms-stable

## 2023-12-05 NOTE — Assessment & Plan Note (Signed)
 Chemotherapy plan as listed above

## 2023-12-05 NOTE — Assessment & Plan Note (Addendum)
 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  vaccinations per protocol per  [facility] are reviewed and discussed with patient. Light chain ratio gradually increases.  Proceed revlimid  10 mg on days 1-21 of 28 day cycle   Zometa   - plan Q3 months to continue to 2 years of therapy [until Nov 2025],   Recommend calcium  with vitamin D supplementation.

## 2023-12-05 NOTE — Assessment & Plan Note (Signed)
 calcium  600mg  BID Calcium  level has been stable.

## 2023-12-06 LAB — KAPPA/LAMBDA LIGHT CHAINS
Kappa free light chain: 24.3 mg/L — ABNORMAL HIGH (ref 3.3–19.4)
Kappa, lambda light chain ratio: 1.58 (ref 0.26–1.65)
Lambda free light chains: 15.4 mg/L (ref 5.7–26.3)

## 2023-12-10 LAB — MULTIPLE MYELOMA PANEL, SERUM
Albumin SerPl Elph-Mcnc: 3.8 g/dL (ref 2.9–4.4)
Albumin/Glob SerPl: 1.1 (ref 0.7–1.7)
Alpha 1: 0.3 g/dL (ref 0.0–0.4)
Alpha2 Glob SerPl Elph-Mcnc: 0.7 g/dL (ref 0.4–1.0)
B-Globulin SerPl Elph-Mcnc: 1.2 g/dL (ref 0.7–1.3)
Gamma Glob SerPl Elph-Mcnc: 1.3 g/dL (ref 0.4–1.8)
Globulin, Total: 3.5 g/dL (ref 2.2–3.9)
IgA: 248 mg/dL (ref 61–437)
IgG (Immunoglobin G), Serum: 1338 mg/dL (ref 603–1613)
IgM (Immunoglobulin M), Srm: 34 mg/dL (ref 15–143)
Total Protein ELP: 7.3 g/dL (ref 6.0–8.5)

## 2023-12-11 ENCOUNTER — Telehealth: Payer: Self-pay | Admitting: Pharmacy Technician

## 2023-12-11 NOTE — Telephone Encounter (Signed)
 Pharmacy Patient Advocate Encounter   Received notification from Onbase that prior authorization for repatha  is required/requested.   Insurance verification completed.   The patient is insured through [facility] .   Per test claim: PA required; PA submitted to above mentioned insurance via Latent Key/confirmation #/EOC [id number] Status is pending

## 2023-12-11 NOTE — Telephone Encounter (Signed)
 Pharmacy Patient Advocate Encounter  Received notification from [employer] that Prior Authorization for repatha  has been APPROVED from 12/11/23 to 04/02/24   PA #/Case ID/Reference #: [id number]

## 2023-12-28 DIAGNOSIS — I2510 Atherosclerotic heart disease of native coronary artery without angina pectoris: Secondary | ICD-10-CM | POA: Diagnosis not present

## 2023-12-28 DIAGNOSIS — Z87891 Personal history of nicotine dependence: Secondary | ICD-10-CM | POA: Diagnosis not present

## 2023-12-28 DIAGNOSIS — H25812 Combined forms of age-related cataract, left eye: Secondary | ICD-10-CM | POA: Diagnosis not present

## 2023-12-28 DIAGNOSIS — H2512 Age-related nuclear cataract, left eye: Secondary | ICD-10-CM | POA: Diagnosis not present

## 2024-01-07 ENCOUNTER — Inpatient Hospital Stay

## 2024-01-07 ENCOUNTER — Inpatient Hospital Stay: Attending: Oncology

## 2024-01-07 ENCOUNTER — Encounter: Payer: Self-pay | Admitting: Oncology

## 2024-01-07 ENCOUNTER — Inpatient Hospital Stay: Admitting: Oncology

## 2024-01-07 VITALS — BP 120/78 | HR 90 | Temp 97.5°F | Resp 18 | Wt 283.0 lb

## 2024-01-07 DIAGNOSIS — Z5111 Encounter for antineoplastic chemotherapy: Secondary | ICD-10-CM | POA: Diagnosis not present

## 2024-01-07 DIAGNOSIS — Z7961 Long term (current) use of immunomodulator: Secondary | ICD-10-CM | POA: Insufficient documentation

## 2024-01-07 DIAGNOSIS — Z23 Encounter for immunization: Secondary | ICD-10-CM | POA: Insufficient documentation

## 2024-01-07 DIAGNOSIS — Z79899 Other long term (current) drug therapy: Secondary | ICD-10-CM | POA: Diagnosis not present

## 2024-01-07 DIAGNOSIS — Z7983 Long term (current) use of bisphosphonates: Secondary | ICD-10-CM | POA: Diagnosis not present

## 2024-01-07 DIAGNOSIS — Z9481 Bone marrow transplant status: Secondary | ICD-10-CM | POA: Insufficient documentation

## 2024-01-07 DIAGNOSIS — Z7982 Long term (current) use of aspirin: Secondary | ICD-10-CM | POA: Insufficient documentation

## 2024-01-07 DIAGNOSIS — Z9484 Stem cells transplant status: Secondary | ICD-10-CM | POA: Insufficient documentation

## 2024-01-07 DIAGNOSIS — C9000 Multiple myeloma not having achieved remission: Secondary | ICD-10-CM

## 2024-01-07 DIAGNOSIS — G629 Polyneuropathy, unspecified: Secondary | ICD-10-CM

## 2024-01-07 DIAGNOSIS — Z87891 Personal history of nicotine dependence: Secondary | ICD-10-CM | POA: Diagnosis not present

## 2024-01-07 DIAGNOSIS — Z807 Family history of other malignant neoplasms of lymphoid, hematopoietic and related tissues: Secondary | ICD-10-CM | POA: Insufficient documentation

## 2024-01-07 DIAGNOSIS — Z833 Family history of diabetes mellitus: Secondary | ICD-10-CM | POA: Diagnosis not present

## 2024-01-07 DIAGNOSIS — Z79624 Long term (current) use of inhibitors of nucleotide synthesis: Secondary | ICD-10-CM | POA: Insufficient documentation

## 2024-01-07 DIAGNOSIS — Z8249 Family history of ischemic heart disease and other diseases of the circulatory system: Secondary | ICD-10-CM | POA: Insufficient documentation

## 2024-01-07 LAB — CBC WITH DIFFERENTIAL (CANCER CENTER ONLY)
Abs Immature Granulocytes: 0.07 K/uL (ref 0.00–0.07)
Basophils Absolute: 0.1 K/uL (ref 0.0–0.1)
Basophils Relative: 1 %
Eosinophils Absolute: 0.1 K/uL (ref 0.0–0.5)
Eosinophils Relative: 1 %
HCT: 38.4 % — ABNORMAL LOW (ref 39.0–52.0)
Hemoglobin: 12.9 g/dL — ABNORMAL LOW (ref 13.0–17.0)
Immature Granulocytes: 1 %
Lymphocytes Relative: 19 %
Lymphs Abs: 1.2 K/uL (ref 0.7–4.0)
MCH: 32.1 pg (ref 26.0–34.0)
MCHC: 33.6 g/dL (ref 30.0–36.0)
MCV: 95.5 fL (ref 80.0–100.0)
Monocytes Absolute: 0.7 K/uL (ref 0.1–1.0)
Monocytes Relative: 11 %
Neutro Abs: 4.2 K/uL (ref 1.7–7.7)
Neutrophils Relative %: 67 %
Platelet Count: 252 K/uL (ref 150–400)
RBC: 4.02 MIL/uL — ABNORMAL LOW (ref 4.22–5.81)
RDW: 14.2 % (ref 11.5–15.5)
WBC Count: 6.3 K/uL (ref 4.0–10.5)
nRBC: 0 % (ref 0.0–0.2)

## 2024-01-07 LAB — CMP (CANCER CENTER ONLY)
ALT: 8 U/L (ref 0–44)
AST: 23 U/L (ref 15–41)
Albumin: 3.8 g/dL (ref 3.5–5.0)
Alkaline Phosphatase: 75 U/L (ref 38–126)
Anion gap: 7 (ref 5–15)
BUN: 14 mg/dL (ref 8–23)
CO2: 25 mmol/L (ref 22–32)
Calcium: 9.3 mg/dL (ref 8.9–10.3)
Chloride: 104 mmol/L (ref 98–111)
Creatinine: 1.08 mg/dL (ref 0.61–1.24)
GFR, Estimated: >60 mL/min (ref >60)
Glucose, Bld: 112 mg/dL — ABNORMAL HIGH (ref 70–99)
Potassium: 3.8 mmol/L (ref 3.5–5.1)
Sodium: 136 mmol/L (ref 135–145)
Total Bilirubin: 0.4 mg/dL (ref 0.0–1.2)
Total Protein: 7.4 g/dL (ref 6.5–8.1)

## 2024-01-07 MED ORDER — INFLUENZA VAC SPLIT HIGH-DOSE 0.5 ML IM SUSY
0.5000 mL | PREFILLED_SYRINGE | INTRAMUSCULAR | Status: AC
  Administered 2024-01-07: 0.5 mL via INTRAMUSCULAR
  Filled 2024-01-07: qty 0.5

## 2024-01-07 MED ORDER — LENALIDOMIDE 10 MG PO CAPS: ORAL_CAPSULE | ORAL | 0 refills | Status: DC

## 2024-01-07 NOTE — Assessment & Plan Note (Signed)
 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  vaccinations per protocol per  [facility] are reviewed and discussed with patient. Light chain ratio gradually increases.  Proceed revlimid  10 mg on days 1-21 of 28 day cycle   Zometa   - plan Q3 months to continue to 2 years of therapy [until Nov 2025],   Recommend calcium  with vitamin D supplementation.

## 2024-01-07 NOTE — Assessment & Plan Note (Signed)
 Chemotherapy plan as listed above

## 2024-01-07 NOTE — Assessment & Plan Note (Signed)
 calcium  600mg  BID Calcium  level has been stable.

## 2024-01-07 NOTE — Assessment & Plan Note (Signed)
 secondary to carpal tunnel disease and chemotherapy Patient did not tolerate gabapentin.  Monitor symptoms-stable

## 2024-01-07 NOTE — Assessment & Plan Note (Signed)
Influenza vaccination today 

## 2024-01-07 NOTE — Progress Notes (Signed)
 Hematology/Oncology Progress note [phone] Fax:([phone]      CHIEF COMPLAINTS/REASON FOR VISIT:  Follow-up for multiple myeloma   ASSESSMENT & PLAN:   Cancer Staging  Multiple myeloma not having achieved remission (HCC) Staging form: Plasma Cell Myeloma and Plasma Cell Disorders, AJCC 8th Edition - Clinical stage from 04/22/2020: RISS Stage II (Beta-2 -microglobulin (mg/L): 2.7, Albumin (g/dL): 3.1, ISS: Stage II, High-risk cytogenetics: Absent, LDH: Normal) - Signed by Babara Call, MD on 08/10/2021   Multiple myeloma not having achieved remission (HCC) 0IgA lambda multiple myeloma, M protein 2.3, normal free light chain ratio Status post Dara Rvd, achieved CR, 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  vaccinations per protocol per  [facility] are reviewed and discussed with patient. Light chain ratio gradually increases.  Proceed revlimid  10 mg on days 1-21 of 28 day cycle   Zometa   - plan Q3 months to continue to 2 years of therapy [until Nov 2025],   Recommend calcium  with vitamin D supplementation.   Encounter for antineoplastic chemotherapy Chemotherapy plan as listed above.  Hypocalcemia  calcium  600mg  BID Calcium  level has been stable.  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  Monitor symptoms-stable   Need for prophylactic vaccination and inoculation against influenza Influenza vaccination today     Orders Placed This Encounter  Procedures   CMP (Cancer Center only)    Standing Status:   Future    Expected Date:   02/04/2024    Expiration Date:   05/04/2024   CBC with Differential (Cancer Center Only)    Standing Status:   Future    Expected Date:    02/04/2024    Expiration Date:   05/04/2024   Multiple Myeloma Panel (SPEP&IFE w/QIG)    Standing Status:   Future    Expected Date:   02/04/2024    Expiration Date:   05/04/2024   Kappa/lambda light chains    Standing Status:   Future    Expected Date:   02/04/2024    Expiration Date:   05/04/2024    Follow up in 4 weeks.  All questions were answered. The patient knows to call the clinic with any problems, questions or concerns.  Call Babara, MD, PhD [facility] Hematology Oncology 01/07/2024    HISTORY OF PRESENTING ILLNESS:  Eric Cruz is a 73 y.o. male presents for follow up of myeloma .  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  27% plasma cell, cytogenetics - normal, and myeloma FISH panel is negative.  Standard risk.  Congo red staining was added and was negative.  - 07/27/2021, bone marrow biopsy showed hypercellular marrow involved by plasma cell myeloma, CD138 immunohistochemistry plasma cells compromise approximately 70% of the cellular elements and are lambda restricted by light chain in situ hybridization.  Cytogenetics showed duplication of 1q.  Cytogenetics is normal.   04/22/2020 Cancer Staging   Staging form: Plasma Cell Myeloma and Plasma Cell Disorders, AJCC 8th Edition - Clinical stage from 04/22/2020: RISS Stage II (Beta-2 -microglobulin (mg/L): 2.7, Albumin (g/dL): 3.1, ISS: Stage II, High-risk cytogenetics: Absent, LDH: Normal) - Signed by  Babara Call, MD on 08/10/2021 Stage prefix: Initial diagnosis Beta 2 microglobulin range (mg/L): Less than 3.5 Albumin range (g/dL): Less than 3.5 Cytogenetics: 1q addition Lactate dehydrogenase (LDH) (U/L): 136 Serum calcium  level: Normal Serum creatinine level: Normal   08/12/2021 Imaging   PET scan showed 1. No evidence of active myeloma  on skull base to thigh FDG PET scan. 2. No soft tissue plasmacytoma.3. No lytic or suspicious lesion on CT portion exam.     08/22/2021 - 01/11/2022 Chemotherapy   Patient is on Treatment Plan : MYELOMA NEWLY DIAGNOSED TRANSPLANT CANDIDATE DaraVRd (Daratumumab  SQ) q21d x 6 Cycles (Induction/Consolidation)     08/24/2021 - 11/09/2021 Chemotherapy   Patient is on Treatment Plan : MYELOMA NEWLY DIAGNOSED TRANSPLANT CANDIDATE DaraVRd (Daratumumab  SQ) q21d x 6 Cycles (Induction/Consolidation)     11/02/2021 Imaging   Bilateral lower extremity US  negative for DVT   11/15/2021 Echocardiogram   1. Left ventricular ejection fraction, by estimation, is 60 to 65%. The left ventricle has normal function. The left ventricle has no regional wall motion abnormalities. Left ventricular diastolic parameters are consistent with Grade II diastolic dysfunction (pseudonormalization). The average left ventricular global longitudinal strain is -17.4 %.   2. Right ventricular systolic function is normal. The right ventricular size is normal. There is mildly elevated pulmonary artery systolic pressure. The estimated right ventricular systolic pressure is 37.7 mmHg.   3. The mitral valve is normal in structure. Mild mitral valve regurgitation. No evidence of mitral stenosis.  4. The aortic valve was not well visualized. Aortic valve regurgitation  is not visualized. No aortic stenosis is present.   5. The inferior vena cava is normal in size with greater than 50% respiratory variability, suggesting right atrial pressure of 3 mmHg.    01/23/2022 Bone Marrow Biopsy   Mildly hypercellular bone marrow (40%) with increased erythropoiesis and 2% plasma cells.    02/15/2022 Procedure   Status post autologous stem cell transplant at [facility] Preparative regimen with Melphalan 200 mg/m2 on 02/14/22 Infusion of stem cells - 6.7 x 10(6) on 02/15/22    06/13/2022 Procedure   Bone marrow biopsy at [facility]  showed: Mildly hypercellular bone marrow (40%) with trilineage hematopoiesis and no increase in plasma cells. MRD pending    06/30/2022 -  Chemotherapy   Started on maintenance Revlimid  10mg  D1-21 Q28 days.     For rheumatoid arthritis, patient was seen by Dr. Dolphus recently and is currently off methotrexate  and Humira .  + Bilateral lower extremity swelling.US  negative for DVT, rade II diastolic dysfunction one Echo  INTERVAL HISTORY Eric Cruz is a 73 y.o. male who has above history reviewed by me today presents for follow up visit for management of multiple myeloma He reports feeling well.  He has been eating healthy, stable weight.  He had another cataract surgery done.  No new complaints   Review of Systems  Constitutional:  Negative for appetite change, chills, fatigue, fever and unexpected weight change.  HENT:   Negative for hearing loss and voice change.   Eyes:  Negative for eye problems and icterus.  Respiratory:  Negative for chest tightness, cough and shortness of breath.   Cardiovascular:  Negative for chest pain and leg swelling.  Gastrointestinal:  Negative for abdominal distention and abdominal pain.  Endocrine: Negative for hot flashes.  Genitourinary:  Negative for difficulty urinating, dysuria and frequency.   Musculoskeletal:  Positive for arthralgias and back pain. Negative for myalgias.  Skin:  Negative for  itching and rash.  Neurological:  Negative for light-headedness and numbness.  Hematological:  Negative for adenopathy. Does not bruise/bleed easily.  Psychiatric/Behavioral:  Negative for confusion.      MEDICAL HISTORY:  Past Medical History:  Diagnosis Date   Arthritis    BPH (benign prostatic hyperplasia)    Cancer (HCC)    Prostate: states he had a positive biopsy but had another one a year later and it was gone.    Depression    Dizziness    GERD (gastroesophageal reflux disease)    Gout    High cholesterol    Hypertension     Sleep apnea    has cpap - not currently wearing   Varicose veins     SURGICAL HISTORY: Past Surgical History:  Procedure Laterality Date   ANTERIOR CERVICAL DECOMP/DISCECTOMY FUSION N/A 02/21/2018   Procedure: Cervical Five-Six Cervical Six-Seven Anterior cervical decompression/discectomy/fusion;  Surgeon: Gillie Duncans, MD;  Location: [facility] OR;  Service: Neurosurgery;  Laterality: N/A;  Cervical Five-Six Cervical Six-Seven Anterior cervical decompression/discectomy/fusion   BIOPSY  10/09/2019   Procedure: BIOPSY;  Surgeon: Saintclair Jasper, MD;  Location: [facility];  Service: Gastroenterology;;   BLADDER SURGERY     CARPAL TUNNEL RELEASE Right 02/11/2019   Procedure: Right Carpal Tunnel Wound Irrigation;  Surgeon: Gillie Duncans, MD;  Location: [facility] OR;  Service: Neurosurgery;  Laterality: Right;  Right Carpal tunnel wound exploration/wash out   CARPAL TUNNEL RELEASE Bilateral    CHOLECYSTECTOMY  11/22/2012   CHOLECYSTECTOMY  11/22/2012   Procedure: LAPAROSCOPIC CHOLECYSTECTOMY;  Surgeon: Lynwood MALVA Pina, MD;  Location: [facility] OR;  Service: General;;   COLONOSCOPY     COLONOSCOPY WITH PROPOFOL  N/A 10/09/2019   Procedure: COLONOSCOPY WITH PROPOFOL ;  Surgeon: Saintclair Jasper, MD;  Location: [facility];  Service: Gastroenterology;  Laterality: N/A;   ESOPHAGOGASTRODUODENOSCOPY (EGD) WITH PROPOFOL  N/A 10/09/2019   Procedure: ESOPHAGOGASTRODUODENOSCOPY (EGD) WITH PROPOFOL ;  Surgeon: Saintclair Jasper, MD;  Location: [facility];  Service: Gastroenterology;  Laterality: N/A;   POLYPECTOMY  10/09/2019   Procedure: POLYPECTOMY;  Surgeon: Saintclair Jasper, MD;  Location: [facility];  Service: Gastroenterology;;   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    SOCIAL HISTORY: Social History   Socioeconomic History   Marital status: Married    Spouse name: Not on file   Number of children: 2   Years of education: Not on file   Highest education level: Not on file  Occupational History   Occupation: Retired-mechanic  Tobacco  Use   Smoking status: Former    Current packs/day: 0.00    Average packs/day: 1 pack/day for 31.0 years (31.0 ttl pk-yrs)    Types: Cigarettes    Start date: 04/13/1967    Quit date: 04/12/1998    Years since quitting: 25.7    Passive exposure: Never   Smokeless tobacco: Never  Vaping Use   Vaping status: Never Used  Substance and Sexual Activity   Alcohol use: No   Drug use: No   Sexual activity: Not on file  Other Topics Concern   Not on file  Social History Narrative   Not on file   Social Drivers of Health   Financial Resource Strain: Not on file  Food Insecurity: Not on file  Transportation Needs: Not on file  Physical Activity: Not on file  Stress: Not on file  Social Connections: Not on file  Intimate Partner Violence: Not on file    FAMILY HISTORY: Family History  Problem Relation Age of Onset   Hypertension Mother  Diabetes Mother    Multiple myeloma Mother    Hypertension Father    Diabetes Father    Healthy Son    Healthy Daughter     ALLERGIES:  is allergic to atorvastatin, simvastatin, other, and statins.  MEDICATIONS:  Current Outpatient Medications  Medication Sig Dispense Refill   acyclovir  (ZOVIRAX ) 800 MG tablet 1 tablet Orally Twice a day; Duration: 10 day(s)     Ascorbic Acid (VITA-C PO) Take 2,000 tablets by mouth daily at 6 (six) AM.     aspirin  EC 81 MG tablet Take 81 mg by mouth daily. Swallow whole.     calcium  carbonate (CALCIUM  600) 600 MG TABS tablet Take 1 tablet (600 mg total) by mouth 3 (three) times daily with meals.     Cyanocobalamin  (B-12) 2500 MCG TABS Take by mouth daily at 6 (six) AM.     Ensure (ENSURE) Take 237 mLs by mouth daily at 6 (six) AM.     Ergocalciferol (VITAMIN D2 PO) Take 1 capsule by mouth daily.     Evolocumab  (REPATHA  SURECLICK) 140 MG/ML SOAJ Inject 140 mg into the skin every 14 (fourteen) days. 2 mL 10   ferrous sulfate 325 (65 FE) MG EC tablet Take 325 mg by mouth daily at 6 (six) AM.     finasteride   (PROSCAR ) 5 MG tablet Take 5 mg by mouth daily.     furosemide  (LASIX ) 40 MG tablet TAKE 1 TABLET BY MOUTH DAILY 90 tablet 2   Multiple Vitamin (MULTIVITAMIN ADULT PO) Take by mouth.     ofloxacin (OCUFLOX) 0.3 % ophthalmic solution INSTILL 1 DROP INTO RIGHT EYE 4 TIMES DAILY FOR 7 DAYS     prednisoLONE acetate (PRED FORTE) 1 % ophthalmic suspension INSTILL 1 DROP INTO RIGHT EYE 4 TIMES A DAY FOR 1 WEEK THEN 2 TIMES A DAY FOR 1 WEEK THEN DAILY FOR 2 WEEKS THEN STOP     tamsulosin  (FLOMAX ) 0.4 MG CAPS capsule Take 0.4 mg by mouth at bedtime.     Turmeric 500 MG CAPS Take by mouth daily at 6 (six) AM.     Vitamin D-Vitamin K (K2 PLUS D3 PO) Take 500 tablets by mouth daily at 6 (six) AM.     Zoledronic  Acid (ZOMETA ) 4 MG/100ML IVPB as directed Intravenous Every 3 months     lenalidomide  (REVLIMID ) 10 MG capsule Take 1 capsule by mouth once daily for 21 days, then 7 days off. 21 capsule 0   No current facility-administered medications for this visit.   Facility-Administered Medications Ordered in Other Visits  Medication Dose Route Frequency Provider Last Rate Last Admin   acetaminophen  (TYLENOL ) 325 MG tablet            dexamethasone  (DECADRON ) 4 MG tablet            diphenhydrAMINE  (BENADRYL ) 25 mg capsule              PHYSICAL EXAMINATION: ECOG PERFORMANCE STATUS: 1 - Symptomatic but completely ambulatory Vitals:   01/07/24 1100 01/07/24 1114  BP: 132/86 120/78  Pulse: 90   Resp: 18   Temp: (!) 97.5 F (36.4 C)   SpO2: 97%    Filed Weights   01/07/24 1100  Weight: 283 lb (128.4 kg)    Physical Exam Constitutional:      General: He is not in acute distress.    Appearance: He is obese.     Comments: Patient walks with a cane.  HENT:     Head: Normocephalic  and atraumatic.  Eyes:     General: No scleral icterus. Cardiovascular:     Rate and Rhythm: Normal rate and regular rhythm.     Heart sounds: Normal heart sounds.  Pulmonary:     Effort: Pulmonary effort is normal.  No respiratory distress.     Breath sounds: No wheezing.  Abdominal:     General: Bowel sounds are normal. There is no distension.     Palpations: Abdomen is soft.  Musculoskeletal:        General: No deformity. Normal range of motion.     Cervical back: Normal range of motion and neck supple.     Comments: Trace edema, bilateral low extremities.   Skin:    General: Skin is warm and dry.     Findings: No erythema or rash.  Neurological:     Mental Status: He is alert and oriented to person, place, and time. Mental status is at baseline.     Cranial Nerves: No cranial nerve deficit.     Coordination: Coordination normal.  Psychiatric:        Mood and Affect: Mood normal.      LABORATORY DATA:  I have reviewed the data as listed    Latest Ref Rng & Units 01/07/2024   10:13 AM 12/05/2023    1:12 PM 11/01/2023   10:25 AM  CBC  WBC 4.0 - 10.5 K/uL 6.3  7.0  5.1   Hemoglobin 13.0 - 17.0 g/dL 87.0  86.9  87.2   Hematocrit 39.0 - 52.0 % 38.4  39.5  38.1   Platelets 150 - 400 K/uL 252  213  256       Latest Ref Rng & Units 01/07/2024   10:12 AM 12/05/2023    1:12 PM 11/01/2023   10:25 AM  CMP  Glucose 70 - 99 mg/dL 887  897  899   BUN 8 - 23 mg/dL 14  18  19    Creatinine 0.61 - 1.24 mg/dL 8.91  8.95  8.89   Sodium 135 - 145 mmol/L 136  135  135   Potassium 3.5 - 5.1 mmol/L 3.8  4.1  4.0   Chloride 98 - 111 mmol/L 104  103  106   CO2 22 - 32 mmol/L 25  24  23    Calcium  8.9 - 10.3 mg/dL 9.3  9.5  9.0   Total Protein 6.5 - 8.1 g/dL 7.4  7.9  7.6   Total Bilirubin 0.0 - 1.2 mg/dL 0.4  0.3  0.4   Alkaline Phos 38 - 126 U/L 75  70  73   AST 15 - 41 U/L 23  19  18    ALT 0 - 44 U/L 8  8  9       RADIOGRAPHIC STUDIES: I have personally reviewed the radiological images as listed and agreed with the findings in the report.  No results found.

## 2024-01-08 LAB — KAPPA/LAMBDA LIGHT CHAINS
Kappa free light chain: 31.4 mg/L — ABNORMAL HIGH (ref 3.3–19.4)
Kappa, lambda light chain ratio: 1.41 (ref 0.26–1.65)
Lambda free light chains: 22.3 mg/L (ref 5.7–26.3)

## 2024-01-10 LAB — MULTIPLE MYELOMA PANEL, SERUM
Albumin SerPl Elph-Mcnc: 3.5 g/dL (ref 2.9–4.4)
Albumin/Glob SerPl: 1.1 (ref 0.7–1.7)
Alpha 1: 0.3 g/dL (ref 0.0–0.4)
Alpha2 Glob SerPl Elph-Mcnc: 0.7 g/dL (ref 0.4–1.0)
B-Globulin SerPl Elph-Mcnc: 1.2 g/dL (ref 0.7–1.3)
Gamma Glob SerPl Elph-Mcnc: 1.3 g/dL (ref 0.4–1.8)
Globulin, Total: 3.5 g/dL (ref 2.2–3.9)
IgA: 282 mg/dL (ref 61–437)
IgG (Immunoglobin G), Serum: 1554 mg/dL (ref 603–1613)
IgM (Immunoglobulin M), Srm: 46 mg/dL (ref 15–143)
Total Protein ELP: 7 g/dL (ref 6.0–8.5)

## 2024-01-23 NOTE — Progress Notes (Signed)
 Office Visit Note  Patient: Eric Cruz             Date of Birth: Jun 06, 1950           MRN: [id number]             PCP: Rexanne Ingle, MD Referring: Rexanne Ingle, MD Visit Date: 02/06/2024 Occupation: Data Unavailable  Subjective:  Tingling in hands and knees  History of Present Illness: Eric Cruz is a 73 y.o. male with seropositive rheumatoid arthritis, osteoarthritis and multiple myeloma.  He returns today after his last visit in May 2025.  He has not had a flare of rheumatoid arthritis since he discontinued Humira  and methotrexate  in May 2023.  He has been experiencing some tingling in his hands and his knees which is unchanged.  He continues to have some difference in his knee joints.  He recently had cataract surgery    Activities of Daily Living:  Patient reports morning stiffness for 0 minutes.   Patient Denies nocturnal pain.  Difficulty dressing/grooming: Denies Difficulty climbing stairs: Denies Difficulty getting out of chair: Denies Difficulty using hands for taps, buttons, cutlery, and/or writing: Denies  Review of Systems  Constitutional:  Positive for fatigue.  HENT:  Negative for mouth sores and mouth dryness.   Eyes:  Negative for dryness.  Respiratory:  Negative for shortness of breath.   Cardiovascular:  Positive for chest pain and palpitations.  Gastrointestinal:  Positive for constipation and diarrhea. Negative for blood in stool.  Endocrine: Negative for increased urination.  Genitourinary:  Negative for involuntary urination.  Musculoskeletal:  Positive for joint pain and joint pain. Negative for gait problem, joint swelling, myalgias, muscle weakness, morning stiffness, muscle tenderness and myalgias.  Skin:  Negative for color change, rash and sensitivity to sunlight.  Allergic/Immunologic: Negative for susceptible to infections.  Neurological:  Positive for numbness. Negative for dizziness and headaches.  Hematological:  Negative for  swollen glands.  Psychiatric/Behavioral:  Positive for sleep disturbance. Negative for depressed mood. The patient is not nervous/anxious.     PMFS History:  Patient Active Problem List   Diagnosis Date Noted   Need for prophylactic vaccination and inoculation against influenza 01/07/2024   Hypokalemia 06/12/2023   Hypocalcemia 06/12/2023   Coronary artery calcification 05/17/2023   Abnormal weight gain 01/11/2022   Constipation 01/11/2022   Epigastric pain 01/11/2022   Morbid obesity (HCC) 12/20/2021   Morbid obesity with BMI of 45.0-49.9, adult (HCC) 11/30/2021   Leg swelling 11/02/2021   Chemotherapy induced diarrhea 11/02/2021   Neuropathy 09/02/2021   Normocytic anemia 09/02/2021   Encounter for antineoplastic chemotherapy 08/24/2021   Chest pain 08/25/2020   Gastroesophageal reflux disease 08/25/2020   Goals of care, counseling/discussion 04/22/2020   Multiple myeloma not having achieved remission (HCC) 04/22/2020   Rheumatoid arthritis (HCC) 04/07/2020   High risk medication use 04/07/2020   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    Depression    Dizziness    GERD (gastroesophageal reflux disease)    Gout    High  cholesterol    Hypertension    Sleep apnea    has cpap - not currently wearing   Varicose veins     Family History  Problem Relation Age of Onset   Hypertension Mother    Diabetes Mother    Multiple myeloma Mother    Hypertension Father    Diabetes Father    Healthy Son    Healthy Daughter    Past Surgical History:  Procedure  Laterality Date   ANTERIOR CERVICAL DECOMP/DISCECTOMY FUSION N/A 02/21/2018   Procedure: Cervical Five-Six Cervical Six-Seven Anterior cervical decompression/discectomy/fusion;  Surgeon: Gillie Duncans, MD;  Location: [facility] OR;  Service: Neurosurgery;  Laterality: N/A;  Cervical Five-Six Cervical Six-Seven Anterior cervical decompression/discectomy/fusion   BIOPSY  10/09/2019   Procedure: BIOPSY;  Surgeon: Saintclair Jasper, MD;  Location: [facility];  Service: Gastroenterology;;   BLADDER SURGERY     CARPAL TUNNEL RELEASE Right 02/11/2019   Procedure: Right Carpal Tunnel Wound Irrigation;  Surgeon: Gillie Duncans, MD;  Location: [facility] OR;  Service: Neurosurgery;  Laterality: Right;  Right Carpal tunnel wound exploration/wash out   CARPAL TUNNEL RELEASE Bilateral    CATARACT EXTRACTION Bilateral    11/2023, 12/2023   CHOLECYSTECTOMY  11/22/2012   CHOLECYSTECTOMY  11/22/2012   Procedure: LAPAROSCOPIC CHOLECYSTECTOMY;  Surgeon: Lynwood MALVA Pina, MD;  Location: [facility] OR;  Service: General;;   COLONOSCOPY     COLONOSCOPY WITH PROPOFOL  N/A 10/09/2019   Procedure: COLONOSCOPY WITH PROPOFOL ;  Surgeon: Saintclair Jasper, MD;  Location: [facility];  Service: Gastroenterology;  Laterality: N/A;   ESOPHAGOGASTRODUODENOSCOPY (EGD) WITH PROPOFOL  N/A 10/09/2019   Procedure: ESOPHAGOGASTRODUODENOSCOPY (EGD) WITH PROPOFOL ;  Surgeon: Saintclair Jasper, MD;  Location: [facility];  Service: Gastroenterology;  Laterality: N/A;   POLYPECTOMY  10/09/2019   Procedure: POLYPECTOMY;  Surgeon: Saintclair Jasper, MD;  Location: [facility];  Service: Gastroenterology;;   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   Social History   Tobacco Use   Smoking status: Former    Current packs/day: 0.00    Average packs/day: 1 pack/day for 31.0 years (31.0 ttl pk-yrs)    Types: Cigarettes    Start date: 04/13/1967    Quit date: 04/12/1998    Years since quitting: 25.8    Passive exposure: Never   Smokeless tobacco: Never  Vaping Use   Vaping status: Never Used   Substance Use Topics   Alcohol use: No   Drug use: No   Social History   Social History Narrative   Not on file     Immunization History  Administered Date(s) Administered   INFLUENZA, HIGH DOSE SEASONAL PF 01/07/2024   Moderna Covid-19 Fall Seasonal Vaccine 36yrs & older 05/30/2022   PFIZER(Purple Top)SARS-COV-2 Vaccination 03/04/2020, 04/05/2020, 09/14/2020     Objective: Vital Signs: BP 122/86   Pulse 80   Temp 97.8 F (36.6 C)   Resp 17   Ht 5' 8 (1.727 m)   Wt 283 lb 6.4 oz (128.5 kg)   BMI 43.09 kg/m    Physical Exam Vitals and nursing note reviewed.  Constitutional:      Appearance: He is well-developed.  HENT:     Head: Normocephalic and atraumatic.  Eyes:     Conjunctiva/sclera: Conjunctivae normal.     Pupils: Pupils are equal, round, and reactive to light.  Cardiovascular:     Rate and Rhythm: Normal rate and regular rhythm.     Heart sounds: Normal heart sounds.  Pulmonary:     Effort: Pulmonary effort is normal.  Breath sounds: Normal breath sounds.  Abdominal:     General: Bowel sounds are normal.     Palpations: Abdomen is soft.  Musculoskeletal:     Cervical back: Normal range of motion and neck supple.  Skin:    General: Skin is warm and dry.     Capillary Refill: Capillary refill takes less than 2 seconds.  Neurological:     Mental Status: He is alert and oriented to person, place, and time.  Psychiatric:        Behavior: Behavior normal.      Musculoskeletal Exam: Patient was examined in the seated position.  He had good range of motion of the cervical spine.  There was no tenderness over thoracic or lumbar spine.  Shoulders, elbows, wrist joints were in good range of motion.  There was no synovitis over MCPs PIPs or DIPs.  Hip joints and knee joints in good range of motion without any warmth swelling or effusion.  There was no tenderness over ankles or MTPs.  CDAI Exam: CDAI Score: -- Patient Global: --; Provider Global:  -- Swollen: --; Tender: -- Joint Exam 02/06/2024   No joint exam has been documented for this visit   There is currently no information documented on the homunculus. Go to the Rheumatology activity and complete the homunculus joint exam.  Investigation: No additional findings.  Imaging: No results found.  Recent Labs: Lab Results  Component Value Date   WBC 6.3 01/07/2024   HGB 12.9 (L) 01/07/2024   PLT 252 01/07/2024   NA 136 01/07/2024   K 3.8 01/07/2024   CL 104 01/07/2024   CO2 25 01/07/2024   GLUCOSE 112 (H) 01/07/2024   BUN 14 01/07/2024   CREATININE 1.08 01/07/2024   BILITOT 0.4 01/07/2024   ALKPHOS 75 01/07/2024   AST 23 01/07/2024   ALT 8 01/07/2024   PROT 7.4 01/07/2024   ALBUMIN 3.8 01/07/2024   CALCIUM  9.3 01/07/2024   GFRAA 104 06/07/2020   QFTBGOLDPLUS NEGATIVE 03/14/2021    Speciality Comments: Start date :Humira  April 2022, methotrexate  January 2022  Procedures:  No procedures performed Allergies: Atorvastatin, Simvastatin, Other, and Statins   Assessment / Plan:     Visit Diagnoses: Rheumatoid arthritis with rheumatoid factor of multiple sites without organ or systems involvement (HCC) - Positive RF, positive anti-CCP, elevated sed rate, synovitis involving multiple joints: Patient had no synovitis on the examination.  He states he has been doing well without any immunosuppressive agents.  No synovitis was noted on the examination.  He has been in remission since he had chemotherapy and stem cell transplant.  I advised him to contact me if he develops a flare.  High risk medication use - Holding Humira  and MTX-since May 2023 while under the care of Dr. Babara for management of multiple myeloma.  He had chemotherapy followed by stem cell transplant.  Arthritis of both acromioclavicular joints-he good range of motion of bilateral shoulders.  History of bilateral carpal tunnel release - he still has intermittent paresthesias.  Chronic pain of both knees  -he complains of intermittent discomfort in his knee joints.  No warmth swelling or effusion was noted.  X-rays obtained at the last visit of bilateral knee joints were unremarkable.  HNP (herniated nucleus pulposus) with myelopathy, cervical - status post decompression, discectomy and fusion 2019.  Multiple myeloma not having achieved remission [facility]) - he is followed by Dr. Esaw had the stem cell transplant in November 2023.  He is on Zovirax  and  Bactrim  prophylaxis.  He is on Revlimid  per Dr. Babara.  Morbid obesity [facility])  Orders: No orders of the defined types were placed in this encounter.  No orders of the defined types were placed in this encounter.    Follow-Up Instructions: Return in about 1 year (around 02/05/2025) for Rheumatoid arthritis.   Maya Nash, MD  Note - This record has been created using Animal nutritionist.  Chart creation errors have been sought, but may not always  have been located. Such creation errors do not reflect on  the standard of medical care.

## 2024-01-28 ENCOUNTER — Other Ambulatory Visit: Payer: Self-pay

## 2024-01-28 DIAGNOSIS — C9000 Multiple myeloma not having achieved remission: Secondary | ICD-10-CM

## 2024-01-28 MED ORDER — LENALIDOMIDE 10 MG PO CAPS: ORAL_CAPSULE | ORAL | 0 refills | Status: AC

## 2024-01-29 ENCOUNTER — Ambulatory Visit: Admitting: Podiatry

## 2024-01-29 ENCOUNTER — Encounter: Payer: Self-pay | Admitting: Podiatry

## 2024-01-29 DIAGNOSIS — M79671 Pain in right foot: Secondary | ICD-10-CM

## 2024-01-29 DIAGNOSIS — B351 Tinea unguium: Secondary | ICD-10-CM

## 2024-01-29 DIAGNOSIS — M79672 Pain in left foot: Secondary | ICD-10-CM | POA: Diagnosis not present

## 2024-01-29 NOTE — Progress Notes (Signed)
 Patient presents for evaluation and treatment of tenderness and some redness around nails feet.  Tenderness around toes with walking and wearing shoes.  Physical exam:  General appearance: Alert, pleasant, and in no acute distress.  Vascular: Pedal pulses: DP 2/4 B/L, PT 0/4 B/L.  Moderate edema lower legs bilaterally  Neurologic:  Dermatologic:  Nails thickened, disfigured, discolored 1-5 BL with subungual debris.  Redness and hypertrophic nail folds along nail folds bilaterally but no signs of drainage or infection.  Musculoskeletal:     Diagnosis: 1. Painful onychomycotic nails 1 through 5 bilaterally. 2. Pain toes 1 through 5 bilaterally.  Plan: -Debrided onychomycotic nails 1 through 5 bilaterally.  Sharply debrided nails with nail clipper and reduced with a power bur.  Return 3 months RFC

## 2024-02-01 ENCOUNTER — Encounter: Payer: Self-pay | Admitting: Oncology

## 2024-02-06 ENCOUNTER — Ambulatory Visit: Attending: Rheumatology | Admitting: Rheumatology

## 2024-02-06 ENCOUNTER — Encounter: Payer: Self-pay | Admitting: Rheumatology

## 2024-02-06 VITALS — BP 122/86 | HR 80 | Temp 97.8°F | Resp 17 | Ht 68.0 in | Wt 283.4 lb

## 2024-02-06 DIAGNOSIS — G8929 Other chronic pain: Secondary | ICD-10-CM

## 2024-02-06 DIAGNOSIS — M19011 Primary osteoarthritis, right shoulder: Secondary | ICD-10-CM | POA: Diagnosis not present

## 2024-02-06 DIAGNOSIS — C9000 Multiple myeloma not having achieved remission: Secondary | ICD-10-CM

## 2024-02-06 DIAGNOSIS — Z79899 Other long term (current) drug therapy: Secondary | ICD-10-CM | POA: Diagnosis not present

## 2024-02-06 DIAGNOSIS — M0579 Rheumatoid arthritis with rheumatoid factor of multiple sites without organ or systems involvement: Secondary | ICD-10-CM

## 2024-02-06 DIAGNOSIS — M25562 Pain in left knee: Secondary | ICD-10-CM

## 2024-02-06 DIAGNOSIS — Z9889 Other specified postprocedural states: Secondary | ICD-10-CM | POA: Diagnosis not present

## 2024-02-06 DIAGNOSIS — M19012 Primary osteoarthritis, left shoulder: Secondary | ICD-10-CM

## 2024-02-06 DIAGNOSIS — M5000 Cervical disc disorder with myelopathy, unspecified cervical region: Secondary | ICD-10-CM

## 2024-02-06 DIAGNOSIS — M25561 Pain in right knee: Secondary | ICD-10-CM

## 2024-02-08 ENCOUNTER — Inpatient Hospital Stay: Attending: Oncology

## 2024-02-08 ENCOUNTER — Inpatient Hospital Stay

## 2024-02-08 ENCOUNTER — Encounter: Payer: Self-pay | Admitting: Oncology

## 2024-02-08 ENCOUNTER — Inpatient Hospital Stay: Admitting: Oncology

## 2024-02-08 VITALS — BP 134/74 | HR 70 | Temp 97.3°F | Resp 18 | Wt 280.7 lb

## 2024-02-08 DIAGNOSIS — Z79899 Other long term (current) drug therapy: Secondary | ICD-10-CM | POA: Diagnosis not present

## 2024-02-08 DIAGNOSIS — Z807 Family history of other malignant neoplasms of lymphoid, hematopoietic and related tissues: Secondary | ICD-10-CM | POA: Insufficient documentation

## 2024-02-08 DIAGNOSIS — M549 Dorsalgia, unspecified: Secondary | ICD-10-CM | POA: Insufficient documentation

## 2024-02-08 DIAGNOSIS — C9000 Multiple myeloma not having achieved remission: Secondary | ICD-10-CM

## 2024-02-08 DIAGNOSIS — M069 Rheumatoid arthritis, unspecified: Secondary | ICD-10-CM | POA: Diagnosis not present

## 2024-02-08 DIAGNOSIS — Z87891 Personal history of nicotine dependence: Secondary | ICD-10-CM | POA: Diagnosis not present

## 2024-02-08 DIAGNOSIS — Z79624 Long term (current) use of inhibitors of nucleotide synthesis: Secondary | ICD-10-CM | POA: Diagnosis not present

## 2024-02-08 DIAGNOSIS — Z7983 Long term (current) use of bisphosphonates: Secondary | ICD-10-CM | POA: Insufficient documentation

## 2024-02-08 DIAGNOSIS — N400 Enlarged prostate without lower urinary tract symptoms: Secondary | ICD-10-CM | POA: Diagnosis not present

## 2024-02-08 DIAGNOSIS — Z7961 Long term (current) use of immunomodulator: Secondary | ICD-10-CM | POA: Insufficient documentation

## 2024-02-08 DIAGNOSIS — Z7982 Long term (current) use of aspirin: Secondary | ICD-10-CM | POA: Diagnosis not present

## 2024-02-08 DIAGNOSIS — G629 Polyneuropathy, unspecified: Secondary | ICD-10-CM

## 2024-02-08 DIAGNOSIS — E7800 Pure hypercholesterolemia, unspecified: Secondary | ICD-10-CM | POA: Insufficient documentation

## 2024-02-08 LAB — CBC WITH DIFFERENTIAL (CANCER CENTER ONLY)
Abs Immature Granulocytes: 0.01 K/uL (ref 0.00–0.07)
Basophils Absolute: 0.1 K/uL (ref 0.0–0.1)
Basophils Relative: 2 %
Eosinophils Absolute: 0.1 K/uL (ref 0.0–0.5)
Eosinophils Relative: 2 %
HCT: 37.4 % — ABNORMAL LOW (ref 39.0–52.0)
Hemoglobin: 12.6 g/dL — ABNORMAL LOW (ref 13.0–17.0)
Immature Granulocytes: 0 %
Lymphocytes Relative: 27 %
Lymphs Abs: 1.3 K/uL (ref 0.7–4.0)
MCH: 32.1 pg (ref 26.0–34.0)
MCHC: 33.7 g/dL (ref 30.0–36.0)
MCV: 95.4 fL (ref 80.0–100.0)
Monocytes Absolute: 0.5 K/uL (ref 0.1–1.0)
Monocytes Relative: 10 %
Neutro Abs: 2.9 K/uL (ref 1.7–7.7)
Neutrophils Relative %: 59 %
Platelet Count: 292 K/uL (ref 150–400)
RBC: 3.92 MIL/uL — ABNORMAL LOW (ref 4.22–5.81)
RDW: 14 % (ref 11.5–15.5)
WBC Count: 4.9 K/uL (ref 4.0–10.5)
nRBC: 0 % (ref 0.0–0.2)

## 2024-02-08 LAB — CMP (CANCER CENTER ONLY)
ALT: 8 U/L (ref 0–44)
AST: 22 U/L (ref 15–41)
Albumin: 3.8 g/dL (ref 3.5–5.0)
Alkaline Phosphatase: 76 U/L (ref 38–126)
Anion gap: 9 (ref 5–15)
BUN: 14 mg/dL (ref 8–23)
CO2: 23 mmol/L (ref 22–32)
Calcium: 8.8 mg/dL — ABNORMAL LOW (ref 8.9–10.3)
Chloride: 103 mmol/L (ref 98–111)
Creatinine: 1.06 mg/dL (ref 0.61–1.24)
GFR, Estimated: >60 mL/min (ref >60)
Glucose, Bld: 93 mg/dL (ref 70–99)
Potassium: 3.7 mmol/L (ref 3.5–5.1)
Sodium: 135 mmol/L (ref 135–145)
Total Bilirubin: 0.5 mg/dL (ref 0.0–1.2)
Total Protein: 7.9 g/dL (ref 6.5–8.1)

## 2024-02-08 MED ORDER — SODIUM CHLORIDE 0.9 % IV SOLN
INTRAVENOUS | Status: DC
  Filled 2024-02-08: qty 250

## 2024-02-08 MED ORDER — ZOLEDRONIC ACID 4 MG/100ML IV SOLN
4.0000 mg | Freq: Once | INTRAVENOUS | Status: AC
  Administered 2024-02-08: 4 mg via INTRAVENOUS
  Filled 2024-02-08: qty 100

## 2024-02-08 NOTE — Assessment & Plan Note (Addendum)
 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  vaccinations per protocol per  [facility] He has been on maintenance therapy with revlimid  10 mg on days 1-21 of 28 day cycle   Labs are reviewed and discussed with patient. Light chain ratio gradually increases.  Patient would like take a break on maintenance treatment.   Zometa   - plan Q3 months to continue to 2 years of therapy [last dose Nov 2025],   Recommend calcium  with vitamin D supplementation.

## 2024-02-08 NOTE — Assessment & Plan Note (Signed)
 Recommend MRI lumbar w wo contrast

## 2024-02-08 NOTE — Patient Instructions (Signed)

## 2024-02-08 NOTE — Assessment & Plan Note (Signed)
 secondary to carpal tunnel disease and chemotherapy Patient did not tolerate gabapentin.  Monitor symptoms-stable

## 2024-02-08 NOTE — Assessment & Plan Note (Signed)
 calcium  600mg  BID Calcium  level has been stable.

## 2024-02-08 NOTE — Progress Notes (Signed)
 Hematology/Oncology Progress note Telephone:([phone]) [phone] Fax:([phone]) [phone]      CHIEF COMPLAINTS/REASON FOR VISIT:  Follow-up for multiple myeloma   ASSESSMENT & PLAN:   Cancer Staging  Multiple myeloma not having achieved remission (HCC) Staging form: Plasma Cell Myeloma and Plasma Cell Disorders, AJCC 8th Edition - Clinical stage from 04/22/2020: RISS Stage II (Beta-2 -microglobulin (mg/L): 2.7, Albumin (g/dL): 3.1, ISS: Stage II, High-risk cytogenetics: Absent, LDH: Normal) - Signed by Babara Call, MD on 08/10/2021   Multiple myeloma not having achieved remission (HCC) 0IgA lambda multiple myeloma, M protein 2.3, normal free light chain ratio Status post Dara Rvd, achieved CR, 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  vaccinations per protocol per  [facility] He has been on maintenance therapy with revlimid  10 mg on days 1-21 of 28 day cycle   Labs are reviewed and discussed with patient. Light chain ratio gradually increases.  Patient would like take a break on maintenance treatment.   Zometa   - plan Q3 months to continue to 2 years of therapy [last dose Nov 2025],   Recommend calcium  with vitamin D supplementation.   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  Monitor symptoms-stable   Back pain Recommend MRI lumbar w wo contrast   Hypocalcemia  calcium  600mg  BID Calcium  level has been stable.     Orders Placed This Encounter  Procedures   MR Lumbar Spine W Wo Contrast    Standing Status:   Future    Expected Date:   02/15/2024    Expiration Date:   02/07/2025    If indicated for the ordered procedure, I authorize the administration of contrast media per Radiology protocol:   Yes     What is the patient's sedation requirement?:   No Sedation    Does the patient have a pacemaker or implanted devices?:   No    Use SRS Protocol?:   Yes    Preferred imaging location?:   [facility] (table limit - 500lbs)   CBC with Differential (Cancer Center Only)    Standing Status:   Future    Expected Date:   03/07/2024    Expiration Date:   06/05/2024   CMP (Cancer Center only)    Standing Status:   Future    Expected Date:   03/07/2024    Expiration Date:   06/05/2024   Kappa/lambda light chains    Standing Status:   Future    Expected Date:   03/07/2024    Expiration Date:   06/05/2024   Multiple Myeloma Panel (SPEP&IFE w/QIG)    Standing Status:   Future    Expected Date:   03/07/2024    Expiration Date:   06/05/2024    Follow up in 4 weeks.  All questions were answered. The patient knows to call the clinic with any problems, questions or concerns.  Call Babara, MD, PhD [facility] Hematology Oncology 02/08/2024    HISTORY OF PRESENTING ILLNESS:  Eric Cruz is a 73 y.o. male presents for follow up of myeloma .  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  27% plasma  cell, cytogenetics - normal, and myeloma FISH panel is negative.  Standard risk.  Congo red staining was added and was negative.  - 07/27/2021, bone marrow biopsy showed hypercellular marrow involved by plasma cell myeloma, CD138 immunohistochemistry plasma cells compromise approximately 70% of the cellular elements and are lambda restricted by light chain in situ hybridization.  Cytogenetics showed duplication of 1q.  Cytogenetics is normal.   04/22/2020 Cancer Staging   Staging form: Plasma Cell Myeloma and Plasma Cell Disorders, AJCC 8th Edition - Clinical stage from 04/22/2020: RISS Stage II (Beta-2 -microglobulin  (mg/L): 2.7, Albumin (g/dL): 3.1, ISS: Stage II, High-risk cytogenetics: Absent, LDH: Normal) - Signed by Babara Call, MD on 08/10/2021 Stage prefix: Initial diagnosis Beta 2 microglobulin range (mg/L): Less than 3.5 Albumin range (g/dL): Less than 3.5 Cytogenetics: 1q addition Lactate dehydrogenase (LDH) (U/L): 136 Serum calcium  level: Normal Serum creatinine level: Normal   08/12/2021 Imaging   PET scan showed 1. No evidence of active myeloma on skull base to thigh FDG PET scan. 2. No soft tissue plasmacytoma.3. No lytic or suspicious lesion on CT portion exam.     08/22/2021 - 01/11/2022 Chemotherapy   Patient is on Treatment Plan : MYELOMA NEWLY DIAGNOSED TRANSPLANT CANDIDATE DaraVRd (Daratumumab  SQ) q21d x 6 Cycles (Induction/Consolidation)     08/24/2021 - 11/09/2021 Chemotherapy   Patient is on Treatment Plan : MYELOMA NEWLY DIAGNOSED TRANSPLANT CANDIDATE DaraVRd (Daratumumab  SQ) q21d x 6 Cycles (Induction/Consolidation)     11/02/2021 Imaging   Bilateral lower extremity US  negative for DVT   11/15/2021 Echocardiogram   1. Left ventricular ejection fraction, by estimation, is 60 to 65%. The left ventricle has normal function. The left ventricle has no regional wall motion abnormalities. Left ventricular diastolic parameters are consistent with Grade II diastolic dysfunction (pseudonormalization). The average left ventricular global longitudinal strain is -17.4 %.   2. Right ventricular systolic function is normal. The right ventricular size is normal. There is mildly elevated pulmonary artery systolic pressure. The estimated right ventricular systolic pressure is 37.7 mmHg.   3. The mitral valve is normal in structure. Mild mitral valve regurgitation. No evidence of mitral stenosis.  4. The aortic valve was not well visualized. Aortic valve regurgitation  is not visualized. No aortic stenosis is present.   5. The inferior vena cava is normal in size with greater than 50% respiratory  variability, suggesting right atrial pressure of 3 mmHg.    01/23/2022 Bone Marrow Biopsy   Mildly hypercellular bone marrow (40%) with increased erythropoiesis and 2% plasma cells.    02/15/2022 Procedure   Status post autologous stem cell transplant at [facility] Preparative regimen with Melphalan 200 mg/m2 on 02/14/22 Infusion of stem cells - 6.7 x 10(6) on 02/15/22    06/13/2022 Procedure   Bone marrow biopsy at [facility] showed: Mildly hypercellular bone marrow (40%) with trilineage hematopoiesis and no increase in plasma cells. MRD pending    06/30/2022 -  Chemotherapy   Started on maintenance Revlimid  10mg  D1-21 Q28 days.     For rheumatoid arthritis, patient was seen by Dr. Dolphus recently and is currently off methotrexate  and Humira .  + Bilateral lower extremity swelling.US  negative for DVT, rade II diastolic dysfunction one Echo  INTERVAL HISTORY RODERICK CALO is a 73 y.o. male who has above history reviewed by me today presents for follow up visit for management of multiple myeloma He reports feeling well.  He has been eating healthy, stable weight.   Patient requests a treatment break.  He reports back pain and feels possibly related to revlimid  side effects  Review of Systems  Constitutional:  Negative for appetite change, chills, fatigue, fever and unexpected weight change.  HENT:   Negative for hearing loss and voice change.   Eyes:  Negative for eye problems and icterus.  Respiratory:  Negative for chest tightness, cough and shortness of breath.   Cardiovascular:  Negative for chest pain and leg swelling.  Gastrointestinal:  Negative for abdominal distention and abdominal pain.  Endocrine: Negative for hot flashes.  Genitourinary:  Negative for difficulty urinating, dysuria and frequency.   Musculoskeletal:  Positive for arthralgias and back pain. Negative for myalgias.  Skin:  Negative for itching and rash.  Neurological:  Negative for  light-headedness and numbness.  Hematological:  Negative for adenopathy. Does not bruise/bleed easily.  Psychiatric/Behavioral:  Negative for confusion.      MEDICAL HISTORY:  Past Medical History:  Diagnosis Date   Arthritis    BPH (benign prostatic hyperplasia)    Cancer (HCC)    Prostate: states he had a positive biopsy but had another one a year later and it was gone.    Depression    Dizziness    GERD (gastroesophageal reflux disease)    Gout    High cholesterol    Hypertension    Sleep apnea    has cpap - not currently wearing   Varicose veins     SURGICAL HISTORY: Past Surgical History:  Procedure Laterality Date   ANTERIOR CERVICAL DECOMP/DISCECTOMY FUSION N/A 02/21/2018   Procedure: Cervical Five-Six Cervical Six-Seven Anterior cervical decompression/discectomy/fusion;  Surgeon: Gillie Duncans, MD;  Location: [facility] OR;  Service: Neurosurgery;  Laterality: N/A;  Cervical Five-Six Cervical Six-Seven Anterior cervical decompression/discectomy/fusion   BIOPSY  10/09/2019   Procedure: BIOPSY;  Surgeon: Saintclair Jasper, MD;  Location: [facility];  Service: Gastroenterology;;   BLADDER SURGERY     CARPAL TUNNEL RELEASE Right 02/11/2019   Procedure: Right Carpal Tunnel Wound Irrigation;  Surgeon: Gillie Duncans, MD;  Location: [facility] OR;  Service: Neurosurgery;  Laterality: Right;  Right Carpal tunnel wound exploration/wash out   CARPAL TUNNEL RELEASE Bilateral    CATARACT EXTRACTION Bilateral    11/2023, 12/2023   CHOLECYSTECTOMY  11/22/2012   CHOLECYSTECTOMY  11/22/2012   Procedure: LAPAROSCOPIC CHOLECYSTECTOMY;  Surgeon: Lynwood MALVA Pina, MD;  Location: [facility] OR;  Service: General;;   COLONOSCOPY     COLONOSCOPY WITH PROPOFOL  N/A 10/09/2019   Procedure: COLONOSCOPY WITH PROPOFOL ;  Surgeon: Saintclair Jasper, MD;  Location: [facility];  Service: Gastroenterology;  Laterality: N/A;   ESOPHAGOGASTRODUODENOSCOPY (EGD) WITH PROPOFOL  N/A 10/09/2019   Procedure: ESOPHAGOGASTRODUODENOSCOPY (EGD)  WITH PROPOFOL ;  Surgeon: Saintclair Jasper, MD;  Location: [facility];  Service: Gastroenterology;  Laterality: N/A;   POLYPECTOMY  10/09/2019   Procedure: POLYPECTOMY;  Surgeon: Saintclair Jasper, MD;  Location: [facility];  Service: Gastroenterology;;   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    SOCIAL HISTORY: Social History   Socioeconomic History   Marital status: Married    Spouse name: Not on file   Number of children: 2   Years of education: Not on file   Highest education level: Not on file  Occupational History   Occupation: Retired-mechanic  Tobacco Use   Smoking status: Former    Current packs/day: 0.00    Average packs/day: 1 pack/day for 31.0 years (31.0 ttl pk-yrs)    Types: Cigarettes    Start date: 04/13/1967    Quit date: 04/12/1998    Years since quitting:  25.8    Passive exposure: Never   Smokeless tobacco: Never  Vaping Use   Vaping status: Never Used  Substance and Sexual Activity   Alcohol use: No   Drug use: No   Sexual activity: Not on file  Other Topics Concern   Not on file  Social History Narrative   Not on file   Social Drivers of Health   Financial Resource Strain: Not on file  Food Insecurity: Not on file  Transportation Needs: Not on file  Physical Activity: Not on file  Stress: Not on file  Social Connections: Not on file  Intimate Partner Violence: Not on file    FAMILY HISTORY: Family History  Problem Relation Age of Onset   Hypertension Mother    Diabetes Mother    Multiple myeloma Mother    Hypertension Father    Diabetes Father    Healthy Son    Healthy Daughter     ALLERGIES:  is allergic to atorvastatin, simvastatin, other, and statins.  MEDICATIONS:  Current Outpatient Medications  Medication Sig Dispense Refill   Ascorbic Acid (VITA-C PO) Take 2,000 tablets by mouth daily at 6 (six) AM.     aspirin  EC 81 MG tablet Take 81 mg by mouth daily. Swallow whole.     calcium  carbonate (CALCIUM  600) 600 MG TABS tablet Take 1 tablet (600  mg total) by mouth 3 (three) times daily with meals.     Cyanocobalamin  (B-12) 2500 MCG TABS Take by mouth daily at 6 (six) AM.     Ergocalciferol (VITAMIN D2 PO) Take 1 capsule by mouth daily.     Evolocumab  (REPATHA  SURECLICK) 140 MG/ML SOAJ Inject 140 mg into the skin every 14 (fourteen) days. 2 mL 10   finasteride  (PROSCAR ) 5 MG tablet Take 5 mg by mouth daily.     furosemide  (LASIX ) 40 MG tablet TAKE 1 TABLET BY MOUTH DAILY 90 tablet 2   tamsulosin  (FLOMAX ) 0.4 MG CAPS capsule Take 0.4 mg by mouth at bedtime.     Zoledronic  Acid (ZOMETA ) 4 MG/100ML IVPB as directed Intravenous Every 3 months     acyclovir  (ZOVIRAX ) 800 MG tablet 1 tablet Orally Twice a day; Duration: 10 day(s) (Patient not taking: Reported on 02/08/2024)     Ensure (ENSURE) Take 237 mLs by mouth daily at 6 (six) AM. (Patient not taking: Reported on 02/08/2024)     ferrous sulfate 325 (65 FE) MG EC tablet Take 325 mg by mouth daily at 6 (six) AM. (Patient not taking: Reported on 02/08/2024)     lenalidomide  (REVLIMID ) 10 MG capsule Take 1 capsule by mouth once daily for 21 days, then 7 days off. (Patient not taking: Reported on 02/08/2024) 21 capsule 0   Multiple Vitamin (MULTIVITAMIN ADULT PO) Take by mouth. (Patient not taking: Reported on 02/08/2024)     ofloxacin (OCUFLOX) 0.3 % ophthalmic solution INSTILL 1 DROP INTO RIGHT EYE 4 TIMES DAILY FOR 7 DAYS (Patient not taking: Reported on 02/08/2024)     prednisoLONE acetate (PRED FORTE) 1 % ophthalmic suspension INSTILL 1 DROP INTO RIGHT EYE 4 TIMES A DAY FOR 1 WEEK THEN 2 TIMES A DAY FOR 1 WEEK THEN DAILY FOR 2 WEEKS THEN STOP (Patient not taking: Reported on 02/08/2024)     Turmeric 500 MG CAPS Take by mouth daily at 6 (six) AM. (Patient not taking: Reported on 02/08/2024)     Vitamin D-Vitamin K (K2 PLUS D3 PO) Take 500 tablets by mouth daily at 6 (six) AM. (Patient  not taking: Reported on 02/08/2024)     No current facility-administered medications for this visit.    Facility-Administered Medications Ordered in Other Visits  Medication Dose Route Frequency Provider Last Rate Last Admin   acetaminophen  (TYLENOL ) 325 MG tablet            dexamethasone  (DECADRON ) 4 MG tablet            diphenhydrAMINE  (BENADRYL ) 25 mg capsule              PHYSICAL EXAMINATION: ECOG PERFORMANCE STATUS: 1 - Symptomatic but completely ambulatory Vitals:   02/08/24 1216  BP: 134/74  Pulse: 70  Resp: 18  Temp: (!) 97.3 F (36.3 C)  SpO2: 99%   Filed Weights   02/08/24 1216  Weight: 280 lb 11.2 oz (127.3 kg)    Physical Exam Constitutional:      General: He is not in acute distress.    Appearance: He is obese.     Comments: Patient walks with a cane.  HENT:     Head: Normocephalic and atraumatic.  Eyes:     General: No scleral icterus. Cardiovascular:     Rate and Rhythm: Normal rate and regular rhythm.     Heart sounds: Normal heart sounds.  Pulmonary:     Effort: Pulmonary effort is normal. No respiratory distress.     Breath sounds: No wheezing.  Abdominal:     General: Bowel sounds are normal. There is no distension.     Palpations: Abdomen is soft.  Musculoskeletal:        General: No deformity. Normal range of motion.     Cervical back: Normal range of motion and neck supple.     Comments: Trace edema, bilateral low extremities.   Skin:    General: Skin is warm and dry.     Findings: No erythema or rash.  Neurological:     Mental Status: He is alert and oriented to person, place, and time. Mental status is at baseline.     Cranial Nerves: No cranial nerve deficit.     Coordination: Coordination normal.  Psychiatric:        Mood and Affect: Mood normal.      LABORATORY DATA:  I have reviewed the data as listed    Latest Ref Rng & Units 02/08/2024   12:10 PM 01/07/2024   10:13 AM 12/05/2023    1:12 PM  CBC  WBC 4.0 - 10.5 K/uL 4.9  6.3  7.0   Hemoglobin 13.0 - 17.0 g/dL 87.3  87.0  86.9   Hematocrit 39.0 - 52.0 % 37.4  38.4  39.5    Platelets 150 - 400 K/uL 292  252  213       Latest Ref Rng & Units 02/08/2024   12:10 PM 01/07/2024   10:12 AM 12/05/2023    1:12 PM  CMP  Glucose 70 - 99 mg/dL 93  887  897   BUN 8 - 23 mg/dL 14  14  18    Creatinine 0.61 - 1.24 mg/dL 8.93  8.91  8.95   Sodium 135 - 145 mmol/L 135  136  135   Potassium 3.5 - 5.1 mmol/L 3.7  3.8  4.1   Chloride 98 - 111 mmol/L 103  104  103   CO2 22 - 32 mmol/L 23  25  24    Calcium  8.9 - 10.3 mg/dL 8.8  9.3  9.5   Total Protein 6.5 - 8.1 g/dL 7.9  7.4  7.9  Total Bilirubin 0.0 - 1.2 mg/dL 0.5  0.4  0.3   Alkaline Phos 38 - 126 U/L 76  75  70   AST 15 - 41 U/L 22  23  19    ALT 0 - 44 U/L 8  8  8       RADIOGRAPHIC STUDIES: I have personally reviewed the radiological images as listed and agreed with the findings in the report.  No results found.

## 2024-02-11 LAB — KAPPA/LAMBDA LIGHT CHAINS
Kappa free light chain: 36.5 mg/L — ABNORMAL HIGH (ref 3.3–19.4)
Kappa, lambda light chain ratio: 1.51 (ref 0.26–1.65)
Lambda free light chains: 24.1 mg/L (ref 5.7–26.3)

## 2024-02-14 LAB — MULTIPLE MYELOMA PANEL, SERUM
Albumin SerPl Elph-Mcnc: 3.5 g/dL (ref 2.9–4.4)
Albumin/Glob SerPl: 1 (ref 0.7–1.7)
Alpha 1: 0.3 g/dL (ref 0.0–0.4)
Alpha2 Glob SerPl Elph-Mcnc: 0.8 g/dL (ref 0.4–1.0)
B-Globulin SerPl Elph-Mcnc: 1.3 g/dL (ref 0.7–1.3)
Gamma Glob SerPl Elph-Mcnc: 1.5 g/dL (ref 0.4–1.8)
Globulin, Total: 3.8 g/dL (ref 2.2–3.9)
IgA: 317 mg/dL (ref 61–437)
IgG (Immunoglobin G), Serum: 1750 mg/dL — ABNORMAL HIGH (ref 603–1613)
IgM (Immunoglobulin M), Srm: 67 mg/dL (ref 15–143)
Total Protein ELP: 7.3 g/dL (ref 6.0–8.5)

## 2024-03-02 ENCOUNTER — Other Ambulatory Visit: Payer: Self-pay | Admitting: Cardiovascular Disease

## 2024-03-07 ENCOUNTER — Inpatient Hospital Stay: Attending: Oncology

## 2024-03-07 ENCOUNTER — Inpatient Hospital Stay: Admitting: Oncology

## 2024-03-07 ENCOUNTER — Encounter: Payer: Self-pay | Admitting: Oncology

## 2024-03-07 VITALS — BP 129/80 | HR 78 | Temp 96.8°F | Resp 18 | Wt 287.2 lb

## 2024-03-07 DIAGNOSIS — C9000 Multiple myeloma not having achieved remission: Secondary | ICD-10-CM

## 2024-03-07 DIAGNOSIS — G8929 Other chronic pain: Secondary | ICD-10-CM

## 2024-03-07 DIAGNOSIS — G629 Polyneuropathy, unspecified: Secondary | ICD-10-CM | POA: Insufficient documentation

## 2024-03-07 DIAGNOSIS — Z87891 Personal history of nicotine dependence: Secondary | ICD-10-CM | POA: Diagnosis not present

## 2024-03-07 DIAGNOSIS — Z5111 Encounter for antineoplastic chemotherapy: Secondary | ICD-10-CM | POA: Diagnosis not present

## 2024-03-07 DIAGNOSIS — M545 Low back pain, unspecified: Secondary | ICD-10-CM | POA: Diagnosis not present

## 2024-03-07 LAB — CBC WITH DIFFERENTIAL (CANCER CENTER ONLY)
Abs Immature Granulocytes: 0.03 K/uL (ref 0.00–0.07)
Basophils Absolute: 0 K/uL (ref 0.0–0.1)
Basophils Relative: 0 %
Eosinophils Absolute: 0.2 K/uL (ref 0.0–0.5)
Eosinophils Relative: 3 %
HCT: 37.9 % — ABNORMAL LOW (ref 39.0–52.0)
Hemoglobin: 12.6 g/dL — ABNORMAL LOW (ref 13.0–17.0)
Immature Granulocytes: 1 %
Lymphocytes Relative: 20 %
Lymphs Abs: 1.4 K/uL (ref 0.7–4.0)
MCH: 31.8 pg (ref 26.0–34.0)
MCHC: 33.2 g/dL (ref 30.0–36.0)
MCV: 95.7 fL (ref 80.0–100.0)
Monocytes Absolute: 0.5 K/uL (ref 0.1–1.0)
Monocytes Relative: 8 %
Neutro Abs: 4.5 K/uL (ref 1.7–7.7)
Neutrophils Relative %: 68 %
Platelet Count: 201 K/uL (ref 150–400)
RBC: 3.96 MIL/uL — ABNORMAL LOW (ref 4.22–5.81)
RDW: 14.3 % (ref 11.5–15.5)
WBC Count: 6.6 K/uL (ref 4.0–10.5)
nRBC: 0 % (ref 0.0–0.2)

## 2024-03-07 LAB — CMP (CANCER CENTER ONLY)
ALT: 6 U/L (ref 0–44)
AST: 19 U/L (ref 15–41)
Albumin: 4.1 g/dL (ref 3.5–5.0)
Alkaline Phosphatase: 84 U/L (ref 38–126)
Anion gap: 10 (ref 5–15)
BUN: 15 mg/dL (ref 8–23)
CO2: 25 mmol/L (ref 22–32)
Calcium: 9.7 mg/dL (ref 8.9–10.3)
Chloride: 103 mmol/L (ref 98–111)
Creatinine: 1.05 mg/dL (ref 0.61–1.24)
GFR, Estimated: >60 mL/min (ref >60)
Glucose, Bld: 99 mg/dL (ref 70–99)
Potassium: 4.5 mmol/L (ref 3.5–5.1)
Sodium: 137 mmol/L (ref 135–145)
Total Bilirubin: <0.2 mg/dL (ref 0.0–1.2)
Total Protein: 7.5 g/dL (ref 6.5–8.1)

## 2024-03-07 NOTE — Assessment & Plan Note (Signed)
 secondary to carpal tunnel disease and chemotherapy Patient did not tolerate gabapentin.  Monitor symptoms-stable

## 2024-03-07 NOTE — Progress Notes (Signed)
 Hematology/Oncology Progress note [phone] [phone]      CHIEF COMPLAINTS/REASON FOR VISIT:  Follow-up for multiple myeloma   ASSESSMENT & PLAN:   Cancer Staging  Multiple myeloma not having achieved remission (HCC) Staging form: Plasma Cell Myeloma and Plasma Cell Disorders, AJCC 8th Edition - Clinical stage from 04/22/2020: RISS Stage II (Beta-2 -microglobulin (mg/L): 2.7, Albumin (g/dL): 3.1, ISS: Stage II, High-risk cytogenetics: Absent, LDH: Normal) - Signed by Babara Call, MD on 08/10/2021   Multiple myeloma not having achieved remission (HCC) 0IgA lambda multiple myeloma, M protein 2.3, normal free light chain ratio Status post Dara Rvd, achieved CR, 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  vaccinations per protocol per  [facility] Previously on maintenance therapy with revlimid  10 mg on days 1-21 of 28 day cycle   Labs are reviewed and discussed with patient. Light chain ratio gradually increases.  Patient desires a break on maintenance treatment and has been off treatment since Nov 2025.   S/p 2 years of Zometa   - Q3 months [last dose Nov 2025],   Recommend calcium  with vitamin D supplementation.   Encounter for antineoplastic chemotherapy Chemotherapy plan as listed above.  Back pain Previously recommended him to get MRI lumbar w wo contrast. 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  Monitor symptoms-stable   Hypocalcemia  calcium  600mg  BID Calcium  level has been stable.     Orders Placed This Encounter  Procedures   CBC with Differential (Cancer Center Only)    Standing Status:   Future     Expected Date:   06/05/2024    Expiration Date:   09/03/2024   CMP (Cancer Center only)    Standing Status:   Future    Expected Date:   06/05/2024    Expiration Date:   09/03/2024   Multiple Myeloma Panel (SPEP&IFE w/QIG)    Standing Status:   Future    Expected Date:   06/05/2024    Expiration Date:   09/03/2024   Kappa/lambda light chains    Standing Status:   Future    Expected Date:   06/05/2024    Expiration Date:   09/03/2024    Follow up in 4 weeks.  All questions were answered. The patient knows to call the clinic with any problems, questions or concerns.  Call Babara, MD, PhD [facility] Oncology 03/07/2024    HISTORY OF PRESENTING ILLNESS:  Eric Cruz is a 73 y.o. male presents for follow up of myeloma .  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  27% plasma cell, cytogenetics - normal, and myeloma FISH panel is negative.  Standard risk.  Congo red staining was added and was negative.  - 07/27/2021, bone marrow biopsy showed hypercellular marrow involved by plasma cell myeloma, CD138 immunohistochemistry plasma cells compromise approximately 70% of the cellular elements and are lambda restricted by light chain in situ hybridization.  Cytogenetics showed duplication of 1q.  Cytogenetics is normal.   04/22/2020 Cancer Staging   Staging form: Plasma Cell Myeloma and Plasma Cell  Disorders, AJCC 8th Edition - Clinical stage from 04/22/2020: RISS Stage II (Beta-2 -microglobulin (mg/L): 2.7, Albumin (g/dL): 3.1, ISS: Stage II, High-risk cytogenetics: Absent, LDH: Normal) - Signed by Babara Call, MD on 08/10/2021 Stage prefix: Initial diagnosis Beta 2 microglobulin range (mg/L): Less than 3.5 Albumin range (g/dL): Less than 3.5 Cytogenetics: 1q addition Lactate dehydrogenase (LDH) (U/L):  136 Serum calcium  level: Normal Serum creatinine level: Normal   08/12/2021 Imaging   PET scan showed 1. No evidence of active myeloma on skull base to thigh FDG PET scan. 2. No soft tissue plasmacytoma.3. No lytic or suspicious lesion on CT portion exam.     08/22/2021 - 01/11/2022 Chemotherapy   Patient is on Treatment Plan : MYELOMA NEWLY DIAGNOSED TRANSPLANT CANDIDATE DaraVRd (Daratumumab  SQ) q21d x 6 Cycles (Induction/Consolidation)     08/24/2021 - 11/09/2021 Chemotherapy   Patient is on Treatment Plan : MYELOMA NEWLY DIAGNOSED TRANSPLANT CANDIDATE DaraVRd (Daratumumab  SQ) q21d x 6 Cycles (Induction/Consolidation)     11/02/2021 Imaging   Bilateral lower extremity US  negative for DVT   11/15/2021 Echocardiogram   1. Left ventricular ejection fraction, by estimation, is 60 to 65%. The left ventricle has normal function. The left ventricle has no regional wall motion abnormalities. Left ventricular diastolic parameters are consistent with Grade II diastolic dysfunction (pseudonormalization). The average left ventricular global longitudinal strain is -17.4 %.   2. Right ventricular systolic function is normal. The right ventricular size is normal. There is mildly elevated pulmonary artery systolic pressure. The estimated right ventricular systolic pressure is 37.7 mmHg.   3. The mitral valve is normal in structure. Mild mitral valve regurgitation. No evidence of mitral stenosis.  4. The aortic valve was not well visualized. Aortic valve regurgitation  is not visualized. No aortic stenosis is present.   5. The inferior vena cava is normal in size with greater than 50% respiratory variability, suggesting right atrial pressure of 3 mmHg.    01/23/2022 Bone Marrow Biopsy   Mildly hypercellular bone marrow (40%) with increased erythropoiesis and 2% plasma cells.    02/15/2022 Procedure   Status post autologous stem cell transplant at [facility] Preparative regimen with  Melphalan 200 mg/m2 on 02/14/22 Infusion of stem cells - 6.7 x 10(6) on 02/15/22    06/13/2022 Procedure   Bone marrow biopsy at [facility] showed: Mildly hypercellular bone marrow (40%) with trilineage hematopoiesis and no increase in plasma cells. MRD pending    06/30/2022 -  Chemotherapy   Started on maintenance Revlimid  10mg  D1-21 Q28 days.     For rheumatoid arthritis, patient was seen by Dr. Dolphus recently and is currently off methotrexate  and Humira .  + Bilateral lower extremity swelling.US  negative for DVT, rade II diastolic dysfunction one Echo  INTERVAL HISTORY DAEQUAN KOZMA is a 73 y.o. male who has above history reviewed by me today presents for follow up visit for management of multiple myeloma He reports feeling well.  He has been eating healthy, stable weight.   Patient requests a treatment break. He reports back pain and feels possibly related to revlimid  side effects  Review of Systems  Constitutional:  Negative for appetite change, chills, fatigue, fever and unexpected weight change.  HENT:   Negative for hearing loss and voice change.   Eyes:  Negative for eye problems and icterus.  Respiratory:  Negative for chest tightness, cough and shortness of breath.   Cardiovascular:  Negative for chest pain and leg swelling.  Gastrointestinal:  Negative for abdominal distention and  abdominal pain.  Endocrine: Negative for hot flashes.  Genitourinary:  Negative for difficulty urinating, dysuria and frequency.   Musculoskeletal:  Positive for arthralgias. Negative for myalgias.  Skin:  Negative for itching and rash.  Neurological:  Negative for light-headedness and numbness.  Hematological:  Negative for adenopathy. Does not bruise/bleed easily.  Psychiatric/Behavioral:  Negative for confusion.      MEDICAL HISTORY:  Past Medical History:  Diagnosis Date   Arthritis    BPH (benign prostatic hyperplasia)    Cancer (HCC)    Prostate: states he had a positive biopsy  but had another one a year later and it was gone.    Depression    Dizziness    GERD (gastroesophageal reflux disease)    Gout    High cholesterol    Hypertension    Sleep apnea    has cpap - not currently wearing   Varicose veins     SURGICAL HISTORY: Past Surgical History:  Procedure Laterality Date   ANTERIOR CERVICAL DECOMP/DISCECTOMY FUSION N/A 02/21/2018   Procedure: Cervical Five-Six Cervical Six-Seven Anterior cervical decompression/discectomy/fusion;  Surgeon: Gillie Duncans, MD;  Location: [facility] OR;  Service: Neurosurgery;  Laterality: N/A;  Cervical Five-Six Cervical Six-Seven Anterior cervical decompression/discectomy/fusion   BIOPSY  10/09/2019   Procedure: BIOPSY;  Surgeon: Saintclair Jasper, MD;  Location: [facility];  Service: Gastroenterology;;   BLADDER SURGERY     CARPAL TUNNEL RELEASE Right 02/11/2019   Procedure: Right Carpal Tunnel Wound Irrigation;  Surgeon: Gillie Duncans, MD;  Location: [facility] OR;  Service: Neurosurgery;  Laterality: Right;  Right Carpal tunnel wound exploration/wash out   CARPAL TUNNEL RELEASE Bilateral    CATARACT EXTRACTION Bilateral    11/2023, 12/2023   CHOLECYSTECTOMY  11/22/2012   CHOLECYSTECTOMY  11/22/2012   Procedure: LAPAROSCOPIC CHOLECYSTECTOMY;  Surgeon: Lynwood MALVA Pina, MD;  Location: [facility] OR;  Service: General;;   COLONOSCOPY     COLONOSCOPY WITH PROPOFOL  N/A 10/09/2019   Procedure: COLONOSCOPY WITH PROPOFOL ;  Surgeon: Saintclair Jasper, MD;  Location: [facility];  Service: Gastroenterology;  Laterality: N/A;   ESOPHAGOGASTRODUODENOSCOPY (EGD) WITH PROPOFOL  N/A 10/09/2019   Procedure: ESOPHAGOGASTRODUODENOSCOPY (EGD) WITH PROPOFOL ;  Surgeon: Saintclair Jasper, MD;  Location: [facility];  Service: Gastroenterology;  Laterality: N/A;   POLYPECTOMY  10/09/2019   Procedure: POLYPECTOMY;  Surgeon: Saintclair Jasper, MD;  Location: [facility];  Service: Gastroenterology;;   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    SOCIAL HISTORY: Social History   Socioeconomic  History   Marital status: Married    Spouse name: Not on file   Number of children: 2   Years of education: Not on file   Highest education level: Not on file  Occupational History   Occupation: Retired-mechanic  Tobacco Use   Smoking status: Former    Current packs/day: 0.00    Average packs/day: 1 pack/day for 31.0 years (31.0 ttl pk-yrs)    Types: Cigarettes    Start date: 04/13/1967    Quit date: 04/12/1998    Years since quitting: 25.9    Passive exposure: Never   Smokeless tobacco: Never  Vaping Use   Vaping status: Never Used  Substance and Sexual Activity   Alcohol use: No   Drug use: No   Sexual activity: Not on file  Other Topics Concern   Not on file  Social History Narrative   Not on file   Social Drivers of Health   Financial Resource Strain: Not on file  Food Insecurity: Not on file  Transportation Needs: Not  on file  Physical Activity: Not on file  Stress: Not on file  Social Connections: Not on file  Intimate Partner Violence: Not on file    FAMILY HISTORY: Family History  Problem Relation Age of Onset   Hypertension Mother    Diabetes Mother    Multiple myeloma Mother    Hypertension Father    Diabetes Father    Healthy Son    Healthy Daughter     ALLERGIES:  is allergic to atorvastatin, simvastatin, other, and statins.  MEDICATIONS:  Current Outpatient Medications  Medication Sig Dispense Refill   Ascorbic Acid (VITA-C PO) Take 2,000 tablets by mouth daily at 6 (six) AM.     aspirin  EC 81 MG tablet Take 81 mg by mouth daily. Swallow whole.     calcium  carbonate (CALCIUM  600) 600 MG TABS tablet Take 1 tablet (600 mg total) by mouth 3 (three) times daily with meals.     Cyanocobalamin  (B-12) 2500 MCG TABS Take by mouth daily at 6 (six) AM.     Ensure (ENSURE) Take 237 mLs by mouth daily at 6 (six) AM.     Ergocalciferol (VITAMIN D2 PO) Take 1 capsule by mouth daily.     Evolocumab  (REPATHA  SURECLICK) 140 MG/ML SOAJ Inject 140 mg into the  skin every 14 (fourteen) days. 2 mL 10   finasteride  (PROSCAR ) 5 MG tablet Take 5 mg by mouth daily.     furosemide  (LASIX ) 40 MG tablet TAKE 1 TABLET BY MOUTH DAILY 90 tablet 2   magnesium  oxide (MAG-OX) 400 (240 Mg) MG tablet Take 400 mg by mouth daily.     tamsulosin  (FLOMAX ) 0.4 MG CAPS capsule Take 0.4 mg by mouth at bedtime.     Zoledronic  Acid (ZOMETA ) 4 MG/100ML IVPB as directed Intravenous Every 3 months     acyclovir  (ZOVIRAX ) 800 MG tablet 1 tablet Orally Twice a day; Duration: 10 day(s) (Patient not taking: Reported on 03/07/2024)     ferrous sulfate 325 (65 FE) MG EC tablet Take 325 mg by mouth daily at 6 (six) AM. (Patient not taking: Reported on 03/07/2024)     lenalidomide  (REVLIMID ) 10 MG capsule Take 1 capsule by mouth once daily for 21 days, then 7 days off. (Patient not taking: Reported on 03/07/2024) 21 capsule 0   Multiple Vitamin (MULTIVITAMIN ADULT PO) Take by mouth. (Patient not taking: Reported on 03/07/2024)     ofloxacin (OCUFLOX) 0.3 % ophthalmic solution INSTILL 1 DROP INTO RIGHT EYE 4 TIMES DAILY FOR 7 DAYS (Patient not taking: Reported on 03/07/2024)     prednisoLONE acetate (PRED FORTE) 1 % ophthalmic suspension INSTILL 1 DROP INTO RIGHT EYE 4 TIMES A DAY FOR 1 WEEK THEN 2 TIMES A DAY FOR 1 WEEK THEN DAILY FOR 2 WEEKS THEN STOP (Patient not taking: Reported on 03/07/2024)     Turmeric 500 MG CAPS Take by mouth daily at 6 (six) AM. (Patient not taking: Reported on 03/07/2024)     Vitamin D-Vitamin K (K2 PLUS D3 PO) Take 500 tablets by mouth daily at 6 (six) AM. (Patient not taking: Reported on 03/07/2024)     No current facility-administered medications for this visit.   Facility-Administered Medications Ordered in Other Visits  Medication Dose Route Frequency Provider Last Rate Last Admin   acetaminophen  (TYLENOL ) 325 MG tablet            dexamethasone  (DECADRON ) 4 MG tablet            diphenhydrAMINE  (BENADRYL ) 25 mg capsule  PHYSICAL EXAMINATION: ECOG  PERFORMANCE STATUS: 1 - Symptomatic but completely ambulatory Vitals:   03/07/24 1131 03/07/24 1146  BP: 129/84 129/80  Pulse: 78   Resp: 18   Temp: (!) 96.8 F (36 C)   SpO2: 98%    Filed Weights   03/07/24 1131  Weight: 287 lb 3.2 oz (130.3 kg)    Physical Exam Constitutional:      General: He is not in acute distress.    Appearance: He is obese.     Comments: Patient walks with a cane.  HENT:     Head: Normocephalic and atraumatic.  Eyes:     General: No scleral icterus. Cardiovascular:     Rate and Rhythm: Normal rate and regular rhythm.     Heart sounds: Normal heart sounds.  Pulmonary:     Effort: Pulmonary effort is normal. No respiratory distress.     Breath sounds: No wheezing.  Abdominal:     General: Bowel sounds are normal. There is no distension.     Palpations: Abdomen is soft.  Musculoskeletal:        General: No deformity. Normal range of motion.     Cervical back: Normal range of motion and neck supple.     Comments: Trace edema, bilateral low extremities.   Skin:    General: Skin is warm and dry.     Findings: No erythema or rash.  Neurological:     Mental Status: He is alert and oriented to person, place, and time. Mental status is at baseline.     Cranial Nerves: No cranial nerve deficit.     Coordination: Coordination normal.  Psychiatric:        Mood and Affect: Mood normal.      LABORATORY DATA:  I have reviewed the data as listed    Latest Ref Rng & Units 03/07/2024   11:18 AM 02/08/2024   12:10 PM 01/07/2024   10:13 AM  CBC  WBC 4.0 - 10.5 K/uL 6.6  4.9  6.3   Hemoglobin 13.0 - 17.0 g/dL 87.3  87.3  87.0   Hematocrit 39.0 - 52.0 % 37.9  37.4  38.4   Platelets 150 - 400 K/uL 201  292  252       Latest Ref Rng & Units 03/07/2024   11:18 AM 02/08/2024   12:10 PM 01/07/2024   10:12 AM  CMP  Glucose 70 - 99 mg/dL 99  93  887   BUN 8 - 23 mg/dL 15  14  14    Creatinine 0.61 - 1.24 mg/dL 8.94  8.93  8.91   Sodium 135 - 145 mmol/L 137   135  136   Potassium 3.5 - 5.1 mmol/L 4.5  3.7  3.8   Chloride 98 - 111 mmol/L 103  103  104   CO2 22 - 32 mmol/L 25  23  25    Calcium  8.9 - 10.3 mg/dL 9.7  8.8  9.3   Total Protein 6.5 - 8.1 g/dL 7.5  7.9  7.4   Total Bilirubin 0.0 - 1.2 mg/dL <9.7  0.5  0.4   Alkaline Phos 38 - 126 U/L 84  76  75   AST 15 - 41 U/L 19  22  23    ALT 0 - 44 U/L 6  8  8       RADIOGRAPHIC STUDIES: I have personally reviewed the radiological images as listed and agreed with the findings in the report.  No results found.

## 2024-03-07 NOTE — Assessment & Plan Note (Signed)
 Previously recommended him to get MRI lumbar w wo contrast. He would like to defer due to improvement of symptoms

## 2024-03-07 NOTE — Assessment & Plan Note (Signed)
 calcium  600mg  BID Calcium  level has been stable.

## 2024-03-07 NOTE — Assessment & Plan Note (Addendum)
 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  vaccinations per protocol per  [facility] Previously on maintenance therapy with revlimid  10 mg on days 1-21 of 28 day cycle   Labs are reviewed and discussed with patient. Light chain ratio gradually increases.  Patient desires a break on maintenance treatment and has been off treatment since Nov 2025.   S/p 2 years of Zometa   - Q3 months [last dose Nov 2025],   Recommend calcium  with vitamin D supplementation.

## 2024-03-07 NOTE — Assessment & Plan Note (Signed)
 Chemotherapy plan as listed above

## 2024-03-10 LAB — MULTIPLE MYELOMA PANEL, SERUM
Albumin SerPl Elph-Mcnc: 3.7 g/dL (ref 2.9–4.4)
Albumin/Glob SerPl: 1.1 (ref 0.7–1.7)
Alpha 1: 0.2 g/dL (ref 0.0–0.4)
Alpha2 Glob SerPl Elph-Mcnc: 0.6 g/dL (ref 0.4–1.0)
B-Globulin SerPl Elph-Mcnc: 1.2 g/dL (ref 0.7–1.3)
Gamma Glob SerPl Elph-Mcnc: 1.4 g/dL (ref 0.4–1.8)
Globulin, Total: 3.4 g/dL (ref 2.2–3.9)
IgA: 273 mg/dL (ref 61–437)
IgG (Immunoglobin G), Serum: 1452 mg/dL (ref 603–1613)
IgM (Immunoglobulin M), Srm: 49 mg/dL (ref 15–143)
Total Protein ELP: 7.1 g/dL (ref 6.0–8.5)

## 2024-03-10 LAB — KAPPA/LAMBDA LIGHT CHAINS
Kappa free light chain: 26.4 mg/L — ABNORMAL HIGH (ref 3.3–19.4)
Kappa, lambda light chain ratio: 1.6 (ref 0.26–1.65)
Lambda free light chains: 16.5 mg/L (ref 5.7–26.3)

## 2024-03-25 ENCOUNTER — Ambulatory Visit: Admitting: Podiatry

## 2024-03-25 ENCOUNTER — Encounter: Payer: Self-pay | Admitting: Podiatry

## 2024-03-25 VITALS — Ht 68.0 in | Wt 287.0 lb

## 2024-03-25 DIAGNOSIS — M79671 Pain in right foot: Secondary | ICD-10-CM

## 2024-03-25 DIAGNOSIS — M79672 Pain in left foot: Secondary | ICD-10-CM | POA: Diagnosis not present

## 2024-03-25 DIAGNOSIS — B351 Tinea unguium: Secondary | ICD-10-CM | POA: Diagnosis not present

## 2024-03-25 DIAGNOSIS — M25571 Pain in right ankle and joints of right foot: Secondary | ICD-10-CM | POA: Diagnosis not present

## 2024-03-25 MED ORDER — DEXAMETHASONE SODIUM PHOSPHATE 120 MG/30ML IJ SOLN
2.0000 mg | Freq: Once | INTRAMUSCULAR | Status: AC
  Administered 2024-03-25: 2 mg

## 2024-03-25 NOTE — Progress Notes (Signed)
 Patient presents for evaluation and treatment of tenderness and some redness around nails feet.  Tenderness around toes with walking and wearing shoes.  Extreme tenderness to the distal aspect of the second toe around the DIPJ and the distal toe.  Pain and throbs even when sleeping.  Does not or any injury to it.  Has not noticed any redness ecchymosis or swelling in the toe.  Physical exam:  General appearance: Alert, pleasant, and in no acute distress.  Vascular: Pedal pulses: DP 2/4 B/L, PT 0/4 B/L. Moderate edema lower legs bilaterally.  Capillary refill time immediate bilaterally  Neurologic:  Dermatologic:  Nails thickened, disfigured, discolored 1-5 BL with subungual debris.  Redness and hypertrophic nail folds along nail folds bilaterally but no signs of drainage or infection.  Musculoskeletal:  Painful hammertoe deformity second toe right with tenderness at the DIPJ.  Tenderness with range of motion DIPJ.   Diagnosis: 1. Painful onychomycotic nails 1 through 5 bilaterally. 2. Pain toes 1 through 5 bilaterally.  Plan: -injected 1.0cc mixture of 0.5cc 0.5% Marcaine  and  0.5cc Dexamethasone  sodium phosphate  USP at DIPJ second toe right. -Dispensed crescent pad right foot  -Debrided onychomycotic nails 1 through 5 bilaterally.  Sharply debrided nails with nail clipper and reduced with a power bur.  Return 3 months RFC

## 2024-03-31 ENCOUNTER — Ambulatory Visit: Admitting: Podiatry

## 2024-05-26 ENCOUNTER — Ambulatory Visit: Admitting: Podiatry

## 2024-06-02 ENCOUNTER — Inpatient Hospital Stay

## 2024-06-11 ENCOUNTER — Inpatient Hospital Stay: Admitting: Oncology

## 2025-02-09 ENCOUNTER — Ambulatory Visit: Admitting: Rheumatology
